# Patient Record
Sex: Female | Born: 1950 | Race: White | Hispanic: Yes | Marital: Married | Smoking: Never smoker
Health system: Southern US, Community
[De-identification: ages and names within clinical notes are randomized; demographics above are authoritative.]

## PROBLEM LIST (undated history)

## (undated) DIAGNOSIS — E119 Type 2 diabetes mellitus without complications: Secondary | ICD-10-CM

## (undated) DIAGNOSIS — I10 Essential (primary) hypertension: Secondary | ICD-10-CM

## (undated) DIAGNOSIS — N289 Disorder of kidney and ureter, unspecified: Secondary | ICD-10-CM

## (undated) DIAGNOSIS — F0390 Unspecified dementia without behavioral disturbance: Secondary | ICD-10-CM

## (undated) HISTORY — PX: ABDOMINAL HYSTERECTOMY: SHX81

## (undated) HISTORY — PX: CHOLECYSTECTOMY: SHX55

## (undated) HISTORY — PX: NEPHRECTOMY TRANSPLANTED ORGAN: SUR880

---

## 2012-03-25 ENCOUNTER — Inpatient Hospital Stay (HOSPITAL_COMMUNITY)
Admission: EM | Admit: 2012-03-25 | Discharge: 2012-03-28 | DRG: 865 | Disposition: A | Discharge status: Home / Self Care | Attending: Internal Medicine | Admitting: Internal Medicine

## 2012-03-25 ENCOUNTER — Encounter (HOSPITAL_COMMUNITY): Payer: Self-pay | Admitting: Emergency Medicine

## 2012-03-25 ENCOUNTER — Emergency Department (HOSPITAL_COMMUNITY)

## 2012-03-25 DIAGNOSIS — R197 Diarrhea, unspecified: Secondary | ICD-10-CM

## 2012-03-25 DIAGNOSIS — A419 Sepsis, unspecified organism: Secondary | ICD-10-CM

## 2012-03-25 DIAGNOSIS — E785 Hyperlipidemia, unspecified: Secondary | ICD-10-CM | POA: Diagnosis present

## 2012-03-25 DIAGNOSIS — I10 Essential (primary) hypertension: Secondary | ICD-10-CM

## 2012-03-25 DIAGNOSIS — Z79899 Other long term (current) drug therapy: Secondary | ICD-10-CM

## 2012-03-25 DIAGNOSIS — A088 Other specified intestinal infections: Secondary | ICD-10-CM | POA: Diagnosis present

## 2012-03-25 DIAGNOSIS — A0472 Enterocolitis due to Clostridium difficile, not specified as recurrent: Secondary | ICD-10-CM | POA: Diagnosis present

## 2012-03-25 DIAGNOSIS — Z90710 Acquired absence of both cervix and uterus: Secondary | ICD-10-CM

## 2012-03-25 DIAGNOSIS — B9789 Other viral agents as the cause of diseases classified elsewhere: Principal | ICD-10-CM | POA: Diagnosis present

## 2012-03-25 DIAGNOSIS — Z794 Long term (current) use of insulin: Secondary | ICD-10-CM

## 2012-03-25 DIAGNOSIS — Z940 Kidney transplant status: Secondary | ICD-10-CM

## 2012-03-25 DIAGNOSIS — R031 Nonspecific low blood-pressure reading: Secondary | ICD-10-CM | POA: Diagnosis present

## 2012-03-25 DIAGNOSIS — E119 Type 2 diabetes mellitus without complications: Secondary | ICD-10-CM

## 2012-03-25 DIAGNOSIS — F0390 Unspecified dementia without behavioral disturbance: Secondary | ICD-10-CM | POA: Diagnosis present

## 2012-03-25 DIAGNOSIS — R509 Fever, unspecified: Secondary | ICD-10-CM

## 2012-03-25 DIAGNOSIS — A084 Viral intestinal infection, unspecified: Secondary | ICD-10-CM | POA: Diagnosis present

## 2012-03-25 DIAGNOSIS — R112 Nausea with vomiting, unspecified: Secondary | ICD-10-CM

## 2012-03-25 DIAGNOSIS — Z9089 Acquired absence of other organs: Secondary | ICD-10-CM

## 2012-03-25 HISTORY — DX: Essential (primary) hypertension: I10

## 2012-03-25 HISTORY — DX: Type 2 diabetes mellitus without complications: E11.9

## 2012-03-25 HISTORY — DX: Unspecified dementia, unspecified severity, without behavioral disturbance, psychotic disturbance, mood disturbance, and anxiety: F03.90

## 2012-03-25 HISTORY — DX: Disorder of kidney and ureter, unspecified: N28.9

## 2012-03-25 LAB — COMPREHENSIVE METABOLIC PANEL
ALT: 21 U/L (ref 0–35)
Albumin: 3.7 g/dL (ref 3.5–5.2)
Alkaline Phosphatase: 98 U/L (ref 39–117)
BUN: 14 mg/dL (ref 6–23)
Calcium: 9.2 mg/dL (ref 8.4–10.5)
GFR calc Af Amer: >90 mL/min (ref >90)
Potassium: 3.8 mEq/L (ref 3.5–5.1)
Sodium: 141 mEq/L (ref 135–145)
Total Protein: 6.9 g/dL (ref 6.0–8.3)

## 2012-03-25 LAB — URINALYSIS, MICROSCOPIC ONLY
Ketones, ur: NEGATIVE mg/dL
Leukocytes, UA: NEGATIVE
Nitrite: NEGATIVE
Protein, ur: NEGATIVE mg/dL
Urobilinogen, UA: 0.2 mg/dL (ref 0.0–1.0)

## 2012-03-25 LAB — CBC WITH DIFFERENTIAL/PLATELET
Basophils Relative: 0 % (ref 0–1)
Eosinophils Absolute: 0.1 10*3/uL (ref 0.0–0.7)
Eosinophils Relative: 1 % (ref 0–5)
MCH: 26.6 pg (ref 26.0–34.0)
MCHC: 33.3 g/dL (ref 30.0–36.0)
MCV: 80 fL (ref 78.0–100.0)
Neutrophils Relative %: 93 % — ABNORMAL HIGH (ref 43–77)
Platelets: 205 10*3/uL (ref 150–400)

## 2012-03-25 LAB — LIPASE, BLOOD: Lipase: 12 U/L (ref 11–59)

## 2012-03-25 MED ORDER — ACETAMINOPHEN 325 MG PO TABS
650.0000 mg | ORAL_TABLET | Freq: Four times a day (QID) | ORAL | Status: DC | PRN
  Administered 2012-03-25: 650 mg via ORAL
  Filled 2012-03-25: qty 2

## 2012-03-25 MED ORDER — HYDROCORTISONE SOD SUCCINATE 100 MG IJ SOLR
100.0000 mg | Freq: Once | INTRAMUSCULAR | Status: AC
  Administered 2012-03-26: 100 mg via INTRAVENOUS

## 2012-03-25 MED ORDER — ONDANSETRON HCL 4 MG/2ML IJ SOLN
4.0000 mg | Freq: Once | INTRAMUSCULAR | Status: AC
  Administered 2012-03-25: 4 mg via INTRAVENOUS
  Filled 2012-03-25: qty 2

## 2012-03-25 MED ORDER — HYDROCORTISONE SOD SUCCINATE 100 MG PF FOR IT USE: 100.0000 mg | Freq: Once | INTRAMUSCULAR | Status: DC

## 2012-03-25 MED ORDER — HYDROCORTISONE SOD SUCCINATE 100 MG IJ SOLR
100.0000 mg | Freq: Once | INTRAMUSCULAR | Status: DC
  Filled 2012-03-25: qty 2

## 2012-03-25 MED ORDER — VANCOMYCIN HCL IN DEXTROSE 1-5 GM/200ML-% IV SOLN
1000.0000 mg | Freq: Once | INTRAVENOUS | Status: AC
  Administered 2012-03-25: 1000 mg via INTRAVENOUS
  Filled 2012-03-25: qty 200

## 2012-03-25 MED ORDER — SODIUM CHLORIDE 0.9 % IV SOLN
Freq: Once | INTRAVENOUS | Status: AC
  Administered 2012-03-25: 75 mL/h via INTRAVENOUS

## 2012-03-25 MED ORDER — PIPERACILLIN-TAZOBACTAM 3.375 G IVPB
3.3750 g | Freq: Once | INTRAVENOUS | Status: AC
  Administered 2012-03-25: 3.375 g via INTRAVENOUS
  Filled 2012-03-25: qty 50

## 2012-03-25 MED ORDER — METRONIDAZOLE IN NACL 5-0.79 MG/ML-% IV SOLN
500.0000 mg | Freq: Once | INTRAVENOUS | Status: AC
  Administered 2012-03-25: 500 mg via INTRAVENOUS
  Filled 2012-03-25: qty 100

## 2012-03-25 NOTE — ED Notes (Signed)
Bed:WHALA<BR> Expected date:<BR> Expected time:<BR> Means of arrival:<BR> Comments:<BR> Ems/ hold weakness

## 2012-03-25 NOTE — ED Provider Notes (Signed)
History     CSN: [id number]  Arrival date & time 03/25/12  1916   First MD Initiated Contact with Patient 03/25/12 2026      Chief Complaint  Patient presents with  . Nausea  . Emesis  . Diarrhea    (Consider location/radiation/quality/duration/timing/severity/associated sxs/prior treatment) HPI Comments: The patient has a history of dementia, level V caveat applies  According to the spouse the patient has a history of diabetes, hypertension, kidney failure status post renal transplant. The patient has been having diarrhea for approximately 3 days, is having increased weakness, nothing seems to make it better or worse, she was noted to be severely hypertensive and tachycardic with a fever of 104.5 on arrival. The patient refuses to give any other information verbally, she is compliant with the exam but not giving any verbal history. Most of her care has occurred at [facility] per her husband report.  The husband reports that she may have recently been admitted to the hospital, he does not remember the exact dates, does not remember she has been on antibiotics. There is very little description of what the diarrhea looks like, the husband states that he has a memory problem as well  Patient is a 62 y.o. female presenting with vomiting and diarrhea. The history is provided by the patient and the spouse.  Emesis Associated symptoms: diarrhea   Diarrhea Associated symptoms: vomiting     Past Medical History  Diagnosis Date  . Diabetes mellitus without complication   . Hypertension   . Dementia   . Renal disorder     Past Surgical History  Procedure Laterality Date  . Nephrectomy transplanted organ      History reviewed. No pertinent family history.  History  Substance Use Topics  . Smoking status: Never Smoker   . Smokeless tobacco: Not on file  . Alcohol Use: No    OB History   Grav Para Term Preterm Abortions TAB SAB Ect Mult Living                Review of Systems  Unable to perform ROS: Dementia  Gastrointestinal: Positive for vomiting and diarrhea.    Allergies  Review of patient's allergies indicates no known allergies.  Home Medications   Current Outpatient Rx  Name  Route  Sig  Dispense  Refill  . Cholecalciferol (VITAMIN D-3 PO)   Oral   Take 1 tablet by mouth 2 (two) times daily.         . fludrocortisone (FLORINEF) 0.1 MG tablet   Oral   Take 0.1 mg by mouth daily.         Marland Kitchen gabapentin (NEURONTIN) 100 MG capsule   Oral   Take 200 mg by mouth 2 (two) times daily.         . insulin aspart (NOVOLOG) 100 UNIT/ML injection   Subcutaneous   Inject 0-20 Units into the skin 4 (four) times daily - after meals and at bedtime. Sliding scale.         . insulin glargine (LANTUS) 100 UNIT/ML injection   Subcutaneous   Inject 30 Units into the skin at bedtime. At 1700.         . magnesium oxide (MAG-OX) 400 MG tablet   Oral   Take 800 mg by mouth 2 (two) times daily.         . predniSONE (DELTASONE) 5 MG tablet   Oral   Take 5 mg by mouth every morning.         Marland Kitchen  simvastatin (ZOCOR) 10 MG tablet   Oral   Take 10 mg by mouth at bedtime.         . tacrolimus (PROGRAF) 0.5 MG capsule   Oral   Take 0.5 mg by mouth 2 (two) times daily.           BP 94/40  Pulse 91  Temp(Src) 103.5 F (39.7 C) (Rectal)  Resp 18  SpO2 99%  Physical Exam  Nursing note and vitals reviewed. Constitutional: She appears well-developed and well-nourished. No distress.  HENT:  Head: Normocephalic and atraumatic.  Mouth/Throat: Oropharynx is clear and moist. No oropharyngeal exudate.  Eyes: Conjunctivae and EOM are normal. Pupils are equal, round, and reactive to light. Right eye exhibits no discharge. Left eye exhibits no discharge. No scleral icterus.  Neck: Normal range of motion. Neck supple. No JVD present. No thyromegaly present.  Cardiovascular: Regular rhythm, normal heart sounds and intact  distal pulses.  Exam reveals no gallop and no friction rub.   No murmur heard. Tachycardia  Pulmonary/Chest: Effort normal and breath sounds normal. No respiratory distress. She has no wheezes. She has no rales.  Abdominal: Soft. She exhibits no distension and no mass. There is tenderness ( b diffuse mild tenderness, no guarding, no peritoneal signs, increased bowel sounds).  Musculoskeletal: Normal range of motion. She exhibits no edema and no tenderness.  Lymphadenopathy:    She has no cervical adenopathy.  Neurological: She is alert. Coordination normal.  Skin: Skin is warm and dry. No rash noted. No erythema.  Psychiatric: She has a normal mood and affect. Her behavior is normal.    ED Course  Procedures (including critical care time)  Labs Reviewed  CBC WITH DIFFERENTIAL - Abnormal; Notable for the following:    RBC 5.34 (*)    Neutrophils Relative 93 (*)    Neutro Abs 9.4 (*)    Lymphocytes Relative 3 (*)    Lymphs Abs 0.3 (*)    All other components within normal limits  COMPREHENSIVE METABOLIC PANEL - Abnormal; Notable for the following:    Glucose, Bld 198 (*)    All other components within normal limits  CULTURE, BLOOD (ROUTINE X 2)  CULTURE, BLOOD (ROUTINE X 2)  CLOSTRIDIUM DIFFICILE BY PCR  URINALYSIS, MICROSCOPIC ONLY  LIPASE, BLOOD  LACTIC ACID, PLASMA   Dg Chest Port 1 View  03/25/2012  *RADIOLOGY REPORT*  Clinical Data: Fever, weakness, chest pain.  PORTABLE CHEST - 1 VIEW  Comparison: None.  Findings: Heart size is accentuated by portable technique but is probably mildly enlarged.  There are perihilar bronchitic changes. No edema.  No focal consolidations or pleural effusions identified. There are degenerative changes in the spine.  IMPRESSION:  1.  Cardiomegaly. 2. No evidence for acute pulmonary abnormality.   Original Report Authenticated By: Norva Pavlov, M.D.      1. Sepsis   2. Fever   3. Diarrhea       MDM  The patient interacts very little  with the examiner, she does follow commands, she is able to sit up, she is not give any history regarding her illness but not when asked if she has diarrhea. She had been on peritoneal dialysis for a short period but then had a renal transplant which the husband states has been functional for greater than 10 years without any difficulties. She does have a very high fever by rectal check of over 104. Her pulse has reduced to 105, she is hypertensive and has oxygen saturations  between 93 and 96%. She has clear lung sounds, x-ray, labs, fluids, fever control, anticipate admission, no obvious source other than diarrhea, C. difficile testing pending   The patient is persistently tachycardic, febrile despite antipyretics and fluid resuscitation. She has a normal lactic acid, a normal white blood cell count of 10,000 and normal renal function. Urinalysis collected without any signs of infection, chest x-ray without signs of infection. The patient is likely septic from Clostridium difficile, antibiotic started, broad-spectrum metabolic ordered as well secondary to the patient's immune compromised status.  Critical care delivered for the septic patient, there is no signs of hypotensive shock at this time  CRITICAL CARE Performed by: Vida Roller   Total critical care time: 35  Critical care time was exclusive of separately billable procedures and treating other patients.  Critical care was necessary to treat or prevent imminent or life-threatening deterioration.  Critical care was time spent personally by me on the following activities: development of treatment plan with patient and/or surrogate as well as nursing, discussions with consultants, evaluation of patient's response to treatment, examination of patient, obtaining history from patient or surrogate, ordering and performing treatments and interventions, ordering and review of laboratory studies, ordering and review of radiographic studies, pulse  oximetry and re-evaluation of patient's condition.      Vida Roller, MD 03/25/12 [phone]

## 2012-03-25 NOTE — ED Notes (Signed)
bp decreased upon return to room.  Fluids opened.  Pt positioned head down.

## 2012-03-25 NOTE — ED Notes (Signed)
Pt assisted to bathroom.  Unable to obtain urine without stool.  Taken back to room and In/out cath completed.

## 2012-03-25 NOTE — ED Notes (Signed)
Per EMS, pt. Is from home with complaint of nausea, vomiting and diarrhea x 3days. Pt. Has dementia but oriented x3 with EMS. Husband reported that pt. Has been weak and called EMS.

## 2012-03-26 ENCOUNTER — Encounter (HOSPITAL_COMMUNITY): Payer: Self-pay | Admitting: Internal Medicine

## 2012-03-26 DIAGNOSIS — E785 Hyperlipidemia, unspecified: Secondary | ICD-10-CM | POA: Diagnosis present

## 2012-03-26 DIAGNOSIS — E119 Type 2 diabetes mellitus without complications: Secondary | ICD-10-CM

## 2012-03-26 DIAGNOSIS — I10 Essential (primary) hypertension: Secondary | ICD-10-CM

## 2012-03-26 LAB — GLUCOSE, CAPILLARY
Glucose-Capillary: 209 mg/dL — ABNORMAL HIGH (ref 70–99)
Glucose-Capillary: 234 mg/dL — ABNORMAL HIGH (ref 70–99)
Glucose-Capillary: 250 mg/dL — ABNORMAL HIGH (ref 70–99)

## 2012-03-26 LAB — CBC WITH DIFFERENTIAL/PLATELET
Basophils Absolute: 0 10*3/uL (ref 0.0–0.1)
HCT: 39.5 % (ref 36.0–46.0)
Hemoglobin: 13 g/dL (ref 12.0–15.0)
Lymphocytes Relative: 3 % — ABNORMAL LOW (ref 12–46)
Lymphs Abs: 0.2 10*3/uL — ABNORMAL LOW (ref 0.7–4.0)
Monocytes Absolute: 0.2 10*3/uL (ref 0.1–1.0)
Monocytes Relative: 2 % — ABNORMAL LOW (ref 3–12)
Neutro Abs: 8.3 10*3/uL — ABNORMAL HIGH (ref 1.7–7.7)
RBC: 4.92 MIL/uL (ref 3.87–5.11)
WBC: 8.8 10*3/uL (ref 4.0–10.5)

## 2012-03-26 LAB — COMPREHENSIVE METABOLIC PANEL
ALT: 15 U/L (ref 0–35)
AST: 16 U/L (ref 0–37)
Albumin: 3 g/dL — ABNORMAL LOW (ref 3.5–5.2)
Calcium: 8.2 mg/dL — ABNORMAL LOW (ref 8.4–10.5)
Creatinine, Ser: 0.76 mg/dL (ref 0.50–1.10)
Sodium: 136 mEq/L (ref 135–145)

## 2012-03-26 LAB — INFLUENZA PANEL BY PCR (TYPE A & B): H1N1 flu by pcr: NOT DETECTED

## 2012-03-26 LAB — LACTIC ACID, PLASMA: Lactic Acid, Venous: 1.1 mmol/L (ref 0.5–2.2)

## 2012-03-26 MED ORDER — INSULIN GLARGINE 100 UNIT/ML ~~LOC~~ SOLN
30.0000 [IU] | Freq: Every day | SUBCUTANEOUS | Status: DC
  Administered 2012-03-26 – 2012-03-27 (×2): 30 [IU] via SUBCUTANEOUS

## 2012-03-26 MED ORDER — SODIUM CHLORIDE 0.9 % IV SOLN: INTRAVENOUS | Status: AC

## 2012-03-26 MED ORDER — ONDANSETRON HCL 4 MG/2ML IJ SOLN
4.0000 mg | Freq: Four times a day (QID) | INTRAMUSCULAR | Status: DC | PRN
  Filled 2012-03-26: qty 2

## 2012-03-26 MED ORDER — FLUDROCORTISONE ACETATE 0.1 MG PO TABS
0.1000 mg | ORAL_TABLET | Freq: Every day | ORAL | Status: DC
  Administered 2012-03-26 – 2012-03-27 (×2): 0.1 mg via ORAL
  Filled 2012-03-26 (×3): qty 1

## 2012-03-26 MED ORDER — INSULIN GLARGINE 100 UNIT/ML ~~LOC~~ SOLN
20.0000 [IU] | Freq: Every day | SUBCUTANEOUS | Status: DC
  Administered 2012-03-26: 20 [IU] via SUBCUTANEOUS

## 2012-03-26 MED ORDER — PIPERACILLIN-TAZOBACTAM 3.375 G IVPB
3.3750 g | Freq: Three times a day (TID) | INTRAVENOUS | Status: DC
  Administered 2012-03-26 – 2012-03-28 (×7): 3.375 g via INTRAVENOUS
  Filled 2012-03-26 (×9): qty 50

## 2012-03-26 MED ORDER — ONDANSETRON HCL 4 MG/2ML IJ SOLN: 4.0000 mg | Freq: Three times a day (TID) | INTRAMUSCULAR | Status: DC | PRN

## 2012-03-26 MED ORDER — MAGNESIUM OXIDE 400 (241.3 MG) MG PO TABS
800.0000 mg | ORAL_TABLET | Freq: Two times a day (BID) | ORAL | Status: DC
  Administered 2012-03-26 – 2012-03-28 (×5): 800 mg via ORAL
  Filled 2012-03-26 (×8): qty 2

## 2012-03-26 MED ORDER — ACETAMINOPHEN 650 MG RE SUPP: 650.0000 mg | Freq: Four times a day (QID) | RECTAL | Status: DC | PRN

## 2012-03-26 MED ORDER — TACROLIMUS 0.5 MG PO CAPS
0.5000 mg | ORAL_CAPSULE | Freq: Two times a day (BID) | ORAL | Status: DC
  Administered 2012-03-26 – 2012-03-28 (×4): 0.5 mg via ORAL
  Filled 2012-03-26 (×7): qty 1

## 2012-03-26 MED ORDER — GABAPENTIN 100 MG PO CAPS
200.0000 mg | ORAL_CAPSULE | Freq: Two times a day (BID) | ORAL | Status: DC
  Administered 2012-03-26 – 2012-03-28 (×5): 200 mg via ORAL
  Filled 2012-03-26 (×6): qty 2

## 2012-03-26 MED ORDER — SODIUM CHLORIDE 0.9 % IJ SOLN
3.0000 mL | Freq: Two times a day (BID) | INTRAMUSCULAR | Status: DC
  Administered 2012-03-26 – 2012-03-28 (×3): 3 mL via INTRAVENOUS

## 2012-03-26 MED ORDER — ENOXAPARIN SODIUM 40 MG/0.4ML ~~LOC~~ SOLN
40.0000 mg | SUBCUTANEOUS | Status: DC
  Administered 2012-03-26 – 2012-03-27 (×2): 40 mg via SUBCUTANEOUS
  Filled 2012-03-26 (×3): qty 0.4

## 2012-03-26 MED ORDER — ONDANSETRON HCL 4 MG PO TABS: 4.0000 mg | ORAL_TABLET | Freq: Four times a day (QID) | ORAL | Status: DC | PRN

## 2012-03-26 MED ORDER — INSULIN ASPART 100 UNIT/ML ~~LOC~~ SOLN
0.0000 [IU] | Freq: Three times a day (TID) | SUBCUTANEOUS | Status: DC
  Administered 2012-03-26: 2 [IU] via SUBCUTANEOUS
  Administered 2012-03-26 (×2): 3 [IU] via SUBCUTANEOUS
  Administered 2012-03-27 (×2): 2 [IU] via SUBCUTANEOUS

## 2012-03-26 MED ORDER — ACETAMINOPHEN 325 MG PO TABS
650.0000 mg | ORAL_TABLET | Freq: Four times a day (QID) | ORAL | Status: DC | PRN
  Administered 2012-03-26 – 2012-03-28 (×3): 650 mg via ORAL
  Filled 2012-03-26 (×3): qty 2

## 2012-03-26 MED ORDER — VANCOMYCIN HCL 500 MG IV SOLR
500.0000 mg | Freq: Two times a day (BID) | INTRAVENOUS | Status: DC
  Administered 2012-03-26 – 2012-03-28 (×4): 500 mg via INTRAVENOUS
  Filled 2012-03-26 (×7): qty 500

## 2012-03-26 MED ORDER — SIMVASTATIN 10 MG PO TABS
10.0000 mg | ORAL_TABLET | Freq: Every day | ORAL | Status: DC
  Administered 2012-03-26 – 2012-03-27 (×3): 10 mg via ORAL
  Filled 2012-03-26 (×4): qty 1

## 2012-03-26 MED ORDER — SODIUM CHLORIDE 0.9 % IV SOLN
INTRAVENOUS | Status: DC
  Administered 2012-03-26: 07:00:00 via INTRAVENOUS
  Administered 2012-03-26: 150 mL/h via INTRAVENOUS

## 2012-03-26 MED ORDER — MAGNESIUM SULFATE 40 MG/ML IJ SOLN
2.0000 g | Freq: Once | INTRAMUSCULAR | Status: AC
  Administered 2012-03-26: 2 g via INTRAVENOUS
  Filled 2012-03-26: qty 50

## 2012-03-26 MED ORDER — HYDROCORTISONE SOD SUCCINATE 100 MG IJ SOLR
50.0000 mg | Freq: Four times a day (QID) | INTRAMUSCULAR | Status: DC
  Administered 2012-03-26 – 2012-03-27 (×5): 50 mg via INTRAVENOUS
  Filled 2012-03-26 (×9): qty 1

## 2012-03-26 NOTE — ED Notes (Signed)
MD at bedside. 

## 2012-03-26 NOTE — Progress Notes (Addendum)
[facility] PROGRESS NOTE  Emma Maldonado OZD:[id number] DOB: May 16, 1950 DOA: 03/25/2012 PCP: Elizebeth Koller  Brief narrative  62 year old Hispanic female with history all within a transplant in 97, on immunosuppressants (follows at [facility]), type 2 diabetes mellitus and hyperlipidemia admitted for 2-3 days off nausea and vomiting with diarrhea and generalized abdominal pain and fever of 104.46F in the ED. Patient also had low systolic blood pressure of 90. Patient admitted to step down for close monitoring for sepsis.   Assessment/Plan: Sepsis Most likely secondary to gastroenteritis possibly viral versus C. difficile. Cannot rule out flu. Influenza panel pending. C. difficile ordered however has not had a bowel movement since yesterday. Continue with empiric antibiotics including IV vancomycin, Zosyn and Flagyl. Continue fluid and contact precautions. Blood pressure stable with aggressive IV hydration and stress dose steroid be continued. -Abdominal exam is benign at this time. I will hold off on CT of the abdomen for now. However given history of renal transplant her keep a low threshold on imaging her abdomen. -Her labs including CBC, BMPET., LFTs and lactate are normal. Follow blood culture, UA unremarkable. -Low magnesium replenished  History of renal transplant Follows at [facility] Continue Prograf. Patient is also on prednisone at home and currently getting stress dose hydrocortisone. Will transition to her oral prednisone once stable. -Renal function currently stable  Type 2 diabetes mellitus Continue home dose Lantus Continue sliding scale insulin  Hyperlipidemia Continue statin  DVT prophylaxis  Diet: Diabetic      Code Status: Full code Family Communication: No one at bedside Disposition Plan: Home once stable   Consultants:  None  Procedures:  None  Antibiotics:  IV vancomycin and Zosyn  HPI/Subjective: Admission H&P reviewed. Patient  denies any headache, blurry vision, shortness of breath, abdominal pain, diarrhea, nausea or vomiting at this time. Denies any dysuria. Last bowel movement was yesterday afternoon.  Objective: Filed Vitals:   03/26/12 0300 03/26/12 0400 03/26/12 0500 03/26/12 0600  BP:  154/79 124/61 120/55  Pulse: 73 71 68 68  Temp:  98.1 F (36.7 C)    TempSrc:  Oral    Resp: 20 19 19 17   Height:      Weight:   59.5 kg (131 lb 2.8 oz)   SpO2: 92% 93% 94% 95%    Intake/Output Summary (Last 24 hours) at 03/26/12 0807 Last data filed at 03/26/12 0631  Gross per 24 hour  Intake    925 ml  Output      0 ml  Net    925 ml   Filed Weights   03/26/12 0110 03/26/12 0500  Weight: 59.5 kg (131 lb 2.8 oz) 59.5 kg (131 lb 2.8 oz)    Exam:   General:  Elderly female lying in bed in no acute distress  HEENT: No pallor, no icterus, moist oral mucosa  Cardiovascular: Normal S1 and S2, no murmurs rub or gallop  Respiratory: Right basilar crackles, no rhonchi or wheeze  Abdomen: Soft, nondistended, bowel sounds present, nontender, no organomegaly, no CVA tenderness  Musculoskeletal: Warm, no edema  CNS: AAO x3, no neck rigidity   Data Reviewed: Basic Metabolic Panel:  Recent Labs Lab 03/25/12 1943 03/26/12 0357  NA 141 136  K 3.8 3.7  CL 106 105  CO2 25 22  GLUCOSE 198* 256*  BUN 14 15  CREATININE 0.68 0.76  CALCIUM 9.2 8.2*  MG  --  1.4*   Liver Function Tests:  Recent Labs Lab 03/25/12 1943 03/26/12 0357  AST 26 16  ALT 21 15  ALKPHOS 98 76  BILITOT 1.1 1.4*  PROT 6.9 5.7*  ALBUMIN 3.7 3.0*    Recent Labs Lab 03/25/12 1943  LIPASE 12   No results found for this basename: AMMONIA,  in the last 168 hours CBC:  Recent Labs Lab 03/25/12 1943 03/26/12 0357  WBC 10.0 8.8  NEUTROABS 9.4* 8.3*  HGB 14.2 13.0  HCT 42.7 39.5  MCV 80.0 80.3  PLT 205 202   Cardiac Enzymes:  Recent Labs Lab 03/26/12 0357  TROPONINI <0.30   BNP (last 3 results) No results  found for this basename: PROBNP,  in the last 8760 hours CBG:  Recent Labs Lab 03/26/12 0220  GLUCAP 209*    Recent Results (from the past 240 hour(s))  MRSA PCR SCREENING     Status: None   Collection Time    03/26/12  1:10 AM      Result Value Range Status   MRSA by PCR NEGATIVE  NEGATIVE Final   Comment:            The GeneXpert MRSA Assay (FDA     approved for NASAL specimens     only), is one component of a     comprehensive MRSA colonization     surveillance program. It is not     intended to diagnose MRSA     infection nor to guide or     monitor treatment for     MRSA infections.     Studies: Dg Chest Port 1 View  03/25/2012  *RADIOLOGY REPORT*  Clinical Data: Fever, weakness, chest pain.  PORTABLE CHEST - 1 VIEW  Comparison: None.  Findings: Heart size is accentuated by portable technique but is probably mildly enlarged.  There are perihilar bronchitic changes. No edema.  No focal consolidations or pleural effusions identified. There are degenerative changes in the spine.  IMPRESSION:  1.  Cardiomegaly. 2. No evidence for acute pulmonary abnormality.   Original Report Authenticated By: Norva Pavlov, M.D.     Scheduled Meds: . sodium chloride   Intravenous STAT  . sodium chloride   Intravenous STAT  . enoxaparin (LOVENOX) injection  40 mg Subcutaneous Q24H  . fludrocortisone  0.1 mg Oral Daily  . gabapentin  200 mg Oral BID  . hydrocortisone sod succinate (SOLU-CORTEF) injection  50 mg Intravenous Q6H  . insulin aspart  0-9 Units Subcutaneous TID WC  . insulin glargine  20 Units Subcutaneous QHS  . magnesium oxide  800 mg Oral BID  . piperacillin-tazobactam (ZOSYN)  IV  3.375 g Intravenous Q8H  . simvastatin  10 mg Oral QHS  . sodium chloride  3 mL Intravenous Q12H  . tacrolimus  0.5 mg Oral BID  . vancomycin  500 mg Intravenous Q12H   Continuous Infusions: . sodium chloride 150 mL/hr at 03/26/12 0631      Time spent: 25 minutes    Leeum Sankey,  Shavonta Gossen  [facility] Pager [phone] If 7PM-7AM, please contact night-coverage at [email or web address], password [facility] 03/26/2012, 8:07 AM  LOS: 1 day

## 2012-03-26 NOTE — H&P (Signed)
[facility] History and Physical  Sha Burling RUE:[id number] DOB: 1951/01/03 DOA: 03/25/2012  Referring physician: Dr. Hyacinth Meeker. PCP: Elizebeth Koller  Specialists: None.  Chief Complaint: Weakness.  HPI: Emma Maldonado is a 62 y.o. female with history of renal transplant on immunosuppressant, diabetes mellitus2, hyperlipidemia was brought to the ER because of patient being found to be very weak. Patient states that over the last 2-3 days patient has been having nausea vomiting and diarrhea with generalized abdominal discomfort. Denies having used any recent antibiotics or has not been hospitalized. Denies any blood in the vomitus or diarrhea. Has not had any recent travel and no one else in the house is sick. Patient also has been having subjective feeling of fever and chills. Denies any chest pain productive cough. Has chronic headache which patient states is not new. In the ER patient was found to be febrile with temperatures around 104F. Patient's lab work were pretty unremarkable the chest x-ray and UA within normal limits. Patient has had diarrhea in the ER which was nonbloody. Patient will pressure was fluctuating and has dropped below 90. Patient has been given normal saline bolus, hydrocortisone stress dose, started on empiric antibiotics vancomycin and Zosyn for possible sepsis after blood cultures and urine cultures were obtained. Patient will be admitted for further management.  Review of Systems: As presented in the history of presenting illness nothing else significant.   Past Medical History  Diagnosis Date  . Diabetes mellitus without complication   . Hypertension   . Dementia   . Renal disorder    Past Surgical History  Procedure Laterality Date  . Nephrectomy transplanted organ    . Abdominal hysterectomy    . Cholecystectomy     Social History:  reports that she has never smoked. She does not have any smokeless tobacco history on file. She reports that she does not drink  alcohol or use illicit drugs.  lives at home with husband.  where does patient live--  can do ADLs.  Can patient participate in ADLs?  No Known Allergies  Family History  Problem Relation Age of Onset  . Diabetes Mellitus II Father       Prior to Admission medications   Medication Sig Start Date End Date Taking? Authorizing Provider  Cholecalciferol (VITAMIN D-3 PO) Take 1 tablet by mouth 2 (two) times daily.   Yes Historical Provider, MD  fludrocortisone (FLORINEF) 0.1 MG tablet Take 0.1 mg by mouth daily.   Yes Historical Provider, MD  gabapentin (NEURONTIN) 100 MG capsule Take 200 mg by mouth 2 (two) times daily.   Yes Historical Provider, MD  insulin aspart (NOVOLOG) 100 UNIT/ML injection Inject 0-20 Units into the skin 4 (four) times daily - after meals and at bedtime. Sliding scale.   Yes Historical Provider, MD  insulin glargine (LANTUS) 100 UNIT/ML injection Inject 30 Units into the skin at bedtime. At 1700.   Yes Historical Provider, MD  magnesium oxide (MAG-OX) 400 MG tablet Take 800 mg by mouth 2 (two) times daily.   Yes Historical Provider, MD  predniSONE (DELTASONE) 5 MG tablet Take 5 mg by mouth every morning.   Yes Historical Provider, MD  simvastatin (ZOCOR) 10 MG tablet Take 10 mg by mouth at bedtime.   Yes Historical Provider, MD  tacrolimus (PROGRAF) 0.5 MG capsule Take 0.5 mg by mouth 2 (two) times daily.   Yes Historical Provider, MD   Physical Exam: Filed Vitals:   03/25/12 2330 03/25/12 2345 03/26/12 0000 03/26/12 0015  BP: 96/39 83/54 97/45  95/44  Pulse: 89 87 79 84  Temp:      TempSrc:      Resp: 18 25 22 20   SpO2: 100% 100% 97% 99%     General:   well-developed and nourished.  Eyes:  anicteric no pallor.  ENT:  no discharge from the ears eyes nose or mouth.   Neck:  no JVD or mass.  Cardiovascular: S1-S2 heard regular.  Respiratory: No rhonchi no crepitations.   Abdomen:  soft nontender bowel sounds present. Skin discoloration from insulin  injections.   Skin:  skin discoloration from insulin injections.  Musculoskeletal: No edema no effusions.  Psychiatric: Appears normal.  Neurologic:  alert awake oriented to time place and person moves all extremities.   Labs on Admission:  Basic Metabolic Panel:  Recent Labs Lab 03/25/12 1943  NA 141  K 3.8  CL 106  CO2 25  GLUCOSE 198*  BUN 14  CREATININE 0.68  CALCIUM 9.2   Liver Function Tests:  Recent Labs Lab 03/25/12 1943  AST 26  ALT 21  ALKPHOS 98  BILITOT 1.1  PROT 6.9  ALBUMIN 3.7    Recent Labs Lab 03/25/12 1943  LIPASE 12   No results found for this basename: AMMONIA,  in the last 168 hours CBC:  Recent Labs Lab 03/25/12 1943  WBC 10.0  NEUTROABS 9.4*  HGB 14.2  HCT 42.7  MCV 80.0  PLT 205   Cardiac Enzymes: No results found for this basename: CKTOTAL, CKMB, CKMBINDEX, TROPONINI,  in the last 168 hours  BNP (last 3 results) No results found for this basename: PROBNP,  in the last 8760 hours CBG: No results found for this basename: GLUCAP,  in the last 168 hours  Radiological Exams on Admission: Dg Chest Port 1 View  03/25/2012  *RADIOLOGY REPORT*  Clinical Data: Fever, weakness, chest pain.  PORTABLE CHEST - 1 VIEW  Comparison: None.  Findings: Heart size is accentuated by portable technique but is probably mildly enlarged.  There are perihilar bronchitic changes. No edema.  No focal consolidations or pleural effusions identified. There are degenerative changes in the spine.  IMPRESSION:  1.  Cardiomegaly. 2. No evidence for acute pulmonary abnormality.   Original Report Authenticated By: Norva Pavlov, M.D.      Assessment/Plan Principal Problem:   Sepsis Active Problems:   History of renal transplant   Diabetes mellitus   Hyperlipidemia   Nausea vomiting and diarrhea   1. Sepsis - most likely source could be gastrointestinal. C. difficile and stool cultures are been ordered along with blood cultures and urine cultures.  Check influenza panel. Continue aggressive hydration along with stress dose steroids. Continue vancomycin and Zosyn. Closely monitor in step down unit. Since blood pressure was low initially check troponin. May consider CT abdomen and pelvis if source not clear. Presently abdomen on exam appears benign and LFTs are normal. 2. History of renal transplant - continue immunosuppressants. Closely follow metabolic panel intake output. Since patient acutely sick patient has been placed on stress dose steroids which has to be changed to by mouth prednisone home dose once patient is stable. 3. Diabetes mellitus type 2 on insulin - continue Lantus. Since patient's oral intake is poor I have decreased patient's home dose of Lantus from 30 units to 20. Closely follow CBG and titrate insulin requirements. 4. Hyperlipidemia - continue statins.    Code Status:  full code.   Family Communication: patient's husband.   Disposition Plan:  Admit to inpatient.   KAKRAKANDY,ARSHAD N. [facility] Pager [phone].   If 7PM-7AM, please contact night-coverage [email or web address] Password [email or web address] 03/26/2012, 12:35 AM

## 2012-03-26 NOTE — Progress Notes (Signed)
Hypomagnesemia   Mg replaced  

## 2012-03-26 NOTE — Progress Notes (Signed)
ANTIBIOTIC CONSULT NOTE - INITIAL  Pharmacy Consult for Vancomycin + Zosyn Indication: Sepsis  No Known Allergies  Patient Measurements: Height: 5\' 2"  (157.5 cm) Weight: 131 lb 2.8 oz (59.5 kg) IBW/kg (Calculated) : 50.1    Vital Signs: Temp: 100.3 F (37.9 C) (03/15 0110) Temp src: Rectal (03/15 0110) BP: 115/42 mmHg (03/15 0110) Pulse Rate: 81 (03/15 0110) Intake/Output from previous day:   Intake/Output from this shift:    Labs:  Recent Labs  03/25/12 1943  WBC 10.0  HGB 14.2  PLT 205  CREATININE 0.68   Estimated Creatinine Clearance: 57.7 ml/min (by C-G formula based on Cr of 0.68). No results found for this basename: VANCOTROUGH, VANCOPEAK, VANCORANDOM, GENTTROUGH, GENTPEAK, GENTRANDOM, TOBRATROUGH, TOBRAPEAK, TOBRARND, AMIKACINPEAK, AMIKACINTROU, AMIKACIN,  in the last 72 hours   Microbiology: No results found for this or any previous visit (from the past 720 hour(s)).  Medical History: Past Medical History  Diagnosis Date  . Diabetes mellitus without complication   . Hypertension   . Dementia   . Renal disorder     Medications:  Scheduled:  . [COMPLETED] sodium chloride   Intravenous Once  . sodium chloride   Intravenous STAT  . sodium chloride   Intravenous STAT  . enoxaparin (LOVENOX) injection  40 mg Subcutaneous Q24H  . fludrocortisone  0.1 mg Oral Daily  . gabapentin  200 mg Oral BID  . [COMPLETED] hydrocortisone sodium succinate  100 mg Intravenous Once  . hydrocortisone sod succinate (SOLU-CORTEF) injection  50 mg Intravenous Q6H  . insulin aspart  0-9 Units Subcutaneous TID WC  . insulin glargine  20 Units Subcutaneous QHS  . magnesium oxide  800 mg Oral BID  . [COMPLETED] metronidazole  500 mg Intravenous Once  . [COMPLETED] ondansetron (ZOFRAN) IV  4 mg Intravenous Once  . piperacillin-tazobactam (ZOSYN)  IV  3.375 g Intravenous Once  . simvastatin  10 mg Oral QHS  . sodium chloride  3 mL Intravenous Q12H  . tacrolimus  0.5 mg Oral  BID  . [COMPLETED] vancomycin  1,000 mg Intravenous Once  . [DISCONTINUED] hydrocortisone sod succinate (SOLU-CORTEF) injection  100 mg Intravenous Once  . [DISCONTINUED] hydrocortisone sodium succinate  100 mg Intrathecal Once   Infusions:  . sodium chloride     Assessment:  62 yr old female presents with nausea/vomiting/diarrhea.  H/O renal transplant.  Upon arrival, pt found to be febrile with hypertension and tachycardia.    In ED patient received Vancomycin 1gm (@ 23:27 3/14), Zosyn 3.375gm (@ 23:27 3/14) and Flagyl 500mg  (@23 :02 3/14)  Upon admission IV Vancomycin and Zosyn per pharmacy ordered to continue for sepsis  CrCl ~ 57 ml/min  Blood cultures x 2 ordered on 03/25/12  Goal of Therapy:  Vancomycin trough level 15-20 mcg/ml  Plan:  Measure antibiotic drug levels at steady state Follow up culture results Zosyn 3.375gm IV q8hr (each dose infused over 4 hr) Vancomycin 500mg  IV q12hr  Jenasis Straley, Joselyn Glassman, PharmD 03/26/2012,1:28 AM

## 2012-03-27 DIAGNOSIS — I10 Essential (primary) hypertension: Secondary | ICD-10-CM | POA: Diagnosis present

## 2012-03-27 LAB — GLUCOSE, CAPILLARY
Glucose-Capillary: 66 mg/dL — ABNORMAL LOW (ref 70–99)
Glucose-Capillary: 88 mg/dL (ref 70–99)

## 2012-03-27 LAB — HEMOGLOBIN A1C: Hgb A1c MFr Bld: 9 % — ABNORMAL HIGH (ref <5.7)

## 2012-03-27 LAB — CLOSTRIDIUM DIFFICILE BY PCR: Toxigenic C. Difficile by PCR: NEGATIVE

## 2012-03-27 MED ORDER — HYDRALAZINE HCL 25 MG PO TABS
25.0000 mg | ORAL_TABLET | Freq: Four times a day (QID) | ORAL | Status: DC | PRN
  Administered 2012-03-27: 25 mg via ORAL
  Filled 2012-03-27: qty 1

## 2012-03-27 MED ORDER — HYDROCORTISONE SOD SUCCINATE 100 MG IJ SOLR
50.0000 mg | Freq: Two times a day (BID) | INTRAMUSCULAR | Status: DC
  Filled 2012-03-27 (×2): qty 1

## 2012-03-27 MED ORDER — INSULIN GLARGINE 100 UNIT/ML ~~LOC~~ SOLN: 30.0000 [IU] | Freq: Every day | SUBCUTANEOUS | Status: DC

## 2012-03-27 MED ORDER — DIPHENOXYLATE-ATROPINE 2.5-0.025 MG PO TABS
1.0000 | ORAL_TABLET | Freq: Four times a day (QID) | ORAL | Status: DC | PRN
  Administered 2012-03-27 (×2): 1 via ORAL
  Filled 2012-03-27 (×2): qty 1

## 2012-03-27 MED ORDER — AMLODIPINE BESYLATE 5 MG PO TABS
5.0000 mg | ORAL_TABLET | Freq: Every day | ORAL | Status: DC
  Administered 2012-03-27: 5 mg via ORAL
  Filled 2012-03-27 (×3): qty 1

## 2012-03-27 NOTE — Progress Notes (Signed)
C-diff negative, removed contact precautions

## 2012-03-27 NOTE — Progress Notes (Signed)
Utilization Review Completed.Emma Maldonado T3/16/2014  

## 2012-03-27 NOTE — Progress Notes (Signed)
Hypoglycemic Event  CBG:  66  Treatment:  4 oz apple juice and 2 graham crackers  Symptoms:none  Follow-up CBG: Time:  2141 CBG Result:  88  Possible Reasons for Event:  Pt has only had one clear liquid meal today.  Comments/MD notified:  Educated pt on ordering meals.  Will provide snacks as needed.    Berton Bon D  Remember to initiate Hypoglycemia Order Set & complete

## 2012-03-27 NOTE — Progress Notes (Signed)
[facility] PROGRESS NOTE  Emma Maldonado ZOX:[id number] DOB: 1950-11-25 DOA: 03/25/2012 PCP: Elizebeth Koller  Brief narrative  62 year old Hispanic female with history all within a transplant in 97, on immunosuppressants (follows at [facility]), type 2 diabetes mellitus and hyperlipidemia admitted for 2-3 days off nausea and vomiting with diarrhea and generalized abdominal pain and fever of 104.60F in the ED. Patient also had low systolic blood pressure of 90. Patient admitted to step down for close monitoring for sepsis.   Assessment/Plan:   Sepsis  Most likely secondary to viral gastroenteritis . Culture so far negative. C. difficile negative. Flu PCR negative. Remains afebrile. Diarrhea improving. BP stable. D/c  IV hydrocortisone. stable for transfer to tele  Uncontrolled HTN None any blood pressure medication at home. Systolic Blood pressure markedly elevated this a.m. Started on low-dose amlodipine and when necessary hydralazine with good effect.  History of renal transplant  Follows at [facility]  Continue Prograf. getting stress dose steroid. Will transition to her home dose oral prednisone  -Renal function currently stable   Type 2 diabetes mellitus  Continue home dose Lantus  Continue sliding scale insulin  Check A1C  Hyperlipidemia  Continue statin   DVT prophylaxis   Diet: Diabetic  Code Status: Full code  Family Communication: husband at bedside  Disposition Plan: Home once stable   Consultants:  None Procedures:  None Antibiotics:  IV vancomycin and Zosyn   HPI/Subjective:  Patient clinically improved. Afebrile. No abdominal pain , N/V. Has some loose stools still. c diff negative.   Objective: Filed Vitals:   03/27/12 1000 03/27/12 1147 03/27/12 1255 03/27/12 1352  BP: 224/89 196/79  112/84  Pulse: 70   66  Temp:    98.1 F (36.7 C)  TempSrc:    Oral  Resp: 23   22  Height:      Weight:   63.8 kg (140 lb 10.5 oz)   SpO2: 95%   96%     Intake/Output Summary (Last 24 hours) at 03/27/12 1355 Last data filed at 03/27/12 1308  Gross per 24 hour  Intake   2633 ml  Output   1156 ml  Net   1477 ml   Filed Weights   03/27/12 0500 03/27/12 0700 03/27/12 1255  Weight: 65.3 kg (143 lb 15.4 oz) 64.8 kg (142 lb 13.7 oz) 63.8 kg (140 lb 10.5 oz)    Exam:  General: Elderly female lying in bed in no acute distress  HEENT: No pallor, no icterus, moist oral mucosa  Cardiovascular: Normal S1 and S2, no murmurs rub or gallop  Respiratory: Right basilar crackles, no rhonchi or wheeze  Abdomen: Soft, nondistended, bowel sounds present, nontender, no organomegaly, no CVA tenderness  Musculoskeletal: Warm, no edema  CNS: AAO x3    Data Reviewed: Basic Metabolic Panel:  Recent Labs Lab 03/25/12 1943 03/26/12 0357  NA 141 136  K 3.8 3.7  CL 106 105  CO2 25 22  GLUCOSE 198* 256*  BUN 14 15  CREATININE 0.68 0.76  CALCIUM 9.2 8.2*  MG  --  1.4*   Liver Function Tests:  Recent Labs Lab 03/25/12 1943 03/26/12 0357  AST 26 16  ALT 21 15  ALKPHOS 98 76  BILITOT 1.1 1.4*  PROT 6.9 5.7*  ALBUMIN 3.7 3.0*    Recent Labs Lab 03/25/12 1943  LIPASE 12   No results found for this basename: AMMONIA,  in the last 168 hours CBC:  Recent Labs Lab 03/25/12 1943 03/26/12  0357  WBC 10.0 8.8  NEUTROABS 9.4* 8.3*  HGB 14.2 13.0  HCT 42.7 39.5  MCV 80.0 80.3  PLT 205 202   Cardiac Enzymes:  Recent Labs Lab 03/26/12 0357  TROPONINI <0.30   BNP (last 3 results) No results found for this basename: PROBNP,  in the last 8760 hours CBG:  Recent Labs Lab 03/26/12 1148 03/26/12 1605 03/26/12 2147 03/27/12 0803 03/27/12 1134  GLUCAP 234* 186* 250* 187* 179*    Recent Results (from the past 240 hour(s))  CULTURE, BLOOD (ROUTINE X 2)     Status: None   Collection Time    03/25/12  9:10 PM      Result Value Range Status   Specimen Description BLOOD RIGHT ANTECUBITAL   Final   Special Requests NONE  BOTTLES DRAWN AEROBIC AND ANAEROBIC 9CC   Final   Culture  Setup Time 03/26/2012 04:41   Final   Culture     Final   Value:        BLOOD CULTURE RECEIVED NO GROWTH TO DATE CULTURE WILL BE HELD FOR 5 DAYS BEFORE ISSUING A FINAL NEGATIVE REPORT   Report Status PENDING   Incomplete  CULTURE, BLOOD (ROUTINE X 2)     Status: None   Collection Time    03/25/12  9:17 PM      Result Value Range Status   Specimen Description BLOOD RIGHT HAND   Final   Special Requests NONE BOTTLES DRAWN AEROBIC AND ANAEROBIC 3.5CC   Final   Culture  Setup Time 03/26/2012 04:42   Final   Culture     Final   Value:        BLOOD CULTURE RECEIVED NO GROWTH TO DATE CULTURE WILL BE HELD FOR 5 DAYS BEFORE ISSUING A FINAL NEGATIVE REPORT   Report Status PENDING   Incomplete  MRSA PCR SCREENING     Status: None   Collection Time    03/26/12  1:10 AM      Result Value Range Status   MRSA by PCR NEGATIVE  NEGATIVE Final   Comment:            The GeneXpert MRSA Assay (FDA     approved for NASAL specimens     only), is one component of a     comprehensive MRSA colonization     surveillance program. It is not     intended to diagnose MRSA     infection nor to guide or     monitor treatment for     MRSA infections.  CLOSTRIDIUM DIFFICILE BY PCR     Status: None   Collection Time    03/26/12  7:01 PM      Result Value Range Status   C difficile by pcr NEGATIVE  NEGATIVE Final     Studies: Dg Chest Port 1 View  03/25/2012  *RADIOLOGY REPORT*  Clinical Data: Fever, weakness, chest pain.  PORTABLE CHEST - 1 VIEW  Comparison: None.  Findings: Heart size is accentuated by portable technique but is probably mildly enlarged.  There are perihilar bronchitic changes. No edema.  No focal consolidations or pleural effusions identified. There are degenerative changes in the spine.  IMPRESSION:  1.  Cardiomegaly. 2. No evidence for acute pulmonary abnormality.   Original Report Authenticated By: Norva Pavlov, M.D.      Scheduled Meds: . amLODipine  5 mg Oral Daily  . enoxaparin (LOVENOX) injection  40 mg Subcutaneous Q24H  . fludrocortisone  0.1 mg Oral  Daily  . gabapentin  200 mg Oral BID  . hydrocortisone sod succinate (SOLU-CORTEF) injection  50 mg Intravenous Q12H  . insulin aspart  0-9 Units Subcutaneous TID WC  . insulin glargine  30 Units Subcutaneous QHS  . magnesium oxide  800 mg Oral BID  . piperacillin-tazobactam (ZOSYN)  IV  3.375 g Intravenous Q8H  . simvastatin  10 mg Oral QHS  . sodium chloride  3 mL Intravenous Q12H  . tacrolimus  0.5 mg Oral BID  . vancomycin  500 mg Intravenous Q12H   Continuous Infusions:     Time spent: 25 minutes    Emma Maldonado  [facility] Pager [phone] If 7PM-7AM, please contact night-coverage at [email or web address], password [facility] 03/27/2012, 1:55 PM  LOS: 2 days

## 2012-03-28 DIAGNOSIS — A084 Viral intestinal infection, unspecified: Secondary | ICD-10-CM | POA: Diagnosis present

## 2012-03-28 MED ORDER — TACROLIMUS 0.5 MG PO CAPS: 0.5000 mg | ORAL_CAPSULE | Freq: Every day | ORAL | Status: AC

## 2012-03-28 NOTE — Progress Notes (Signed)
Pt and husband state that pt only takes Prograf once daily, per the transplant MD at [facility].  It is ordered bid here.  They refused the pm dose 3/16.  Berton Bon RN

## 2012-03-28 NOTE — Discharge Summary (Signed)
Physician Discharge Summary  Emma Maldonado ZOX:[id number] DOB: 05-11-1950 DOA: 03/25/2012  PCP: Elizebeth Koller  Admit date: 03/25/2012 Discharge date: 03/28/2012  Time spent: 40 minutes  Recommendations for Outpatient Follow-up:  1. Home with PCP follow up  Discharge Diagnoses:  Principal Problem:   Sepsis  Active Problems:   Viral gastroenteritis   History of renal transplant   Diabetes mellitus   Hyperlipidemia   Accelerated hypertension   Discharge Condition:fair  Diet recommendation: diabetic  Filed Weights   03/27/12 0500 03/27/12 0700 03/27/12 1255  Weight: 65.3 kg (143 lb 15.4 oz) 64.8 kg (142 lb 13.7 oz) 63.8 kg (140 lb 10.5 oz)    History of present illness:  Please refer to admission H&P for details but in brief, 62 year old Hispanic female with history all within a transplant in 39, on immunosuppressants (follows at [facility]), type 2 diabetes mellitus and hyperlipidemia admitted for 2-3 days off nausea and vomiting with diarrhea and generalized abdominal pain and fever of 104.75F in the ED. Patient also had low systolic blood pressure of 90. Patient admitted to step down for close monitoring for sepsis.   Hospital Course:   Sepsis  Most likely secondary to gastroenteritis possibly viral versus C. difficile. Admitted to stepdown given high fever and hypotension. Given aggressive IV fluids and stress dose steroids.  Influenza panel negative. . C. difficile negative. Placed on  empiric antibiotics including IV vancomycin, Zosyn and Flagyl.  -Her labs including CBC, BMPET., LFTs and lactate are normal.  blood culture negative to date.  UA unremarkable.  -Low magnesium replenished . She remains afebrile and diarrhea has markedly improved. Has chronic diarrhea for which she takes lomotil.   History of renal transplant  Follows at [facility]  Continue Prograf. Patient is also on prednisone at home  -Renal function stable   Accelerated HTN  patient has  chronically low BP and takes florinnef. On 3/16 she had accelarated BP with SBP in 230s. Given hydralazine and amlodipine with good effect. May be due to effect of stress dose steroid. Her BP today is stable and has not needed further antihypertensives. Will not prescribe her any meds as she plans to follow up with her PCP within 2 days and have her BP checked.   Type 2 diabetes mellitus  Continue home dose Lantus  A1C of 9 and dose will be adjusted by PCP.Marland Kitchen  Hyperlipidemia  Continue statin   Patient stable for discharge home with PCP follow up.      Procedures:  none  Consultations:  none  Discharge Exam: Filed Vitals:   03/27/12 1255 03/27/12 1352 03/27/12 2140 03/28/12 0630  BP:  112/84 152/74 131/63  Pulse:  66 59 60  Temp:  98.1 F (36.7 C) 98.1 F (36.7 C) 98.3 F (36.8 C)  TempSrc:  Oral Oral Oral  Resp:  22 20 16   Height:      Weight: 63.8 kg (140 lb 10.5 oz)     SpO2:  96% 98% 93%   General: Elderly female lying in bed in no acute distress  HEENT: No pallor, no icterus, moist oral mucosa  Cardiovascular: Normal S1 and S2, no murmurs rub or gallop  Respiratory: Right basilar crackles, no rhonchi or wheeze  Abdomen: Soft, nondistended, bowel sounds present, nontender, no organomegaly, no CVA tenderness  Musculoskeletal: Warm, no edema  CNS: AAO x3,   Discharge Instructions     Medication List    TAKE these medications       fludrocortisone  0.1 MG tablet  Commonly known as:  FLORINEF  Take 0.1 mg by mouth daily.     gabapentin 100 MG capsule  Commonly known as:  NEURONTIN  Take 200 mg by mouth 2 (two) times daily.     insulin aspart 100 UNIT/ML injection  Commonly known as:  novoLOG  Inject 0-20 Units into the skin 4 (four) times daily - after meals and at bedtime. Sliding scale.     insulin glargine 100 UNIT/ML injection  Commonly known as:  LANTUS  Inject 30 Units into the skin at bedtime. At 1700.     magnesium oxide 400 MG tablet   Commonly known as:  MAG-OX  Take 800 mg by mouth 2 (two) times daily.     predniSONE 5 MG tablet  Commonly known as:  DELTASONE  Take 5 mg by mouth every morning.     simvastatin 10 MG tablet  Commonly known as:  ZOCOR  Take 10 mg by mouth at bedtime.     tacrolimus 0.5 MG capsule  Commonly known as:  PROGRAF  Take 1 capsule (0.5 mg total) by mouth daily.     VITAMIN D-3 PO  Take 1 tablet by mouth 2 (two) times daily.           Follow-up Information   Follow up with Elizebeth Koller In 1 week. (patient will schedule follow up within next 2 days)    Contact information:   [facility] [place] [address] [phone]        The results of significant diagnostics from this hospitalization (including imaging, microbiology, ancillary and laboratory) are listed below for reference.    Significant Diagnostic Studies: Dg Chest Port 1 View  03/25/2012  *RADIOLOGY REPORT*  Clinical Data: Fever, weakness, chest pain.  PORTABLE CHEST - 1 VIEW  Comparison: None.  Findings: Heart size is accentuated by portable technique but is probably mildly enlarged.  There are perihilar bronchitic changes. No edema.  No focal consolidations or pleural effusions identified. There are degenerative changes in the spine.  IMPRESSION:  1.  Cardiomegaly. 2. No evidence for acute pulmonary abnormality.   Original Report Authenticated By: Norva Pavlov, M.D.     Microbiology: Recent Results (from the past 240 hour(s))  CULTURE, BLOOD (ROUTINE X 2)     Status: None   Collection Time    03/25/12  9:10 PM      Result Value Range Status   Specimen Description BLOOD RIGHT ANTECUBITAL   Final   Special Requests NONE BOTTLES DRAWN AEROBIC AND ANAEROBIC 9CC   Final   Culture  Setup Time 03/26/2012 04:41   Final   Culture     Final   Value:        BLOOD CULTURE RECEIVED NO GROWTH TO DATE CULTURE WILL BE HELD FOR 5 DAYS BEFORE ISSUING A FINAL NEGATIVE REPORT   Report Status PENDING   Incomplete   CULTURE, BLOOD (ROUTINE X 2)     Status: None   Collection Time    03/25/12  9:17 PM      Result Value Range Status   Specimen Description BLOOD RIGHT HAND   Final   Special Requests NONE BOTTLES DRAWN AEROBIC AND ANAEROBIC 3.5CC   Final   Culture  Setup Time 03/26/2012 04:42   Final   Culture     Final   Value:        BLOOD CULTURE RECEIVED NO GROWTH TO DATE CULTURE WILL BE HELD FOR 5 DAYS  BEFORE ISSUING A FINAL NEGATIVE REPORT   Report Status PENDING   Incomplete  MRSA PCR SCREENING     Status: None   Collection Time    03/26/12  1:10 AM      Result Value Range Status   MRSA by PCR NEGATIVE  NEGATIVE Final   Comment:            The GeneXpert MRSA Assay (FDA     approved for NASAL specimens     only), is one component of a     comprehensive MRSA colonization     surveillance program. It is not     intended to diagnose MRSA     infection nor to guide or     monitor treatment for     MRSA infections.  CLOSTRIDIUM DIFFICILE BY PCR     Status: None   Collection Time    03/26/12  7:01 PM      Result Value Range Status   C difficile by pcr NEGATIVE  NEGATIVE Final     Labs: Basic Metabolic Panel:  Recent Labs Lab 03/25/12 1943 03/26/12 0357  NA 141 136  K 3.8 3.7  CL 106 105  CO2 25 22  GLUCOSE 198* 256*  BUN 14 15  CREATININE 0.68 0.76  CALCIUM 9.2 8.2*  MG  --  1.4*   Liver Function Tests:  Recent Labs Lab 03/25/12 1943 03/26/12 0357  AST 26 16  ALT 21 15  ALKPHOS 98 76  BILITOT 1.1 1.4*  PROT 6.9 5.7*  ALBUMIN 3.7 3.0*    Recent Labs Lab 03/25/12 1943  LIPASE 12   No results found for this basename: AMMONIA,  in the last 168 hours CBC:  Recent Labs Lab 03/25/12 1943 03/26/12 0357  WBC 10.0 8.8  NEUTROABS 9.4* 8.3*  HGB 14.2 13.0  HCT 42.7 39.5  MCV 80.0 80.3  PLT 205 202   Cardiac Enzymes:  Recent Labs Lab 03/26/12 0357  TROPONINI <0.30   BNP: BNP (last 3 results) No results found for this basename: PROBNP,  in the last 8760  hours CBG:  Recent Labs Lab 03/26/12 2147 03/27/12 0803 03/27/12 1134 03/27/12 2055 03/27/12 2141  GLUCAP 250* 187* 179* 66* 88       Signed:  Anna Beaird  [facility] 03/28/2012, 9:12 AM

## 2012-03-29 LAB — GLUCOSE, CAPILLARY: Glucose-Capillary: 90 mg/dL (ref 70–99)

## 2012-03-31 LAB — STOOL CULTURE

## 2012-04-01 LAB — CULTURE, BLOOD (ROUTINE X 2): Culture: NO GROWTH

## 2012-08-14 ENCOUNTER — Emergency Department (HOSPITAL_COMMUNITY)
Admission: EM | Admit: 2012-08-14 | Discharge: 2012-08-14 | Disposition: A | Discharge status: Other Acute Inpatient Hospital | Attending: Emergency Medicine | Admitting: Emergency Medicine

## 2012-08-14 ENCOUNTER — Emergency Department (HOSPITAL_COMMUNITY)

## 2012-08-14 ENCOUNTER — Encounter (HOSPITAL_COMMUNITY): Payer: Self-pay | Admitting: *Deleted

## 2012-08-14 DIAGNOSIS — IMO0002 Reserved for concepts with insufficient information to code with codable children: Secondary | ICD-10-CM | POA: Insufficient documentation

## 2012-08-14 DIAGNOSIS — R748 Abnormal levels of other serum enzymes: Secondary | ICD-10-CM | POA: Insufficient documentation

## 2012-08-14 DIAGNOSIS — I10 Essential (primary) hypertension: Secondary | ICD-10-CM | POA: Insufficient documentation

## 2012-08-14 DIAGNOSIS — E119 Type 2 diabetes mellitus without complications: Secondary | ICD-10-CM | POA: Insufficient documentation

## 2012-08-14 DIAGNOSIS — Z794 Long term (current) use of insulin: Secondary | ICD-10-CM | POA: Insufficient documentation

## 2012-08-14 DIAGNOSIS — Z79899 Other long term (current) drug therapy: Secondary | ICD-10-CM | POA: Insufficient documentation

## 2012-08-14 DIAGNOSIS — F0390 Unspecified dementia without behavioral disturbance: Secondary | ICD-10-CM | POA: Insufficient documentation

## 2012-08-14 DIAGNOSIS — F29 Unspecified psychosis not due to a substance or known physiological condition: Secondary | ICD-10-CM | POA: Insufficient documentation

## 2012-08-14 DIAGNOSIS — R4182 Altered mental status, unspecified: Secondary | ICD-10-CM | POA: Insufficient documentation

## 2012-08-14 DIAGNOSIS — R509 Fever, unspecified: Secondary | ICD-10-CM

## 2012-08-14 DIAGNOSIS — Z940 Kidney transplant status: Secondary | ICD-10-CM | POA: Insufficient documentation

## 2012-08-14 LAB — CBC WITH DIFFERENTIAL/PLATELET
Basophils Absolute: 0 10*3/uL (ref 0.0–0.1)
Basophils Relative: 0 % (ref 0–1)
Eosinophils Relative: 0 % (ref 0–5)
Lymphocytes Relative: 9 % — ABNORMAL LOW (ref 12–46)
MCHC: 31.9 g/dL (ref 30.0–36.0)
Neutro Abs: 8.4 10*3/uL — ABNORMAL HIGH (ref 1.7–7.7)
Platelets: 147 10*3/uL — ABNORMAL LOW (ref 150–400)
RDW: 15.9 % — ABNORMAL HIGH (ref 11.5–15.5)
WBC: 9.9 10*3/uL (ref 4.0–10.5)

## 2012-08-14 LAB — COMPREHENSIVE METABOLIC PANEL
ALT: 208 U/L — ABNORMAL HIGH (ref 0–35)
AST: 86 U/L — ABNORMAL HIGH (ref 0–37)
Albumin: 2.8 g/dL — ABNORMAL LOW (ref 3.5–5.2)
CO2: 26 mEq/L (ref 19–32)
Calcium: 8.6 mg/dL (ref 8.4–10.5)
Chloride: 104 mEq/L (ref 96–112)
GFR calc non Af Amer: >90 mL/min (ref >90)
Sodium: 140 mEq/L (ref 135–145)

## 2012-08-14 LAB — URINALYSIS, ROUTINE W REFLEX MICROSCOPIC
Glucose, UA: NEGATIVE mg/dL
Hgb urine dipstick: NEGATIVE
Specific Gravity, Urine: 1.031 — ABNORMAL HIGH (ref 1.005–1.030)
Urobilinogen, UA: 1 mg/dL (ref 0.0–1.0)

## 2012-08-14 LAB — SALICYLATE LEVEL: Salicylate Lvl: <2 mg/dL — ABNORMAL LOW (ref 2.8–20.0)

## 2012-08-14 LAB — GLUCOSE, CAPILLARY: Glucose-Capillary: 138 mg/dL — ABNORMAL HIGH (ref 70–99)

## 2012-08-14 LAB — RAPID URINE DRUG SCREEN, HOSP PERFORMED
Barbiturates: NOT DETECTED
Cocaine: NOT DETECTED
Tetrahydrocannabinol: NOT DETECTED

## 2012-08-14 LAB — URINE MICROSCOPIC-ADD ON

## 2012-08-14 LAB — ACETAMINOPHEN LEVEL: Acetaminophen (Tylenol), Serum: <15 ug/mL (ref 10–30)

## 2012-08-14 MED ORDER — VANCOMYCIN HCL IN DEXTROSE 1-5 GM/200ML-% IV SOLN
1000.0000 mg | Freq: Once | INTRAVENOUS | Status: AC
  Administered 2012-08-14: 1000 mg via INTRAVENOUS
  Filled 2012-08-14: qty 200

## 2012-08-14 MED ORDER — CLONIDINE HCL 0.1 MG PO TABS
0.1000 mg | ORAL_TABLET | Freq: Once | ORAL | Status: AC
  Administered 2012-08-14: 0.1 mg via ORAL
  Filled 2012-08-14: qty 1

## 2012-08-14 MED ORDER — PIPERACILLIN-TAZOBACTAM 3.375 G IVPB 30 MIN
3.3750 g | Freq: Once | INTRAVENOUS | Status: AC
  Administered 2012-08-14: 3.375 g via INTRAVENOUS
  Filled 2012-08-14: qty 50

## 2012-08-14 NOTE — ED Notes (Addendum)
Per ems: pt from home, had rod placed in left foot last week, has been taking oxycodone and oxymorphone since Wednesday. Pt's husband tells ems she has been lethargic since Friday, unknown why he did not call ems sooner. Pt had tympanic temp of 103. On ems arrival pt was sleeping in her bed with feces in her pants and a towel in between her legs. Husband is a marine, is controlling over pt, has control over her medication. States he did not give her any pain meds today, but ems found pt with white pills in mouth on arrival. Husband states he does not know how they got there. Pt speaks little english, is responsive to stimulation. o2 saturation 80% on ra. Pt became alert when ems placed nasal canula in nose. bp 128/60, cbg 158, temp 103

## 2012-08-14 NOTE — ED Provider Notes (Addendum)
CSN: [id number]     Arrival date & time 08/14/12  0921 History     First MD Initiated Contact with Patient 08/14/12 0930     No chief complaint on file.  (Consider location/radiation/quality/duration/timing/severity/associated sxs/prior Treatment) HPI Comments: Patient presents with fever and altered mental status. 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 She is on immunosuppressive medications. Most of her care is typically done at [facility]. Her husband states that she's been doing fine as far as her kidney transplant goes. She recently had a fracture repair of her lower leg at [facility]. Her lower leg is in a cast. Her husband states the cast was changed on Friday which was 2 days ago and there were no signs of infection. Patient is down this morning to be unresponsive and the husband tried to wake her up. There was some white powder in her mouth and concern that she might have taken too many medications. However her husband says that he closely car to medications in that there is no way she could of gotten into any medications. He did give her her regular medications and feels like this is likely the powder within the mouth. He noted her to have an increased temp of 99 last night and then going up to 101 this morning. When EMS arrived there was 103 tympanic. History is limited and only obtained per the husband. The patient is confused but husband says this is her normal mental status currently she has underlying dementia.   Past Medical History  Diagnosis Date  . Diabetes mellitus without complication   . Hypertension   . Dementia   . Renal disorder    Past Surgical History  Procedure Laterality Date  . Nephrectomy transplanted organ    . Abdominal hysterectomy    . Cholecystectomy     Family History  Problem Relation Age of Onset  . Diabetes Mellitus II Father    History   Substance Use Topics  . Smoking status: Never Smoker   . Smokeless tobacco: Not on file  . Alcohol Use: No   OB History   Grav Para Term Preterm Abortions TAB SAB Ect Mult Living                 Review of Systems  Unable to perform ROS: Dementia    Allergies  Review of patient's allergies indicates no known allergies.  Home Medications   Current Outpatient Rx  Name  Route  Sig  Dispense  Refill  . acetaminophen (TYLENOL) 325 MG tablet   Oral   Take 1,300 mg by mouth 2 (two) times daily.         . calcium carbonate (OS-CAL) 600 MG TABS   Oral   Take 600 mg by mouth 2 (two) times daily with a meal.         . cholecalciferol (VITAMIN D) 1000 UNITS tablet   Oral   Take 1,000 Units by mouth daily.         . diclofenac sodium (VOLTAREN) 1 % GEL   Topical   Apply 2 g topically 2 (two) times daily as needed (for shoulder pain).         Marland Kitchen diphenoxylate-atropine (LOMOTIL) 2.5-0.025 MG per tablet   Oral   Take 1 tablet by mouth 4 (four) times daily as needed for diarrhea or loose stools.         Marland Kitchen  docusate sodium (COLACE) 100 MG capsule   Oral   Take 100 mg by mouth 2 (two) times daily.         . fludrocortisone (FLORINEF) 0.1 MG tablet   Oral   Take 0.1 mg by mouth at bedtime.          . gabapentin (NEURONTIN) 100 MG capsule   Oral   Take 200 mg by mouth 2 (two) times daily.         . insulin aspart (NOVOLOG) 100 UNIT/ML injection   Subcutaneous   Inject 0-20 Units into the skin 4 (four) times daily - after meals and at bedtime. Sliding scale.         . insulin glargine (LANTUS) 100 UNIT/ML injection   Subcutaneous   Inject 32 Units into the skin every evening. At 1700.         . magnesium oxide (MAG-OX) 400 MG tablet   Oral   Take 800 mg by mouth 2 (two) times daily.         . memantine (NAMENDA) 10 MG tablet   Oral   Take 10 mg by mouth 2 (two) times daily.         . midodrine (PROAMATINE) 5 MG tablet   Oral   Take 10 mg by  mouth 3 (three) times daily as needed (for pain).         . ondansetron (ZOFRAN-ODT) 4 MG disintegrating tablet   Oral   Take 4 mg by mouth every 8 (eight) hours as needed for nausea.         Marland Kitchen oxyCODONE (OXYCONTIN) 10 MG 12 hr tablet   Oral   Take 10 mg by mouth every 12 (twelve) hours.         . predniSONE (DELTASONE) 5 MG tablet   Oral   Take 5 mg by mouth every morning.         . sertraline (ZOLOFT) 25 MG tablet   Oral   Take 25 mg by mouth every evening.         . simvastatin (ZOCOR) 10 MG tablet   Oral   Take 10 mg by mouth at bedtime.         . tacrolimus (PROGRAF) 0.5 MG capsule   Oral   Take 1 capsule (0.5 mg total) by mouth daily.   1 capsule   0    BP 177/81  Pulse 72  Temp(Src) 100.1 F (37.8 C) (Rectal)  Resp 17  SpO2 98% Physical Exam  Constitutional: She is oriented to person, place, and time. She appears well-developed and well-nourished.  HENT:  Head: Normocephalic and atraumatic.  Eyes: Pupils are equal, round, and reactive to light.  Neck: Normal range of motion. Neck supple.  Cardiovascular: Normal rate, regular rhythm and normal heart sounds.   Pulmonary/Chest: Effort normal and breath sounds normal. No respiratory distress. She has no wheezes. She has no rales. She exhibits no tenderness.  Abdominal: Soft. Bowel sounds are normal. There is no tenderness. There is no rebound and no guarding.  Musculoskeletal: Normal range of motion. She exhibits no edema.  Patient's lower leg is in a cast. There is no erythema of the toes or the visible portions of the leg.  Lymphadenopathy:    She has no cervical adenopathy.  Neurological: She is alert and oriented to person, place, and time.  Patient is alert and talking but is confused. She has her eyes closed but easily responds to verbal stimuli. She is  moving all extremities symmetrically but has a hard time following commands. There is no obvious facial droop.  Skin: Skin is warm and dry. No  rash noted.  Psychiatric: She has a normal mood and affect.    ED Course   Procedures (including critical care time)  Results for orders placed during the hospital encounter of 08/14/12  CBC WITH DIFFERENTIAL      Result Value Range   WBC 9.9  4.0 - 10.5 K/uL   RBC 3.76 (*) 3.87 - 5.11 MIL/uL   Hemoglobin 10.1 (*) 12.0 - 15.0 g/dL   HCT 16.1 (*) 09.6 - 04.5 %   MCV 84.3  78.0 - 100.0 fL   MCH 26.9  26.0 - 34.0 pg   MCHC 31.9  30.0 - 36.0 g/dL   RDW 40.9 (*) 81.1 - 91.4 %   Platelets 147 (*) 150 - 400 K/uL   Neutrophils Relative % 85 (*) 43 - 77 %   Neutro Abs 8.4 (*) 1.7 - 7.7 K/uL   Lymphocytes Relative 9 (*) 12 - 46 %   Lymphs Abs 0.9  0.7 - 4.0 K/uL   Monocytes Relative 6  3 - 12 %   Monocytes Absolute 0.6  0.1 - 1.0 K/uL   Eosinophils Relative 0  0 - 5 %   Eosinophils Absolute 0.0  0.0 - 0.7 K/uL   Basophils Relative 0  0 - 1 %   Basophils Absolute 0.0  0.0 - 0.1 K/uL  COMPREHENSIVE METABOLIC PANEL      Result Value Range   Sodium 140  135 - 145 mEq/L   Potassium 3.8  3.5 - 5.1 mEq/L   Chloride 104  96 - 112 mEq/L   CO2 26  19 - 32 mEq/L   Glucose, Bld 158 (*) 70 - 99 mg/dL   BUN 18  6 - 23 mg/dL   Creatinine, Ser 7.82  0.50 - 1.10 mg/dL   Calcium 8.6  8.4 - 95.6 mg/dL   Total Protein 5.6 (*) 6.0 - 8.3 g/dL   Albumin 2.8 (*) 3.5 - 5.2 g/dL   AST 86 (*) 0 - 37 U/L   ALT 208 (*) 0 - 35 U/L   Alkaline Phosphatase 182 (*) 39 - 117 U/L   Total Bilirubin 1.0  0.3 - 1.2 mg/dL   GFR calc non Af Amer >90  >90 mL/min   GFR calc Af Amer >90  >90 mL/min  URINALYSIS, ROUTINE W REFLEX MICROSCOPIC      Result Value Range   Color, Urine AMBER (*) YELLOW   APPearance CLEAR  CLEAR   Specific Gravity, Urine 1.031 (*) 1.005 - 1.030   pH 6.0  5.0 - 8.0   Glucose, UA NEGATIVE  NEGATIVE mg/dL   Hgb urine dipstick NEGATIVE  NEGATIVE   Bilirubin Urine SMALL (*) NEGATIVE   Ketones, ur 15 (*) NEGATIVE mg/dL   Protein, ur NEGATIVE  NEGATIVE mg/dL   Urobilinogen, UA 1.0  0.0 - 1.0  mg/dL   Nitrite NEGATIVE  NEGATIVE   Leukocytes, UA SMALL (*) NEGATIVE  ETHANOL      Result Value Range   Alcohol, Ethyl (B) <11  0 - 11 mg/dL  URINE RAPID DRUG SCREEN (HOSP PERFORMED)      Result Value Range   Opiates NONE DETECTED  NONE DETECTED   Cocaine NONE DETECTED  NONE DETECTED   Benzodiazepines NONE DETECTED  NONE DETECTED   Amphetamines NONE DETECTED  NONE DETECTED   Tetrahydrocannabinol NONE DETECTED  NONE DETECTED   Barbiturates NONE DETECTED  NONE DETECTED  ACETAMINOPHEN LEVEL      Result Value Range   Acetaminophen (Tylenol), Serum <15.0  10 - 30 ug/mL  SALICYLATE LEVEL      Result Value Range   Salicylate Lvl <2.0 (*) 2.8 - 20.0 mg/dL  URINE MICROSCOPIC-ADD ON      Result Value Range   Squamous Epithelial / LPF RARE  RARE   WBC, UA 0-2  <3 WBC/hpf   Bacteria, UA RARE  RARE   Urine-Other AMORPHOUS URATES/PHOSPHATES    CG4 I-STAT (LACTIC ACID)      Result Value Range   Lactic Acid, Venous 1.31  0.5 - 2.2 mmol/L   Dg Chest 2 View  08/14/2012   *RADIOLOGY REPORT*  Clinical Data: Fever, hypertension.  CHEST - 2 VIEW  Comparison: 03/25/2012  Findings: Heart is borderline in size.  Mild vascular congestion. Low lung volumes with bibasilar atelectasis.  No acute bony abnormality.  No visible effusions.  IMPRESSION: Low lung volumes, bibasilar atelectasis.  Borderline cardiomegaly, vascular congestion.   Original Report Authenticated By: Charlett Nose, M.D.    Date: 08/14/2012  Rate: 79  Rhythm: normal sinus rhythm  QRS Axis: normal  Intervals: normal  ST/T Wave abnormalities: nonspecific ST/T changes  Conduction Disutrbances:none  Narrative Interpretation:   Old EKG Reviewed: none available      1. Fever of unknown origin   2. Immunocompromised     MDM  Patient presents with a fever. Currently she has no evidence of sepsis. Her blood pressures been good and she has a normal lactic acid. Her liver enzymes are mildly elevated but she has no abdominal pain on  exam. Her chest x-ray did not show evidence of pneumonia. Her urinalysis did not show evidence of infection. I did bivalve her cast and look at her surgical wounds appears to be okay with some serosanguineous drainage but no signs of infection. A consultation with the family medicine physician at [facility] who has accepted patient for transfer by Dr Mack Hook.  The patient's husband is adamant that the patient be transferred to [facility] where her prior care has been done. Given the fact that she's immunocompromised with a fever of 103 I did presumptively give her antibiotics of Zosyn and vancomycin and on arrival to the ED.  Rolan Bucco, MD 08/14/12 1340  Rolan Bucco, MD 08/14/12 1515

## 2012-08-14 NOTE — ED Notes (Addendum)
pts husband at bedside, states he wanted ems to bring pt to [facility] since all her records are there. Requesting to have pt transferred to [facility] now. Dr Fredderick Phenix notified

## 2012-08-14 NOTE — ED Notes (Signed)
Attempted to draw labs 3 times, will notify phlebotomy

## 2012-08-14 NOTE — ED Notes (Signed)
RUE:[id number]<JW> Expected date:<BR> Expected time:<BR> Means of arrival:<BR> Comments:<BR> EMS recent ortho. Surgery/febrile w lethargy

## 2012-08-14 NOTE — ED Notes (Signed)
[facility] called for pt transportation to [facility]

## 2012-08-15 LAB — URINE CULTURE
Colony Count: NO GROWTH
Culture: NO GROWTH

## 2012-08-20 LAB — CULTURE, BLOOD (ROUTINE X 2): Culture: NO GROWTH

## 2019-09-27 ENCOUNTER — Ambulatory Visit: Admitting: Nurse Practitioner

## 2019-11-14 ENCOUNTER — Ambulatory Visit: Admitting: Family Medicine

## 2020-03-12 ENCOUNTER — Emergency Department (HOSPITAL_COMMUNITY)
Admission: EM | Admit: 2020-03-12 | Discharge: 2020-03-13 | Disposition: A | Discharge status: Other Acute Inpatient Hospital | Payer: Medicare Other | Attending: Emergency Medicine | Admitting: Emergency Medicine

## 2020-03-12 ENCOUNTER — Encounter (HOSPITAL_COMMUNITY): Payer: Self-pay | Admitting: Emergency Medicine

## 2020-03-12 ENCOUNTER — Other Ambulatory Visit: Payer: Self-pay

## 2020-03-12 ENCOUNTER — Emergency Department (HOSPITAL_COMMUNITY): Payer: Medicare Other

## 2020-03-12 DIAGNOSIS — N390 Urinary tract infection, site not specified: Secondary | ICD-10-CM | POA: Insufficient documentation

## 2020-03-12 DIAGNOSIS — I10 Essential (primary) hypertension: Secondary | ICD-10-CM | POA: Diagnosis not present

## 2020-03-12 DIAGNOSIS — Z79899 Other long term (current) drug therapy: Secondary | ICD-10-CM | POA: Diagnosis not present

## 2020-03-12 DIAGNOSIS — Z794 Long term (current) use of insulin: Secondary | ICD-10-CM | POA: Insufficient documentation

## 2020-03-12 DIAGNOSIS — E1165 Type 2 diabetes mellitus with hyperglycemia: Secondary | ICD-10-CM | POA: Insufficient documentation

## 2020-03-12 DIAGNOSIS — F0390 Unspecified dementia without behavioral disturbance: Secondary | ICD-10-CM | POA: Diagnosis not present

## 2020-03-12 DIAGNOSIS — R0789 Other chest pain: Secondary | ICD-10-CM | POA: Insufficient documentation

## 2020-03-12 DIAGNOSIS — R739 Hyperglycemia, unspecified: Secondary | ICD-10-CM

## 2020-03-12 LAB — URINALYSIS, ROUTINE W REFLEX MICROSCOPIC
Bilirubin Urine: NEGATIVE
Glucose, UA: >=500 mg/dL — AB
Hgb urine dipstick: NEGATIVE
Ketones, ur: NEGATIVE mg/dL
Nitrite: POSITIVE — AB
Protein, ur: NEGATIVE mg/dL
Specific Gravity, Urine: 1.023 (ref 1.005–1.030)
WBC, UA: >50 WBC/hpf — ABNORMAL HIGH (ref 0–5)
pH: 6 (ref 5.0–8.0)

## 2020-03-12 LAB — BASIC METABOLIC PANEL
Anion gap: 10 (ref 5–15)
BUN: 12 mg/dL (ref 8–23)
CO2: 27 mmol/L (ref 22–32)
Calcium: 9.2 mg/dL (ref 8.9–10.3)
Chloride: 103 mmol/L (ref 98–111)
Creatinine, Ser: 0.89 mg/dL (ref 0.44–1.00)
GFR, Estimated: >60 mL/min (ref >60)
Glucose, Bld: 455 mg/dL — ABNORMAL HIGH (ref 70–99)
Potassium: 4.6 mmol/L (ref 3.5–5.1)
Sodium: 140 mmol/L (ref 135–145)

## 2020-03-12 LAB — CBG MONITORING, ED
Glucose-Capillary: 408 mg/dL — ABNORMAL HIGH (ref 70–99)
Glucose-Capillary: 384 mg/dL — ABNORMAL HIGH (ref 70–99)

## 2020-03-12 LAB — CBC
HCT: 41.5 % (ref 36.0–46.0)
Hemoglobin: 13.4 g/dL (ref 12.0–15.0)
MCH: 26.6 pg (ref 26.0–34.0)
MCHC: 32.3 g/dL (ref 30.0–36.0)
MCV: 82.3 fL (ref 80.0–100.0)
Platelets: 178 10*3/uL (ref 150–400)
RBC: 5.04 MIL/uL (ref 3.87–5.11)
RDW: 14.8 % (ref 11.5–15.5)
WBC: 8.3 10*3/uL (ref 4.0–10.5)
nRBC: 0 % (ref 0.0–0.2)

## 2020-03-12 LAB — TROPONIN I (HIGH SENSITIVITY)
Troponin I (High Sensitivity): 7 ng/L (ref <18)
Troponin I (High Sensitivity): 9 ng/L (ref <18)

## 2020-03-12 MED ORDER — CEPHALEXIN 250 MG PO CAPS
500.0000 mg | ORAL_CAPSULE | Freq: Once | ORAL | Status: AC
  Administered 2020-03-12: 500 mg via ORAL
  Filled 2020-03-12: qty 2

## 2020-03-12 MED ORDER — CEPHALEXIN 500 MG PO CAPS: 500.0000 mg | ORAL_CAPSULE | Freq: Four times a day (QID) | ORAL | 0 refills | Status: AC

## 2020-03-12 MED ORDER — INSULIN ASPART 100 UNIT/ML ~~LOC~~ SOLN
5.0000 [IU] | Freq: Once | SUBCUTANEOUS | Status: AC
  Administered 2020-03-12: 5 [IU] via SUBCUTANEOUS

## 2020-03-12 NOTE — ED Notes (Signed)
Pt family leaving lobby at this time, aware that they are permitted to stay with pt for ALONE criteria but state that they cannot at this time.

## 2020-03-12 NOTE — Discharge Instructions (Addendum)
Your work-up today was overall reassuring.  Your EKG was normal, and your troponin was negative twice. Follow-up with your primary care doctor for further evaluation of your chest pain. Your urine showed there may be a urine infection.  Take antibiotics as prescribed.  Take entire course, even if symptoms improve. Follow-up with your primary care doctor to ensure your urine infection is improved. Your blood sugars were elevated today. Mae sure you are eating a low carb diet and taking your diabetes medicines as prescribed.   Return to the ER with any new, worsening, or concerning symptoms.

## 2020-03-12 NOTE — ED Provider Notes (Signed)
[facility] EMERGENCY DEPARTMENT Provider Note   CSN: [id number] Arrival date & time: 03/12/20  1820     History Chief Complaint  Patient presents with  . Chest Pain    Emma Maldonado is a 70 y.o. female presenting for evaluation of chest pain.  Level 5 caveat due to dementia.  History provided mostly by patient's daughter. She states 1 month ago, patient was moved to a nursing home due to dementia. Today at the nursing home, patient complained of chest pain, thus she was brought to the ER. Daughter states until recently, patient's husband was primary caregiver, and while she normally had an issue with her blood pressure, she never complained of chest pain. Daughter is concerned due to patient's history of hypertension, diabetes, and kidney transplant. Patient has never had heart problems, nor seen a cardiologist.  Patient reports only mild chest pain currently. Denies difficulty breathing, cough, nausea, vomiting, abdominal pain. Denies change in urination or bowel movements.  HPI     Past Medical History:  Diagnosis Date  . Dementia (HCC)   . Diabetes mellitus without complication (HCC)   . Hypertension   . Renal disorder     Patient Active Problem List   Diagnosis Date Noted  . Accelerated hypertension 03/28/2012  . Viral gastroenteritis 03/28/2012  . Sepsis (HCC) 03/26/2012  . History of renal transplant 03/26/2012  . Diabetes mellitus (HCC) 03/26/2012  . Hyperlipidemia 03/26/2012    Past Surgical History:  Procedure Laterality Date  . ABDOMINAL HYSTERECTOMY    . CHOLECYSTECTOMY    . NEPHRECTOMY TRANSPLANTED ORGAN       OB History   No obstetric history on file.     Family History  Problem Relation Age of Onset  . Diabetes Mellitus II Father     Social History   Tobacco Use  . Smoking status: Never Smoker  Substance Use Topics  . Alcohol use: No  . Drug use: No    Home Medications Prior to Admission medications    Medication Sig Start Date End Date Taking? Authorizing Provider  cephALEXin (KEFLEX) 500 MG capsule Take 1 capsule (500 mg total) by mouth 4 (four) times daily for 5 days. 03/12/20 03/17/20 Yes Daray Polgar, PA-C  acetaminophen (TYLENOL) 325 MG tablet Take 1,300 mg by mouth 2 (two) times daily.    [provider]  calcium carbonate (OS-CAL) 600 MG TABS Take 600 mg by mouth 2 (two) times daily with a meal.    [provider]  cholecalciferol (VITAMIN D) 1000 UNITS tablet Take 1,000 Units by mouth daily.    [provider]  diclofenac sodium (VOLTAREN) 1 % GEL Apply 2 g topically 2 (two) times daily as needed (for shoulder pain).    [provider]  diphenoxylate-atropine (LOMOTIL) 2.5-0.025 MG per tablet Take 1 tablet by mouth 4 (four) times daily as needed for diarrhea or loose stools.    [provider]  docusate sodium (COLACE) 100 MG capsule Take 100 mg by mouth 2 (two) times daily.    [provider]  fludrocortisone (FLORINEF) 0.1 MG tablet Take 0.1 mg by mouth at bedtime.     [provider]  gabapentin (NEURONTIN) 100 MG capsule Take 200 mg by mouth 2 (two) times daily.    [provider]  insulin aspart (NOVOLOG) 100 UNIT/ML injection Inject 0-20 Units into the skin 4 (four) times daily - after meals and at bedtime. Sliding scale.    [provider]  insulin  glargine (LANTUS) 100 UNIT/ML injection Inject 32 Units into the skin every evening. At 1700.    [provider]  magnesium oxide (MAG-OX) 400 MG tablet Take 800 mg by mouth 2 (two) times daily.    [provider]  memantine (NAMENDA) 10 MG tablet Take 10 mg by mouth 2 (two) times daily.    [provider]  midodrine (PROAMATINE) 5 MG tablet Take 10 mg by mouth 3 (three) times daily as needed (for pain).    [provider]  ondansetron (ZOFRAN-ODT) 4 MG disintegrating tablet Take 4 mg by mouth every 8 (eight) hours as  needed for nausea.    [provider]  oxyCODONE (OXYCONTIN) 10 MG 12 hr tablet Take 10 mg by mouth every 12 (twelve) hours.    [provider]  predniSONE (DELTASONE) 5 MG tablet Take 5 mg by mouth every morning.    [provider]  sertraline (ZOLOFT) 25 MG tablet Take 25 mg by mouth every evening.    [provider]  simvastatin (ZOCOR) 10 MG tablet Take 10 mg by mouth at bedtime.    [provider]  tacrolimus (PROGRAF) 0.5 MG capsule Take 1 capsule (0.5 mg total) by mouth daily. 03/28/12   Dhungel, Theda BelfastNishant, MD    Allergies    Patient has no known allergies.  Review of Systems   Review of Systems  Unable to perform ROS: Dementia    Physical Exam Updated Vital Signs BP (!) 193/92 (BP Location: Left Arm)   Pulse 77   Temp 98 F (36.7 C) (Oral)   Resp 17   Ht 5\' 2"  (1.575 m)   Wt 64 kg   SpO2 95%   BMI 25.81 kg/m   Physical Exam Vitals and nursing note reviewed.  Constitutional:      General: She is not in acute distress.    Appearance: She is well-developed and well-nourished.     Comments: Resting comfortably in the bed in no acute distress  HENT:     Head: Normocephalic and atraumatic.  Eyes:     Extraocular Movements: Extraocular movements intact and EOM normal.     Conjunctiva/sclera: Conjunctivae normal.     Pupils: Pupils are equal, round, and reactive to light.  Cardiovascular:     Rate and Rhythm: Normal rate and regular rhythm.     Pulses: Normal pulses and intact distal pulses.  Pulmonary:     Effort: Pulmonary effort is normal. No respiratory distress.     Breath sounds: Normal breath sounds. No wheezing.     Comments: Tenderness palpation of the right side of chest wall. Speaking full sentences. Clear lung sounds in all fields. Chest:     Chest wall: Tenderness present.  Abdominal:     General: There is no distension.     Palpations: Abdomen is soft. There is no mass.     Tenderness: There is no abdominal  tenderness. There is no guarding or rebound.     Comments: Mild TTP over the abdomen where kidney transplant is. No erythema or warmth.  Musculoskeletal:        General: Normal range of motion.     Cervical back: Normal range of motion and neck supple.     Right lower leg: No edema.     Left lower leg: No edema.  Skin:    General: Skin is warm and dry.     Capillary Refill: Capillary refill takes less than 2 seconds.  Neurological:  Mental Status: She is alert.     Comments: At baseline mental status per daughter  Psychiatric:        Mood and Affect: Mood and affect normal.     ED Results / Procedures / Treatments   Labs (all labs ordered are listed, but only abnormal results are displayed) Labs Reviewed  BASIC METABOLIC PANEL - Abnormal; Notable for the following components:      Result Value   Glucose, Bld 455 (*)    All other components within normal limits  URINALYSIS, ROUTINE W REFLEX MICROSCOPIC - Abnormal; Notable for the following components:   APPearance HAZY (*)    Glucose, UA >=500 (*)    Nitrite POSITIVE (*)    Leukocytes,Ua LARGE (*)    WBC, UA >50 (*)    Bacteria, UA RARE (*)    All other components within normal limits  CBG MONITORING, ED - Abnormal; Notable for the following components:   Glucose-Capillary 408 (*)    All other components within normal limits  CBG MONITORING, ED - Abnormal; Notable for the following components:   Glucose-Capillary 384 (*)    All other components within normal limits  URINE CULTURE  CBC  TROPONIN I (HIGH SENSITIVITY)  TROPONIN I (HIGH SENSITIVITY)    EKG EKG Interpretation  Date/Time:  Tuesday March 12 2020 18:25:28 EST Ventricular Rate:  76 PR Interval:  128 QRS Duration: 78 QT Interval:  422 QTC Calculation: 474 R Axis:   35 Text Interpretation: Normal sinus rhythm Nonspecific ST abnormality Abnormal ECG No significant change since last tracing Confirmed by Melene Plan [phone]) on 03/12/2020 9:21:59 PM   EKG  Interpretation  Date/Time:  Tuesday March 12 2020 23:20:17 EST Ventricular Rate:  78 PR Interval:  128 QRS Duration: 85 QT Interval:  396 QTC Calculation: 452 R Axis:   59 Text Interpretation: Sinus rhythm Confirmed by Kennis Carina [phone]) on 03/12/2020 11:34:00 PM         Radiology DG Chest 2 View  Result Date: 03/12/2020 CLINICAL DATA:  Chest pain EXAM: CHEST - 2 VIEW COMPARISON:  05/06/2019 FINDINGS: There are streaky airspace opacities at the lung bases favored to represent areas of atelectasis. There is no large pleural effusion or pneumothorax. The heart size is unremarkable. Aortic calcifications are noted. IMPRESSION: No active cardiopulmonary disease. Electronically Signed   By: Katherine Mantle M.D.   On: 03/12/2020 19:56    Procedures Procedures   Medications Ordered in ED Medications  cephALEXin (KEFLEX) capsule 500 mg (has no administration in time range)  insulin aspart (novoLOG) injection 5 Units (5 Units Subcutaneous Given 03/12/20 2220)    ED Course  I have reviewed the triage vital signs and the nursing notes.  Pertinent labs & imaging results that were available during my care of the patient were reviewed by me and considered in my medical decision making (see chart for details).    MDM Rules/Calculators/A&P                          Patient presenting for evaluation of chest pain. On exam, patient peers nontoxic. History and physical limited due to patient's history of dementia. Pain is reproducible with palpation of the chest wall, as such, doubt ACS. However patient does has risk factors. Additionally, in the setting of dementia and a low-grade temperature of 99.2, consider infectious cause. Labs obtained in triage interpreted by me, overall reassuring. Kidney function normal. No leukocytosis. cbg elevated at 400s, but  electrolytes otherwise reassuring. No dka. Troponin negative x2. Chest x-ray viewed interpreted by me, no pneumonia pneumothorax effusion.  Will check urine due to patient's history of a kidney transplant on immunosuppression to ensure no infection.  Urine shows some signs of infection, and that there are large leuks, nitrates, many WBCs, although not many bacteria.  Will send for culture.  However in the setting of kidney transplant with immunosuppression, will treat with antibiotics.  Patient does not appear septic, I do not like she needs admission for IV antibiotics at this time.  However she will need close follow-up with PCP.  Discussed findings with patient and daughter.  Patient had reported chest pain, repeat EKG obtained, shows normal sinus rhythm.  While blood pressures documented as being significantly elevated at 207/126, on my evaluation, blood pressure is 176/84.  Discussed with daughter importance of follow-up with PCP for evaluation of blood pressure, chest pain, and urinary symptoms.  First dose of antibiotics given in the ED.  Case discussed with attending, Dr. Adela Lank agrees to plan. At this time, patient appears safe for discharge.  Return precautions given.  Daughter states she understands and agrees to plan.  Final Clinical Impression(s) / ED Diagnoses Final diagnoses:  Atypical chest pain  Hypertension, unspecified type  Hyperglycemia  Urinary tract infection without hematuria, site unspecified    Rx / DC Orders ED Discharge Orders         Ordered    cephALEXin (KEFLEX) 500 MG capsule  4 times daily        03/12/20 2326           Kodie Pick, PA-C 03/12/20 2334    Melene Plan, DO 03/15/20 1525

## 2020-03-12 NOTE — ED Triage Notes (Signed)
Patient BIB [facility] from [facility]. Complaint of chest pain. Started under left breast and moved under the right. Patient received 324 asa, nitro x1. Chest pain resolved. VSS.

## 2020-03-12 NOTE — ED Notes (Signed)
Pt with dementia

## 2020-03-13 NOTE — ED Notes (Signed)
Discharge instructions gone over with patient and daughter at bedside. Daughter will bring pt back to [facility] health.

## 2020-03-15 LAB — URINE CULTURE

## 2020-03-24 ENCOUNTER — Emergency Department (HOSPITAL_COMMUNITY): Payer: Medicare Other

## 2020-03-24 ENCOUNTER — Inpatient Hospital Stay (HOSPITAL_COMMUNITY)
Admission: EM | Admit: 2020-03-24 | Discharge: 2020-03-29 | DRG: 689 | Disposition: A | Discharge status: Skilled Nursing Facility | Payer: Medicare Other | Source: Skilled Nursing Facility | Attending: Internal Medicine | Admitting: Internal Medicine

## 2020-03-24 DIAGNOSIS — R296 Repeated falls: Secondary | ICD-10-CM | POA: Diagnosis present

## 2020-03-24 DIAGNOSIS — N3000 Acute cystitis without hematuria: Secondary | ICD-10-CM

## 2020-03-24 DIAGNOSIS — N390 Urinary tract infection, site not specified: Secondary | ICD-10-CM | POA: Diagnosis not present

## 2020-03-24 DIAGNOSIS — E1165 Type 2 diabetes mellitus with hyperglycemia: Secondary | ICD-10-CM

## 2020-03-24 DIAGNOSIS — R410 Disorientation, unspecified: Secondary | ICD-10-CM

## 2020-03-24 DIAGNOSIS — B9620 Unspecified Escherichia coli [E. coli] as the cause of diseases classified elsewhere: Secondary | ICD-10-CM | POA: Diagnosis present

## 2020-03-24 DIAGNOSIS — E785 Hyperlipidemia, unspecified: Secondary | ICD-10-CM | POA: Diagnosis present

## 2020-03-24 DIAGNOSIS — E876 Hypokalemia: Secondary | ICD-10-CM | POA: Diagnosis present

## 2020-03-24 DIAGNOSIS — R251 Tremor, unspecified: Secondary | ICD-10-CM | POA: Diagnosis present

## 2020-03-24 DIAGNOSIS — I10 Essential (primary) hypertension: Secondary | ICD-10-CM

## 2020-03-24 DIAGNOSIS — H9190 Unspecified hearing loss, unspecified ear: Secondary | ICD-10-CM | POA: Diagnosis present

## 2020-03-24 DIAGNOSIS — G929 Unspecified toxic encephalopathy: Secondary | ICD-10-CM | POA: Diagnosis present

## 2020-03-24 DIAGNOSIS — Z833 Family history of diabetes mellitus: Secondary | ICD-10-CM

## 2020-03-24 DIAGNOSIS — R4182 Altered mental status, unspecified: Secondary | ICD-10-CM

## 2020-03-24 DIAGNOSIS — Z794 Long term (current) use of insulin: Secondary | ICD-10-CM

## 2020-03-24 DIAGNOSIS — Z90710 Acquired absence of both cervix and uterus: Secondary | ICD-10-CM

## 2020-03-24 DIAGNOSIS — Z940 Kidney transplant status: Secondary | ICD-10-CM

## 2020-03-24 DIAGNOSIS — Z79899 Other long term (current) drug therapy: Secondary | ICD-10-CM

## 2020-03-24 DIAGNOSIS — Z20822 Contact with and (suspected) exposure to covid-19: Secondary | ICD-10-CM | POA: Diagnosis present

## 2020-03-24 DIAGNOSIS — F0390 Unspecified dementia without behavioral disturbance: Secondary | ICD-10-CM | POA: Diagnosis present

## 2020-03-24 DIAGNOSIS — F03918 Unspecified dementia, unspecified severity, with other behavioral disturbance: Secondary | ICD-10-CM

## 2020-03-24 LAB — CBC WITH DIFFERENTIAL/PLATELET
Abs Immature Granulocytes: 0.06 10*3/uL (ref 0.00–0.07)
Basophils Absolute: 0 10*3/uL (ref 0.0–0.1)
Basophils Relative: 0 %
Eosinophils Absolute: 0 10*3/uL (ref 0.0–0.5)
Eosinophils Relative: 0 %
HCT: 40.4 % (ref 36.0–46.0)
Hemoglobin: 13.4 g/dL (ref 12.0–15.0)
Immature Granulocytes: 1 %
Lymphocytes Relative: 9 %
Lymphs Abs: 0.9 10*3/uL (ref 0.7–4.0)
MCH: 26.4 pg (ref 26.0–34.0)
MCHC: 33.2 g/dL (ref 30.0–36.0)
MCV: 79.7 fL — ABNORMAL LOW (ref 80.0–100.0)
Monocytes Absolute: 0.5 10*3/uL (ref 0.1–1.0)
Monocytes Relative: 5 %
Neutro Abs: 8.4 10*3/uL — ABNORMAL HIGH (ref 1.7–7.7)
Neutrophils Relative %: 85 %
Platelets: 175 10*3/uL (ref 150–400)
RBC: 5.07 MIL/uL (ref 3.87–5.11)
RDW: 14.7 % (ref 11.5–15.5)
WBC: 9.9 10*3/uL (ref 4.0–10.5)
nRBC: 0 % (ref 0.0–0.2)

## 2020-03-24 LAB — CBG MONITORING, ED: Glucose-Capillary: 317 mg/dL — ABNORMAL HIGH (ref 70–99)

## 2020-03-24 MED ORDER — SODIUM CHLORIDE 0.9 % IV BOLUS
500.0000 mL | Freq: Once | INTRAVENOUS | Status: AC
  Administered 2020-03-24: 500 mL via INTRAVENOUS

## 2020-03-24 MED ORDER — ACETAMINOPHEN 500 MG PO TABS
1000.0000 mg | ORAL_TABLET | Freq: Once | ORAL | Status: AC
  Administered 2020-03-24: 1000 mg via ORAL
  Filled 2020-03-24: qty 2

## 2020-03-24 NOTE — ED Triage Notes (Signed)
Pt BIB EMS from [facility] initially for a fall with unsteady gait. Fell onto her bed due to wobbly gait. On arrival, EMS was told by facility that pt is hyperglycemic after receiving her insulin. HTN also noted by FD, takes Losartan. Pt c/o headache.   Hx dementia  CBG 390 210/94 88% RA, 96% 2L [place] (not on oxygen at baseline)  20G IV L AC

## 2020-03-24 NOTE — ED Provider Notes (Signed)
[facility] EMERGENCY DEPARTMENT Provider Note   CSN: [id number] Arrival date & time: 03/24/20  2220     History Chief Complaint  Patient presents with   Fall   Hyperglycemia    Emma Maldonado is a 70 y.o. female.  Patient with PMH of DM, HTN, renal disorder, and dementia is BIB EMS from [facility] for a fall.  She is unable to provide any history.  Nursing home staff tells me that she has not been herself today.  She normally is able to hold conversations, but she hasn't been talking today.  She hasn't been following commands.  Nursing home staff states that she has been "wobbly on her feet" today.  When asked if she has pain, she says "no."  The history is provided by the EMS personnel and the nursing home. No language interpreter was used.       Past Medical History:  Diagnosis Date   Dementia (HCC)    Diabetes mellitus without complication (HCC)    Hypertension    Renal disorder     Patient Active Problem List   Diagnosis Date Noted   Accelerated hypertension 03/28/2012   Viral gastroenteritis 03/28/2012   Sepsis (HCC) 03/26/2012   History of renal transplant 03/26/2012   Diabetes mellitus (HCC) 03/26/2012   Hyperlipidemia 03/26/2012    Past Surgical History:  Procedure Laterality Date   ABDOMINAL HYSTERECTOMY     CHOLECYSTECTOMY     NEPHRECTOMY TRANSPLANTED ORGAN       OB History   No obstetric history on file.     Family History  Problem Relation Age of Onset   Diabetes Mellitus II Father     Social History   Tobacco Use   Smoking status: Never Smoker  Substance Use Topics   Alcohol use: No   Drug use: No    Home Medications Prior to Admission medications   Medication Sig Start Date End Date Taking? Authorizing Provider  acetaminophen (TYLENOL) 325 MG tablet Take 1,300 mg by mouth 2 (two) times daily.    [provider]  calcium carbonate (OS-CAL) 600 MG TABS Take 600 mg by mouth 2  (two) times daily with a meal.    [provider]  cholecalciferol (VITAMIN D) 1000 UNITS tablet Take 1,000 Units by mouth daily.    [provider]  diclofenac sodium (VOLTAREN) 1 % GEL Apply 2 g topically 2 (two) times daily as needed (for shoulder pain).    [provider]  diphenoxylate-atropine (LOMOTIL) 2.5-0.025 MG per tablet Take 1 tablet by mouth 4 (four) times daily as needed for diarrhea or loose stools.    [provider]  docusate sodium (COLACE) 100 MG capsule Take 100 mg by mouth 2 (two) times daily.    [provider]  fludrocortisone (FLORINEF) 0.1 MG tablet Take 0.1 mg by mouth at bedtime.     [provider]  gabapentin (NEURONTIN) 100 MG capsule Take 200 mg by mouth 2 (two) times daily.    [provider]  insulin aspart (NOVOLOG) 100 UNIT/ML injection Inject 0-20 Units into the skin 4 (four) times daily - after meals and at bedtime. Sliding scale.    [provider]  insulin glargine (LANTUS) 100 UNIT/ML injection Inject 32 Units into the skin every evening. At 1700.    [provider]  magnesium oxide (MAG-OX) 400 MG tablet Take 800 mg by mouth 2 (two) times daily.    [provider]  memantine [facility])  10 MG tablet Take 10 mg by mouth 2 (two) times daily.    [provider]  midodrine (PROAMATINE) 5 MG tablet Take 10 mg by mouth 3 (three) times daily as needed (for pain).    [provider]  ondansetron (ZOFRAN-ODT) 4 MG disintegrating tablet Take 4 mg by mouth every 8 (eight) hours as needed for nausea.    [provider]  oxyCODONE (OXYCONTIN) 10 MG 12 hr tablet Take 10 mg by mouth every 12 (twelve) hours.    [provider]  predniSONE (DELTASONE) 5 MG tablet Take 5 mg by mouth every morning.    [provider]  sertraline (ZOLOFT) 25 MG tablet Take 25 mg by mouth every evening.    [provider]  simvastatin (ZOCOR) 10 MG tablet  Take 10 mg by mouth at bedtime.    [provider]  tacrolimus (PROGRAF) 0.5 MG capsule Take 1 capsule (0.5 mg total) by mouth daily. 03/28/12   Dhungel, Theda Belfast, MD    Allergies    Patient has no known allergies.  Review of Systems   Review of Systems  Unable to perform ROS: Mental status change    Physical Exam Updated Vital Signs BP (!) 216/100 (BP Location: Right Arm)    Pulse 94    Temp (!) 101.6 F (38.7 C) (Oral)    Resp 19    SpO2 94%   Physical Exam Vitals and nursing note reviewed.  Constitutional:      General: She is not in acute distress.    Appearance: She is well-developed.     Comments: Hard of hearing  HENT:     Head: Normocephalic and atraumatic.  Eyes:     Conjunctiva/sclera: Conjunctivae normal.  Cardiovascular:     Rate and Rhythm: Normal rate and regular rhythm.     Heart sounds: No murmur heard.   Pulmonary:     Effort: Pulmonary effort is normal. No respiratory distress.     Breath sounds: Normal breath sounds.  Abdominal:     Palpations: Abdomen is soft.     Tenderness: There is no abdominal tenderness.  Musculoskeletal:     Cervical back: Neck supple.  Skin:    General: Skin is warm and dry.  Neurological:     Mental Status: She is alert.     Comments: GCS 15  Psychiatric:     Comments: Unable to assess     ED Results / Procedures / Treatments   Labs (all labs ordered are listed, but only abnormal results are displayed) Labs Reviewed  CBG MONITORING, ED - Abnormal; Notable for the following components:      Result Value   Glucose-Capillary 317 (*)    All other components within normal limits  CULTURE, BLOOD (SINGLE)  URINE CULTURE  LACTIC ACID, PLASMA  LACTIC ACID, PLASMA  COMPREHENSIVE METABOLIC PANEL  CBC WITH DIFFERENTIAL/PLATELET  PROTIME-INR  APTT  URINALYSIS, ROUTINE W REFLEX MICROSCOPIC    EKG None  Radiology No results found.  Procedures Procedures   Medications Ordered in ED Medications - No data  to display  ED Course  I have reviewed the triage vital signs and the nursing notes.  Pertinent labs & imaging results that were available during my care of the patient were reviewed by me and considered in my medical decision making (see chart for details).    MDM Rules/Calculators/A&P  This patient complains of fall and generalized weakness, this involves an extensive number of treatment options, and is a complaint that carries with it a high risk of complications and morbidity.    Differential Dx Head injury, dehydration, electrolyte derangement, infection  Pertinent Labs I ordered, reviewed, and interpreted labs, which included urinalysis consistent with infection, laboratory work-up fairly reassuring.  Imaging Interpretation I ordered imaging studies which included chest x-ray and head CT, which showed no obvious abnormality.   Medications I ordered medication Rocephin for UTI.  Sources Additional history obtained from nursing home staff, who states that the patient fell twice, but did not hit her head.  The falls were slow slumping to the floor motions.  She has also been less responsive than normal today.   Critical Interventions  None  Reassessments After the interventions stated above, I reevaluated the patient and found unchanged.  Consultants Dr. Rachael Darby, who is appreciated for bringing the patient to the hospital.  Plan Admit    Final Clinical Impression(s) / ED Diagnoses Final diagnoses:  Acute cystitis without hematuria  Confusion    Rx / DC Orders ED Discharge Orders    None       Roxy Horseman, PA-C 03/25/20 0507    Jacalyn Lefevre, MD 03/27/20 [phone]

## 2020-03-25 ENCOUNTER — Encounter (HOSPITAL_COMMUNITY): Payer: Self-pay | Admitting: Family Medicine

## 2020-03-25 DIAGNOSIS — N3000 Acute cystitis without hematuria: Secondary | ICD-10-CM

## 2020-03-25 DIAGNOSIS — N390 Urinary tract infection, site not specified: Secondary | ICD-10-CM | POA: Diagnosis present

## 2020-03-25 DIAGNOSIS — F03918 Unspecified dementia, unspecified severity, with other behavioral disturbance: Secondary | ICD-10-CM

## 2020-03-25 DIAGNOSIS — Z20822 Contact with and (suspected) exposure to covid-19: Secondary | ICD-10-CM | POA: Diagnosis present

## 2020-03-25 DIAGNOSIS — F0390 Unspecified dementia without behavioral disturbance: Secondary | ICD-10-CM

## 2020-03-25 DIAGNOSIS — F0280 Dementia in other diseases classified elsewhere without behavioral disturbance: Secondary | ICD-10-CM

## 2020-03-25 DIAGNOSIS — G929 Unspecified toxic encephalopathy: Secondary | ICD-10-CM | POA: Diagnosis present

## 2020-03-25 DIAGNOSIS — E1165 Type 2 diabetes mellitus with hyperglycemia: Secondary | ICD-10-CM

## 2020-03-25 DIAGNOSIS — R4182 Altered mental status, unspecified: Secondary | ICD-10-CM

## 2020-03-25 DIAGNOSIS — G309 Alzheimer's disease, unspecified: Secondary | ICD-10-CM | POA: Diagnosis not present

## 2020-03-25 DIAGNOSIS — Z794 Long term (current) use of insulin: Secondary | ICD-10-CM | POA: Diagnosis not present

## 2020-03-25 DIAGNOSIS — E785 Hyperlipidemia, unspecified: Secondary | ICD-10-CM | POA: Diagnosis present

## 2020-03-25 DIAGNOSIS — R296 Repeated falls: Secondary | ICD-10-CM | POA: Diagnosis present

## 2020-03-25 DIAGNOSIS — I10 Essential (primary) hypertension: Secondary | ICD-10-CM

## 2020-03-25 DIAGNOSIS — E876 Hypokalemia: Secondary | ICD-10-CM

## 2020-03-25 DIAGNOSIS — Z79899 Other long term (current) drug therapy: Secondary | ICD-10-CM | POA: Diagnosis not present

## 2020-03-25 DIAGNOSIS — E119 Type 2 diabetes mellitus without complications: Secondary | ICD-10-CM | POA: Insufficient documentation

## 2020-03-25 DIAGNOSIS — R251 Tremor, unspecified: Secondary | ICD-10-CM | POA: Diagnosis present

## 2020-03-25 DIAGNOSIS — Z833 Family history of diabetes mellitus: Secondary | ICD-10-CM | POA: Diagnosis not present

## 2020-03-25 DIAGNOSIS — R410 Disorientation, unspecified: Secondary | ICD-10-CM

## 2020-03-25 DIAGNOSIS — Z940 Kidney transplant status: Secondary | ICD-10-CM | POA: Diagnosis not present

## 2020-03-25 DIAGNOSIS — Z90710 Acquired absence of both cervix and uterus: Secondary | ICD-10-CM | POA: Diagnosis not present

## 2020-03-25 DIAGNOSIS — H9190 Unspecified hearing loss, unspecified ear: Secondary | ICD-10-CM | POA: Diagnosis present

## 2020-03-25 DIAGNOSIS — B9620 Unspecified Escherichia coli [E. coli] as the cause of diseases classified elsewhere: Secondary | ICD-10-CM | POA: Diagnosis present

## 2020-03-25 LAB — PROCALCITONIN: Procalcitonin: 0.26 ng/mL

## 2020-03-25 LAB — COMPREHENSIVE METABOLIC PANEL
ALT: 15 U/L (ref 0–44)
AST: 16 U/L (ref 15–41)
Albumin: 3.4 g/dL — ABNORMAL LOW (ref 3.5–5.0)
Alkaline Phosphatase: 76 U/L (ref 38–126)
Anion gap: 8 (ref 5–15)
BUN: 9 mg/dL (ref 8–23)
CO2: 25 mmol/L (ref 22–32)
Calcium: 8.8 mg/dL — ABNORMAL LOW (ref 8.9–10.3)
Chloride: 103 mmol/L (ref 98–111)
Creatinine, Ser: 0.78 mg/dL (ref 0.44–1.00)
GFR, Estimated: >60 mL/min (ref >60)
Glucose, Bld: 329 mg/dL — ABNORMAL HIGH (ref 70–99)
Potassium: 3.3 mmol/L — ABNORMAL LOW (ref 3.5–5.1)
Sodium: 136 mmol/L (ref 135–145)
Total Bilirubin: 1.4 mg/dL — ABNORMAL HIGH (ref 0.3–1.2)
Total Protein: 6.5 g/dL (ref 6.5–8.1)

## 2020-03-25 LAB — URINALYSIS, ROUTINE W REFLEX MICROSCOPIC
Bilirubin Urine: NEGATIVE
Glucose, UA: >=500 mg/dL — AB
Ketones, ur: NEGATIVE mg/dL
Nitrite: NEGATIVE
Protein, ur: NEGATIVE mg/dL
Specific Gravity, Urine: 1.01 (ref 1.005–1.030)
WBC, UA: >50 WBC/hpf — ABNORMAL HIGH (ref 0–5)
pH: 5 (ref 5.0–8.0)

## 2020-03-25 LAB — PROTIME-INR
INR: 1.1 (ref 0.8–1.2)
Prothrombin Time: 13.4 seconds (ref 11.4–15.2)

## 2020-03-25 LAB — GLUCOSE, CAPILLARY
Glucose-Capillary: 337 mg/dL — ABNORMAL HIGH (ref 70–99)
Glucose-Capillary: 328 mg/dL — ABNORMAL HIGH (ref 70–99)
Glucose-Capillary: 199 mg/dL — ABNORMAL HIGH (ref 70–99)
Glucose-Capillary: 248 mg/dL — ABNORMAL HIGH (ref 70–99)

## 2020-03-25 LAB — MAGNESIUM: Magnesium: 1.5 mg/dL — ABNORMAL LOW (ref 1.7–2.4)

## 2020-03-25 LAB — APTT: aPTT: 31 seconds (ref 24–36)

## 2020-03-25 LAB — HEMOGLOBIN A1C
Hgb A1c MFr Bld: 9.9 % — ABNORMAL HIGH (ref 4.8–5.6)
Mean Plasma Glucose: 237.43 mg/dL

## 2020-03-25 LAB — CBC
HCT: 42.5 % (ref 36.0–46.0)
Hemoglobin: 13.6 g/dL (ref 12.0–15.0)
MCH: 26.3 pg (ref 26.0–34.0)
MCHC: 32 g/dL (ref 30.0–36.0)
MCV: 82 fL (ref 80.0–100.0)
Platelets: 164 10*3/uL (ref 150–400)
RBC: 5.18 MIL/uL — ABNORMAL HIGH (ref 3.87–5.11)
RDW: 14.7 % (ref 11.5–15.5)
WBC: 9.2 10*3/uL (ref 4.0–10.5)
nRBC: 0 % (ref 0.0–0.2)

## 2020-03-25 LAB — CREATININE, SERUM
Creatinine, Ser: 0.89 mg/dL (ref 0.44–1.00)
GFR, Estimated: >60 mL/min (ref >60)

## 2020-03-25 LAB — SARS CORONAVIRUS 2 (TAT 6-24 HRS): SARS Coronavirus 2: NEGATIVE

## 2020-03-25 LAB — HIV ANTIBODY (ROUTINE TESTING W REFLEX): HIV Screen 4th Generation wRfx: NONREACTIVE

## 2020-03-25 LAB — LACTIC ACID, PLASMA: Lactic Acid, Venous: 1.7 mmol/L (ref 0.5–1.9)

## 2020-03-25 LAB — BRAIN NATRIURETIC PEPTIDE: B Natriuretic Peptide: 417.6 pg/mL — ABNORMAL HIGH (ref 0.0–100.0)

## 2020-03-25 MED ORDER — INSULIN GLARGINE 100 UNIT/ML ~~LOC~~ SOLN
30.0000 [IU] | Freq: Two times a day (BID) | SUBCUTANEOUS | Status: DC
  Administered 2020-03-25 – 2020-03-29 (×9): 30 [IU] via SUBCUTANEOUS
  Filled 2020-03-25 (×10): qty 0.3

## 2020-03-25 MED ORDER — ONDANSETRON HCL 4 MG PO TABS: 4.0000 mg | ORAL_TABLET | Freq: Four times a day (QID) | ORAL | Status: DC | PRN

## 2020-03-25 MED ORDER — INSULIN ASPART 100 UNIT/ML ~~LOC~~ SOLN
0.0000 [IU] | Freq: Every day | SUBCUTANEOUS | Status: DC
  Administered 2020-03-25: 2 [IU] via SUBCUTANEOUS
  Administered 2020-03-28: 3 [IU] via SUBCUTANEOUS

## 2020-03-25 MED ORDER — SERTRALINE HCL 50 MG PO TABS
125.0000 mg | ORAL_TABLET | Freq: Every evening | ORAL | Status: DC
  Administered 2020-03-25 – 2020-03-28 (×4): 125 mg via ORAL
  Filled 2020-03-25 (×4): qty 3

## 2020-03-25 MED ORDER — INSULIN ASPART 100 UNIT/ML ~~LOC~~ SOLN
0.0000 [IU] | Freq: Three times a day (TID) | SUBCUTANEOUS | Status: DC
  Administered 2020-03-25 (×2): 11 [IU] via SUBCUTANEOUS
  Administered 2020-03-25: 4 [IU] via SUBCUTANEOUS
  Administered 2020-03-26 – 2020-03-27 (×3): 5 [IU] via SUBCUTANEOUS
  Administered 2020-03-27: 2 [IU] via SUBCUTANEOUS
  Administered 2020-03-28: 5 [IU] via SUBCUTANEOUS
  Administered 2020-03-28: 8 [IU] via SUBCUTANEOUS
  Administered 2020-03-29: 5 [IU] via SUBCUTANEOUS

## 2020-03-25 MED ORDER — SENNOSIDES-DOCUSATE SODIUM 8.6-50 MG PO TABS: 1.0000 | ORAL_TABLET | Freq: Every evening | ORAL | Status: DC | PRN

## 2020-03-25 MED ORDER — ONDANSETRON HCL 4 MG/2ML IJ SOLN: 4.0000 mg | Freq: Four times a day (QID) | INTRAMUSCULAR | Status: DC | PRN

## 2020-03-25 MED ORDER — ROSUVASTATIN CALCIUM 5 MG PO TABS
5.0000 mg | ORAL_TABLET | Freq: Every day | ORAL | Status: DC
  Administered 2020-03-25 – 2020-03-29 (×5): 5 mg via ORAL
  Filled 2020-03-25 (×5): qty 1

## 2020-03-25 MED ORDER — SODIUM CHLORIDE 0.9 % IV SOLN
1.0000 g | INTRAVENOUS | Status: DC
  Administered 2020-03-25 – 2020-03-26 (×2): 1 g via INTRAVENOUS
  Filled 2020-03-25: qty 1
  Filled 2020-03-25 (×2): qty 10

## 2020-03-25 MED ORDER — SODIUM CHLORIDE 0.9 % IV SOLN: 1.0000 g | INTRAVENOUS | Status: DC

## 2020-03-25 MED ORDER — ENOXAPARIN SODIUM 40 MG/0.4ML ~~LOC~~ SOLN
40.0000 mg | SUBCUTANEOUS | Status: DC
  Administered 2020-03-25 – 2020-03-29 (×5): 40 mg via SUBCUTANEOUS
  Filled 2020-03-25 (×5): qty 0.4

## 2020-03-25 MED ORDER — SODIUM CHLORIDE 0.9 % IV SOLN: INTRAVENOUS | Status: DC

## 2020-03-25 MED ORDER — INSULIN GLARGINE 100 UNIT/ML ~~LOC~~ SOLN
30.0000 [IU] | Freq: Every evening | SUBCUTANEOUS | Status: DC
  Filled 2020-03-25: qty 0.3

## 2020-03-25 MED ORDER — AMLODIPINE BESYLATE 10 MG PO TABS
10.0000 mg | ORAL_TABLET | Freq: Every day | ORAL | Status: DC
  Administered 2020-03-25 – 2020-03-28 (×4): 10 mg via ORAL
  Filled 2020-03-25 (×3): qty 1
  Filled 2020-03-25: qty 2

## 2020-03-25 MED ORDER — POTASSIUM CHLORIDE 2 MEQ/ML IV SOLN
INTRAVENOUS | Status: AC
  Filled 2020-03-25 (×3): qty 1000

## 2020-03-25 MED ORDER — ACETAMINOPHEN 325 MG PO TABS
650.0000 mg | ORAL_TABLET | Freq: Four times a day (QID) | ORAL | Status: DC | PRN
  Administered 2020-03-25: 650 mg via ORAL
  Filled 2020-03-25: qty 2

## 2020-03-25 MED ORDER — SODIUM CHLORIDE 0.9 % IV SOLN
1.0000 g | Freq: Once | INTRAVENOUS | Status: AC
  Administered 2020-03-25: 1 g via INTRAVENOUS
  Filled 2020-03-25: qty 10

## 2020-03-25 MED ORDER — SIMVASTATIN 20 MG PO TABS: 10.0000 mg | ORAL_TABLET | Freq: Every day | ORAL | Status: DC

## 2020-03-25 MED ORDER — MEMANTINE HCL 10 MG PO TABS
10.0000 mg | ORAL_TABLET | Freq: Two times a day (BID) | ORAL | Status: DC
  Administered 2020-03-25 – 2020-03-29 (×8): 10 mg via ORAL
  Filled 2020-03-25 (×11): qty 1

## 2020-03-25 MED ORDER — MAGNESIUM SULFATE 2 GM/50ML IV SOLN
2.0000 g | Freq: Once | INTRAVENOUS | Status: AC
  Administered 2020-03-25: 2 g via INTRAVENOUS
  Filled 2020-03-25: qty 50

## 2020-03-25 MED ORDER — POTASSIUM CHLORIDE CRYS ER 20 MEQ PO TBCR
20.0000 meq | EXTENDED_RELEASE_TABLET | Freq: Once | ORAL | Status: AC
  Administered 2020-03-25: 20 meq via ORAL
  Filled 2020-03-25: qty 1

## 2020-03-25 MED ORDER — ACETAMINOPHEN 650 MG RE SUPP: 650.0000 mg | Freq: Four times a day (QID) | RECTAL | Status: DC | PRN

## 2020-03-25 MED ORDER — HYDRALAZINE HCL 20 MG/ML IJ SOLN
10.0000 mg | Freq: Four times a day (QID) | INTRAMUSCULAR | Status: DC | PRN
  Administered 2020-03-25: 10 mg via INTRAVENOUS
  Filled 2020-03-25: qty 1

## 2020-03-25 NOTE — Evaluation (Signed)
Physical Therapy Evaluation Patient Details Name: Emma Maldonado MRN: [id number] DOB: February 12, 1950 Today's Date: 03/25/2020   History of Present Illness  Pt is a 70 y/o female admitted 3/13 secondary to fall and AMS. Found to have encephalopathy secondary to UTI. PMH includes dementia, HTN, and DM.  Clinical Impression  Pt admitted secondary to problem above with deficits below. Pt reporting increased BLE pain which limited mobility. Dementia at baseline and only oriented to person. Pt with LOB X 2 when taking steps at EOB and required mod A for steadying. Per notes, pt from SNF. Recommend return to SNF at d/c. Will continue to follow acutely.     Follow Up Recommendations SNF;Supervision/Assistance - 24 hour    Equipment Recommendations  None recommended by PT    Recommendations for Other Services       Precautions / Restrictions Precautions Precautions: Fall Restrictions Weight Bearing Restrictions: No      Mobility  Bed Mobility Overal bed mobility: Needs Assistance Bed Mobility: Supine to Sit;Sit to Supine     Supine to sit: Supervision Sit to supine: Supervision   General bed mobility comments: Supervision for safety.    Transfers Overall transfer level: Needs assistance Equipment used: None Transfers: Sit to/from Stand Sit to Stand: Mod assist         General transfer comment: Mod A for lift assist and steadying to stand. PT stood in front of pt and had pt hold to PT arms.  Ambulation/Gait Ambulation/Gait assistance: Mod assist           General Gait Details: Pt taking side steps at EOB. LOB X2 requiring mod A for steadying. Pt reporting increased pain in BLE, so further mobility deferred.  Stairs            Wheelchair Mobility    Modified Rankin (Stroke Patients Only)       Balance Overall balance assessment: Needs assistance Sitting-balance support: No upper extremity supported;Feet supported Sitting balance-Leahy Scale: Fair      Standing balance support: Bilateral upper extremity supported;During functional activity Standing balance-Leahy Scale: Poor Standing balance comment: Reliant on BUE and external support                             Pertinent Vitals/Pain Pain Assessment: Faces Faces Pain Scale: Hurts little more Pain Location: BLE Pain Descriptors / Indicators: Aching;Grimacing;Guarding Pain Intervention(s): Monitored during session;Limited activity within patient's tolerance;Repositioned    Home Living Family/patient expects to be discharged to:: Skilled nursing facility                 Additional Comments: Per notes, from [facility]    Prior Function           Comments: Unsure of baseline as pt unable to report. Anticipate staff had to assist with mobility and ADL tasks.     Hand Dominance        Extremity/Trunk Assessment   Upper Extremity Assessment Upper Extremity Assessment: Defer to OT evaluation    Lower Extremity Assessment Lower Extremity Assessment: Generalized weakness    Cervical / Trunk Assessment Cervical / Trunk Assessment: Kyphotic  Communication   Communication: HOH  Cognition Arousal/Alertness: Awake/alert Behavior During Therapy: WFL for tasks assessed/performed Overall Cognitive Status: No family/caregiver present to determine baseline cognitive functioning  General Comments: Only oriented to person. Unable to report PLOF or where she lived. Slow processing. Dementia at baseline.      General Comments      Exercises     Assessment/Plan    PT Assessment Patient needs continued PT services  PT Problem List Decreased strength;Decreased balance;Decreased activity tolerance;Decreased mobility;Decreased cognition;Decreased knowledge of use of DME;Decreased safety awareness;Decreased knowledge of precautions       PT Treatment Interventions Gait training;DME instruction;Therapeutic  activities;Functional mobility training;Therapeutic exercise;Balance training;Patient/family education;Wheelchair mobility training    PT Goals (Current goals can be found in the Care Plan section)  Acute Rehab PT Goals Patient Stated Goal: for legs to stop hurting PT Goal Formulation: With patient Time For Goal Achievement: 04/08/20 Potential to Achieve Goals: Good    Frequency Min 2X/week   Barriers to discharge        Co-evaluation               AM-PAC PT "6 Clicks" Mobility  Outcome Measure Help needed turning from your back to your side while in a flat bed without using bedrails?: None Help needed moving from lying on your back to sitting on the side of a flat bed without using bedrails?: A Little Help needed moving to and from a bed to a chair (including a wheelchair)?: A Lot Help needed standing up from a chair using your arms (e.g., wheelchair or bedside chair)?: A Lot Help needed to walk in hospital room?: A Lot Help needed climbing 3-5 steps with a railing? : Total 6 Click Score: 14    End of Session Equipment Utilized During Treatment: Gait belt Activity Tolerance: Patient limited by pain Patient left: in bed;with call bell/phone within reach;with bed alarm set Nurse Communication: Mobility status PT Visit Diagnosis: Unsteadiness on feet (R26.81);Muscle weakness (generalized) (M62.81);History of falling (Z91.81)    Time: 3382-5053 PT Time Calculation (min) (ACUTE ONLY): 11 min   Charges:   PT Evaluation $PT Eval Moderate Complexity: 1 Mod          Farley Ly, PT, DPT  Acute Rehabilitation Services  Pager: [phone] Office: [phone]   Lehman Prom 03/25/2020, 1:31 PM

## 2020-03-25 NOTE — Progress Notes (Addendum)
PROGRESS NOTE                                                                                                                                                                                                             Patient Demographics:    Emma Maldonado, is a 70 y.o. female, DOB - Aug 09, 1950, IRJ:[id number]  Outpatient Primary MD for the patient is Patient, No Pcp Per    LOS - 0  Admit date - 03/24/2020    Chief Complaint  Patient presents with   Fall   Hyperglycemia       Brief Narrative (HPI from H&P)  Emma Maldonado is a 70 y.o. female with medical history significant for DM, HTN, renal and dementia presents by EMS from [facility] for a fall, apparently she has been getting gradually more confused and has had a few falls at [facility] for the last few days.  In the ER head CT was negative, her exam was nonfocal, she had evidence of UTI induced toxic encephalopathy on top of underlying dementia and admitted to the hospital.   Subjective:    Emma Maldonado today has, No headache, No chest pain, No abdominal pain - No Nausea, No new weakness tingling or numbness, no SOB.   Assessment  & Plan :     1. Toxic encephalopathy in a patient with underlying dementia due to UTI. She has a nonfocal exam, no headache continue to the line supportive care with antibiotics and hydration, PT/OT.  2. UTI - on empiric Rocephin follow C&S.  3. Dementia. She is on memantine & sertraline currently will monitor.  4. Hypertension.  Currently on Norvasc.  5.  Dyslipidemia - continue statin.  6.  Hypokalemia - replaced.  7. DM2 - on Lantus and sliding scale dose adjusted, will continue to monitor.  Lab Results  Component Value Date   HGBA1C 9.9 (H) 03/25/2020   CBG (last 3)  Recent Labs    03/24/20 2224 03/25/20 0805  GLUCAP 317* 337*         Condition - Extremely Guarded  Family Communication  :   Called the  listed phone number for daughter Gardiner Ramus [phone] on 03/25/2020 at 10:25 AM.  No response.  Husband Renae Fickle on [phone] on 03/25/20   Code Status :  Full  Consults  :  None  PUD Prophylaxis :    Procedures  :     CT head.  Nonacute.      Disposition Plan  :    Status is: Inpatient  Remains inpatient appropriate because:IV treatments appropriate due to intensity of illness or inability to take PO   Dispo: The patient is from: Home              Anticipated d/c is to: Home              Patient currently is not medically stable to d/c.   Difficult to place patient No  DVT Prophylaxis  :  Lovenox    Lab Results  Component Value Date   PLT 164 03/25/2020    Diet :  Diet Order            Diet heart healthy/carb modified Room service appropriate? Yes; Fluid consistency: Thin  Diet effective now                  Inpatient Medications  Scheduled Meds:  amLODipine  10 mg Oral Daily   enoxaparin (LOVENOX) injection  40 mg Subcutaneous Q24H   insulin aspart  0-15 Units Subcutaneous TID WC   insulin aspart  0-5 Units Subcutaneous QHS   insulin glargine  30 Units Subcutaneous QPM   memantine  10 mg Oral BID   rosuvastatin  5 mg Oral Daily   sertraline  125 mg Oral QPM   Continuous Infusions:  cefTRIAXone (ROCEPHIN)  IV     lactated ringers with kcl 100 mL/hr at 03/25/20 9702   magnesium sulfate bolus IVPB     PRN Meds:.acetaminophen **OR** acetaminophen, hydrALAZINE, [DISCONTINUED] ondansetron **OR** ondansetron (ZOFRAN) IV, senna-docusate  Antibiotics  :    Anti-infectives (From admission, onward)   Start     Dose/Rate Route Frequency Ordered Stop   03/25/20 2200  cefTRIAXone (ROCEPHIN) 1 g in sodium chloride 0.9 % 100 mL IVPB        1 g 200 mL/hr over 30 Minutes Intravenous Every 24 hours 03/25/20 0621     03/25/20 0615  cefTRIAXone (ROCEPHIN) 1 g in sodium chloride 0.9 % 100 mL IVPB  Status:  Discontinued        1 g 200 mL/hr over 30 Minutes  Intravenous Every 24 hours 03/25/20 0604 03/25/20 0620   03/25/20 0400  cefTRIAXone (ROCEPHIN) 1 g in sodium chloride 0.9 % 100 mL IVPB        1 g 200 mL/hr over 30 Minutes Intravenous  Once 03/25/20 0352 03/25/20 0603       Time Spent in minutes  30   Susa Raring M.D on 03/25/2020 at 10:26 AM  To page go to [email or web address]   [facility] -  Office  [phone]    See all Orders from today for further details    Objective:   Vitals:   03/25/20 0726 03/25/20 0750 03/25/20 0753 03/25/20 0953  BP: (!) 203/77  (!) 230/87 (!) 132/98  Pulse: 95  89 90  Resp: 20  20 18   Temp: 98.8 F (37.1 C)  98.6 F (37 C) 98.7 F (37.1 C)  TempSrc: Oral  Oral Oral  SpO2: 97%  94% 95%  Weight:  68.4 kg    Height:  5\' 2"  (1.575 m)      Wt Readings from Last 3 Encounters:  03/25/20 68.4 kg  03/12/20 64 kg  03/27/12 63.8 kg     Intake/Output Summary (Last 24 hours) at 03/25/2020 1026 Last  data filed at 03/25/2020 0846 Gross per 24 hour  Intake 600 ml  Output 1 ml  Net 599 ml     Physical Exam  Awake, mildly confused, following basic no apparent focal deficit [place].AT,PERRAL Supple Neck,No JVD, No cervical lymphadenopathy appriciated.  Symmetrical Chest wall movement, Good air movement bilaterally, CTAB RRR,No Gallops,Rubs or new Murmurs, No Parasternal Heave +ve B.Sounds, Abd Soft, No tenderness, No organomegaly appriciated, No rebound - guarding or rigidity. No Cyanosis, Clubbing or edema, No new Rash or bruise       Data Review:    CBC Recent Labs  Lab 03/24/20 2330 03/25/20 0730  WBC 9.9 9.2  HGB 13.4 13.6  HCT 40.4 42.5  PLT 175 164  MCV 79.7* 82.0  MCH 26.4 26.3  MCHC 33.2 32.0  RDW 14.7 14.7  LYMPHSABS 0.9  --   MONOABS 0.5  --   EOSABS 0.0  --   BASOSABS 0.0  --     Recent Labs  Lab 03/24/20 2330 03/25/20 0724 03/25/20 0730  NA 136  --   --   K 3.3*  --   --   CL 103  --   --   CO2 25  --   --   GLUCOSE 329*  --   --   BUN 9  --   --    CREATININE 0.78  --  0.89  CALCIUM 8.8*  --   --   AST 16  --   --   ALT 15  --   --   ALKPHOS 76  --   --   BILITOT 1.4*  --   --   ALBUMIN 3.4*  --   --   MG  --   --  1.5*  PROCALCITON  --   --  0.26  LATICACIDVEN 1.7  --   --   INR 1.1  --   --   HGBA1C  --   --  9.9*  BNP  --  417.6*  --     ------------------------------------------------------------------------------------------------------------------ No results for input(s): CHOL, HDL, LDLCALC, TRIG, CHOLHDL, LDLDIRECT in the last 72 hours.  Lab Results  Component Value Date   HGBA1C 9.9 (H) 03/25/2020   ------------------------------------------------------------------------------------------------------------------ No results for input(s): TSH, T4TOTAL, T3FREE, THYROIDAB in the last 72 hours.  Invalid input(s): FREET3  Cardiac Enzymes No results for input(s): CKMB, TROPONINI, MYOGLOBIN in the last 168 hours.  Invalid input(s): CK ------------------------------------------------------------------------------------------------------------------    Component Value Date/Time   BNP 417.6 (H) 03/25/2020 2025    Micro Results Recent Results (from the past 240 hour(s))  SARS CORONAVIRUS 2 (TAT 6-24 HRS) Nasopharyngeal Nasopharyngeal Swab     Status: None   Collection Time: 03/24/20 11:40 PM   Specimen: Nasopharyngeal Swab  Result Value Ref Range Status   SARS Coronavirus 2 NEGATIVE NEGATIVE Final    Comment: (NOTE) SARS-CoV-2 target nucleic acids are NOT DETECTED.  The SARS-CoV-2 RNA is generally detectable in upper and lower respiratory specimens during the acute phase of infection. Negative results do not preclude SARS-CoV-2 infection, do not rule out co-infections with other pathogens, and should not be used as the sole basis for treatment or other patient management decisions. Negative results must be combined with clinical observations, patient history, and epidemiological information. The  expected result is Negative.  Fact Sheet for Patients: HairSlick.no  Fact Sheet for Healthcare Providers: quierodirigir.com  This test is not yet approved or cleared by the [place] FDA and  has been authorized for  detection and/or diagnosis of SARS-CoV-2 by FDA under an Emergency Use Authorization (EUA). This EUA will remain  in effect (meaning this test can be used) for the duration of the COVID-19 declaration under Se ction 564(b)(1) of the Act, 21 U.S.C. section 360bbb-3(b)(1), unless the authorization is terminated or revoked sooner.  Performed at [facility], [address] [place], [place] [address]     Radiology Reports DG Chest 2 View  Result Date: 03/12/2020 CLINICAL DATA:  Chest pain EXAM: CHEST - 2 VIEW COMPARISON:  05/06/2019 FINDINGS: There are streaky airspace opacities at the lung bases favored to represent areas of atelectasis. There is no large pleural effusion or pneumothorax. The heart size is unremarkable. Aortic calcifications are noted. IMPRESSION: No active cardiopulmonary disease. Electronically Signed   By: Katherine Mantlehristopher  Green M.D.   On: 03/12/2020 19:56   CT Head Wo Contrast  Result Date: 03/24/2020 CLINICAL DATA:  Facial trauma, dementia EXAM: CT HEAD WITHOUT CONTRAST TECHNIQUE: Contiguous axial images were obtained from the base of the skull through the vertex without intravenous contrast. COMPARISON:  None. FINDINGS: Brain: No acute infarct or hemorrhage. Lateral ventricles and midline structures are unremarkable. No acute extra-axial fluid collections. No mass effect. Vascular: Moderate atherosclerosis of the internal carotid arteries. No hyperdense vessel. Skull: Normal. Negative for fracture or focal lesion. Sinuses/Orbits: No acute finding. Other: None. IMPRESSION: 1. No acute intracranial process. Electronically Signed   By: Sharlet SalinaMichael  Brown M.D.   On: 03/24/2020 23:08   DG Chest Port 1  View  Result Date: 03/24/2020 CLINICAL DATA:  Larey SeatFell, hyperglycemia, sepsis EXAM: PORTABLE CHEST 1 VIEW COMPARISON:  03/12/2020 FINDINGS: The heart size and mediastinal contours are within normal limits. Both lungs are clear. The visualized skeletal structures are unremarkable. IMPRESSION: No active disease. Electronically Signed   By: Sharlet SalinaMichael  Brown M.D.   On: 03/24/2020 22:52

## 2020-03-25 NOTE — ED Notes (Signed)
RN attempted to call report x1 

## 2020-03-25 NOTE — H&P (Signed)
History and Physical    Emma Maldonado ONG:[id number] DOB: 1950/08/31 DOA: 03/24/2020  PCP: Patient, No Pcp Per   Patient coming from: SNF  Chief Complaint: Fall at facility.  Patient is more confused than her normal baseline  HPI: Emma Maldonado is a 70 y.o. female with medical history significant for DM, HTN, renal and dementia presents by EMS from [facility] for a fall.  She is unable to provide any history. ER provider discussed with SNF staff who reported that pt has not been acting in her normal manner today and had two falls that were witnessed. No head injury. No LOC.  She normally is able to hold conversations, but she hasn't been talking today.  She hasn't been following commands.  Nursing home staff states that she has been "wobbly on her feet" today.  When asked if she has pain, she says "no." She does wince when her lower abdomen over her bladder is palpated on exam.  She is found to have UTI by UA. Her blood glucose is elevated on labs. No fever, hypoxia. No vomiting or diarrhea reported.    Review of Systems:  Accurate review of systems cannot be obtained secondary to dementia  Past Medical History:  Diagnosis Date  . Dementia (HCC)   . Diabetes mellitus without complication (HCC)   . Hypertension   . Renal disorder     Past Surgical History:  Procedure Laterality Date  . ABDOMINAL HYSTERECTOMY    . CHOLECYSTECTOMY    . NEPHRECTOMY TRANSPLANTED ORGAN      Social History  reports that she has never smoked. She does not have any smokeless tobacco history on file. She reports that she does not drink alcohol and does not use drugs.  No Known Allergies  Family History  Problem Relation Age of Onset  . Diabetes Mellitus II Father      Prior to Admission medications   Medication Sig Start Date End Date Taking? Authorizing Provider  acetaminophen (TYLENOL) 325 MG tablet Take 1,300 mg by mouth 2 (two) times daily.    [provider]  calcium  carbonate (OS-CAL) 600 MG TABS Take 600 mg by mouth 2 (two) times daily with a meal.    [provider]  cholecalciferol (VITAMIN D) 1000 UNITS tablet Take 1,000 Units by mouth daily.    [provider]  diclofenac sodium (VOLTAREN) 1 % GEL Apply 2 g topically 2 (two) times daily as needed (for shoulder pain).    [provider]  diphenoxylate-atropine (LOMOTIL) 2.5-0.025 MG per tablet Take 1 tablet by mouth 4 (four) times daily as needed for diarrhea or loose stools.    [provider]  docusate sodium (COLACE) 100 MG capsule Take 100 mg by mouth 2 (two) times daily.    [provider]  fludrocortisone (FLORINEF) 0.1 MG tablet Take 0.1 mg by mouth at bedtime.     [provider]  gabapentin (NEURONTIN) 100 MG capsule Take 200 mg by mouth 2 (two) times daily.    [provider]  insulin aspart (NOVOLOG) 100 UNIT/ML injection Inject 0-20 Units into the skin 4 (four) times daily - after meals and at bedtime. Sliding scale.    [provider]  insulin glargine (LANTUS) 100 UNIT/ML injection Inject 32 Units into the skin every evening. At 1700.    [provider]  magnesium oxide (MAG-OX) 400 MG tablet Take 800 mg by mouth 2 (two) times daily.    [provider]  memantine (NAMENDA) 10 MG tablet Take 10 mg by mouth 2 (two) times daily.    [provider]  midodrine (PROAMATINE) 5 MG tablet Take 10 mg by mouth 3 (three) times daily as needed (for pain).    [provider]  ondansetron (ZOFRAN-ODT) 4 MG disintegrating tablet Take 4 mg by mouth every 8 (eight) hours as needed for nausea.    [provider]  oxyCODONE (OXYCONTIN) 10 MG 12 hr tablet Take 10 mg by mouth every 12 (twelve) hours.    [provider]  predniSONE (DELTASONE) 5 MG tablet Take 5 mg by mouth every morning.    [provider]  sertraline (ZOLOFT) 25 MG tablet Take 25 mg by mouth every evening.     [provider]  simvastatin (ZOCOR) 10 MG tablet Take 10 mg by mouth at bedtime.    [provider]  tacrolimus (PROGRAF) 0.5 MG capsule Take 1 capsule (0.5 mg total) by mouth daily. 03/28/12   Eddie Northhungel, Nishant, MD    Physical Exam: Vitals:   03/25/20 0330 03/25/20 0400 03/25/20 0415 03/25/20 0523  BP: 134/62 122/64 (!) 152/68 (!) 150/70  Pulse: 72 71 72 70  Resp: 15 18 17 19   Temp:      TempSrc:      SpO2: 93% 92% 95% 93%    Constitutional: NAD, calm, comfortable Vitals:   03/25/20 0330 03/25/20 0400 03/25/20 0415 03/25/20 0523  BP: 134/62 122/64 (!) 152/68 (!) 150/70  Pulse: 72 71 72 70  Resp: 15 18 17 19   Temp:      TempSrc:      SpO2: 93% 92% 95% 93%   General: WDWN, Alert and oriented to self. .  Eyes: EOMI, PERRL,  conjunctivae normal.  Sclera nonicteric HENT:  [place]/AT, external ears normal.  Nares patent without epistasis.  Mucous membranes are moist Neck: Soft, normal range of motion, supple, no masses, Trachea midline Respiratory: clear to auscultation bilaterally, no wheezing, no crackles. Normal respiratory effort. No accessory muscle use.  Cardiovascular: Regular rate and rhythm, no murmurs / rubs / gallops. No extremity edema. 1+ pedal pulses. Abdomen: Soft, Suprapubic tenderness, nondistended, no rebound or guarding. No CVA tenderness. No masses palpated. Bowel sounds normoactive Musculoskeletal: FROM. no cyanosis. No joint deformity upper and lower extremities. Normal muscle tone.  Skin: Warm, dry, intact no rashes, lesions, ulcers. No induration Neurologic: CN 2-12 grossly intact.  Normal speech. patella DTR +1 bilaterally. Strength 5/5 in all extremities.   Psychiatric: Pleasant. Normal mood.    Labs on Admission: I have personally reviewed following labs and imaging studies  CBC: Recent Labs  Lab 03/24/20 2330  WBC 9.9  NEUTROABS 8.4*  HGB 13.4  HCT 40.4  MCV 79.7*  PLT 175    Basic Metabolic Panel: Recent Labs  Lab  03/24/20 2330  NA 136  K 3.3*  CL 103  CO2 25  GLUCOSE 329*  BUN 9  CREATININE 0.78  CALCIUM 8.8*    GFR: Estimated Creatinine Clearance: 57.5 mL/min (by C-G formula based on SCr of 0.78 mg/dL).  Liver Function Tests: Recent Labs  Lab 03/24/20 2330  AST 16  ALT 15  ALKPHOS 76  BILITOT 1.4*  PROT 6.5  ALBUMIN 3.4*    Urine analysis:    Component Value Date/Time   COLORURINE YELLOW 03/25/2020 0316   APPEARANCEUR CLOUDY (A) 03/25/2020 0316   LABSPEC 1.010 03/25/2020 0316   PHURINE 5.0 03/25/2020 0316   GLUCOSEU >=500 (A) 03/25/2020 0316   HGBUR  SMALL (A) 03/25/2020 0316   BILIRUBINUR NEGATIVE 03/25/2020 0316   KETONESUR NEGATIVE 03/25/2020 0316   PROTEINUR NEGATIVE 03/25/2020 0316   UROBILINOGEN 1.0 08/14/2012 1022   NITRITE NEGATIVE 03/25/2020 0316   LEUKOCYTESUR LARGE (A) 03/25/2020 0316    Radiological Exams on Admission: CT Head Wo Contrast  Result Date: 03/24/2020 CLINICAL DATA:  Facial trauma, dementia EXAM: CT HEAD WITHOUT CONTRAST TECHNIQUE: Contiguous axial images were obtained from the base of the skull through the vertex without intravenous contrast. COMPARISON:  None. FINDINGS: Brain: No acute infarct or hemorrhage. Lateral ventricles and midline structures are unremarkable. No acute extra-axial fluid collections. No mass effect. Vascular: Moderate atherosclerosis of the internal carotid arteries. No hyperdense vessel. Skull: Normal. Negative for fracture or focal lesion. Sinuses/Orbits: No acute finding. Other: None. IMPRESSION: 1. No acute intracranial process. Electronically Signed   By: Sharlet Salina M.D.   On: 03/24/2020 23:08   DG Chest Port 1 View  Result Date: 03/24/2020 CLINICAL DATA:  Larey Seat, hyperglycemia, sepsis EXAM: PORTABLE CHEST 1 VIEW COMPARISON:  03/12/2020 FINDINGS: The heart size and mediastinal contours are within normal limits. Both lungs are clear. The visualized skeletal structures are unremarkable. IMPRESSION: No active disease.  Electronically Signed   By: Sharlet Salina M.D.   On: 03/24/2020 22:52    EKG: Independently reviewed.  He shows normal sinus rhythm with no acute ST elevation or depression.  QTc 440  Assessment/Plan Principal Problem:   UTI (urinary tract infection) Emma Maldonado is admitted to med/surg floor. Started on Rocephin for antibiotic coverage. Cultures obtain the emergency room and will be monitored. IV fluid hydration with normal saline  Active Problems:   Hypokalemia Magnesium level.  Potassium supplementation provided. Recheck electrolytes renal function morning    Essential hypertension Per chart patient has a history of hypertension.  Is currently not on any antihypertensive medication.  Monitor blood pressure and will make medication recommendations if blood pressure remains above goal of 140/90.    Uncontrolled type 2 diabetes mellitus with hyperglycemia  Continue basal insulin with Lantus.  Blood sugars will be monitored with meals and at bedtime.  Sliding scale insulin provide as needed for glycemic control.  Hemoglobin A1c Heart healthy low-carb diet ordered    Altered mental status Patient with increased confusion not at her baseline according to report from SNF is most likely secondary to UTI    Dementia  Chronic.  Continue Namenda   DVT prophylaxis: Lovenox for DVT prophylaxis.  Code Status:   Full code  Family Communication:  No family at bedside.  No one from SNF at bedside Disposition Plan:   Patient is from:  SNF  Anticipated DC to:  SNF  Anticipated DC date:  Anticipate 2 midnight stay in the hospital to treat acute condition  Anticipated DC barriers: No barriers to discharge identified at this time  Admission status:   Inpatient    Claudean Severance Chotiner MD [facility]  How to contact the [facility] Attending or Consulting provider 7A - 7P or covering provider during after hours 7P -7A, for this patient?   1. Check the care team in [facility] and look for a)  attending/consulting TR[facility] provider listed and b) the [facility] team listed 2. Log into [email or web address] and use [place]'s universal password to access. If you do not have the password, please contact the hospital operator. 3. Locate the [facility] provider you are looking for under [facility] and page to a number that you can be directly reached. 4. If you  still have difficulty reaching the provider, please page the [facility] (Director on Call) for the Hospitalists listed on amion for assistance.  03/25/2020, 5:51 AM

## 2020-03-25 NOTE — Progress Notes (Signed)
Inpatient Diabetes Program Recommendations  AACE/ADA: New Consensus Statement on Inpatient Glycemic Control (2015)  Target Ranges:  Prepandial:   less than 140 mg/dL      Peak postprandial:   less than 180 mg/dL (1-2 hours)      Critically ill patients:  140 - 180 mg/dL   Lab Results  Component Value Date   GLUCAP 337 (H) 03/25/2020   HGBA1C 9.9 (H) 03/25/2020    Review of Glycemic Control  Results for Emma Maldonado, Emma Maldonado (MRN [id number]) as of 03/25/2020 10:25  Ref. Range 03/24/2020 22:24 03/25/2020 08:05  Glucose-Capillary Latest Ref Range: 70 - 99 mg/dL 440 (H) 102 (H)   Diabetes history: DM Outpatient Diabetes medications: Lantus 50 units daily & Novolog 22 units TID Current orders for Inpatient glycemic control: Lantus 30 units QHS & Novolog 0-15 units TID & 0-5 units QHS  Inpatient Diabetes Program Recommendations:     Please consider:  -Administer Lantus 30 units QAM (last dose was 0730 03/24/20 per med rec) -Novolog 10 units TID with meals if eats at least 50%  Will continue to follow while inpatient.  Thank you, Dulce Sellar, RN, BSN Diabetes Coordinator Inpatient Diabetes Program [phone] (team pager from 8a-5p)

## 2020-03-25 NOTE — Progress Notes (Signed)
NEW ADMISSION NOTE New Admission Note:   Arrival Method: stretcher Mental Orientation: A&O X 2 Telemetry: 5M09 Assessment: Completed Skin: intact scratch left leg IV: RAC Pain: 0/10 Tubes: N/A Safety Measures: Safety Fall Prevention Plan has been given, discussed and signed Admission: Completed [facility] [place] Orientation: Patient has been orientated to the room, unit and staff.  Family: none   Orders have been reviewed and implemented. Will continue to monitor the patient. Call light has been placed within reach and bed alarm has been activated.   Caylah Plouff S Marselino Slayton, RN

## 2020-03-26 LAB — GLUCOSE, CAPILLARY
Glucose-Capillary: 217 mg/dL — ABNORMAL HIGH (ref 70–99)
Glucose-Capillary: 205 mg/dL — ABNORMAL HIGH (ref 70–99)
Glucose-Capillary: 87 mg/dL (ref 70–99)
Glucose-Capillary: 138 mg/dL — ABNORMAL HIGH (ref 70–99)

## 2020-03-26 LAB — BASIC METABOLIC PANEL
Anion gap: 8 (ref 5–15)
BUN: 8 mg/dL (ref 8–23)
CO2: 23 mmol/L (ref 22–32)
Calcium: 8.2 mg/dL — ABNORMAL LOW (ref 8.9–10.3)
Chloride: 106 mmol/L (ref 98–111)
Creatinine, Ser: 0.64 mg/dL (ref 0.44–1.00)
GFR, Estimated: >60 mL/min (ref >60)
Glucose, Bld: 254 mg/dL — ABNORMAL HIGH (ref 70–99)
Potassium: 3.8 mmol/L (ref 3.5–5.1)
Sodium: 137 mmol/L (ref 135–145)

## 2020-03-26 LAB — CBC
HCT: 39.5 % (ref 36.0–46.0)
Hemoglobin: 12.7 g/dL (ref 12.0–15.0)
MCH: 26.3 pg (ref 26.0–34.0)
MCHC: 32.2 g/dL (ref 30.0–36.0)
MCV: 82 fL (ref 80.0–100.0)
Platelets: 132 10*3/uL — ABNORMAL LOW (ref 150–400)
RBC: 4.82 MIL/uL (ref 3.87–5.11)
RDW: 14.9 % (ref 11.5–15.5)
WBC: 9.1 10*3/uL (ref 4.0–10.5)
nRBC: 0 % (ref 0.0–0.2)

## 2020-03-26 LAB — BRAIN NATRIURETIC PEPTIDE: B Natriuretic Peptide: 590.7 pg/mL — ABNORMAL HIGH (ref 0.0–100.0)

## 2020-03-26 LAB — PROCALCITONIN: Procalcitonin: 1.46 ng/mL

## 2020-03-26 LAB — MAGNESIUM: Magnesium: 1.9 mg/dL (ref 1.7–2.4)

## 2020-03-26 MED ORDER — TAMSULOSIN HCL 0.4 MG PO CAPS
0.4000 mg | ORAL_CAPSULE | Freq: Every day | ORAL | Status: DC
  Administered 2020-03-26 – 2020-03-29 (×4): 0.4 mg via ORAL
  Filled 2020-03-26 (×4): qty 1

## 2020-03-26 MED ORDER — CHLORHEXIDINE GLUCONATE CLOTH 2 % EX PADS
6.0000 | MEDICATED_PAD | Freq: Every day | CUTANEOUS | Status: DC
  Administered 2020-03-26 – 2020-03-28 (×3): 6 via TOPICAL

## 2020-03-26 NOTE — Progress Notes (Addendum)
PROGRESS NOTE                                                                                                                                                                                                             Patient Demographics:    Emma Maldonado, is a 70 y.o. female, DOB - 06/06/1950, ZOX:[id number]  Outpatient Primary MD for the patient is Patient, No Pcp Per    LOS - 1  Admit date - 03/24/2020    Chief Complaint  Patient presents with  . Fall  . Hyperglycemia       Brief Narrative (HPI from H&P)  Kerrilyn Tillman AbideCarmen Maduro is a 70 y.o. female with medical history significant for DM, HTN, renal and dementia [facility], apparently she has been getting gradually more confused and [facility].  In the ER head CT was negative, her exam was nonfocal, she had evidence of UTI induced toxic encephalopathy on top of underlying dementia and admitted to the hospital.   Subjective:    Patient in bed, appears comfortable, denies any headache, no fever, no chest pain or pressure, no shortness of breath , no abdominal pain. No focal weakness.    Assessment  & Plan :     1. Toxic encephalopathy in a patient with underlying dementia due to UTI. She has a nonfocal exam, no headache continue to the line supportive care with antibiotics and hydration, PT/OT.  2. UTI - on empiric Rocephin follow C&S.  3. Dementia. She is on memantine & sertraline currently will monitor.  4.  Urinary retention - Foley+ Flomax.  5.  Dyslipidemia - continue statin.  6.  Hypokalemia - replaced.  7.  Hypertension.  Currently on Norvasc.  8. DM2 - on Lantus and sliding scale dose adjusted, will continue to monitor.  Lab Results  Component Value Date   HGBA1C 9.9 (H) 03/25/2020   CBG (last 3)  Recent Labs    03/25/20 1621 03/25/20 2202 03/26/20 0626  GLUCAP 199* 248*  217*         Condition - Extremely Guarded  Family Communication  :   Called the listed phone number for daughter Gardiner RamusLillian [phone] on 03/25/2020 at 10:25 AM.  No response.  Updated her on 03/26/2020  over the phone.  Husband Renae Fickle on [phone] on 03/25/20   Code Status :  Full  Consults  :  None  PUD Prophylaxis :    Procedures  :     CT head.  Nonacute.      Disposition Plan  :    Status is: Inpatient  Remains inpatient appropriate because:IV treatments appropriate due to intensity of illness or inability to take PO   Dispo: The patient is from: Home              Anticipated d/c is to: Home              Patient currently is not medically stable to d/c.   Difficult to place patient No  DVT Prophylaxis  :  Lovenox    Lab Results  Component Value Date   PLT 132 (L) 03/26/2020    Diet :  Diet Order            Diet heart healthy/carb modified Room service appropriate? Yes; Fluid consistency: Thin  Diet effective now                  Inpatient Medications  Scheduled Meds: . amLODipine  10 mg Oral Daily  . enoxaparin (LOVENOX) injection  40 mg Subcutaneous Q24H  . insulin aspart  0-15 Units Subcutaneous TID WC  . insulin aspart  0-5 Units Subcutaneous QHS  . insulin glargine  30 Units Subcutaneous BID  . memantine  10 mg Oral BID  . rosuvastatin  5 mg Oral Daily  . sertraline  125 mg Oral QPM  . tamsulosin  0.4 mg Oral Daily   Continuous Infusions: . cefTRIAXone (ROCEPHIN)  IV 1 g (03/25/20 2205)  . lactated ringers with kcl 100 mL/hr at 03/25/20 2238   PRN Meds:.acetaminophen **OR** acetaminophen, hydrALAZINE, [DISCONTINUED] ondansetron **OR** ondansetron (ZOFRAN) IV, senna-docusate  Antibiotics  :    Anti-infectives (From admission, onward)   Start     Dose/Rate Route Frequency Ordered Stop   03/25/20 2200  cefTRIAXone (ROCEPHIN) 1 g in sodium chloride 0.9 % 100 mL IVPB        1 g 200 mL/hr over 30 Minutes Intravenous Every 24 hours  03/25/20 0621     03/25/20 0615  cefTRIAXone (ROCEPHIN) 1 g in sodium chloride 0.9 % 100 mL IVPB  Status:  Discontinued        1 g 200 mL/hr over 30 Minutes Intravenous Every 24 hours 03/25/20 0604 03/25/20 0620   03/25/20 0400  cefTRIAXone (ROCEPHIN) 1 g in sodium chloride 0.9 % 100 mL IVPB        1 g 200 mL/hr over 30 Minutes Intravenous  Once 03/25/20 0352 03/25/20 0603       Time Spent in minutes  30   Susa Raring M.D on 03/26/2020 at 8:40 AM  To page go to [email or web address]   [facility] -  Office  [phone]    See all Orders from today for further details    Objective:   Vitals:   03/25/20 1602 03/25/20 2159 03/26/20 0527 03/26/20 0823  BP: 98/66 (!) 136/57 (!) 150/66 (!) 148/58  Pulse: 85 73 77 78  Resp: 17 16 16 18   Temp: 98.5 F (36.9 C) 98.3 F (36.8 C) 98.2 F (36.8 C) 98.4 F (36.9 C)  TempSrc:  Oral Oral Oral  SpO2: 93% 97% 97% 98%  Weight:   69.1 kg   Height:        Wt Readings from Last  3 Encounters:  03/26/20 69.1 kg  03/12/20 64 kg  03/27/12 63.8 kg     Intake/Output Summary (Last 24 hours) at 03/26/2020 0840 Last data filed at 03/26/2020 0824 Gross per 24 hour  Intake 1774.04 ml  Output 702 ml  Net 1072.04 ml     Physical Exam  Awake but pleasantly confused, No new F.N deficits, Normal affect Ada.AT,PERRAL Supple Neck,No JVD, No cervical lymphadenopathy appriciated.  Symmetrical Chest wall movement, Good air movement bilaterally, CTAB RRR,No Gallops, Rubs or new Murmurs, No Parasternal Heave +ve B.Sounds, Abd Soft, mild suprapubic fullness, No organomegaly appriciated, No rebound - guarding or rigidity. No Cyanosis, Clubbing or edema, No new Rash or bruise      Data Review:    CBC Recent Labs  Lab 03/24/20 2330 03/25/20 0730 03/26/20 0113  WBC 9.9 9.2 9.1  HGB 13.4 13.6 12.7  HCT 40.4 42.5 39.5  PLT 175 164 132*  MCV 79.7* 82.0 82.0  MCH 26.4 26.3 26.3  MCHC 33.2 32.0 32.2  RDW 14.7 14.7 14.9  LYMPHSABS 0.9   --   --   MONOABS 0.5  --   --   EOSABS 0.0  --   --   BASOSABS 0.0  --   --     Recent Labs  Lab 03/24/20 2330 03/25/20 0724 03/25/20 0730 03/26/20 0113  NA 136  --   --  137  K 3.3*  --   --  3.8  CL 103  --   --  106  CO2 25  --   --  23  GLUCOSE 329*  --   --  254*  BUN 9  --   --  8  CREATININE 0.78  --  0.89 0.64  CALCIUM 8.8*  --   --  8.2*  AST 16  --   --   --   ALT 15  --   --   --   ALKPHOS 76  --   --   --   BILITOT 1.4*  --   --   --   ALBUMIN 3.4*  --   --   --   MG  --   --  1.5* 1.9  PROCALCITON  --   --  0.26 1.46  LATICACIDVEN 1.7  --   --   --   INR 1.1  --   --   --   HGBA1C  --   --  9.9*  --   BNP  --  417.6*  --  590.7*    ------------------------------------------------------------------------------------------------------------------ No results for input(s): CHOL, HDL, LDLCALC, TRIG, CHOLHDL, LDLDIRECT in the last 72 hours.  Lab Results  Component Value Date   HGBA1C 9.9 (H) 03/25/2020   ------------------------------------------------------------------------------------------------------------------ No results for input(s): TSH, T4TOTAL, T3FREE, THYROIDAB in the last 72 hours.  Invalid input(s): FREET3  Cardiac Enzymes No results for input(s): CKMB, TROPONINI, MYOGLOBIN in the last 168 hours.  Invalid input(s): CK ------------------------------------------------------------------------------------------------------------------    Component Value Date/Time   BNP 590.7 (H) 03/26/2020 0113    Micro Results Recent Results (from the past 240 hour(s))  SARS CORONAVIRUS 2 (TAT 6-24 HRS) Nasopharyngeal Nasopharyngeal Swab     Status: None   Collection Time: 03/24/20 11:40 PM   Specimen: Nasopharyngeal Swab  Result Value Ref Range Status   SARS Coronavirus 2 NEGATIVE NEGATIVE Final    Comment: (NOTE) SARS-CoV-2 target nucleic acids are NOT DETECTED.  The SARS-CoV-2 RNA is generally detectable in upper and lower respiratory specimens  during the acute phase of infection. Negative results do not preclude SARS-CoV-2 infection, do not rule out co-infections with other pathogens, and should not be used as the sole basis for treatment or other patient management decisions. Negative results must be combined with clinical observations, patient history, and epidemiological information. The expected result is Negative.  Fact Sheet for Patients: HairSlick.no  Fact Sheet for Healthcare Providers: quierodirigir.com  This test is not yet approved or cleared by the [place] FDA and  has been authorized for detection and/or diagnosis of SARS-CoV-2 by FDA under an Emergency Use Authorization (EUA). This EUA will remain  in effect (meaning this test can be used) for the duration of the COVID-19 declaration under Se ction 564(b)(1) of the Act, 21 U.S.C. section 360bbb-3(b)(1), unless the authorization is terminated or revoked sooner.  Performed at [facility], [address], [place], [place] [address]   Urine culture     Status: Abnormal (Preliminary result)   Collection Time: 03/25/20  3:16 AM   Specimen: Urine, Clean Catch  Result Value Ref Range Status   Specimen Description URINE, CLEAN CATCH  Final   Special Requests NONE  Final   Culture (A)  Final    >=100,000 COLONIES/mL GRAM NEGATIVE RODS SUSCEPTIBILITIES TO FOLLOW Performed at [facility], [address] [place], [place] [address]    Report Status PENDING  Incomplete    Radiology Reports DG Chest 2 View  Result Date: 03/12/2020 CLINICAL DATA:  Chest pain EXAM: CHEST - 2 VIEW COMPARISON:  05/06/2019 FINDINGS: There are streaky airspace opacities at the lung bases favored to represent areas of atelectasis. There is no large pleural effusion or pneumothorax. The heart size is unremarkable. Aortic calcifications are noted. IMPRESSION: No active cardiopulmonary disease. Electronically Signed    By: Katherine Mantle M.D.   On: 03/12/2020 19:56   CT Head Wo Contrast  Result Date: 03/24/2020 CLINICAL DATA:  Facial trauma, dementia EXAM: CT HEAD WITHOUT CONTRAST TECHNIQUE: Contiguous axial images were obtained from the base of the skull through the vertex without intravenous contrast. COMPARISON:  None. FINDINGS: Brain: No acute infarct or hemorrhage. Lateral ventricles and midline structures are unremarkable. No acute extra-axial fluid collections. No mass effect. Vascular: Moderate atherosclerosis of the internal carotid arteries. No hyperdense vessel. Skull: Normal. Negative for fracture or focal lesion. Sinuses/Orbits: No acute finding. Other: None. IMPRESSION: 1. No acute intracranial process. Electronically Signed   By: Sharlet Salina M.D.   On: 03/24/2020 23:08   DG Chest Port 1 View  Result Date: 03/24/2020 CLINICAL DATA:  Larey Seat, hyperglycemia, sepsis EXAM: PORTABLE CHEST 1 VIEW COMPARISON:  03/12/2020 FINDINGS: The heart size and mediastinal contours are within normal limits. Both lungs are clear. The visualized skeletal structures are unremarkable. IMPRESSION: No active disease. Electronically Signed   By: Sharlet Salina M.D.   On: 03/24/2020 22:52

## 2020-03-26 NOTE — Progress Notes (Signed)
Occupational Therapy Evaluation Patient Details Name: Emma Maldonado MRN: [id number] DOB: 12/18/1950 Today's Date: 03/26/2020    History of Present Illness Pt is a 70 y/o female admitted 3/13 secondary to fall and AMS. Found to have encephalopathy secondary to UTI. PMH includes dementia, HTN, and DM.   Clinical Impression   Pt from [facility] care unit. Spoke with daughter over the phone who states her Mom was ambulating without a RW and completing her bathing/dressing with S. States since her Mom has been at [facility] for the last 1 1/2 months, she has seen a significant decline. Pt currently requires Min A for mobility and ADL (total A for pericare due to being incontinent of BM) @ RW level and is a significant fall risk. Recommend rehab at SNF given recurrent falls and decline in PLOF. Wll follow acutely.     Follow Up Recommendations  SNF;Supervision/Assistance - 24 hour    Equipment Recommendations  None recommended by OT    Recommendations for Other Services       Precautions / Restrictions Precautions Precautions: Fall Restrictions Weight Bearing Restrictions: No      Mobility Bed Mobility Overal bed mobility: Needs Assistance Bed Mobility: Supine to Sit;Sit to Supine     Supine to sit: Supervision Sit to supine: Supervision   General bed mobility comments: Supervision for safety.    Transfers Overall transfer level: Needs assistance Equipment used: None Transfers: Sit to/from Stand Sit to Stand: Min assist              Balance Overall balance assessment: Needs assistance Sitting-balance support: No upper extremity supported;Feet supported Sitting balance-Leahy Scale: Fair     Standing balance support: Bilateral upper extremity supported;During functional activity Standing balance-Leahy Scale: Poor Standing balance comment: Reliant on BUE and external support                           ADL either performed or assessed  with clinical judgement   ADL Overall ADL's : Needs assistance/impaired Eating/Feeding: Set up   Grooming: Set up;Sitting   Upper Body Bathing: Set up;Supervision/ safety;Sitting   Lower Body Bathing: Minimal assistance;Sit to/from stand   Upper Body Dressing : Minimal assistance;Sitting   Lower Body Dressing: Minimal assistance;Sit to/from stand   Toilet Transfer: Minimal assistance;RW;BSC   Toileting- Architect and Hygiene: Total assistance Toileting - Clothing Manipulation Details (indicate cue type and reason): incontinenet of BM     Functional mobility during ADLs: Minimal assistance;Rolling walker;Cueing for safety;Cueing for sequencing       Vision         Perception     Praxis      Pertinent Vitals/Pain Pain Assessment: Faces Faces Pain Scale: Hurts a little bit Pain Location: BLE Pain Descriptors / Indicators: Grimacing Pain Intervention(s): Limited activity within patient's tolerance     Hand Dominance Right   Extremity/Trunk Assessment Upper Extremity Assessment Upper Extremity Assessment: Generalized weakness   Lower Extremity Assessment Lower Extremity Assessment: Defer to PT evaluation   Cervical / Trunk Assessment Cervical / Trunk Assessment: Kyphotic (posterior bias)   Communication Communication Communication: HOH   Cognition Arousal/Alertness: Awake/alert Behavior During Therapy: WFL for tasks assessed/performed Overall Cognitive Status: Impaired/Different from baseline Area of Impairment: Orientation;Attention;Memory;Following commands;Safety/judgement;Awareness;Problem solving                 Orientation Level: Disoriented to;Place;Time;Situation Current Attention Level: Sustained Memory: Decreased recall of precautions;Decreased short-term memory Following Commands: Follows one  step commands with increased time Safety/Judgement: Decreased awareness of safety;Decreased awareness of deficits Awareness:  Intellectual Problem Solving: Slow processing;Decreased initiation;Difficulty sequencing;Requires verbal cues;Requires tactile cues General Comments: Daughter states her mother is not at her baseline as of 3/14. Explained she may be more confused because of her medical condition/UTI in addition to being in an unfamiliar environment. Having family around consistently will help decrease confusion.    General Comments       Exercises     Shoulder Instructions      Home Living Family/patient expects to be discharged to:: Skilled nursing facility                                 Additional Comments: Per notes, from [facility] (not SNF)      Prior Functioning/Environment Level of Independence: Needs assistance  Gait / Transfers Assistance Needed: walked without AD ADL's / Homemaking Assistance Needed: was doing her own bathing/dressing prior to going to [facility] Communication / Swallowing Assistance Needed: speaks spanish/engish          OT Problem List: Decreased strength;Decreased activity tolerance;Impaired balance (sitting and/or standing);Decreased cognition;Decreased safety awareness;Decreased knowledge of use of DME or AE;Obesity;Pain      OT Treatment/Interventions: Self-care/ADL training;Therapeutic exercise;Neuromuscular education;DME and/or AE instruction;Therapeutic activities;Cognitive remediation/compensation;Patient/family education;Balance training    OT Goals(Current goals can be found in the care plan section) Acute Rehab OT Goals Patient Stated Goal: for legs to stop hurting OT Goal Formulation: With family Time For Goal Achievement: 04/09/20 Potential to Achieve Goals: Good  OT Frequency: Min 2X/week   Barriers to D/C:            Co-evaluation              AM-PAC OT "6 Clicks" Daily Activity     Outcome Measure Help from another person eating meals?: None Help from another person taking care of personal grooming?: A  Little Help from another person toileting, which includes using toliet, bedpan, or urinal?: A Little Help from another person bathing (including washing, rinsing, drying)?: A Little Help from another person to put on and taking off regular upper body clothing?: A Little Help from another person to put on and taking off regular lower body clothing?: A Little 6 Click Score: 19   End of Session Equipment Utilized During Treatment: Rolling walker Nurse Communication: Mobility status  Activity Tolerance: Patient tolerated treatment well Patient left: in bed;with call bell/phone within reach;with bed alarm set  OT Visit Diagnosis: Unsteadiness on feet (R26.81);Other abnormalities of gait and mobility (R26.89);Repeated falls (R29.6);Muscle weakness (generalized) (M62.81);Other symptoms and signs involving cognitive function;Pain Pain - part of body:  (generalized)                Time: 9379-0240 OT Time Calculation (min): 14 min Charges:  OT General Charges $OT Visit: 1 Visit OT Evaluation $OT Eval Low Complexity: 1 Low  Versia Mignogna, OT/L   Acute OT Clinical Specialist Acute Rehabilitation Services Pager [phone] Office [phone]   [facility] 03/26/2020, 11:55 AM

## 2020-03-27 LAB — MAGNESIUM: Magnesium: 1.7 mg/dL (ref 1.7–2.4)

## 2020-03-27 LAB — URINE CULTURE: Culture: >=100000 — AB

## 2020-03-27 LAB — GLUCOSE, CAPILLARY
Glucose-Capillary: 90 mg/dL (ref 70–99)
Glucose-Capillary: 220 mg/dL — ABNORMAL HIGH (ref 70–99)
Glucose-Capillary: 129 mg/dL — ABNORMAL HIGH (ref 70–99)
Glucose-Capillary: 111 mg/dL — ABNORMAL HIGH (ref 70–99)

## 2020-03-27 LAB — BRAIN NATRIURETIC PEPTIDE: B Natriuretic Peptide: 174.8 pg/mL — ABNORMAL HIGH (ref 0.0–100.0)

## 2020-03-27 LAB — PROCALCITONIN: Procalcitonin: 4.58 ng/mL

## 2020-03-27 MED ORDER — SODIUM CHLORIDE 0.9 % IV SOLN
2.0000 g | INTRAVENOUS | Status: DC
  Administered 2020-03-27 – 2020-03-28 (×2): 2 g via INTRAVENOUS
  Filled 2020-03-27 (×2): qty 20

## 2020-03-27 NOTE — Progress Notes (Signed)
PROGRESS NOTE                                                                                                                                                                                                             Patient Demographics:    Emma Maldonado, is a 70 y.o. female, DOB - 1950/07/06, AOZ:[id number]  Outpatient Primary MD for the patient is Patient, No Pcp Per    LOS - 2  Admit date - 03/24/2020    Chief Complaint  Patient presents with  . Fall  . Hyperglycemia       Brief Narrative (HPI from H&P)  Emma Maldonado is a 70 y.o. female with medical history significant for DM, HTN, renal and dementia presents by EMS from [facility] for a fall, apparently she has been getting gradually more confused and has had a few falls at [facility] for the last few days.  In the ER head CT was negative, her exam was nonfocal, she had evidence of UTI induced toxic encephalopathy on top of underlying dementia and admitted to the hospital.   Subjective:   Patient in bed, appears comfortable, denies any headache, no fever, no chest pain or pressure, no shortness of breath , no abdominal pain. No focal weakness.   Assessment  & Plan :     1. Toxic encephalopathy in a patient with underlying dementia due to UTI. She has a nonfocal exam, no headache continue Rx with antibiotics and hydration, PT/OT. Some baseline chronic tremors - outpt Neuro follow up post DC. Likely back to SNF 03/28/20.  2. UTI - urine culture growing E. coli, procalcitonin is quite elevated and there is a chance of bacteremia, increase Rocephin to 2 gms for now.  3. Dementia. She is on memantine & sertraline currently will monitor.  4.  Urinary retention - Foley+ Flomax.  5.  Dyslipidemia - continue statin.  6.  Hypokalemia - replaced.  7.  Hypertension.  Currently on Norvasc.  8. DM2 - on Lantus and sliding scale dose adjusted, will continue to  monitor.  Lab Results  Component Value Date   HGBA1C 9.9 (H) 03/25/2020   CBG (last 3)  Recent Labs    03/26/20 1620 03/26/20 2057 03/27/20 0641  GLUCAP 87 138* 90         Condition - Extremely Guarded  Family Communication  :   Called the listed phone number for daughter Gardiner Ramus [phone] on 03/25/2020 at 10:25 AM.  No response.  Updated her on 03/26/2020 over the phone, 03/27/20  Joella Prince on [phone] on 03/25/20   Code Status :  Full  Consults  :  None  PUD Prophylaxis :    Procedures  :     CT head.  Nonacute.      Disposition Plan  :    Status is: Inpatient  Remains inpatient appropriate because:IV treatments appropriate due to intensity of illness or inability to take PO   Dispo: The patient is from: Home              Anticipated d/c is to: Home              Patient currently is not medically stable to d/c.   Difficult to place patient No  DVT Prophylaxis  :  Lovenox    Lab Results  Component Value Date   PLT 132 (L) 03/26/2020    Diet :  Diet Order            Diet heart healthy/carb modified Room service appropriate? Yes; Fluid consistency: Thin  Diet effective now                  Inpatient Medications  Scheduled Meds: . amLODipine  10 mg Oral Daily  . Chlorhexidine Gluconate Cloth  6 each Topical Daily  . enoxaparin (LOVENOX) injection  40 mg Subcutaneous Q24H  . insulin aspart  0-15 Units Subcutaneous TID WC  . insulin aspart  0-5 Units Subcutaneous QHS  . insulin glargine  30 Units Subcutaneous BID  . memantine  10 mg Oral BID  . rosuvastatin  5 mg Oral Daily  . sertraline  125 mg Oral QPM  . tamsulosin  0.4 mg Oral Daily   Continuous Infusions: . cefTRIAXone (ROCEPHIN)  IV     PRN Meds:.acetaminophen **OR** acetaminophen, hydrALAZINE, [DISCONTINUED] ondansetron **OR** ondansetron (ZOFRAN) IV, senna-docusate  Antibiotics  :    Anti-infectives (From admission, onward)   Start     Dose/Rate Route Frequency  Ordered Stop   03/27/20 2200  cefTRIAXone (ROCEPHIN) 2 g in sodium chloride 0.9 % 100 mL IVPB        2 g 200 mL/hr over 30 Minutes Intravenous Every 24 hours 03/27/20 1055     03/25/20 2200  cefTRIAXone (ROCEPHIN) 1 g in sodium chloride 0.9 % 100 mL IVPB  Status:  Discontinued        1 g 200 mL/hr over 30 Minutes Intravenous Every 24 hours 03/25/20 0621 03/27/20 1055   03/25/20 0615  cefTRIAXone (ROCEPHIN) 1 g in sodium chloride 0.9 % 100 mL IVPB  Status:  Discontinued        1 g 200 mL/hr over 30 Minutes Intravenous Every 24 hours 03/25/20 0604 03/25/20 0620   03/25/20 0400  cefTRIAXone (ROCEPHIN) 1 g in sodium chloride 0.9 % 100 mL IVPB        1 g 200 mL/hr over 30 Minutes Intravenous  Once 03/25/20 0352 03/25/20 0603       Time Spent in minutes  30   Susa Raring M.D on 03/27/2020 at 10:59 AM  To page go to [email or web address]   [facility] -  Office  [phone]    See all Orders from today for further details    Objective:   Vitals:   03/26/20 1646 03/26/20 2056 03/27/20 0401 03/27/20 0930  BP: Marland Kitchen)  140/51 (!) 169/78 (!) 142/64 (!) 156/70  Pulse: 75 81 79 81  Resp: 18 18 16 18   Temp: 98.7 F (37.1 C) 99.5 F (37.5 C) 99.2 F (37.3 C) 98.1 F (36.7 C)  TempSrc:   Oral Oral  SpO2: 96% 96% 98% 96%  Weight:  69.1 kg    Height:        Wt Readings from Last 3 Encounters:  03/26/20 69.1 kg  03/12/20 64 kg  03/27/12 63.8 kg     Intake/Output Summary (Last 24 hours) at 03/27/2020 1059 Last data filed at 03/27/2020 0934 Gross per 24 hour  Intake 680 ml  Output 2425 ml  Net -1745 ml     Physical Exam  Awake, mildly confused, No new F.N deficits,   [place].AT,PERRAL Supple Neck,No JVD, No cervical lymphadenopathy appriciated.  Symmetrical Chest wall movement, Good air movement bilaterally, CTAB RRR,No Gallops, Rubs or new Murmurs, No Parasternal Heave +ve B.Sounds, Abd Soft, No tenderness, No organomegaly appriciated, No rebound - guarding or rigidity. No  Cyanosis, Clubbing or edema, No new Rash or bruise    Data Review:    CBC Recent Labs  Lab 03/24/20 2330 03/25/20 0730 03/26/20 0113  WBC 9.9 9.2 9.1  HGB 13.4 13.6 12.7  HCT 40.4 42.5 39.5  PLT 175 164 132*  MCV 79.7* 82.0 82.0  MCH 26.4 26.3 26.3  MCHC 33.2 32.0 32.2  RDW 14.7 14.7 14.9  LYMPHSABS 0.9  --   --   MONOABS 0.5  --   --   EOSABS 0.0  --   --   BASOSABS 0.0  --   --     Recent Labs  Lab 03/24/20 2330 03/25/20 0724 03/25/20 0730 03/26/20 0113 03/27/20 0149  NA 136  --   --  137  --   K 3.3*  --   --  3.8  --   CL 103  --   --  106  --   CO2 25  --   --  23  --   GLUCOSE 329*  --   --  254*  --   BUN 9  --   --  8  --   CREATININE 0.78  --  0.89 0.64  --   CALCIUM 8.8*  --   --  8.2*  --   AST 16  --   --   --   --   ALT 15  --   --   --   --   ALKPHOS 76  --   --   --   --   BILITOT 1.4*  --   --   --   --   ALBUMIN 3.4*  --   --   --   --   MG  --   --  1.5* 1.9 1.7  PROCALCITON  --   --  0.26 1.46 4.58  LATICACIDVEN 1.7  --   --   --   --   INR 1.1  --   --   --   --   HGBA1C  --   --  9.9*  --   --   BNP  --  417.6*  --  590.7* 174.8*    ------------------------------------------------------------------------------------------------------------------ No results for input(s): CHOL, HDL, LDLCALC, TRIG, CHOLHDL, LDLDIRECT in the last 72 hours.  Lab Results  Component Value Date   HGBA1C 9.9 (H) 03/25/2020   ------------------------------------------------------------------------------------------------------------------ No results for input(s): TSH, T4TOTAL, T3FREE, THYROIDAB in the last 72  hours.  Invalid input(s): FREET3  Cardiac Enzymes No results for input(s): CKMB, TROPONINI, MYOGLOBIN in the last 168 hours.  Invalid input(s): CK ------------------------------------------------------------------------------------------------------------------    Component Value Date/Time   BNP 174.8 (H) 03/27/2020 0149    Micro  Results Recent Results (from the past 240 hour(s))  Blood culture (routine single)     Status: None (Preliminary result)   Collection Time: 03/24/20 11:34 PM   Specimen: BLOOD  Result Value Ref Range Status   Specimen Description BLOOD RIGHT ARM  Final   Special Requests   Final    BOTTLES DRAWN AEROBIC AND ANAEROBIC Blood Culture adequate volume   Culture   Final    NO GROWTH 2 DAYS Performed at [facility], [address] [place], [place] [address]    Report Status PENDING  Incomplete  SARS CORONAVIRUS 2 (TAT 6-24 HRS) Nasopharyngeal Nasopharyngeal Swab     Status: None   Collection Time: 03/24/20 11:40 PM   Specimen: Nasopharyngeal Swab  Result Value Ref Range Status   SARS Coronavirus 2 NEGATIVE NEGATIVE Final    Comment: (NOTE) SARS-CoV-2 target nucleic acids are NOT DETECTED.  The SARS-CoV-2 RNA is generally detectable in upper and lower respiratory specimens during the acute phase of infection. Negative results do not preclude SARS-CoV-2 infection, do not rule out co-infections with other pathogens, and should not be used as the sole basis for treatment or other patient management decisions. Negative results must be combined with clinical observations, patient history, and epidemiological information. The expected result is Negative.  Fact Sheet for Patients: HairSlick.nohttps://www.fda.gov/media/138098/download  Fact Sheet for Healthcare Providers: quierodirigir.comhttps://www.fda.gov/media/138095/download  This test is not yet approved or cleared by the [place] FDA and  has been authorized for detection and/or diagnosis of SARS-CoV-2 by FDA under an Emergency Use Authorization (EUA). This EUA will remain  in effect (meaning this test can be used) for the duration of the COVID-19 declaration under Se ction 564(b)(1) of the Act, 21 U.S.C. section 360bbb-3(b)(1), unless the authorization is terminated or revoked sooner.  Performed at [facility], [address]  [place], [place] [address]   Urine culture     Status: Abnormal   Collection Time: 03/25/20  3:16 AM   Specimen: Urine, Clean Catch  Result Value Ref Range Status   Specimen Description URINE, CLEAN CATCH  Final   Special Requests   Final    NONE Performed at [facility], [address] [place], [place] [address]    Culture >=100,000 COLONIES/mL ESCHERICHIA COLI (A)  Final   Report Status 03/27/2020 FINAL  Final   Organism ID, Bacteria ESCHERICHIA COLI (A)  Final      Susceptibility   Escherichia coli - MIC*    AMPICILLIN 4 SENSITIVE Sensitive     CEFAZOLIN <=4 SENSITIVE Sensitive     CEFEPIME <=0.12 SENSITIVE Sensitive     CEFTRIAXONE <=0.25 SENSITIVE Sensitive     CIPROFLOXACIN <=0.25 SENSITIVE Sensitive     GENTAMICIN <=1 SENSITIVE Sensitive     IMIPENEM <=0.25 SENSITIVE Sensitive     NITROFURANTOIN <=16 SENSITIVE Sensitive     TRIMETH/SULFA <=20 SENSITIVE Sensitive     AMPICILLIN/SULBACTAM <=2 SENSITIVE Sensitive     PIP/TAZO <=4 SENSITIVE Sensitive     * >=100,000 COLONIES/mL ESCHERICHIA COLI    Radiology Reports DG Chest 2 View  Result Date: 03/12/2020 CLINICAL DATA:  Chest pain EXAM: CHEST - 2 VIEW COMPARISON:  05/06/2019 FINDINGS: There are streaky airspace opacities at the lung  bases favored to represent areas of atelectasis. There is no large pleural effusion or pneumothorax. The heart size is unremarkable. Aortic calcifications are noted. IMPRESSION: No active cardiopulmonary disease. Electronically Signed   By: Katherine Mantle M.D.   On: 03/12/2020 19:56   CT Head Wo Contrast  Result Date: 03/24/2020 CLINICAL DATA:  Facial trauma, dementia EXAM: CT HEAD WITHOUT CONTRAST TECHNIQUE: Contiguous axial images were obtained from the base of the skull through the vertex without intravenous contrast. COMPARISON:  None. FINDINGS: Brain: No acute infarct or hemorrhage. Lateral ventricles and midline structures are unremarkable. No acute extra-axial fluid collections. No  mass effect. Vascular: Moderate atherosclerosis of the internal carotid arteries. No hyperdense vessel. Skull: Normal. Negative for fracture or focal lesion. Sinuses/Orbits: No acute finding. Other: None. IMPRESSION: 1. No acute intracranial process. Electronically Signed   By: Sharlet Salina M.D.   On: 03/24/2020 23:08   DG Chest Port 1 View  Result Date: 03/24/2020 CLINICAL DATA:  Larey Seat, hyperglycemia, sepsis EXAM: PORTABLE CHEST 1 VIEW COMPARISON:  03/12/2020 FINDINGS: The heart size and mediastinal contours are within normal limits. Both lungs are clear. The visualized skeletal structures are unremarkable. IMPRESSION: No active disease. Electronically Signed   By: Sharlet Salina M.D.   On: 03/24/2020 22:52

## 2020-03-27 NOTE — [place] FL2 (Signed)
[place] MEDICAID FL2 LEVEL OF CARE SCREENING TOOL     IDENTIFICATION  Patient Name: Emma Maldonado Birthdate: 02/11/1950 Sex: female Admission Date (Current Location): 03/24/2020  [facility] and [place] Number:  Producer, television/film/video and Address:  [facility] [place], [address] [place], [place] [address]      Provider Number: [id number]  Attending Physician Name and Address:  Leroy Sea, MD  Relative Name and Phone Number:       Current Level of Care: Hospital Recommended Level of Care: Skilled Nursing Facility (For Short-Term rehab) Prior Approval Number:    Date Approved/Denied:   PASRR Number: [id number] (Eff. 03/27/20)  Discharge Plan: SNF    Current Diagnoses: Patient Active Problem List   Diagnosis Date Noted  . UTI (urinary tract infection) 03/25/2020  . Diabetes mellitus type 2, uncomplicated (HCC) 03/25/2020  . Hypokalemia 03/25/2020  . Essential hypertension 03/25/2020  . Dementia (HCC) 03/25/2020  . Altered mental status 03/25/2020  . Uncontrolled type 2 diabetes mellitus with hyperglycemia (HCC) 03/25/2020  . Accelerated hypertension 03/28/2012  . Viral gastroenteritis 03/28/2012  . Sepsis (HCC) 03/26/2012  . History of renal transplant 03/26/2012  . Diabetes mellitus (HCC) 03/26/2012  . Hyperlipidemia 03/26/2012    Orientation RESPIRATION BLADDER Height & Weight     Self,Place  Normal Continent,Indwelling catheter (Urethral catheter placed 3/15) Weight: 152 lb 5.6 oz (69.1 kg) Height:  5\' 2"  (157.5 cm)  BEHAVIORAL SYMPTOMS/MOOD NEUROLOGICAL BOWEL NUTRITION STATUS      Incontinent Diet (Heart-healthy carb modified)  AMBULATORY STATUS COMMUNICATION OF NEEDS Skin   Extensive Assist (Mod assist per PT) Verbally (Speaks Spanish) Other (Comment) (Excoriated-scratch marks left leg)                       Personal Care Assistance Level of Assistance  Bathing,Feeding,Dressing Bathing Assistance: Limited assistance  (Upper body assistancd with set-up; Lower body Min assist) Feeding assistance: Limited assistance (Assistance with set-up) Dressing Assistance: Limited assistance (Min assist per OT)     Functional Limitations Info  Sight,Hearing,Speech Sight Info: Impaired (Wears glasses) Hearing Info: Impaired Speech Info: Adequate    SPECIAL CARE FACTORS FREQUENCY  PT (By licensed PT),OT (By licensed OT)     PT Frequency: Evaluated 3/14. PT at [facility] Eval and Treat, a minimum of 5 days per week OT Frequency: Evaluated 3/15. OT at SNF Eval and Treat, a minimum of 5 days per week            Contractures Contractures Info: Not present    Additional Factors Info  Code Status,Allergies,Insulin Sliding Scale Code Status Info: Full Allergies Info: No known allergies   Insulin Sliding Scale Info: 0-5 Units daily at bedtime; 0-15 Units 3 times per day with meals       Current Medications (03/27/2020):  This is the current hospital active medication list Current Facility-Administered Medications  Medication Dose Route Frequency Provider Last Rate Last Admin  . acetaminophen (TYLENOL) tablet 650 mg  650 mg Oral Q6H PRN Chotiner, 03/29/2020, MD   650 mg at 03/25/20 1914   Or  . acetaminophen (TYLENOL) suppository 650 mg  650 mg Rectal Q6H PRN Chotiner, 03/27/20, MD      . amLODipine (NORVASC) tablet 10 mg  10 mg Oral Daily Claudean Severance, MD   10 mg at 03/27/20 0940  . cefTRIAXone (ROCEPHIN) 2 g in sodium chloride 0.9 % 100 mL IVPB  2 g Intravenous Q24H Singh, Prashant K,  MD      . Chlorhexidine Gluconate Cloth 2 % PADS 6 each  6 each Topical Daily Leroy Sea, MD   6 each at 03/27/20 [phone]  . enoxaparin (LOVENOX) injection 40 mg  40 mg Subcutaneous Q24H Chotiner, Claudean Severance, MD   40 mg at 03/27/20 0943  . hydrALAZINE (APRESOLINE) injection 10 mg  10 mg Intravenous Q6H PRN Leroy Sea, MD   10 mg at 03/25/20 0824  . insulin aspart (novoLOG) injection 0-15 Units  0-15 Units Subcutaneous TID  WC Chotiner, Claudean Severance, MD   5 Units at 03/27/20 1220  . insulin aspart (novoLOG) injection 0-5 Units  0-5 Units Subcutaneous QHS Chotiner, Claudean Severance, MD   2 Units at 03/25/20 2211  . insulin glargine (LANTUS) injection 30 Units  30 Units Subcutaneous BID Leroy Sea, MD   30 Units at 03/27/20 0940  . memantine (NAMENDA) tablet 10 mg  10 mg Oral BID Chotiner, Claudean Severance, MD   10 mg at 03/27/20 0940  . ondansetron (ZOFRAN) injection 4 mg  4 mg Intravenous Q6H PRN Chotiner, Claudean Severance, MD      . rosuvastatin (CRESTOR) tablet 5 mg  5 mg Oral Daily Chotiner, Claudean Severance, MD   5 mg at 03/27/20 0940  . senna-docusate (Senokot-S) tablet 1 tablet  1 tablet Oral QHS PRN Chotiner, Claudean Severance, MD      . sertraline (ZOLOFT) tablet 125 mg  125 mg Oral QPM Chotiner, Claudean Severance, MD   125 mg at 03/26/20 1814  . tamsulosin (FLOMAX) capsule 0.4 mg  0.4 mg Oral Daily Leroy Sea, MD   0.4 mg at 03/27/20 0940     Discharge Medications: Please see discharge summary for a list of discharge medications.  Relevant Imaging Results:  Relevant Lab Results:   Additional Information ss#[id number]. Moderna COVID vaccinations: 02/20/19, 03/19/19, 09/20/19  Cristobal Goldmann, LCSW

## 2020-03-27 NOTE — Plan of Care (Signed)

## 2020-03-27 NOTE — TOC Initial Note (Addendum)
Transition of Care [facility]) - Initial/Assessment Note    Patient Details  Name: Emma Maldonado MRN: [id number] Date of Birth: 10-May-1950  [facility] of [facility]) CM/SW Contact:    Emma Goldmann, LCSW Phone Number: 03/27/2020, 3:20 PM  Clinical Narrative:  CSW talked with daughter Emma Maldonado [phone]) by phone regarding patient's discharge disposition. Emma Maldonado is from [facility] and ST rehab is being recommended. Daughter and husband haven't been pleased with patient's care at the ALF and want other placement and this was discussed. When asked how was her mom's ALF bill paid, Emma Maldonado was not sure and talked with her dad and also contacted [facility] and left a message to be contacted. Per daughter, her dad receives her mom's social security check and this is all he has to give to the facility.  CSW explained to daughter that it is important that a plan be in place for   where her mom is going after ST rehab, especially since they are not happy with the care at [facility], and daughter expressed understanding. When asked, she does not know if the VA assists in paying her bill - patient has [employer] for [employer] as secondary.  SNF list placed in patient's room and daughter informed.                  Expected Discharge Plan: Skilled Nursing Facility Barriers to Discharge: Continued Medical Work up   Patient Goals and CMS Choice Patient states their goals for this hospitalization and ongoing recovery are:: Husband and daughter agreeable to ST rehab as recommended CMS Medicare.gov Compare Post Acute Care list provided to:: Patient Represenative (must comment) (Daughter informed regarding Medicare.gov for information on SNF's and ALF's) Choice offered to / list presented to : Adult Children  Expected Discharge Plan and Services Expected Discharge Plan: Skilled Nursing Facility In-house Referral: Clinical Social Work   Post  Acute Care Choice: Skilled Nursing Facility Living arrangements for the past 2 months: Assisted Living Facility ([facility])                                      Prior Living Arrangements/Services Living arrangements for the past 2 months: Assisted Living Facility ([facility]) Lives with:: Facility Resident ([facility]) Patient language and need for interpreter reviewed:: Yes (Patient speaks Spanish) Do you feel safe going back to the place where you live?: No   Daughter and spouse not happy with care at ALF and want another faclity for patient after ST rehab  Need for Family Participation in Patient Care: Yes (Comment) Care giver support system in place?: Yes (comment)   Criminal Activity/Legal Involvement Pertinent to Current Situation/Hospitalization: No - Comment as needed  Activities of Daily Living      Permission Sought/Granted Permission sought to share information with : Other (comment) (Patient not fullty oriented. Talked with daughter Emma Maldonado by phone) Permission granted to share information with : No              Emotional Assessment Appearance:: Appears stated age Micah Flesher to room to leave SNF information) Attitude/Demeanor/Rapport: Unable to Assess (Did not engage with patient) Affect (typically observed): Unable to Assess (Did not engage with patient) Orientation: : Oriented to Self,Oriented to Place Alcohol / Substance Use: Alcohol Use,Illicit Drugs,Not Applicable (Per H&P patient has never smoked and does not drink or use  illicit drugs) Psych Involvement: No (comment)  Admission diagnosis:  Confusion [R41.0] UTI (urinary tract infection) [N39.0] Acute cystitis without hematuria [N30.00] Patient Active Problem List   Diagnosis Date Noted  . UTI (urinary tract infection) 03/25/2020  . Diabetes mellitus type 2, uncomplicated (HCC) 03/25/2020  . Hypokalemia 03/25/2020  . Essential hypertension 03/25/2020  . Dementia (HCC)  03/25/2020  . Altered mental status 03/25/2020  . Uncontrolled type 2 diabetes mellitus with hyperglycemia (HCC) 03/25/2020  . Accelerated hypertension 03/28/2012  . Viral gastroenteritis 03/28/2012  . Sepsis (HCC) 03/26/2012  . History of renal transplant 03/26/2012  . Diabetes mellitus (HCC) 03/26/2012  . Hyperlipidemia 03/26/2012   PCP:  Patient, No Pcp Per Pharmacy:  No Pharmacies Listed    Social Determinants of Health (SDOH) Interventions  Family not pleased with current ALF placement and this was discussed   Readmission Risk Interventions No flowsheet data found.

## 2020-03-28 LAB — GLUCOSE, CAPILLARY
Glucose-Capillary: 71 mg/dL (ref 70–99)
Glucose-Capillary: 274 mg/dL — ABNORMAL HIGH (ref 70–99)
Glucose-Capillary: 221 mg/dL — ABNORMAL HIGH (ref 70–99)
Glucose-Capillary: 278 mg/dL — ABNORMAL HIGH (ref 70–99)

## 2020-03-28 LAB — PROCALCITONIN: Procalcitonin: 0.44 ng/mL

## 2020-03-28 LAB — MAGNESIUM: Magnesium: 1.7 mg/dL (ref 1.7–2.4)

## 2020-03-28 LAB — BRAIN NATRIURETIC PEPTIDE: B Natriuretic Peptide: 124.5 pg/mL — ABNORMAL HIGH (ref 0.0–100.0)

## 2020-03-28 LAB — SARS CORONAVIRUS 2 (TAT 6-24 HRS): SARS Coronavirus 2: NEGATIVE

## 2020-03-28 MED ORDER — CEPHALEXIN 500 MG PO CAPS: 500.0000 mg | ORAL_CAPSULE | Freq: Three times a day (TID) | ORAL | 0 refills | Status: AC

## 2020-03-28 MED ORDER — LEVOTHYROXINE SODIUM 100 MCG PO TABS
100.0000 ug | ORAL_TABLET | Freq: Every day | ORAL | Status: DC
  Administered 2020-03-28 – 2020-03-29 (×2): 100 ug via ORAL
  Filled 2020-03-28 (×2): qty 1

## 2020-03-28 MED ORDER — PREDNISONE 5 MG PO TABS
5.0000 mg | ORAL_TABLET | Freq: Every morning | ORAL | Status: DC
  Administered 2020-03-28 – 2020-03-29 (×2): 5 mg via ORAL
  Filled 2020-03-28 (×2): qty 1

## 2020-03-28 MED ORDER — TACROLIMUS 0.5 MG PO CAPS
0.5000 mg | ORAL_CAPSULE | Freq: Every day | ORAL | Status: DC
  Administered 2020-03-28 – 2020-03-29 (×2): 0.5 mg via ORAL
  Filled 2020-03-28 (×2): qty 1

## 2020-03-28 MED ORDER — FLUDROCORTISONE ACETATE 0.1 MG PO TABS
0.1000 mg | ORAL_TABLET | Freq: Every day | ORAL | Status: DC
  Administered 2020-03-28: 0.1 mg via ORAL
  Filled 2020-03-28 (×2): qty 1

## 2020-03-28 MED ORDER — HYDROCORTISONE NA SUCCINATE PF 100 MG IJ SOLR
50.0000 mg | Freq: Once | INTRAMUSCULAR | Status: AC
  Administered 2020-03-28: 50 mg via INTRAVENOUS
  Filled 2020-03-28: qty 2

## 2020-03-28 NOTE — Discharge Summary (Addendum)
Emma Maldonado MWU:[id number] DOB: [birth year] DOA: 03/24/2020  PCP: Patient, No Pcp Per  Admit date: 03/24/2020  Discharge date: 03/29/2020  Admitted From: SNF   Disposition:  SNF   Recommendations for Outpatient Follow-up:   Follow up with PCP in 1-2 weeks  PCP Please obtain BMP/CBC, 2 view CXR in 1week,  (see Discharge instructions)   PCP Please follow up on the following pending results:    Home Health: None   Equipment/Devices: None  Consultations: None  Discharge Condition: Stable    CODE STATUS: Full    Diet Recommendation: Heart Healthy Low Carb  Diet Order            Diet - low sodium heart healthy           Diet heart healthy/carb modified Room service appropriate? Yes; Fluid consistency: Thin  Diet effective now                  Chief Complaint  Patient presents with  . Fall  . Hyperglycemia     Brief history of present illness from the day of admission and additional interim summary    Emma Kievit Riceis a 70 y.o.femalewith medical history significant forDM, HTN, renal transplant and dementiapresents byEMS from [facility], apparently she has been getting gradually more confused and has had a few falls at [facility] for the last few days.  In the ER head CT was negative, her exam was nonfocal, she had evidence of UTI induced toxic encephalopathy on top of underlying dementia and admitted to the hospital.                                                                 Hospital Course   1. Toxic encephalopathy in a patient with underlying dementia due to UTI.  She was treated with IV Rocephin, overall at baseline, DC to SNF on 6 more days of PO Keflex.  2. UTI - urine culture growing E. Coli pan sensitive, DC on Keflex x 7 days.  3. Dementia. She is on memantine &  sertraline currently will monitor.  4.  Urinary retention - Foley + Flomax, remove foley 03/31/20 at SNF.  5.  Dyslipidemia - continue statin.  6.  Hypokalemia - replaced.  7.  Hypertension.  Currently on Norvasc.  8.  Renal transplant.  Continue home medications.    9. Chronic Tremors - follow with Neuro.  10. DM2 - on home regimen, check CBGs QAC-HS, poor outpt glycemic control due to hyperglycemia.   Lab Results  Component Value Date   HGBA1C 9.9 (H) 03/25/2020     Discharge diagnosis     Principal Problem:   UTI (urinary tract infection) Active Problems:   Hypokalemia   Essential hypertension   Dementia (HCC)  Altered mental status   Uncontrolled type 2 diabetes mellitus with hyperglycemia (HCC)    Discharge instructions    Discharge Instructions    Diet - low sodium heart healthy   Complete by: As directed    Discharge instructions   Complete by: As directed    Follow with Primary MD  in 7 days   Get CBC, CMP, 2 view Chest X ray -  checked next visit within 1 week by Primary MD or SNF MD   Activity: As tolerated with Full fall precautions use walker/cane & assistance as needed  Disposition SNF  Diet: Heart Healthy Low Carb, check CBGs QAC-HS  Special Instructions: If you have smoked or chewed Tobacco  in the last 2 yrs please stop smoking, stop any regular Alcohol  and or any Recreational drug use.  On your next visit with your primary care physician please Get Medicines reviewed and adjusted.  Please request your Prim.MD to go over all Hospital Tests and Procedure/Radiological results at the follow up, please get all Hospital records sent to your Prim MD by signing hospital release before you go home.  If you experience worsening of your admission symptoms, develop shortness of breath, life threatening emergency, suicidal or homicidal thoughts you must seek medical attention immediately by calling 911 or calling your MD immediately  if symptoms  less severe.  You Must read complete instructions/literature along with all the possible adverse reactions/side effects for all the Medicines you take and that have been prescribed to you. Take any new Medicines after you have completely understood and accpet all the possible adverse reactions/side effects.   Increase activity slowly   Complete by: As directed       Discharge Medications   Allergies as of 03/29/2020   No Known Allergies     Medication List    TAKE these medications   acetaminophen 500 MG tablet Commonly known as: TYLENOL Take 500 mg by mouth every 6 (six) hours as needed for fever or headache (pain, discomfort).   alum & mag hydroxide-simeth 200-200-20 MG/5ML suspension Commonly known as: MAALOX/MYLANTA Take 30 mLs by mouth every 6 (six) hours as needed for indigestion or heartburn.   cephALEXin 500 MG capsule Commonly known as: KEFLEX Take 1 capsule (500 mg total) by mouth 3 (three) times daily for 7 days.   cholecalciferol 1000 units tablet Commonly known as: VITAMIN D Take 2,000 Units by mouth daily.   diphenoxylate-atropine 2.5-0.025 MG tablet Commonly known as: LOMOTIL Take 1 tablet by mouth daily.   fludrocortisone 0.1 MG tablet Commonly known as: FLORINEF Take 0.1 mg by mouth at bedtime.   guaifenesin 100 MG/5ML syrup Commonly known as: ROBITUSSIN Take 200 mg by mouth every 6 (six) hours as needed for cough.   insulin aspart 100 UNIT/ML injection Commonly known as: novoLOG Inject 22 Units into the skin 3 (three) times daily with meals.   insulin glargine 100 UNIT/ML injection Commonly known as: LANTUS Inject 10-50 Units into the skin every evening. Inject 10 units in the morning and 50 units at night.   levothyroxine 100 MCG tablet Commonly known as: SYNTHROID Take 100 mcg by mouth daily.   loperamide 2 MG capsule Commonly known as: IMODIUM Take 2 mg by mouth as needed for diarrhea or loose stools.   losartan 25 MG tablet Commonly  known as: COZAAR Take 25 mg by mouth daily.   magnesium hydroxide 400 MG/5ML suspension Commonly known as: MILK OF MAGNESIA Take 30 mLs by mouth at bedtime  as needed for mild constipation.   magnesium oxide 400 MG tablet Commonly known as: MAG-OX Take 400 mg by mouth daily.   memantine 10 MG tablet Commonly known as: NAMENDA Take 10 mg by mouth every 12 (twelve) hours.   predniSONE 5 MG tablet Commonly known as: DELTASONE Take 5 mg by mouth every morning.   QUEtiapine 25 MG tablet Commonly known as: SEROQUEL Take 25 mg by mouth at bedtime.   rosuvastatin 5 MG tablet Commonly known as: CRESTOR Take 5 mg by mouth daily.   sertraline 50 MG tablet Commonly known as: ZOLOFT Take 125 mg by mouth daily.   tacrolimus 0.5 MG capsule Commonly known as: PROGRAF Take 1 capsule (0.5 mg total) by mouth daily.        Follow-up Information    Tat, Octaviano Batty, DO. Schedule an appointment as soon as possible for a visit in 1 week(s).   Specialty: Neurology Why: tremors Contact information: [address] [place]  [address] [place] [address] [phone]               Major procedures and Radiology Reports - PLEASE review detailed and final reports thoroughly  -       DG Chest 2 View  Result Date: 03/12/2020 CLINICAL DATA:  Chest pain EXAM: CHEST - 2 VIEW COMPARISON:  05/06/2019 FINDINGS: There are streaky airspace opacities at the lung bases favored to represent areas of atelectasis. There is no large pleural effusion or pneumothorax. The heart size is unremarkable. Aortic calcifications are noted. IMPRESSION: No active cardiopulmonary disease. Electronically Signed   By: Katherine Mantle M.D.   On: 03/12/2020 19:56   CT Head Wo Contrast  Result Date: 03/24/2020 CLINICAL DATA:  Facial trauma, dementia EXAM: CT HEAD WITHOUT CONTRAST TECHNIQUE: Contiguous axial images were obtained from the base of the skull through the vertex without intravenous contrast. COMPARISON:   None. FINDINGS: Brain: No acute infarct or hemorrhage. Lateral ventricles and midline structures are unremarkable. No acute extra-axial fluid collections. No mass effect. Vascular: Moderate atherosclerosis of the internal carotid arteries. No hyperdense vessel. Skull: Normal. Negative for fracture or focal lesion. Sinuses/Orbits: No acute finding. Other: None. IMPRESSION: 1. No acute intracranial process. Electronically Signed   By: Sharlet Salina M.D.   On: 03/24/2020 23:08   DG Chest Port 1 View  Result Date: 03/24/2020 CLINICAL DATA:  Larey Seat, hyperglycemia, sepsis EXAM: PORTABLE CHEST 1 VIEW COMPARISON:  03/12/2020 FINDINGS: The heart size and mediastinal contours are within normal limits. Both lungs are clear. The visualized skeletal structures are unremarkable. IMPRESSION: No active disease. Electronically Signed   By: Sharlet Salina M.D.   On: 03/24/2020 22:52    Micro Results     Recent Results (from the past 240 hour(s))  Blood culture (routine single)     Status: None (Preliminary result)   Collection Time: 03/24/20 11:34 PM   Specimen: BLOOD  Result Value Ref Range Status   Specimen Description BLOOD RIGHT ARM  Final   Special Requests   Final    BOTTLES DRAWN AEROBIC AND ANAEROBIC Blood Culture adequate volume   Culture   Final    NO GROWTH 4 DAYS Performed at [facility], [address], [place], [place] [address]    Report Status PENDING  Incomplete  SARS CORONAVIRUS 2 (TAT 6-24 HRS) Nasopharyngeal Nasopharyngeal Swab     Status: None   Collection Time: 03/24/20 11:40 PM   Specimen: Nasopharyngeal Swab  Result Value Ref Range Status  SARS Coronavirus 2 NEGATIVE NEGATIVE Final    Comment: (NOTE) SARS-CoV-2 target nucleic acids are NOT DETECTED.  The SARS-CoV-2 RNA is generally detectable in upper and lower respiratory specimens during the acute phase of infection. Negative results do not preclude SARS-CoV-2 infection, do not rule out co-infections with other  pathogens, and should not be used as the sole basis for treatment or other patient management decisions. Negative results must be combined with clinical observations, patient history, and epidemiological information. The expected result is Negative.  Fact Sheet for Patients: HairSlick.no  Fact Sheet for Healthcare Providers: quierodirigir.com  This test is not yet approved or cleared by the [place] FDA and  has been authorized for detection and/or diagnosis of SARS-CoV-2 by FDA under an Emergency Use Authorization (EUA). This EUA will remain  in effect (meaning this test can be used) for the duration of the COVID-19 declaration under Se ction 564(b)(1) of the Act, 21 U.S.C. section 360bbb-3(b)(1), unless the authorization is terminated or revoked sooner.  Performed at [facility], [address], [place], [place] [address]   Urine culture     Status: Abnormal   Collection Time: 03/25/20  3:16 AM   Specimen: Urine, Clean Catch  Result Value Ref Range Status   Specimen Description URINE, CLEAN CATCH  Final   Special Requests   Final    NONE Performed at [facility], [address], [place], [place] [address]    Culture >=100,000 COLONIES/mL ESCHERICHIA COLI (A)  Final   Report Status 03/27/2020 FINAL  Final   Organism ID, Bacteria ESCHERICHIA COLI (A)  Final      Susceptibility   Escherichia coli - MIC*    AMPICILLIN 4 SENSITIVE Sensitive     CEFAZOLIN <=4 SENSITIVE Sensitive     CEFEPIME <=0.12 SENSITIVE Sensitive     CEFTRIAXONE <=0.25 SENSITIVE Sensitive     CIPROFLOXACIN <=0.25 SENSITIVE Sensitive     GENTAMICIN <=1 SENSITIVE Sensitive     IMIPENEM <=0.25 SENSITIVE Sensitive     NITROFURANTOIN <=16 SENSITIVE Sensitive     TRIMETH/SULFA <=20 SENSITIVE Sensitive     AMPICILLIN/SULBACTAM <=2 SENSITIVE Sensitive     PIP/TAZO <=4 SENSITIVE Sensitive     * >=100,000 COLONIES/mL ESCHERICHIA COLI  SARS  CORONAVIRUS 2 (TAT 6-24 HRS) Nasopharyngeal Nasopharyngeal Swab     Status: None   Collection Time: 03/28/20  1:57 PM   Specimen: Nasopharyngeal Swab  Result Value Ref Range Status   SARS Coronavirus 2 NEGATIVE NEGATIVE Final    Comment: (NOTE) SARS-CoV-2 target nucleic acids are NOT DETECTED.  The SARS-CoV-2 RNA is generally detectable in upper and lower respiratory specimens during the acute phase of infection. Negative results do not preclude SARS-CoV-2 infection, do not rule out co-infections with other pathogens, and should not be used as the sole basis for treatment or other patient management decisions. Negative results must be combined with clinical observations, patient history, and epidemiological information. The expected result is Negative.  Fact Sheet for Patients: HairSlick.no  Fact Sheet for Healthcare Providers: quierodirigir.com  This test is not yet approved or cleared by the [place] FDA and  has been authorized for detection and/or diagnosis of SARS-CoV-2 by FDA under an Emergency Use Authorization (EUA). This EUA will remain  in effect (meaning this test can be used) for the duration of the COVID-19 declaration under Se ction 564(b)(1) of the Act, 21 U.S.C. section 360bbb-3(b)(1), unless the authorization is terminated or revoked sooner.  Performed at [facility], [address] [place], [place] [address]     Today   Subjective    Dian Muramoto today has no headache,no chest abdominal pain,no new weakness tingling or numbness, feels much better     Objective   Blood pressure (!) 148/76, pulse 70, temperature 98 F (36.7 C), temperature source Oral, resp. rate 16, height 5\' 2"  (1.575 m), weight 69.1 kg, SpO2 93 %.   Intake/Output Summary (Last 24 hours) at 03/29/2020 0910 Last data filed at 03/29/2020 0845 Gross per 24 hour  Intake 1000 ml  Output 1900 ml  Net -900 ml     Exam  Awake, pleasantly confused, No new F.N deficits,   [place].AT,PERRAL Supple Neck,No JVD, No cervical lymphadenopathy appriciated.  Symmetrical Chest wall movement, Good air movement bilaterally, CTAB RRR,No Gallops,Rubs or new Murmurs, No Parasternal Heave +ve B.Sounds, Abd Soft, Non tender, No organomegaly appriciated, No rebound -guarding or rigidity. No Cyanosis, Clubbing or edema, No new Rash or bruise   Data Review   CBC w Diff:  Lab Results  Component Value Date   WBC 9.1 03/26/2020   HGB 12.7 03/26/2020   HCT 39.5 03/26/2020   PLT 132 (L) 03/26/2020   LYMPHOPCT 9 03/24/2020   MONOPCT 5 03/24/2020   EOSPCT 0 03/24/2020   BASOPCT 0 03/24/2020    CMP:  Lab Results  Component Value Date   NA 137 03/26/2020   K 3.8 03/26/2020   CL 106 03/26/2020   CO2 23 03/26/2020   BUN 8 03/26/2020   CREATININE 0.64 03/26/2020   PROT 6.5 03/24/2020   ALBUMIN 3.4 (L) 03/24/2020   BILITOT 1.4 (H) 03/24/2020   ALKPHOS 76 03/24/2020   AST 16 03/24/2020   ALT 15 03/24/2020  .   Total Time in preparing paper work, data evaluation and todays exam - 35 minutes  Susa RaringPrashant Anali Cabanilla M.D on 03/29/2020 at 9:10 AM  [facility]

## 2020-03-28 NOTE — TOC Progression Note (Addendum)
Transition of Care [facility]) - Progression Note    Patient Details  Name: Emma Maldonado MRN: [id number] Date of Birth: 1950-11-27  Transition of Care [facility]) CM/SW Contact  Okey Dupre Lazaro Arms, LCSW Phone Number: 03/28/2020, 3:07 PM  Clinical Narrative: Daughter, [name] [phone]) provided with facility responses and chose [facility]. Call made to [facility] admissions director Victorino December [phone]) and message left. Talked later with Elwanda Brooklyn, with [facility] [phone]), and she will come to visit with patient. They want to ensure that patient can speak and understand English to ensure they can appropriately care for her.  4:05 pm - Ms. Holsey came to hospital and visited with patient (CSW was present also). Ms. Rommel was able to communicate with Ms. Holsey and this SW. CSW advised that patient can d/c tomorrow and Ms. Holsey will be in touch with CSW tomorrow.   CSW checked labs and COVID test is pending.  4:45 pm - Talked with daughter [name] [phone]) and informed her that [facility] can take her mom tomorrow.    Expected Discharge Plan: Skilled Nursing Facility Barriers to Discharge: Continued Medical Work up  Expected Discharge Plan and Services Expected Discharge Plan: Skilled Nursing Facility In-house Referral: Clinical Social Work   Post Acute Care Choice: Skilled Nursing Facility Living arrangements for the past 2 months: Assisted Living Facility ([facility]) Expected Discharge Date: 03/28/20                                   Social Determinants of Health (SDOH) Interventions  None needed or requested at this time   Readmission Risk Interventions No flowsheet data found.

## 2020-03-28 NOTE — Plan of Care (Signed)
  Problem: Acute Rehab PT Goals(only PT should resolve) Goal: Patient Will Transfer Sit To/From Stand Outcome: Adequate for Discharge Goal: Pt Will Transfer Bed To Chair/Chair To Bed Outcome: Adequate for Discharge Goal: Pt Will Ambulate Outcome: Adequate for Discharge   Problem: Urinary Elimination: Goal: Signs and symptoms of infection will decrease Outcome: Adequate for Discharge   Problem: Education: Goal: Knowledge of General Education information will improve Description: Including pain rating scale, medication(s)/side effects and non-pharmacologic comfort measures Outcome: Adequate for Discharge   Problem: Health Behavior/Discharge Planning: Goal: Ability to manage health-related needs will improve Outcome: Adequate for Discharge   Problem: Clinical Measurements: Goal: Ability to maintain clinical measurements within normal limits will improve Outcome: Adequate for Discharge Goal: Will remain free from infection Outcome: Adequate for Discharge   Problem: Elimination: Goal: Will not experience complications related to bowel motility Outcome: Adequate for Discharge Goal: Will not experience complications related to urinary retention Outcome: Adequate for Discharge   Problem: Safety: Goal: Ability to remain free from injury will improve Outcome: Adequate for Discharge   Problem: Acute Rehab OT Goals (only OT should resolve) Goal: Pt. Will Perform Grooming Outcome: Adequate for Discharge Goal: Pt. Will Perform Lower Body Bathing Outcome: Adequate for Discharge Goal: Pt. Will Perform Lower Body Dressing Outcome: Adequate for Discharge Goal: Pt. Will Transfer To Toilet Outcome: Adequate for Discharge

## 2020-03-28 NOTE — Progress Notes (Signed)
Physical Therapy Treatment Patient Details Name: Emma Maldonado MRN: [id number] DOB: 08-15-1950 Today's Date: 03/28/2020    History of Present Illness Pt is a 70 y/o female admitted 3/13 secondary to fall and AMS. Found to have encephalopathy secondary to UTI. PMH includes dementia, HTN, and DM.    PT Comments    Pt received in bed, cooperative and pleasant. Making good progress towards PT goals. Able to progress gait distance today. Generally min guard for mobility, needing cueing for hand placement and navigation of RW. Able to follow commands consistent and fair job sequencing motions together. At times, some slow processing but unclear if due to language barrier or cognition issues. Pt left in chair will all needs, call bell within reach, and chair alarm active. Will continue to follow acutely.   Follow Up Recommendations  SNF;Supervision/Assistance - 24 hour     Equipment Recommendations  None recommended by PT    Recommendations for Other Services       Precautions / Restrictions Precautions Precautions: Fall Precaution Comments: use brief for ambulation Restrictions Weight Bearing Restrictions: No    Mobility  Bed Mobility Overal bed mobility: Needs Assistance Bed Mobility: Supine to Sit     Supine to sit: Supervision     General bed mobility comments: Supervision for safety.    Transfers   Equipment used: Rolling walker (2 wheeled) Transfers: Sit to/from Stand Sit to Stand: [facility] transfer comment: Min guard for safety, no physical assist given, cueing for hand placement  Ambulation/Gait Ambulation/Gait assistance: Min guard Gait Distance (Feet): 55 Feet Assistive device: Rolling walker (2 wheeled)   Gait velocity: decreased   General Gait Details: Pt steady with RW, min guard for safety, cueing for navigation. Used brief during ambulation due to history of incontinence   Animal nutritionist Rankin (Stroke Patients Only)       Balance Overall balance assessment: Needs assistance Sitting-balance support: No upper extremity supported;Feet supported Sitting balance-Leahy Scale: Good Sitting balance - Comments: Able to shift weight to pull brief up her legs   Standing balance support: Bilateral upper extremity supported;During functional activity Standing balance-Leahy Scale: Poor Standing balance comment: Benefits from UE support especially for longer ambulatory distances, was fairly steady standing without AD to pull brief on                            Cognition Arousal/Alertness: Awake/alert Behavior During Therapy: WFL for tasks assessed/performed Overall Cognitive Status: Within Functional Limits for tasks assessed Area of Impairment: Memory;Safety/judgement;Problem solving                   Current Attention Level: Sustained Memory: Decreased recall of precautions Following Commands: Follows one step commands with increased time   Awareness: Intellectual Problem Solving: Slow processing;Requires verbal cues;Requires tactile cues General Comments: Somewhat difficult to assess due to language barrier. Unclear if slow processing is due to language barrier or cognition      Exercises      General Comments        Pertinent Vitals/Pain Faces Pain Scale: Hurts a little bit Pain Location: legs Pain Descriptors / Indicators: Grimacing Pain Intervention(s): Monitored during session;Repositioned    Home Living  Prior Function            PT Goals (current goals can now be found in the care plan section) Acute Rehab PT Goals Patient Stated Goal: for legs to stop hurting    Frequency    Min 2X/week      PT Plan      Co-evaluation              AM-PAC PT "6 Clicks" Mobility   Outcome Measure  Help needed turning from your back to your side while in a flat bed without using bedrails?:  None Help needed moving from lying on your back to sitting on the side of a flat bed without using bedrails?: A Little Help needed moving to and from a bed to a chair (including a wheelchair)?: A Little Help needed standing up from a chair using your arms (e.g., wheelchair or bedside chair)?: A Little Help needed to walk in hospital room?: A Little Help needed climbing 3-5 steps with a railing? : A Lot 6 Click Score: 18    End of Session Equipment Utilized During Treatment: Gait belt Activity Tolerance: Patient tolerated treatment well Patient left: with call bell/phone within reach;in chair;with chair alarm set Nurse Communication: Mobility status PT Visit Diagnosis: Unsteadiness on feet (R26.81);Muscle weakness (generalized) (M62.81);History of falling (Z91.81)     Time:  -     Charges:             Conley Rolls, SPT

## 2020-03-28 NOTE — Discharge Instructions (Signed)
Follow with Primary MD  in 7 days   Get CBC, CMP, 2 view Chest X ray -  checked next visit within 1 week by Primary MD or SNF MD   Activity: As tolerated with Full fall precautions use walker/cane & assistance as needed  Disposition SNF  Diet: Heart Healthy Low Carb, check CBGs QAC-HS  Special Instructions: If you have smoked or chewed Tobacco  in the last 2 yrs please stop smoking, stop any regular Alcohol  and or any Recreational drug use.  On your next visit with your primary care physician please Get Medicines reviewed and adjusted.  Please request your Prim.MD to go over all Hospital Tests and Procedure/Radiological results at the follow up, please get all Hospital records sent to your Prim MD by signing hospital release before you go home.  If you experience worsening of your admission symptoms, develop shortness of breath, life threatening emergency, suicidal or homicidal thoughts you must seek medical attention immediately by calling 911 or calling your MD immediately  if symptoms less severe.  You Must read complete instructions/literature along with all the possible adverse reactions/side effects for all the Medicines you take and that have been prescribed to you. Take any new Medicines after you have completely understood and accpet all the possible adverse reactions/side effects.

## 2020-03-28 NOTE — Care Management Important Message (Signed)
Important Message  Patient Details  Name: Emma Maldonado MRN: [id number] Date of Birth: 1950-02-21   Medicare Important Message Given:  Yes     Dalyn Kjos P Nerine Pulse 03/28/2020, 11:13 AM

## 2020-03-29 LAB — GLUCOSE, CAPILLARY
Glucose-Capillary: 232 mg/dL — ABNORMAL HIGH (ref 70–99)
Glucose-Capillary: 137 mg/dL — ABNORMAL HIGH (ref 70–99)

## 2020-03-29 LAB — MAGNESIUM: Magnesium: 1.9 mg/dL (ref 1.7–2.4)

## 2020-03-29 LAB — BRAIN NATRIURETIC PEPTIDE: B Natriuretic Peptide: 110.3 pg/mL — ABNORMAL HIGH (ref 0.0–100.0)

## 2020-03-29 LAB — PROCALCITONIN: Procalcitonin: 0.16 ng/mL

## 2020-03-29 MED ORDER — CEPHALEXIN 500 MG PO CAPS
500.0000 mg | ORAL_CAPSULE | Freq: Three times a day (TID) | ORAL | Status: DC
  Administered 2020-03-29: 500 mg via ORAL
  Filled 2020-03-29: qty 1

## 2020-03-29 NOTE — Progress Notes (Signed)
DISCHARGE NOTE HOME Nafeesah Da Michelle to be discharged Skilled nursing facility per MD order. Discussed prescriptions and follow up appointments with the patient. Prescriptions given to patient; medication list explained in detail. Patient verbalized understanding.  Skin clean, dry and intact without evidence of skin break down, no evidence of skin tears noted. IV catheter discontinued intact. Site without signs and symptoms of complications. Dressing and pressure applied. Pt denies pain at the site currently. No complaints noted.  Patient free of lines and wounds.   An After Visit Summary (AVS) was printed and given to the patient and placed in packet for receiving facility. Patient escorted via stretcher, and discharged to [facility] via [facility].  Myrtis Hopping, RN

## 2020-03-29 NOTE — Progress Notes (Signed)
[facility]                                                                                                                                                                         Patient Demographics  Emma Maldonado, is a 70 y.o. female  [id number]:[id number]  [id number]:[id number]  DOB - December 29, 1950  Admit date - 03/24/2020  Admitting Physician Carlton Adam, MD  Outpatient Primary MD for the patient is Patient, No Pcp Per  LOS - 4   Chief Complaint  Patient presents with  . Fall  . Hyperglycemia        Assessment & Plan    Patient seen briefly today due for discharge soon per Discharge done yesterday by me on 03/28/20, no further issues, Vital signs stable, patient feels fine.      Medications  Scheduled Meds: . cephALEXin  500 mg Oral Q8H  . Chlorhexidine Gluconate Cloth  6 each Topical Daily  . enoxaparin (LOVENOX) injection  40 mg Subcutaneous Q24H  . fludrocortisone  0.1 mg Oral QHS  . insulin aspart  0-15 Units Subcutaneous TID WC  . insulin aspart  0-5 Units Subcutaneous QHS  . insulin glargine  30 Units Subcutaneous BID  . levothyroxine  100 mcg Oral Q0600  . memantine  10 mg Oral BID  . predniSONE  5 mg Oral q morning  . rosuvastatin  5 mg Oral Daily  . sertraline  125 mg Oral QPM  . tacrolimus  0.5 mg Oral Daily  . tamsulosin  0.4 mg Oral Daily   Continuous Infusions: PRN Meds:.acetaminophen **OR** acetaminophen, hydrALAZINE, [DISCONTINUED] ondansetron **OR** ondansetron (ZOFRAN) IV, senna-docusate    Time Spent in minutes   10 minutes   Susa Raring M.D on 03/29/2020 at 9:09 AM  Between 7am to 7pm - Pager - [phone]  After 7pm go to [email or web address] - password [email or web address]  And look for the night coverage person covering for me after hours  [facility] Office  [phone]    Subjective:   Emma Maldonado today has, No headache, No chest pain, No abdominal pain - No  Nausea, No new weakness tingling or numbness, No Cough - SOB.   Objective:   Vitals:   03/28/20 0934 03/28/20 1658 03/28/20 2000 03/29/20 0513  BP: (!) 111/48 (!) 141/66 126/65 (!) 148/76  Pulse: 78 78 72 70  Resp: 19 17 15 16   Temp: 98.3 F (36.8 C) 98.2 F (36.8 C) 98 F (36.7 C) 98 F (36.7 C)  TempSrc:   Oral Oral  SpO2: 97% 95% 95% 93%  Weight:      Height:        Wt Readings from Last 3 Encounters:  03/26/20 69.1 kg  03/12/20 64  kg  03/27/12 63.8 kg     Intake/Output Summary (Last 24 hours) at 03/29/2020 0909 Last data filed at 03/29/2020 0845 Gross per 24 hour  Intake 1000 ml  Output 1900 ml  Net -900 ml    Exam Awake , pleasantly confused, No new F.N deficits,   Real.AT,PERRAL Supple Neck,No JVD, No cervical lymphadenopathy appriciated.  Symmetrical Chest wall movement, Good air movement bilaterally, CTAB RRR,No Gallops,Rubs or new Murmurs, No Parasternal Heave +ve B.Sounds, Abd Soft, Non tender, No organomegaly appriciated, No rebound - guarding or rigidity. No Cyanosis, Clubbing or edema, No new Rash or bruise      Data Review

## 2020-03-29 NOTE — Progress Notes (Signed)
Called report to [facility] at [phone], spoke with Morrie Sheldon.  Patient will be going to room 144.  Jean Rosenthal, RN

## 2020-03-29 NOTE — TOC Transition Note (Signed)
Transition of Care [facility]) - CM/SW Discharge Note *Discharged to [facility] *Room 144   Patient Details  Name: Emma Maldonado MRN: [id number] Date of Birth: 1950/07/10  [facility]) CM/SW Contact:  Emma Goldmann, LCSW Phone Number: 03/29/2020, 11:00 AM   Clinical Narrative:  Patient medically stable for discharge and going to [facility] for ST rehab. Discharge clinicals transmitted to facility and daughter Emma Maldonado [phone]) contacted and advised that transport would be called. Nurse provided with information to call report and transport arranged.    Final next level of care: Skilled Nursing Facility ([facility] [place]) Barriers to Discharge: Barriers Resolved   Patient Goals and CMS Choice Patient states their goals for this hospitalization and ongoing recovery are:: Family agreeable to ST rehab CMS Medicare.gov Compare Post Acute Care list provided to:: Patient Represenative (must comment) Choice offered to / list presented to : Adult Children  Discharge Placement PASRR number recieved: 03/27/20            Patient chooses bed at: Other - please specify in the comment section below: ([facility]) Patient to be transferred to facility by: Non-emergency ambulance transport Name of family member notified: Daughter Emma Maldonado - [phone] Patient and family notified of of transfer: 03/29/20  Discharge Plan and Services In-house Referral: Clinical Social Work   Post Acute Care Choice: Skilled Nursing Facility  - [facility]                             Social Determinants of Health (SDOH) Interventions  No SDOH interventions requested or needed at discharge   Readmission Risk Interventions No flowsheet data found.

## 2020-03-30 LAB — CULTURE, BLOOD (SINGLE)
Culture: NO GROWTH
Special Requests: ADEQUATE

## 2020-05-01 ENCOUNTER — Emergency Department (HOSPITAL_COMMUNITY): Payer: Medicare Other

## 2020-05-01 ENCOUNTER — Inpatient Hospital Stay (HOSPITAL_COMMUNITY)
Admission: EM | Admit: 2020-05-01 | Discharge: 2020-05-06 | DRG: 699 | Disposition: A | Discharge status: Home / Self Care | Payer: Medicare Other | Source: Skilled Nursing Facility | Attending: Internal Medicine | Admitting: Internal Medicine

## 2020-05-01 ENCOUNTER — Encounter (HOSPITAL_COMMUNITY): Payer: Self-pay

## 2020-05-01 ENCOUNTER — Other Ambulatory Visit: Payer: Self-pay

## 2020-05-01 DIAGNOSIS — E785 Hyperlipidemia, unspecified: Secondary | ICD-10-CM | POA: Diagnosis present

## 2020-05-01 DIAGNOSIS — G309 Alzheimer's disease, unspecified: Secondary | ICD-10-CM | POA: Diagnosis not present

## 2020-05-01 DIAGNOSIS — S2243XA Multiple fractures of ribs, bilateral, initial encounter for closed fracture: Secondary | ICD-10-CM | POA: Diagnosis not present

## 2020-05-01 DIAGNOSIS — L89526 Pressure-induced deep tissue damage of left ankle: Secondary | ICD-10-CM | POA: Diagnosis present

## 2020-05-01 DIAGNOSIS — Z20822 Contact with and (suspected) exposure to covid-19: Secondary | ICD-10-CM | POA: Diagnosis present

## 2020-05-01 DIAGNOSIS — F0390 Unspecified dementia without behavioral disturbance: Secondary | ICD-10-CM | POA: Diagnosis present

## 2020-05-01 DIAGNOSIS — E1165 Type 2 diabetes mellitus with hyperglycemia: Secondary | ICD-10-CM | POA: Diagnosis present

## 2020-05-01 DIAGNOSIS — N390 Urinary tract infection, site not specified: Secondary | ICD-10-CM | POA: Diagnosis present

## 2020-05-01 DIAGNOSIS — F0280 Dementia in other diseases classified elsewhere without behavioral disturbance: Secondary | ICD-10-CM | POA: Diagnosis not present

## 2020-05-01 DIAGNOSIS — T8613 Kidney transplant infection: Principal | ICD-10-CM | POA: Diagnosis present

## 2020-05-01 DIAGNOSIS — I951 Orthostatic hypotension: Secondary | ICD-10-CM | POA: Diagnosis present

## 2020-05-01 DIAGNOSIS — E119 Type 2 diabetes mellitus without complications: Secondary | ICD-10-CM

## 2020-05-01 DIAGNOSIS — E782 Mixed hyperlipidemia: Secondary | ICD-10-CM | POA: Diagnosis not present

## 2020-05-01 DIAGNOSIS — Z905 Acquired absence of kidney: Secondary | ICD-10-CM

## 2020-05-01 DIAGNOSIS — Z79890 Hormone replacement therapy (postmenopausal): Secondary | ICD-10-CM | POA: Diagnosis not present

## 2020-05-01 DIAGNOSIS — E876 Hypokalemia: Secondary | ICD-10-CM | POA: Diagnosis present

## 2020-05-01 DIAGNOSIS — N1330 Unspecified hydronephrosis: Secondary | ICD-10-CM | POA: Diagnosis not present

## 2020-05-01 DIAGNOSIS — Z90710 Acquired absence of both cervix and uterus: Secondary | ICD-10-CM | POA: Diagnosis not present

## 2020-05-01 DIAGNOSIS — Z79899 Other long term (current) drug therapy: Secondary | ICD-10-CM

## 2020-05-01 DIAGNOSIS — B9620 Unspecified Escherichia coli [E. coli] as the cause of diseases classified elsewhere: Secondary | ICD-10-CM | POA: Diagnosis present

## 2020-05-01 DIAGNOSIS — F03918 Unspecified dementia, unspecified severity, with other behavioral disturbance: Secondary | ICD-10-CM | POA: Diagnosis present

## 2020-05-01 DIAGNOSIS — I10 Essential (primary) hypertension: Secondary | ICD-10-CM | POA: Diagnosis present

## 2020-05-01 DIAGNOSIS — S2249XA Multiple fractures of ribs, unspecified side, initial encounter for closed fracture: Secondary | ICD-10-CM

## 2020-05-01 DIAGNOSIS — Z833 Family history of diabetes mellitus: Secondary | ICD-10-CM

## 2020-05-01 DIAGNOSIS — N136 Pyonephrosis: Secondary | ICD-10-CM | POA: Diagnosis present

## 2020-05-01 DIAGNOSIS — S2242XA Multiple fractures of ribs, left side, initial encounter for closed fracture: Secondary | ICD-10-CM | POA: Diagnosis present

## 2020-05-01 DIAGNOSIS — Z794 Long term (current) use of insulin: Secondary | ICD-10-CM

## 2020-05-01 DIAGNOSIS — N3001 Acute cystitis with hematuria: Secondary | ICD-10-CM

## 2020-05-01 DIAGNOSIS — N3000 Acute cystitis without hematuria: Secondary | ICD-10-CM | POA: Diagnosis not present

## 2020-05-01 DIAGNOSIS — Z940 Kidney transplant status: Secondary | ICD-10-CM

## 2020-05-01 LAB — CBC WITH DIFFERENTIAL/PLATELET
Abs Immature Granulocytes: 0.02 10*3/uL (ref 0.00–0.07)
Basophils Absolute: 0 10*3/uL (ref 0.0–0.1)
Basophils Relative: 0 %
Eosinophils Absolute: 0 10*3/uL (ref 0.0–0.5)
Eosinophils Relative: 0 %
HCT: 41.6 % (ref 36.0–46.0)
Hemoglobin: 13 g/dL (ref 12.0–15.0)
Immature Granulocytes: 0 %
Lymphocytes Relative: 17 %
Lymphs Abs: 1.4 10*3/uL (ref 0.7–4.0)
MCH: 25.5 pg — ABNORMAL LOW (ref 26.0–34.0)
MCHC: 31.3 g/dL (ref 30.0–36.0)
MCV: 81.6 fL (ref 80.0–100.0)
Monocytes Absolute: 0.6 10*3/uL (ref 0.1–1.0)
Monocytes Relative: 7 %
Neutro Abs: 6.1 10*3/uL (ref 1.7–7.7)
Neutrophils Relative %: 76 %
Platelets: 188 10*3/uL (ref 150–400)
RBC: 5.1 MIL/uL (ref 3.87–5.11)
RDW: 14.6 % (ref 11.5–15.5)
WBC: 8 10*3/uL (ref 4.0–10.5)
nRBC: 0 % (ref 0.0–0.2)

## 2020-05-01 LAB — COMPREHENSIVE METABOLIC PANEL
ALT: 16 U/L (ref 0–44)
AST: 17 U/L (ref 15–41)
Albumin: 3.2 g/dL — ABNORMAL LOW (ref 3.5–5.0)
Alkaline Phosphatase: 65 U/L (ref 38–126)
Anion gap: 7 (ref 5–15)
BUN: 13 mg/dL (ref 8–23)
CO2: 26 mmol/L (ref 22–32)
Calcium: 8.7 mg/dL — ABNORMAL LOW (ref 8.9–10.3)
Chloride: 104 mmol/L (ref 98–111)
Creatinine, Ser: 0.85 mg/dL (ref 0.44–1.00)
GFR, Estimated: >60 mL/min (ref >60)
Glucose, Bld: 358 mg/dL — ABNORMAL HIGH (ref 70–99)
Potassium: 3.1 mmol/L — ABNORMAL LOW (ref 3.5–5.1)
Sodium: 137 mmol/L (ref 135–145)
Total Bilirubin: 0.9 mg/dL (ref 0.3–1.2)
Total Protein: 6.4 g/dL — ABNORMAL LOW (ref 6.5–8.1)

## 2020-05-01 LAB — CBC
HCT: 46.3 % — ABNORMAL HIGH (ref 36.0–46.0)
Hemoglobin: 14.6 g/dL (ref 12.0–15.0)
MCH: 25.7 pg — ABNORMAL LOW (ref 26.0–34.0)
MCHC: 31.5 g/dL (ref 30.0–36.0)
MCV: 81.5 fL (ref 80.0–100.0)
Platelets: 210 10*3/uL (ref 150–400)
RBC: 5.68 MIL/uL — ABNORMAL HIGH (ref 3.87–5.11)
RDW: 14.8 % (ref 11.5–15.5)
WBC: 9.5 10*3/uL (ref 4.0–10.5)
nRBC: 0 % (ref 0.0–0.2)

## 2020-05-01 LAB — TSH: TSH: 2.396 u[IU]/mL (ref 0.350–4.500)

## 2020-05-01 LAB — MAGNESIUM: Magnesium: 1.8 mg/dL (ref 1.7–2.4)

## 2020-05-01 LAB — CREATININE, SERUM
Creatinine, Ser: 0.79 mg/dL (ref 0.44–1.00)
GFR, Estimated: >60 mL/min (ref >60)

## 2020-05-01 LAB — URINALYSIS, ROUTINE W REFLEX MICROSCOPIC
Bilirubin Urine: NEGATIVE
Glucose, UA: >=500 mg/dL — AB
Ketones, ur: NEGATIVE mg/dL
Nitrite: POSITIVE — AB
Protein, ur: NEGATIVE mg/dL
Specific Gravity, Urine: 1.013 (ref 1.005–1.030)
WBC, UA: >50 WBC/hpf — ABNORMAL HIGH (ref 0–5)
pH: 6 (ref 5.0–8.0)

## 2020-05-01 LAB — MRSA PCR SCREENING: MRSA by PCR: NEGATIVE

## 2020-05-01 LAB — GLUCOSE, CAPILLARY
Glucose-Capillary: 121 mg/dL — ABNORMAL HIGH (ref 70–99)
Glucose-Capillary: 110 mg/dL — ABNORMAL HIGH (ref 70–99)

## 2020-05-01 LAB — LIPASE, BLOOD: Lipase: 20 U/L (ref 11–51)

## 2020-05-01 MED ORDER — ROSUVASTATIN CALCIUM 5 MG PO TABS
5.0000 mg | ORAL_TABLET | Freq: Every day | ORAL | Status: DC
  Administered 2020-05-01 – 2020-05-06 (×6): 5 mg via ORAL
  Filled 2020-05-01 (×7): qty 1

## 2020-05-01 MED ORDER — INSULIN ASPART 100 UNIT/ML ~~LOC~~ SOLN
0.0000 [IU] | Freq: Every day | SUBCUTANEOUS | Status: DC
  Administered 2020-05-02 – 2020-05-05 (×3): 2 [IU] via SUBCUTANEOUS
  Filled 2020-05-01: qty 0.05

## 2020-05-01 MED ORDER — ENSURE ENLIVE PO LIQD
237.0000 mL | Freq: Two times a day (BID) | ORAL | Status: DC
  Administered 2020-05-02 – 2020-05-05 (×7): 237 mL via ORAL

## 2020-05-01 MED ORDER — HYDROMORPHONE HCL 1 MG/ML IJ SOLN
0.5000 mg | INTRAMUSCULAR | Status: DC | PRN
Start: 2020-05-01 — End: 2020-05-06

## 2020-05-01 MED ORDER — TACROLIMUS 0.5 MG PO CAPS
0.5000 mg | ORAL_CAPSULE | Freq: Every day | ORAL | Status: DC
  Administered 2020-05-02 – 2020-05-06 (×5): 0.5 mg via ORAL
  Filled 2020-05-01 (×5): qty 1

## 2020-05-01 MED ORDER — INSULIN ASPART 100 UNIT/ML ~~LOC~~ SOLN
0.0000 [IU] | Freq: Three times a day (TID) | SUBCUTANEOUS | Status: DC
  Administered 2020-05-01: 3 [IU] via SUBCUTANEOUS
  Administered 2020-05-02: 5 [IU] via SUBCUTANEOUS
  Administered 2020-05-02 – 2020-05-03 (×2): 8 [IU] via SUBCUTANEOUS
  Administered 2020-05-03: 2 [IU] via SUBCUTANEOUS
  Administered 2020-05-03: 15 [IU] via SUBCUTANEOUS
  Administered 2020-05-04: 8 [IU] via SUBCUTANEOUS
  Administered 2020-05-04: 5 [IU] via SUBCUTANEOUS
  Administered 2020-05-04: 2 [IU] via SUBCUTANEOUS
  Administered 2020-05-05: 8 [IU] via SUBCUTANEOUS
  Administered 2020-05-05: 3 [IU] via SUBCUTANEOUS
  Administered 2020-05-05: 2 [IU] via SUBCUTANEOUS
  Administered 2020-05-06 (×2): 3 [IU] via SUBCUTANEOUS
  Administered 2020-05-06: 5 [IU] via SUBCUTANEOUS
  Filled 2020-05-01: qty 0.15

## 2020-05-01 MED ORDER — ENOXAPARIN SODIUM 40 MG/0.4ML ~~LOC~~ SOLN
40.0000 mg | SUBCUTANEOUS | Status: DC
  Administered 2020-05-01 – 2020-05-05 (×5): 40 mg via SUBCUTANEOUS
  Filled 2020-05-01 (×5): qty 0.4

## 2020-05-01 MED ORDER — MEMANTINE HCL 10 MG PO TABS
10.0000 mg | ORAL_TABLET | Freq: Two times a day (BID) | ORAL | Status: DC
  Administered 2020-05-01 – 2020-05-06 (×10): 10 mg via ORAL
  Filled 2020-05-01 (×10): qty 1

## 2020-05-01 MED ORDER — HYDRALAZINE HCL 20 MG/ML IJ SOLN: 10.0000 mg | Freq: Four times a day (QID) | INTRAMUSCULAR | Status: DC | PRN

## 2020-05-01 MED ORDER — ONDANSETRON HCL 4 MG/2ML IJ SOLN
4.0000 mg | Freq: Once | INTRAMUSCULAR | Status: AC
  Administered 2020-05-01: 4 mg via INTRAVENOUS
  Filled 2020-05-01: qty 2

## 2020-05-01 MED ORDER — INSULIN GLARGINE 100 UNIT/ML ~~LOC~~ SOLN
50.0000 [IU] | Freq: Every day | SUBCUTANEOUS | Status: DC
  Administered 2020-05-01 – 2020-05-02 (×2): 50 [IU] via SUBCUTANEOUS
  Filled 2020-05-01 (×2): qty 0.5

## 2020-05-01 MED ORDER — INSULIN GLARGINE 100 UNIT/ML ~~LOC~~ SOLN: 10.0000 [IU] | Freq: Every evening | SUBCUTANEOUS | Status: DC

## 2020-05-01 MED ORDER — PREDNISONE 5 MG PO TABS
5.0000 mg | ORAL_TABLET | Freq: Every day | ORAL | Status: DC
  Administered 2020-05-02 – 2020-05-06 (×5): 5 mg via ORAL
  Filled 2020-05-01 (×5): qty 1

## 2020-05-01 MED ORDER — ACETAMINOPHEN 325 MG PO TABS
650.0000 mg | ORAL_TABLET | Freq: Four times a day (QID) | ORAL | Status: DC | PRN
  Administered 2020-05-01 – 2020-05-05 (×2): 650 mg via ORAL
  Filled 2020-05-01 (×2): qty 2

## 2020-05-01 MED ORDER — SERTRALINE HCL 25 MG PO TABS
125.0000 mg | ORAL_TABLET | Freq: Every day | ORAL | Status: DC
  Administered 2020-05-02 – 2020-05-06 (×5): 125 mg via ORAL
  Filled 2020-05-01 (×5): qty 1

## 2020-05-01 MED ORDER — SODIUM CHLORIDE 0.9 % IV SOLN
1.0000 g | Freq: Once | INTRAVENOUS | Status: AC
  Administered 2020-05-02: 1 g via INTRAVENOUS
  Filled 2020-05-01: qty 1

## 2020-05-01 MED ORDER — QUETIAPINE FUMARATE 25 MG PO TABS
25.0000 mg | ORAL_TABLET | Freq: Every day | ORAL | Status: DC
  Administered 2020-05-01 – 2020-05-05 (×5): 25 mg via ORAL
  Filled 2020-05-01 (×5): qty 1

## 2020-05-01 MED ORDER — IOHEXOL 300 MG/ML  SOLN
100.0000 mL | Freq: Once | INTRAMUSCULAR | Status: AC | PRN
  Administered 2020-05-01: 100 mL via INTRAVENOUS

## 2020-05-01 MED ORDER — SODIUM CHLORIDE 0.9 % IV SOLN
1.0000 g | Freq: Once | INTRAVENOUS | Status: AC
  Administered 2020-05-01: 1 g via INTRAVENOUS
  Filled 2020-05-01: qty 10

## 2020-05-01 MED ORDER — LEVOTHYROXINE SODIUM 100 MCG PO TABS
100.0000 ug | ORAL_TABLET | Freq: Every day | ORAL | Status: DC
  Administered 2020-05-02 – 2020-05-06 (×5): 100 ug via ORAL
  Filled 2020-05-01 (×5): qty 1

## 2020-05-01 MED ORDER — INSULIN GLARGINE 100 UNIT/ML ~~LOC~~ SOLN
10.0000 [IU] | Freq: Every day | SUBCUTANEOUS | Status: DC
  Administered 2020-05-02 – 2020-05-03 (×2): 10 [IU] via SUBCUTANEOUS
  Filled 2020-05-01 (×2): qty 0.1

## 2020-05-01 MED ORDER — ONDANSETRON HCL 4 MG/2ML IJ SOLN: 4.0000 mg | Freq: Four times a day (QID) | INTRAMUSCULAR | Status: DC | PRN

## 2020-05-01 MED ORDER — MORPHINE SULFATE (PF) 2 MG/ML IV SOLN
2.0000 mg | Freq: Once | INTRAVENOUS | Status: AC
  Administered 2020-05-01: 2 mg via INTRAVENOUS
  Filled 2020-05-01: qty 1

## 2020-05-01 MED ORDER — ACETAMINOPHEN 650 MG RE SUPP: 650.0000 mg | Freq: Four times a day (QID) | RECTAL | Status: DC | PRN

## 2020-05-01 MED ORDER — OXYCODONE HCL 5 MG PO TABS
5.0000 mg | ORAL_TABLET | ORAL | Status: DC | PRN
  Administered 2020-05-01 – 2020-05-04 (×9): 5 mg via ORAL
  Filled 2020-05-01 (×9): qty 1

## 2020-05-01 MED ORDER — POTASSIUM CHLORIDE 20 MEQ PO PACK
40.0000 meq | PACK | Freq: Two times a day (BID) | ORAL | Status: AC
  Administered 2020-05-01 – 2020-05-02 (×2): 40 meq via ORAL
  Filled 2020-05-01 (×2): qty 2

## 2020-05-01 MED ORDER — ONDANSETRON HCL 4 MG PO TABS: 4.0000 mg | ORAL_TABLET | Freq: Four times a day (QID) | ORAL | Status: DC | PRN

## 2020-05-01 MED ORDER — FLUDROCORTISONE ACETATE 0.1 MG PO TABS
0.1000 mg | ORAL_TABLET | Freq: Every day | ORAL | Status: DC
  Administered 2020-05-01 – 2020-05-05 (×5): 0.1 mg via ORAL
  Filled 2020-05-01 (×6): qty 1

## 2020-05-01 NOTE — H&P (Signed)
History and Physical    [name] YTK:[id number] DOB: [birth year] DOA: 05/01/2020  PCP: Patient, No Pcp Per (Inactive)  Patient coming from: [facility]  I have personally briefly reviewed patient's old medical records in [facility]  Chief Complaint: "They think Iam sick"  HPI: Emma Maldonado is a 70 y.o. female with medical history significant of hypertension, diabetes, renal transplant-on immunosuppressant follows at [facility], dementia brought by EMS to the ED for the evaluation of lower abdominal pain. Patient is poor historian.  History gathered from ED notes.  Apparently patient was given MiraLAX by staff at nursing home yesterday and she had bowel movement however she continued to have lower abdominal pain therefore she brought to ED for further evaluation and management.  Patient denies any symptoms including dysuria, hematuria, change in urinary frequency, foul-smelling urine, back pain, lower abdominal pain, nausea, vomiting, fever or chills.  She is not sure that why she is in the ED.  She also denies shortness of breath, chest pain, recent trauma, wheezing, cough.  She denies smoking, alcohol, illicit drug use.  Of note: Patient admitted last month with toxic encephalopathy in the setting of E. coli UTI with underlying dementia and discharged on 3/17 to SNF on [facility] for 6 more days.  ED Course: Upon arrival to ED: Patient's blood pressure elevated at 185/88, other vital signs stable, maintaining oxygen saturation on room air, afebrile with no leukocytosis, BMP shows potassium of 3.1, lipase: WNL, UA positive for nitrates, leukocytes.  CT abdomen shows right lower quadrant renal transplant with new mild hydronephrosis and focal narrowing of the ureter near the bladder anastomosis possibly representing stricture.  Acute 10th and 11th rib fractures.  ED PA talked to nephrology Dr. Everardo All at [facility] who [facility] and [facility] however they do not have any bed availability at this time.  [name] also discussed with urology on-call Dr. Retta Diones who recommended no urological intervention needed at this time as patient's renal function is stable.  He agreed with continuation of antibiotics.  Prior hospitalist consulted for admission due to UTI and right renal hydronephrosis.  Review of Systems: As per HPI otherwise negative.    Past Medical History:  Diagnosis Date  . Dementia (HCC)   . Diabetes mellitus without complication (HCC)   . Hypertension   . Renal disorder     Past Surgical History:  Procedure Laterality Date  . ABDOMINAL HYSTERECTOMY    . CHOLECYSTECTOMY    . NEPHRECTOMY TRANSPLANTED ORGAN       reports that she has never smoked. She does not have any smokeless tobacco history on file. She reports that she does not drink alcohol and does not use drugs.  No Known Allergies  Family History  Problem Relation Age of Onset  . Diabetes Mellitus II Father     Prior to Admission medications   Medication Sig Start Date End Date Taking? Authorizing Provider  acetaminophen (TYLENOL) 500 MG tablet Take 500 mg by mouth every 6 (six) hours as needed for fever or headache (pain, discomfort).   Yes [provider]  alum & mag hydroxide-simeth (MAALOX/MYLANTA) 200-200-20 MG/5ML suspension Take 30 mLs by mouth every 6 (six) hours as needed for indigestion or heartburn.   Yes [provider]  cholecalciferol (VITAMIN D) 1000 UNITS tablet Take 2,000 Units by mouth daily.   Yes [provider]  diphenoxylate-atropine (LOMOTIL) 2.5-0.025 MG per tablet Take 1 tablet by mouth daily.  Yes [provider]  fludrocortisone (FLORINEF) 0.1 MG tablet Take 0.1 mg by mouth at bedtime.    Yes [provider]  guaifenesin (ROBITUSSIN) 100 MG/5ML syrup Take 200 mg by mouth every 6 (six) hours as needed for cough.   Yes [provider]  insulin aspart (NOVOLOG) 100  UNIT/ML injection Inject 22 Units into the skin 3 (three) times daily with meals.   Yes [provider]  insulin glargine (LANTUS) 100 UNIT/ML injection Inject 10-50 Units into the skin every evening. Inject 10 units in the morning and 50 units at night.   Yes [provider]  levothyroxine (SYNTHROID) 100 MCG tablet Take 100 mcg by mouth daily. 03/04/20  Yes [provider]  loperamide (IMODIUM) 2 MG capsule Take 2 mg by mouth as needed for diarrhea or loose stools. 02/20/20  Yes [provider]  losartan (COZAAR) 25 MG tablet Take 25 mg by mouth daily. 02/12/20  Yes [provider]  magnesium hydroxide (MILK OF MAGNESIA) 400 MG/5ML suspension Take 30 mLs by mouth at bedtime as needed for mild constipation.   Yes [provider]  magnesium oxide (MAG-OX) 400 MG tablet Take 400 mg by mouth daily.   Yes [provider]  memantine (NAMENDA) 10 MG tablet Take 10 mg by mouth every 12 (twelve) hours.   Yes [provider]  neomycin-bacitracin-polymyxin (NEOSPORIN) ointment Apply 1 application topically daily as needed for wound care.   Yes [provider]  predniSONE (DELTASONE) 5 MG tablet Take 5 mg by mouth every morning.   Yes [provider]  QUEtiapine (SEROQUEL) 25 MG tablet Take 25 mg by mouth at bedtime.   Yes [provider]  rosuvastatin (CRESTOR) 5 MG tablet Take 5 mg by mouth daily. 12/01/19  Yes [provider]  sertraline (ZOLOFT) 50 MG tablet Take 125 mg by mouth daily. Taking 2.5 tablets = 125 mg daily 02/12/20  Yes [provider]  tacrolimus (PROGRAF) 0.5 MG capsule Take 1 capsule (0.5 mg total) by mouth daily. 03/28/12  Yes Dhungel, Theda BelfastNishant, MD    Physical Exam: Vitals:   05/01/20 1300 05/01/20 1302 05/01/20 1407 05/01/20 1510  BP: (!) 193/82 (!) 190/80 (!) 164/60 (!) 192/80  Pulse: 75 75 73 84  Resp:  19 18 14   Temp:      TempSrc:  Oral    SpO2: 94% 95% 97% 97%   Weight:        Constitutional: NAD, calm, comfortable, on room air, pleasant Eyes: PERRL, lids and conjunctivae normal ENMT: Mucous membranes are moist. Posterior pharynx clear of any exudate or lesions.Normal dentition.  Neck: normal, supple, no masses, no thyromegaly Respiratory: clear to auscultation bilaterally, no wheezing, no crackles. Normal respiratory effort. No accessory muscle use.  Cardiovascular: Regular rate and rhythm, no murmurs / rubs / gallops. No extremity edema. 2+ pedal pulses. No carotid bruits.  Abdomen: Lower abdominal tenderness positive, no guarding, no rigidity, no masses palpated. No hepatosplenomegaly. Bowel sounds positive.  Musculoskeletal: no clubbing / cyanosis. No joint deformity upper and lower extremities. Good ROM, no contractures. Normal muscle tone.  Skin: no rashes, lesions, ulcers. No induration Neurologic: Alert, oriented to place only, following commands.   Psychiatric: Normal judgment and insight. Alert and oriented x 3. Normal mood.    Labs on Admission: I have personally reviewed following labs and imaging studies  CBC: Recent Labs  Lab 05/01/20 0931  WBC 8.0  NEUTROABS 6.1  HGB 13.0  HCT 41.6  MCV  81.6  PLT 188   Basic Metabolic Panel: Recent Labs  Lab 05/01/20 0931  NA 137  K 3.1*  CL 104  CO2 26  GLUCOSE 358*  BUN 13  CREATININE 0.85  CALCIUM 8.7*   GFR: Estimated Creatinine Clearance: 56.1 mL/min (by C-G formula based on SCr of 0.85 mg/dL). Liver Function Tests: Recent Labs  Lab 05/01/20 0931  AST 17  ALT 16  ALKPHOS 65  BILITOT 0.9  PROT 6.4*  ALBUMIN 3.2*   Recent Labs  Lab 05/01/20 0931  LIPASE 20   No results for input(s): AMMONIA in the last 168 hours. Coagulation Profile: No results for input(s): INR, PROTIME in the last 168 hours. Cardiac Enzymes: No results for input(s): CKTOTAL, CKMB, CKMBINDEX, TROPONINI in the last 168 hours. BNP (last 3 results) No results for input(s): PROBNP in the last  8760 hours. HbA1C: No results for input(s): HGBA1C in the last 72 hours. CBG: No results for input(s): GLUCAP in the last 168 hours. Lipid Profile: No results for input(s): CHOL, HDL, LDLCALC, TRIG, CHOLHDL, LDLDIRECT in the last 72 hours. Thyroid Function Tests: No results for input(s): TSH, T4TOTAL, FREET4, T3FREE, THYROIDAB in the last 72 hours. Anemia Panel: No results for input(s): VITAMINB12, FOLATE, FERRITIN, TIBC, IRON, RETICCTPCT in the last 72 hours. Urine analysis:    Component Value Date/Time   COLORURINE YELLOW 05/01/2020 1144   APPEARANCEUR CLOUDY (A) 05/01/2020 1144   LABSPEC 1.013 05/01/2020 1144   PHURINE 6.0 05/01/2020 1144   GLUCOSEU >=500 (A) 05/01/2020 1144   HGBUR SMALL (A) 05/01/2020 1144   BILIRUBINUR NEGATIVE 05/01/2020 1144   KETONESUR NEGATIVE 05/01/2020 1144   PROTEINUR NEGATIVE 05/01/2020 1144   UROBILINOGEN 1.0 08/14/2012 1022   NITRITE POSITIVE (A) 05/01/2020 1144   LEUKOCYTESUR LARGE (A) 05/01/2020 1144    Radiological Exams on Admission: CT Abdomen Pelvis W Contrast  Result Date: 05/01/2020 CLINICAL DATA:  Left-sided abdominal pain. EXAM: CT ABDOMEN AND PELVIS WITH CONTRAST TECHNIQUE: Multidetector CT imaging of the abdomen and pelvis was performed using the standard protocol following bolus administration of intravenous contrast. CONTRAST:  OMNIPAQUE IOHEXOL 300 MG/ML  SOLN COMPARISON:  CT abdomen pelvis dated May 07, 2019. FINDINGS: Lower chest: No acute abnormality. Unchanged bibasilar atelectasis/scarring. Hepatobiliary: No focal liver abnormality. Status post cholecystectomy. Unchanged moderate intrahepatic biliary and common bile duct dilatation, likely due to reservoir effect. Pancreas: Unchanged atrophy. No ductal dilatation or surrounding inflammatory changes. Spleen: Normal in size without focal abnormality. Adrenals/Urinary Tract: The adrenal glands are unremarkable. Bilateral native kidneys remain atrophic. Right lower quadrant renal  transplant again noted with a few small cysts. New mild hydronephrosis with focal narrowing of the ureter near the bladder anastomosis (series 2, image 78). No calculi. The bladder is unremarkable. Stomach/Bowel: Stomach is within normal limits. Appendix appears normal. No evidence of bowel wall thickening, distention, or inflammatory changes. Left-sided colonic diverticulosis. Vascular/Lymphatic: Aortic atherosclerosis. No enlarged abdominal or pelvic lymph nodes. Reproductive: Status post hysterectomy. No adnexal masses. Other: No free fluid or pneumoperitoneum. Similar chronic scarring in the anterior abdominal wall. Musculoskeletal: Acute left posterior tenth and eleventh rib fractures. Subacute to chronic nondisplaced right posterior eleventh rib fracture, new since April 2021. IMPRESSION: 1. Right lower quadrant renal transplant with new mild hydronephrosis and focal narrowing of the ureter near the bladder anastomosis, possibly representing a stricture. 2. Acute left posterior tenth and eleventh rib fractures. Subacute to chronic nondisplaced right posterior eleventh rib fracture, new since April 2021. 3. Aortic Atherosclerosis (ICD10-I70.0). Electronically Signed  By: Obie Dredge M.D.   On: 05/01/2020 11:22    Assessment/Plan Principal Problem:   UTI (urinary tract infection) Active Problems:   History of renal transplant   Diabetes mellitus (HCC)   Hyperlipidemia   Accelerated hypertension   Hypokalemia   Dementia (HCC)   Hydronephrosis of right kidney   Rib fractures    Right kidney hydronephrosis with UTI and right ureteral stricture: -In the setting of right renal transplant -UA is positive for infection.  Urine culture is pending.  Reviewed CT abdomen/pelvis. -Patient's kidney function is stable, creatinine: 0.85, GFR: More than 60. -Continue Rocephin.  Avoid nephrotoxic medication. -[name] consulted nephrology at [facility] who recommended transfer however no beds are available  at this time. -Urology Dr. Retta Diones- recommended no intervention needed at this time as patient's renal function is stable.  Hypokalemia: -Replenished.  Check magnesium level.  Repeat BMP tomorrow a.m.  Accelerated hypertension: Patient's blood pressure elevated upon arrival -Added hydralazine as needed for blood pressure above 160/100 -Hold losartan at this time.  Type 2 diabetes mellitus: -Check A1c.  Continue same dose of Lantus.  start patient on sliding scale insulin -Monitor blood sugar closely  Rib fractures: -Noted on CT abdomen/pelvis.  Patient is asymptomatic.  She is maintaining oxygen saturation on room air.  No trauma reported -Continue as needed medication.  Continue pulse ox  Dementia: -Continue Namenda, Seroquel and Zoloft  Hyperlipidemia: Continue statin  History of renal transplant: Continue tacrolimus and prednisone  Autonomic orthostatic hypotension Autonomic instability -Reviewed the diagnosis from care everywhere -She is on levothyroxine, fludrocortisone-we will continue same medications  DVT prophylaxis: Lovenox/SCD Code Status: Full code Family Communication: None present at bedside.  Plan of care discussed with patient in length and she verbalized understanding and agreed with it.  I tried to call patient's daughter Gardiner Ramus on [phone] with no response.  Disposition Plan: SNF in 2 to 3 days  consults called: Nephrology at [facility] Urology Admission status: Inpatient   Ollen Bowl MD [facility]  If 7PM-7AM, please contact night-coverage [email or web address]  05/01/2020, 4:01 PM

## 2020-05-01 NOTE — Plan of Care (Signed)
?  Problem: Education: ?Goal: Knowledge of General Education information will improve ?Description: Including pain rating scale, medication(s)/side effects and non-pharmacologic comfort measures ?Outcome: Progressing ?  ?Problem: Clinical Measurements: ?Goal: Will remain free from infection ?Outcome: Progressing ?Goal: Diagnostic test results will improve ?Outcome: Progressing ?  ?Problem: Elimination: ?Goal: Will not experience complications related to bowel motility ?Outcome: Progressing ?Goal: Will not experience complications related to urinary retention ?Outcome: Progressing ?  ?Problem: Pain Managment: ?Goal: General experience of comfort will improve ?Outcome: Progressing ?  ?

## 2020-05-01 NOTE — ED Notes (Signed)
Pt in CT.

## 2020-05-01 NOTE — ED Triage Notes (Signed)
Sent from [facility] unit for 2 days of LLQ pain. Given miralax yesterday by staff with + BM. No n/v.

## 2020-05-01 NOTE — ED Provider Notes (Signed)
[facility]-EMERGENCY DEPT Provider Note   CSN: [id number] Arrival date & time: 05/01/20  5809     History Chief Complaint  Patient presents with  . Abdominal Pain    Emma Maldonado is a 70 y.o. female with PMH/o dementia, DM, HTN BIB EMS from [facility] who presents for evaluation of LLQ abd pain. Patient was given Miralax by staff yesterday and had a bowel movement but was still having pain, prompting EMS call. Patient states no nausea/vomiting.   EM LEVEL 5 CAVEAT DUE TO DEMENTIA.   The history is provided by the patient.       Past Medical History:  Diagnosis Date  . Dementia (HCC)   . Diabetes mellitus without complication (HCC)   . Hypertension   . Renal disorder     Patient Active Problem List   Diagnosis Date Noted  . Hydronephrosis of right kidney 05/01/2020  . Rib fractures 05/01/2020  . UTI (urinary tract infection) 03/25/2020  . Diabetes mellitus type 2, uncomplicated (HCC) 03/25/2020  . Hypokalemia 03/25/2020  . Essential hypertension 03/25/2020  . Dementia (HCC) 03/25/2020  . Altered mental status 03/25/2020  . Uncontrolled type 2 diabetes mellitus with hyperglycemia (HCC) 03/25/2020  . Accelerated hypertension 03/28/2012  . Viral gastroenteritis 03/28/2012  . Sepsis (HCC) 03/26/2012  . History of renal transplant 03/26/2012  . Diabetes mellitus (HCC) 03/26/2012  . Hyperlipidemia 03/26/2012    Past Surgical History:  Procedure Laterality Date  . ABDOMINAL HYSTERECTOMY    . CHOLECYSTECTOMY    . NEPHRECTOMY TRANSPLANTED ORGAN       OB History   No obstetric history on file.     Family History  Problem Relation Age of Onset  . Diabetes Mellitus II Father     Social History   Tobacco Use  . Smoking status: Never Smoker  Substance Use Topics  . Alcohol use: No  . Drug use: No    Home Medications Prior to Admission medications   Medication Sig Start Date End Date Taking? Authorizing  Provider  acetaminophen (TYLENOL) 500 MG tablet Take 500 mg by mouth every 6 (six) hours as needed for fever or headache (pain, discomfort).   Yes [provider]  alum & mag hydroxide-simeth (MAALOX/MYLANTA) 200-200-20 MG/5ML suspension Take 30 mLs by mouth every 6 (six) hours as needed for indigestion or heartburn.   Yes [provider]  cholecalciferol (VITAMIN D) 1000 UNITS tablet Take 2,000 Units by mouth daily.   Yes [provider]  diphenoxylate-atropine (LOMOTIL) 2.5-0.025 MG per tablet Take 1 tablet by mouth daily.   Yes [provider]  fludrocortisone (FLORINEF) 0.1 MG tablet Take 0.1 mg by mouth at bedtime.    Yes [provider]  guaifenesin (ROBITUSSIN) 100 MG/5ML syrup Take 200 mg by mouth every 6 (six) hours as needed for cough.   Yes [provider]  insulin aspart (NOVOLOG) 100 UNIT/ML injection Inject 22 Units into the skin 3 (three) times daily with meals.   Yes [provider]  insulin glargine (LANTUS) 100 UNIT/ML injection Inject 10-50 Units into the skin every evening. Inject 10 units in the morning and 50 units at night.   Yes [provider]  levothyroxine (SYNTHROID) 100 MCG tablet Take 100 mcg by mouth daily. 03/04/20  Yes [provider]  loperamide (IMODIUM) 2 MG capsule Take 2 mg by mouth as needed for diarrhea or loose stools. 02/20/20  Yes [provider]  losartan (COZAAR) 25 MG tablet  Take 25 mg by mouth daily. 02/12/20  Yes [provider]  magnesium hydroxide (MILK OF MAGNESIA) 400 MG/5ML suspension Take 30 mLs by mouth at bedtime as needed for mild constipation.   Yes [provider]  magnesium oxide (MAG-OX) 400 MG tablet Take 400 mg by mouth daily.   Yes [provider]  memantine (NAMENDA) 10 MG tablet Take 10 mg by mouth every 12 (twelve) hours.   Yes [provider]  neomycin-bacitracin-polymyxin (NEOSPORIN) ointment Apply 1 application  topically daily as needed for wound care.   Yes [provider]  predniSONE (DELTASONE) 5 MG tablet Take 5 mg by mouth every morning.   Yes [provider]  QUEtiapine (SEROQUEL) 25 MG tablet Take 25 mg by mouth at bedtime.   Yes [provider]  rosuvastatin (CRESTOR) 5 MG tablet Take 5 mg by mouth daily. 12/01/19  Yes [provider]  sertraline (ZOLOFT) 50 MG tablet Take 125 mg by mouth daily. Taking 2.5 tablets = 125 mg daily 02/12/20  Yes [provider]  tacrolimus (PROGRAF) 0.5 MG capsule Take 1 capsule (0.5 mg total) by mouth daily. 03/28/12  Yes Dhungel, Theda Belfast, MD    Allergies    Patient has no known allergies.  Review of Systems   Review of Systems  Unable to perform ROS: Dementia    Physical Exam Updated Vital Signs BP (!) 171/67   Pulse 72   Temp 98.1 F (36.7 C) (Oral)   Resp 14   Wt 69 kg   SpO2 95%   BMI 27.82 kg/m   Physical Exam Vitals and nursing note reviewed.  Constitutional:      Appearance: Normal appearance. She is well-developed.  HENT:     Head: Normocephalic and atraumatic.  Eyes:     General: Lids are normal.     Conjunctiva/sclera: Conjunctivae normal.     Pupils: Pupils are equal, round, and reactive to light.  Cardiovascular:     Rate and Rhythm: Normal rate and regular rhythm.     Pulses: Normal pulses.     Heart sounds: Normal heart sounds. No murmur heard. No friction rub. No gallop.   Pulmonary:     Effort: Pulmonary effort is normal.     Breath sounds: Normal breath sounds.     Comments: Lungs clear to auscultation bilaterally.  Symmetric chest rise.  No wheezing, rales, rhonchi. Abdominal:     Palpations: Abdomen is soft. Abdomen is not rigid.     Tenderness: There is abdominal tenderness in the right lower quadrant, suprapubic area, left upper quadrant and left lower quadrant. There is no guarding.     Comments: Abdomen soft, nondistended.  Tenderness palpation noted to the left side of  the abdomen, both diffusely to the upper and lower quadrant.  Tenderness is well noted to suprapubic and right lower quadrant region.  No rigidity, guarding.  No CVA tenderness noted bilaterally.  She has a small area of erythema noted to the umbilicus.  No surrounding warmth, drainage.  Musculoskeletal:        General: Normal range of motion.     Cervical back: Full passive range of motion without pain.  Skin:    General: Skin is warm and dry.     Capillary Refill: Capillary refill takes less than 2 seconds.  Neurological:     Mental Status: She is alert and oriented to person, place, and time.  Psychiatric:        Speech: Speech normal.  ED Results / Procedures / Treatments   Labs (all labs ordered are listed, but only abnormal results are displayed) Labs Reviewed  COMPREHENSIVE METABOLIC PANEL - Abnormal; Notable for the following components:      Result Value   Potassium 3.1 (*)    Glucose, Bld 358 (*)    Calcium 8.7 (*)    Total Protein 6.4 (*)    Albumin 3.2 (*)    All other components within normal limits  CBC WITH DIFFERENTIAL/PLATELET - Abnormal; Notable for the following components:   MCH 25.5 (*)    All other components within normal limits  URINALYSIS, ROUTINE W REFLEX MICROSCOPIC - Abnormal; Notable for the following components:   APPearance CLOUDY (*)    Glucose, UA >=500 (*)    Hgb urine dipstick SMALL (*)    Nitrite POSITIVE (*)    Leukocytes,Ua LARGE (*)    WBC, UA >50 (*)    Bacteria, UA MANY (*)    All other components within normal limits  URINE CULTURE  LIPASE, BLOOD  CBC  CREATININE, SERUM  MAGNESIUM    EKG None  Radiology CT Abdomen Pelvis W Contrast  Result Date: 05/01/2020 CLINICAL DATA:  Left-sided abdominal pain. EXAM: CT ABDOMEN AND PELVIS WITH CONTRAST TECHNIQUE: Multidetector CT imaging of the abdomen and pelvis was performed using the standard protocol following bolus administration of intravenous contrast. CONTRAST:   OMNIPAQUE IOHEXOL 300 MG/ML  SOLN COMPARISON:  CT abdomen pelvis dated May 07, 2019. FINDINGS: Lower chest: No acute abnormality. Unchanged bibasilar atelectasis/scarring. Hepatobiliary: No focal liver abnormality. Status post cholecystectomy. Unchanged moderate intrahepatic biliary and common bile duct dilatation, likely due to reservoir effect. Pancreas: Unchanged atrophy. No ductal dilatation or surrounding inflammatory changes. Spleen: Normal in size without focal abnormality. Adrenals/Urinary Tract: The adrenal glands are unremarkable. Bilateral native kidneys remain atrophic. Right lower quadrant renal transplant again noted with a few small cysts. New mild hydronephrosis with focal narrowing of the ureter near the bladder anastomosis (series 2, image 78). No calculi. The bladder is unremarkable. Stomach/Bowel: Stomach is within normal limits. Appendix appears normal. No evidence of bowel wall thickening, distention, or inflammatory changes. Left-sided colonic diverticulosis. Vascular/Lymphatic: Aortic atherosclerosis. No enlarged abdominal or pelvic lymph nodes. Reproductive: Status post hysterectomy. No adnexal masses. Other: No free fluid or pneumoperitoneum. Similar chronic scarring in the anterior abdominal wall. Musculoskeletal: Acute left posterior tenth and eleventh rib fractures. Subacute to chronic nondisplaced right posterior eleventh rib fracture, new since April 2021. IMPRESSION: 1. Right lower quadrant renal transplant with new mild hydronephrosis and focal narrowing of the ureter near the bladder anastomosis, possibly representing a stricture. 2. Acute left posterior tenth and eleventh rib fractures. Subacute to chronic nondisplaced right posterior eleventh rib fracture, new since April 2021. 3. Aortic Atherosclerosis (ICD10-I70.0). Electronically Signed   By: Obie Dredge M.D.   On: 05/01/2020 11:22    Procedures Procedures   Medications Ordered in ED Medications  enoxaparin  (LOVENOX) injection 40 mg (has no administration in time range)  acetaminophen (TYLENOL) tablet 650 mg (has no administration in time range)    Or  acetaminophen (TYLENOL) suppository 650 mg (has no administration in time range)  oxyCODONE (Oxy IR/ROXICODONE) immediate release tablet 5 mg (has no administration in time range)  HYDROmorphone (DILAUDID) injection 0.5-1 mg (has no administration in time range)  ondansetron (ZOFRAN) tablet 4 mg (has no administration in time range)    Or  ondansetron (ZOFRAN) injection 4 mg (has no administration in time range)  cefTRIAXone (  ROCEPHIN) 1 g in sodium chloride 0.9 % 100 mL IVPB (has no administration in time range)  potassium chloride (KLOR-CON) packet 40 mEq (has no administration in time range)  ondansetron (ZOFRAN) injection 4 mg (4 mg Intravenous Given 05/01/20 0936)  morphine 2 MG/ML injection 2 mg (2 mg Intravenous Given 05/01/20 0936)  iohexol (OMNIPAQUE) 300 MG/ML solution 100 mL (100 mLs Intravenous Contrast Given 05/01/20 1032)  cefTRIAXone (ROCEPHIN) 1 g in sodium chloride 0.9 % 100 mL IVPB (0 g Intravenous Stopped 05/01/20 1343)    ED Course  I have reviewed the triage vital signs and the nursing notes.  Pertinent labs & imaging results that were available during my care of the patient were reviewed by me and considered in my medical decision making (see chart for details).    MDM Rules/Calculators/A&P                          70 y.o. F with PMH/o Dementia, DM who presents for evaluation of abdominal pain. Patient given Miralax and had a BM but still having pain. No nausea/vomiting. Patient is afebrile, non-toxic appearing, sitting comfortably on examination table. Vital signs reviewed and stable.  On exam, she has tenderness noted to the LLQ, LUQ and RLQ  10:28 AM: Discussed with [facility] RN.   Pain yesterday worse this AM  No blood in the stools no n/aseua/vomign no fevers  No troble eating and drinking    Lipase is  normal. CBC shows no leukocytosis. CMP shows potassium of 3.1, Glucose of 358. BUN and Cr within normal limits.  UA shows small hgb, positive nitrites, large leukocytes, pyuria. Rocephin given. Urine culture sent.   CT scan shows right lower quadrant renal transplant with new mild hydronephrosis and focal narrowing of the ureter near the bladder anastomosis, possibly representing a stricture.  There is also mentions of acute left posterior 10th and 11th rib fractures.  Discussed patient with Dr. Everardo [facility]ison [facility]) regarding patient's findings on CT scan today as well as UTI. He [facility] Requests hospitalist admission. No further intervention needed.   2:06 PM: Discussed with the transfer RN.  They do not have any availability at this time.  She will discuss with weight management and coming back.  Discussed with [facility] nurse.  They at this time cannot accept patient for admission given that they do not but capacity.  They discussed with nephrology who recommended that patient be admitted to our facility.   Discussed patient with Dr. Alma Friendlyalhstedt (Urology) who reviewed patient's work-up here in the ED.  No emergent urological intervention needed.  Discussed patient with Dr. Jacqulyn BathPahwani (hospitalist) who accepts patient for admission.   Portions of this note were generated with Scientist, clinical (histocompatibility and immunogenetics)Dragon dictation software. Dictation errors may occur despite best attempts at proofreading.  Final Clinical Impression(s) / ED Diagnoses Final diagnoses:  Acute cystitis with hematuria  Hydronephrosis, unspecified hydronephrosis type    Rx / DC Orders ED Discharge Orders    None       Rosana HoesLayden, Tarun Patchell A, PA-C 05/01/20 1627    Cheryll CockayneHong, Joshua S, MD 05/03/20 [phone]

## 2020-05-01 NOTE — ED Notes (Signed)
Notified lab of the urine culture add on.

## 2020-05-02 ENCOUNTER — Encounter (HOSPITAL_COMMUNITY): Payer: Self-pay | Admitting: Internal Medicine

## 2020-05-02 LAB — CBC
HCT: 39.1 % (ref 36.0–46.0)
Hemoglobin: 12.1 g/dL (ref 12.0–15.0)
MCH: 25.6 pg — ABNORMAL LOW (ref 26.0–34.0)
MCHC: 30.9 g/dL (ref 30.0–36.0)
MCV: 82.7 fL (ref 80.0–100.0)
Platelets: 177 10*3/uL (ref 150–400)
RBC: 4.73 MIL/uL (ref 3.87–5.11)
RDW: 14.8 % (ref 11.5–15.5)
WBC: 8.2 10*3/uL (ref 4.0–10.5)
nRBC: 0 % (ref 0.0–0.2)

## 2020-05-02 LAB — GLUCOSE, CAPILLARY
Glucose-Capillary: 175 mg/dL — ABNORMAL HIGH (ref 70–99)
Glucose-Capillary: 63 mg/dL — ABNORMAL LOW (ref 70–99)
Glucose-Capillary: 226 mg/dL — ABNORMAL HIGH (ref 70–99)
Glucose-Capillary: 260 mg/dL — ABNORMAL HIGH (ref 70–99)
Glucose-Capillary: 225 mg/dL — ABNORMAL HIGH (ref 70–99)

## 2020-05-02 LAB — COMPREHENSIVE METABOLIC PANEL
ALT: 39 U/L (ref 0–44)
AST: 97 U/L — ABNORMAL HIGH (ref 15–41)
Albumin: 3.1 g/dL — ABNORMAL LOW (ref 3.5–5.0)
Alkaline Phosphatase: 114 U/L (ref 38–126)
Anion gap: 7 (ref 5–15)
BUN: 13 mg/dL (ref 8–23)
CO2: 29 mmol/L (ref 22–32)
Calcium: 8.8 mg/dL — ABNORMAL LOW (ref 8.9–10.3)
Chloride: 109 mmol/L (ref 98–111)
Creatinine, Ser: 0.88 mg/dL (ref 0.44–1.00)
GFR, Estimated: >60 mL/min (ref >60)
Glucose, Bld: 79 mg/dL (ref 70–99)
Potassium: 3.8 mmol/L (ref 3.5–5.1)
Sodium: 145 mmol/L (ref 135–145)
Total Bilirubin: 1.1 mg/dL (ref 0.3–1.2)
Total Protein: 5.9 g/dL — ABNORMAL LOW (ref 6.5–8.1)

## 2020-05-02 LAB — SARS CORONAVIRUS 2 (TAT 6-24 HRS): SARS Coronavirus 2: NEGATIVE

## 2020-05-02 MED ORDER — ADULT MULTIVITAMIN W/MINERALS CH
1.0000 | ORAL_TABLET | Freq: Every day | ORAL | Status: DC
  Administered 2020-05-02 – 2020-05-06 (×5): 1 via ORAL
  Filled 2020-05-02 (×5): qty 1

## 2020-05-02 MED ORDER — SODIUM CHLORIDE 0.9 % IV SOLN
1.0000 g | INTRAVENOUS | Status: DC
  Administered 2020-05-03 – 2020-05-06 (×4): 1 g via INTRAVENOUS
  Filled 2020-05-02 (×2): qty 1
  Filled 2020-05-02: qty 10
  Filled 2020-05-02: qty 1

## 2020-05-02 NOTE — Plan of Care (Signed)
  Problem: Activity: Goal: Risk for activity intolerance will decrease Outcome: Progressing   Problem: Nutrition: Goal: Adequate nutrition will be maintained Outcome: Progressing   

## 2020-05-02 NOTE — Progress Notes (Signed)
Inpatient Diabetes Program Recommendations  AACE/ADA: New Consensus Statement on Inpatient Glycemic Control (2015)  Target Ranges:  Prepandial:   less than 140 mg/dL      Peak postprandial:   less than 180 mg/dL (1-2 hours)      Critically ill patients:  140 - 180 mg/dL   Lab Results  Component Value Date   GLUCAP 226 (H) 05/02/2020   HGBA1C 9.9 (H) 03/25/2020    Review of Glycemic Control Results for LORENZO, PEREYRA (MRN [id number]) as of 05/02/2020 14:06  Ref. Range 05/01/2020 20:24 05/01/2020 21:56 05/02/2020 10:09 05/02/2020 11:58  Glucose-Capillary Latest Ref Range: 70 - 99 mg/dL 321 (H) 224 (H) 63 (L) 226 (H)   Diabetes history: DM 2 Outpatient Diabetes medications:  Lantus 10 units q AM and Lantus 50 units q HS Novolog 22 units tid with meals  Prednisone 5 mg with breakfast Current orders for Inpatient glycemic control:  Lantus 10 units q AM and Lantus 50 units q HS Novolog moderate tid with meals and HS Inpatient Diabetes Program Recommendations:   Please consider reducing Lantus to 30 units q HS and stop morning Lantus.    Thanks, Beryl Meager, RN, BC-ADM Inpatient Diabetes Coordinator Pager [phone] (8a-5p)

## 2020-05-02 NOTE — Progress Notes (Signed)
Initial Nutrition Assessment  DOCUMENTATION CODES:   Not applicable  INTERVENTION:  - continue Ensure Enlive BID, each supplement provides 350 kcal and 20 grams of protein. - will order 1 tablet multivitamin with minerals/day.  NUTRITION DIAGNOSIS:   Increased nutrient needs related to acute illness as evidenced by estimated needs.  GOAL:   Patient will meet greater than or equal to 90% of their needs  MONITOR:   PO intake,Supplement acceptance,Labs,Weight trends  REASON FOR ASSESSMENT:   Malnutrition Screening Tool  ASSESSMENT:   70 y.o. female with medical history of HTN, DM, renal transplant-on immunosuppressant follows at [facility], and dementia. She presented to the ED via EMS for evaluation of lower abdominal pain. She is a poor historian. Patient was admitted a month ago with toxic encephalopathy in the setting of UTI with underlying dementia. She was discharged to SNF on 03/28/20.  Patient laying in bed with no family or visitors present. Patient was unable to provide any pertinent information. She is unsure if she is R-handed or L-handed and unsure if she has been out of the bed today.   Able to talk with tech who reports patient ate 100% of a late breakfast, refused lunch d/t late breakfast. Patient was able to feed herself.   Weight today is documented as 108 lb; question accuracy of this based on visualization of patient and NFPE findings.   Weight on 3/15 was 152 lb and weight on 09/13/19 was 153 lb.   Per notes: - R kidney hydronephrosis with UTI and R ureteral stricture - type 2 DM pending HgbA1c - rib fractures - hx of dementia   Labs reviewed; CBGs: 63 and 226 mg/dl, AST elevated.  Medications reviewed; sliding scale novolog, 10 units lantus/day, 50 units lantus/night, 50 mcg oral synthroid/day, 40 mEq Klor-Con x2 doses 4/20, 5 mg deltasone/day.     NUTRITION - FOCUSED PHYSICAL EXAM:  completed; no muscle depletions or fat depletions.   Diet Order:    Diet Order            Diet regular Room service appropriate? Yes; Fluid consistency: Thin  Diet effective now                 EDUCATION NEEDS:   Not appropriate for education at this time  Skin:  Skin Assessment: Skin Integrity Issues: Skin Integrity Issues:: DTI DTI: L ankle  Last BM:  4/19  Height:   Ht Readings from Last 1 Encounters:  05/01/20 5\' 2"  (1.575 m)    Weight:   Wt Readings from Last 1 Encounters:  05/02/20 49.1 kg     Estimated Nutritional Needs:  Kcal:  1475-1650 kcal Protein:  70-80 grams Fluid:  >/= 1.8 L/day      05/04/20, MS, RD, LDN, CNSC Inpatient Clinical Dietitian RD pager # available in [facility]  After hours/weekend pager # available in [facility]

## 2020-05-02 NOTE — Progress Notes (Signed)
PROGRESS NOTE    Breya Cass  SKA:[id number] DOB: 01/02/1951 DOA: 05/01/2020 PCP: Patient, No Pcp Per (Inactive)   Brief Narrative:  This 70 years old female with PMH significant for hypertension, diabetes, renal transplant on immunosuppressants follows up with nephrologist at [facility], dementia presents in the ED for the evaluation of lower abdominal pain. Patient is poor historian , history is obtained from ED notes.  Patient was given MiraLAX by nursing home staff despite having bowel movement,  Patient continued to complain of lower abdominal pain.  Patient denies any urinary symptoms, nausea, vomiting, fever.  In the ED her blood pressure was 185/ 88 other vitals were stable.  CT abdomen shows right lower quadrant renal transplant with new mild hydronephrosis and focal narrowing of the ureter near the bladder anastomosis possibly representing a stricture.  There is acute 10th and 11th ribs fractures. ED PA has spoken with nephrologist Dr. Revonda Standard at [facility] who recommended transfer to [facility] however they do not have any bed availability at this time.  Urologist was consulted recommended no urological intervention needed at this point as patient's renal functions are stable.  Patient is admitted for UTI and right renal hydronephrosis.  Assessment & Plan:   Principal Problem:   UTI (urinary tract infection) Active Problems:   History of renal transplant   Diabetes mellitus (HCC)   Hyperlipidemia   Accelerated hypertension   Hypokalemia   Dementia (HCC)   Hydronephrosis of right kidney   Rib fractures  Right kidney hydronephrosis with UTI and right ureteral stricture: -Patient presented with lower abdominal pain in the setting of right renal transplant -UA is positive for infection.  Urine culture is pending.  Reviewed CT abdomen/pelvis. -Patient's kidney functions are stable, creatinine: 0.85, GFR: More than 60. -Continue Rocephin.  Avoid nephrotoxic  medication. -[name] consulted nephrology at [facility] who recommended transfer however no beds are available at this time. -Urology Dr. Retta Diones- recommended no intervention needed at this time as patient's renal function is stable.  UTI: Continue Rocephin, follow-up urine cultures.  Hypokalemia:> Resolved. -Replenished.    Accelerated hypertension: Patient's blood pressure elevated upon arrival Continue hydralazine as needed for blood pressure above 160/100 -Hold losartan at this time.  Type 2 diabetes mellitus: -Check A1c.  Continue same dose of Lantus.  Continue sliding scale. -Monitor blood sugar closely  Rib fractures: Noted on CT abdomen/pelvis.  Patient is asymptomatic.   She is maintaining oxygen saturation on room air.  No trauma reported Continue as needed Pain medication.  Continue pulse ox  Dementia: Continue Namenda, Seroquel and Zoloft  Hyperlipidemia: Continue statin  History of renal transplant: Continue tacrolimus and prednisone.  Autonomic orthostatic hypotension Autonomic instability She is on levothyroxine, fludrocortisone- we will continue same medications    DVT prophylaxis:  Lovenox. Code Status: Full code. Family Communication: No Family at bedside.   Disposition Plan:   Status is: Inpatient  Remains inpatient appropriate because:Inpatient level of care appropriate due to severity of illness   Dispo: The patient is from: Home              Anticipated d/c is to: SNF              Patient currently is not medically stable to d/c.   Difficult to place patient No    Consultants:   Non formal urology consult  Procedures:  Antimicrobials:   Anti-infectives (From admission, onward)   Start     Dose/Rate Route Frequency Ordered Stop  05/02/20 1300  cefTRIAXone (ROCEPHIN) 1 g in sodium chloride 0.9 % 100 mL IVPB        1 g 200 mL/hr over 30 Minutes Intravenous  Once 05/01/20 1559 05/02/20 1314   05/01/20 1315  cefTRIAXone  (ROCEPHIN) 1 g in sodium chloride 0.9 % 100 mL IVPB        1 g 200 mL/hr over 30 Minutes Intravenous  Once 05/01/20 1300 05/01/20 1343     Subjective: Patient was seen and examined at bedside.  Overnight events noted.  Patient appears comfortable,  denies any urinary symptoms.  Denies any abdominal pain.  Objective: Vitals:   05/01/20 2020 05/01/20 2200 05/02/20 0500 05/02/20 0656  BP: (!) 157/70 138/75  (!) 152/75  Pulse: 79 72  69  Resp: 19 18  20   Temp: 98.8 F (37.1 C) 98.8 F (37.1 C)  97.9 F (36.6 C)  TempSrc: Oral Oral  Oral  SpO2: 95% 94%  90%  Weight:   49.1 kg   Height:       No intake or output data in the 24 hours ending 05/02/20 1445 Filed Weights   05/01/20 0922 05/01/20 1715 05/02/20 0500  Weight: 69 kg 69 kg 49.1 kg    Examination:  General exam: Appears calm and comfortable, not in any acute distress Respiratory system: Clear to auscultation. Respiratory effort normal. Cardiovascular system: S1 & S2 heard, RRR. No JVD, murmurs, rubs, gallops or clicks. No pedal edema. Gastrointestinal system: Abdomen is nondistended, soft and nontender. No organomegaly or masses felt. Normal bowel sounds heard. Central nervous system: Alert and oriented. No focal neurological deficits. Extremities: Symmetric 5 x 5 power.  No edema, no cyanosis, no clubbing. Skin: No rashes, lesions or ulcers Psychiatry: Judgement and insight appear normal. Mood & affect appropriate.     Data Reviewed: I have personally reviewed following labs and imaging studies  CBC: Recent Labs  Lab 05/01/20 0931 05/01/20 1732 05/02/20 0408  WBC 8.0 9.5 8.2  NEUTROABS 6.1  --   --   HGB 13.0 14.6 12.1  HCT 41.6 46.3* 39.1  MCV 81.6 81.5 82.7  PLT 188 210 177   Basic Metabolic Panel: Recent Labs  Lab 05/01/20 0931 05/01/20 1732 05/02/20 0408  NA 137  --  145  K 3.1*  --  3.8  CL 104  --  109  CO2 26  --  29  GLUCOSE 358*  --  79  BUN 13  --  13  CREATININE 0.85 0.79 0.88   CALCIUM 8.7*  --  8.8*  MG  --  1.8  --    GFR: Estimated Creatinine Clearance: 46.1 mL/min (by C-G formula based on SCr of 0.88 mg/dL). Liver Function Tests: Recent Labs  Lab 05/01/20 0931 05/02/20 0408  AST 17 97*  ALT 16 39  ALKPHOS 65 114  BILITOT 0.9 1.1  PROT 6.4* 5.9*  ALBUMIN 3.2* 3.1*   Recent Labs  Lab 05/01/20 0931  LIPASE 20   No results for input(s): AMMONIA in the last 168 hours. Coagulation Profile: No results for input(s): INR, PROTIME in the last 168 hours. Cardiac Enzymes: No results for input(s): CKTOTAL, CKMB, CKMBINDEX, TROPONINI in the last 168 hours. BNP (last 3 results) No results for input(s): PROBNP in the last 8760 hours. HbA1C: No results for input(s): HGBA1C in the last 72 hours. CBG: Recent Labs  Lab 05/01/20 2024 05/01/20 2156 05/02/20 1009 05/02/20 1158  GLUCAP 121* 110* 63* 226*   Lipid Profile: No results  for input(s): CHOL, HDL, LDLCALC, TRIG, CHOLHDL, LDLDIRECT in the last 72 hours. Thyroid Function Tests: Recent Labs    05/01/20 1732  TSH 2.396   Anemia Panel: No results for input(s): VITAMINB12, FOLATE, FERRITIN, TIBC, IRON, RETICCTPCT in the last 72 hours. Sepsis Labs: No results for input(s): PROCALCITON, LATICACIDVEN in the last 168 hours.  Recent Results (from the past 240 hour(s))  Urine culture     Status: None (Preliminary result)   Collection Time: 05/01/20  1:03 PM   Specimen: Urine, Random  Result Value Ref Range Status   Specimen Description   Final    URINE, RANDOM Performed at [facility], [address] [place], [place] [address]    Special Requests   Final    NONE Performed at [facility], [address], [place], [place] [address]    Culture PENDING  Incomplete   Report Status PENDING  Incomplete  SARS CORONAVIRUS 2 (TAT 6-24 HRS) Nasopharyngeal Nasopharyngeal Swab     Status: None   Collection Time: 05/01/20  6:40 PM   Specimen: Nasopharyngeal Swab  Result Value  Ref Range Status   SARS Coronavirus 2 NEGATIVE NEGATIVE Final    Comment: (NOTE) SARS-CoV-2 target nucleic acids are NOT DETECTED.  The SARS-CoV-2 RNA is generally detectable in upper and lower respiratory specimens during the acute phase of infection. Negative results do not preclude SARS-CoV-2 infection, do not rule out co-infections with other pathogens, and should not be used as the sole basis for treatment or other patient management decisions. Negative results must be combined with clinical observations, patient history, and epidemiological information. The expected result is Negative.  Fact Sheet for Patients: HairSlick.no  Fact Sheet for Healthcare Providers: quierodirigir.com  This test is not yet approved or cleared by the [place] FDA and  has been authorized for detection and/or diagnosis of SARS-CoV-2 by FDA under an Emergency Use Authorization (EUA). This EUA will remain  in effect (meaning this test can be used) for the duration of the COVID-19 declaration under Se ction 564(b)(1) of the Act, 21 U.S.C. section 360bbb-3(b)(1), unless the authorization is terminated or revoked sooner.  Performed at [facility], [address] [place], [place] [address]   MRSA PCR Screening     Status: None   Collection Time: 05/01/20  6:40 PM   Specimen: Nasopharyngeal  Result Value Ref Range Status   MRSA by PCR NEGATIVE NEGATIVE Final    Comment:        The GeneXpert MRSA Assay (FDA approved for NASAL specimens only), is one component of a comprehensive MRSA colonization surveillance program. It is not intended to diagnose MRSA infection nor to guide or monitor treatment for MRSA infections. Performed at [facility], [address], [place], [place] [address]      Radiology Studies: CT Abdomen Pelvis W Contrast  Result Date: 05/01/2020 CLINICAL DATA:  Left-sided abdominal pain.  EXAM: CT ABDOMEN AND PELVIS WITH CONTRAST TECHNIQUE: Multidetector CT imaging of the abdomen and pelvis was performed using the standard protocol following bolus administration of intravenous contrast. CONTRAST:  OMNIPAQUE IOHEXOL 300 MG/ML  SOLN COMPARISON:  CT abdomen pelvis dated May 07, 2019. FINDINGS: Lower chest: No acute abnormality. Unchanged bibasilar atelectasis/scarring. Hepatobiliary: No focal liver abnormality. Status post cholecystectomy. Unchanged moderate intrahepatic biliary and common bile duct dilatation, likely due to reservoir effect. Pancreas: Unchanged atrophy. No ductal dilatation or surrounding inflammatory changes. Spleen: Normal in size without focal abnormality. Adrenals/Urinary Tract:  The adrenal glands are unremarkable. Bilateral native kidneys remain atrophic. Right lower quadrant renal transplant again noted with a few small cysts. New mild hydronephrosis with focal narrowing of the ureter near the bladder anastomosis (series 2, image 78). No calculi. The bladder is unremarkable. Stomach/Bowel: Stomach is within normal limits. Appendix appears normal. No evidence of bowel wall thickening, distention, or inflammatory changes. Left-sided colonic diverticulosis. Vascular/Lymphatic: Aortic atherosclerosis. No enlarged abdominal or pelvic lymph nodes. Reproductive: Status post hysterectomy. No adnexal masses. Other: No free fluid or pneumoperitoneum. Similar chronic scarring in the anterior abdominal wall. Musculoskeletal: Acute left posterior tenth and eleventh rib fractures. Subacute to chronic nondisplaced right posterior eleventh rib fracture, new since April 2021. IMPRESSION: 1. Right lower quadrant renal transplant with new mild hydronephrosis and focal narrowing of the ureter near the bladder anastomosis, possibly representing a stricture. 2. Acute left posterior tenth and eleventh rib fractures. Subacute to chronic nondisplaced right posterior eleventh rib fracture, new  since April 2021. 3. Aortic Atherosclerosis (ICD10-I70.0). Electronically Signed   By: Obie Dredge M.D.   On: 05/01/2020 11:22   Scheduled Meds: . enoxaparin (LOVENOX) injection  40 mg Subcutaneous Q24H  . feeding supplement  237 mL Oral BID BM  . fludrocortisone  0.1 mg Oral QHS  . insulin aspart  0-15 Units Subcutaneous TID WC  . insulin aspart  0-5 Units Subcutaneous QHS  . insulin glargine  10 Units Subcutaneous Daily  . insulin glargine  50 Units Subcutaneous QHS  . levothyroxine  100 mcg Oral Daily  . memantine  10 mg Oral Q12H  . multivitamin with minerals  1 tablet Oral Daily  . predniSONE  5 mg Oral Q breakfast  . QUEtiapine  25 mg Oral QHS  . rosuvastatin  5 mg Oral Daily  . sertraline  125 mg Oral Daily  . tacrolimus  0.5 mg Oral Daily   Continuous Infusions:   LOS: 1 day    Time spent: 35 mins.    Cipriano Bunker, MD [facility]   If 7PM-7AM, please contact night-coverage

## 2020-05-03 LAB — CBC
HCT: 40.6 % (ref 36.0–46.0)
Hemoglobin: 12.8 g/dL (ref 12.0–15.0)
MCH: 25.8 pg — ABNORMAL LOW (ref 26.0–34.0)
MCHC: 31.5 g/dL (ref 30.0–36.0)
MCV: 81.7 fL (ref 80.0–100.0)
Platelets: 206 10*3/uL (ref 150–400)
RBC: 4.97 MIL/uL (ref 3.87–5.11)
RDW: 14.4 % (ref 11.5–15.5)
WBC: 7.8 10*3/uL (ref 4.0–10.5)
nRBC: 0 % (ref 0.0–0.2)

## 2020-05-03 LAB — BASIC METABOLIC PANEL
Anion gap: 9 (ref 5–15)
BUN: 12 mg/dL (ref 8–23)
CO2: 26 mmol/L (ref 22–32)
Calcium: 9.4 mg/dL (ref 8.9–10.3)
Chloride: 107 mmol/L (ref 98–111)
Creatinine, Ser: 0.65 mg/dL (ref 0.44–1.00)
GFR, Estimated: >60 mL/min (ref >60)
Glucose, Bld: 63 mg/dL — ABNORMAL LOW (ref 70–99)
Potassium: 3.9 mmol/L (ref 3.5–5.1)
Sodium: 142 mmol/L (ref 135–145)

## 2020-05-03 LAB — HEMOGLOBIN A1C
Hgb A1c MFr Bld: 9.7 % — ABNORMAL HIGH (ref 4.8–5.6)
Mean Plasma Glucose: 231.69 mg/dL

## 2020-05-03 LAB — GLUCOSE, CAPILLARY
Glucose-Capillary: 130 mg/dL — ABNORMAL HIGH (ref 70–99)
Glucose-Capillary: 356 mg/dL — ABNORMAL HIGH (ref 70–99)
Glucose-Capillary: 284 mg/dL — ABNORMAL HIGH (ref 70–99)
Glucose-Capillary: 195 mg/dL — ABNORMAL HIGH (ref 70–99)

## 2020-05-03 LAB — MAGNESIUM: Magnesium: 1.6 mg/dL — ABNORMAL LOW (ref 1.7–2.4)

## 2020-05-03 LAB — PHOSPHORUS: Phosphorus: 4.4 mg/dL (ref 2.5–4.6)

## 2020-05-03 MED ORDER — INSULIN GLARGINE 100 UNIT/ML ~~LOC~~ SOLN
30.0000 [IU] | Freq: Every day | SUBCUTANEOUS | Status: DC
  Administered 2020-05-03 – 2020-05-05 (×3): 30 [IU] via SUBCUTANEOUS
  Filled 2020-05-03 (×4): qty 0.3

## 2020-05-03 MED ORDER — INSULIN ASPART 100 UNIT/ML ~~LOC~~ SOLN
3.0000 [IU] | Freq: Three times a day (TID) | SUBCUTANEOUS | Status: DC
  Administered 2020-05-03 – 2020-05-06 (×10): 3 [IU] via SUBCUTANEOUS

## 2020-05-03 MED ORDER — MAGNESIUM SULFATE 2 GM/50ML IV SOLN
2.0000 g | Freq: Once | INTRAVENOUS | Status: AC
  Administered 2020-05-03: 2 g via INTRAVENOUS
  Filled 2020-05-03: qty 50

## 2020-05-03 NOTE — Progress Notes (Signed)
Inpatient Diabetes Program Recommendations  AACE/ADA: New Consensus Statement on Inpatient Glycemic Control (2015)  Target Ranges:  Prepandial:   less than 140 mg/dL      Peak postprandial:   less than 180 mg/dL (1-2 hours)      Critically ill patients:  140 - 180 mg/dL   Lab Results  Component Value Date   GLUCAP 130 (H) 05/03/2020   HGBA1C 9.7 (H) 05/03/2020    Review of Glycemic Control Results for KADIJAH, SHAMOON (MRN [id number]) as of 05/03/2020 10:52  Ref. Range 05/03/2020 03:48  Glucose Latest Ref Range: 70 - 99 mg/dL 63 (L)   Diabetes history: DM 2 Outpatient Diabetes medications:  Novolog 22 units tid with meals, Lantus 10 units in the AM and 50 units in the PM Current orders for Inpatient glycemic control:  Novolog moderate tid with meals and HS Lantus 10 units q AM and Lantus 50 units q PM  Inpatient Diabetes Program Recommendations:   Please reduce PM dose of Lantus to 30 units q PM and add Novolog 3 units tid with meals (hold if patient NPO or eats less than 50%).   Thanks, Beryl Meager, RN, BC-ADM Inpatient Diabetes Coordinator Pager [phone] (8a-5p)

## 2020-05-03 NOTE — TOC Progression Note (Signed)
Transition of Care [facility]) - Progression Note    Patient Details  Name: Emma Maldonado MRN: [id number] Date of Birth: 05/13/1950  Transition of Care [facility]) CM/SW Contact  Geni Bers, RN Phone Number: 05/03/2020, 4:16 PM  Clinical Narrative:     Pt is from [facility].   Expected Discharge Plan: Assisted Living [facility]) Barriers to Discharge: No Barriers Identified  Expected Discharge Plan and Services Expected Discharge Plan: Assisted Living [facility])       Living arrangements for the past 2 months: Assisted Living Facility [facility])                                       Social Determinants of Health (SDOH) Interventions    Readmission Risk Interventions No flowsheet data found.

## 2020-05-03 NOTE — Progress Notes (Signed)
PROGRESS NOTE    Talbot Grumblingverarda Carmen Strauser  ZOX:[id number] DOB: 11/14/1950 DOA: 05/01/2020   PCP: Patient, No Pcp Per (Inactive)   Brief Narrative:  This 70 years old female with PMH significant for hypertension, diabetes, renal transplant on immunosuppressants follows up with nephrologist [facility], dementia presents in the ED for the evaluation of lower abdominal pain. Patient is poor historian , history is obtained from ED notes.  Patient was given MiraLAX by nursing home staff despite having bowel movement,  Patient continued to complain of lower abdominal pain.  Patient denies any urinary symptoms, nausea, vomiting, fever.  In the ED her blood pressure was 185/ 88 other vitals were stable.  CT abdomen shows right lower quadrant renal transplant with new mild hydronephrosis and focal narrowing of the ureter near the bladder anastomosis possibly representing a stricture. There is acute left 10th and 11th ribs fractures. ED PA has spoken with nephrologist Dr. Revonda StandardAllison at [facility] who [facility] however they do not have any bed availability at this time.  Urologist was consulted recommended no urological intervention needed at this point as patient's renal functions are stable.  Patient is admitted for UTI and right renal hydronephrosis.  Assessment & Plan:   Principal Problem:   UTI (urinary tract infection) Active Problems:   History of renal transplant   Diabetes mellitus (HCC)   Hyperlipidemia   Accelerated hypertension   Hypokalemia   Dementia (HCC)   Hydronephrosis of right kidney   Rib fractures  Right kidney hydronephrosis with UTI and right ureteral stricture: -Patient presented with lower abdominal pain in the setting of right renal transplant. -UA is positive for infection.  Urine culture is pending.  Reviewed CT abdomen/pelvis. -Patient's kidney functions are stable, creatinine: 0.85, GFR: More than 60. -Continue Rocephin.  Avoid nephrotoxic  medication. -[name] consulted [facility]rology at [facility] who recommended transfer however no beds are available at this time. -Urology Dr. Retta Dionesahlstedt- recommended no intervention needed at this time as patient's renal function is stable.  UTI: Continue Rocephin, follow-up urine cultures. Urine culture grew 100,000 colonies of E. coli.  Sensitivity pending.  Hypokalemia:> Resolved. -Replenished.    Accelerated hypertension: Patient's blood pressure elevated upon arrival Continue hydralazine as needed for blood pressure above 160/100 -Hold losartan at this time.  Type 2 diabetes mellitus:  HbA1c  9.7. Fasting glucose 63,   Reduce Lantus dose to 30 unit qhs.  Continue sliding scale. -Monitor blood sugar closely  Rib fractures: Noted on CT abdomen/pelvis.  Patient is asymptomatic.   She is maintaining oxygen saturation on room air.  No trauma reported Continue as needed Pain medication.  Continue pulse ox  Dementia: Continue Namenda, Seroquel and Zoloft  Hyperlipidemia: Continue statin  History of renal transplant: Continue tacrolimus and prednisone.  Autonomic orthostatic hypotension Autonomic instability She is on levothyroxine, fludrocortisone- we will continue same medications    DVT prophylaxis:  Lovenox. Code Status: Full code. Family Communication: No Family at bedside.   Disposition Plan:   Status is: Inpatient  Remains inpatient appropriate because:Inpatient level of care appropriate due to severity of illness   Dispo: The patient is from: Home              Anticipated d/c is to: SNF              Patient currently is not medically stable to d/c.   Difficult to place patient No    Consultants:   Non formal urology consult  Procedures:  Antimicrobials:   Anti-infectives (From admission, onward)   Start     Dose/Rate Route Frequency Ordered Stop   05/03/20 1200  cefTRIAXone (ROCEPHIN) 1 g in sodium chloride 0.9 % 100 mL IVPB        1 g 200 mL/hr  over 30 Minutes Intravenous Every 24 hours 05/02/20 1706     05/02/20 1300  cefTRIAXone (ROCEPHIN) 1 g in sodium chloride 0.9 % 100 mL IVPB        1 g 200 mL/hr over 30 Minutes Intravenous  Once 05/01/20 1559 05/02/20 1314   05/01/20 1315  cefTRIAXone (ROCEPHIN) 1 g in sodium chloride 0.9 % 100 mL IVPB        1 g 200 mL/hr over 30 Minutes Intravenous  Once 05/01/20 1300 05/01/20 1343     Subjective: Patient was seen and examined at bedside.  Overnight events noted.   Patient appears comfortable,  denies any urinary symptoms.  Denies any abdominal pain.  Objective: Vitals:   05/02/20 1505 05/03/20 0028 05/03/20 0510 05/03/20 1321  BP: (!) 190/81 (!) 138/59 (!) 158/70 (!) 161/71  Pulse: 81 80 78 73  Resp: 20 18 18  (!) 9  Temp: 98.6 F (37 C) 98.9 F (37.2 C) 98.9 F (37.2 C) 98.4 F (36.9 C)  TempSrc: Oral Oral Oral Oral  SpO2: 94% 98% 94% 95%  Weight:   68.3 kg   Height:        Intake/Output Summary (Last 24 hours) at 05/03/2020 1632 Last data filed at 05/03/2020 1614 Gross per 24 hour  Intake 1307 ml  Output 1700 ml  Net -393 ml   Filed Weights   05/01/20 1715 05/02/20 0500 05/03/20 0510  Weight: 69 kg 49.1 kg 68.3 kg    Examination:  General exam: Appears calm and comfortable, not in any acute distress Respiratory system: Clear to auscultation. Respiratory effort normal. Cardiovascular system: S1 & S2 heard, RRR. No JVD, murmurs, rubs, gallops or clicks. No pedal edema. Gastrointestinal system: Abdomen is nondistended, soft and nontender. No organomegaly or masses felt. Normal bowel sounds heard. Central nervous system: Alert and oriented. No focal neurological deficits. Extremities: Symmetric 5 x 5 power.  No edema, no cyanosis, no clubbing. Skin: No rashes, lesions or ulcers Psychiatry: Judgement and insight appear normal. Mood & affect appropriate.     Data Reviewed: I have personally reviewed following labs and imaging studies  CBC: Recent Labs  Lab  05/01/20 0931 05/01/20 1732 05/02/20 0408 05/03/20 0348  WBC 8.0 9.5 8.2 7.8  NEUTROABS 6.1  --   --   --   HGB 13.0 14.6 12.1 12.8  HCT 41.6 46.3* 39.1 40.6  MCV 81.6 81.5 82.7 81.7  PLT 188 210 177 206   Basic Metabolic Panel: Recent Labs  Lab 05/01/20 0931 05/01/20 1732 05/02/20 0408 05/03/20 0348  NA 137  --  145 142  K 3.1*  --  3.8 3.9  CL 104  --  109 107  CO2 26  --  29 26  GLUCOSE 358*  --  79 63*  BUN 13  --  13 12  CREATININE 0.85 0.79 0.88 0.65  CALCIUM 8.7*  --  8.8* 9.4  MG  --  1.8  --  1.6*  PHOS  --   --   --  4.4   GFR: Estimated Creatinine Clearance: 59.3 mL/min (by C-G formula based on SCr of 0.65 mg/dL). Liver Function Tests: Recent Labs  Lab 05/01/20 0931 05/02/20 0408  AST 17 97*  ALT 16 39  ALKPHOS 65 114  BILITOT 0.9 1.1  PROT 6.4* 5.9*  ALBUMIN 3.2* 3.1*   Recent Labs  Lab 05/01/20 0931  LIPASE 20   No results for input(s): AMMONIA in the last 168 hours. Coagulation Profile: No results for input(s): INR, PROTIME in the last 168 hours. Cardiac Enzymes: No results for input(s): CKTOTAL, CKMB, CKMBINDEX, TROPONINI in the last 168 hours. BNP (last 3 results) No results for input(s): PROBNP in the last 8760 hours. HbA1C: Recent Labs    05/03/20 0348  HGBA1C 9.7*   CBG: Recent Labs  Lab 05/02/20 1643 05/02/20 2051 05/03/20 0724 05/03/20 1152 05/03/20 1628  GLUCAP 260* 225* 130* 356* 284*   Lipid Profile: No results for input(s): CHOL, HDL, LDLCALC, TRIG, CHOLHDL, LDLDIRECT in the last 72 hours. Thyroid Function Tests: Recent Labs    05/01/20 1732  TSH 2.396   Anemia Panel: No results for input(s): VITAMINB12, FOLATE, FERRITIN, TIBC, IRON, RETICCTPCT in the last 72 hours. Sepsis Labs: No results for input(s): PROCALCITON, LATICACIDVEN in the last 168 hours.  Recent Results (from the past 240 hour(s))  Urine culture     Status: Abnormal (Preliminary result)   Collection Time: 05/01/20  1:03 PM   Specimen: Urine,  Random  Result Value Ref Range Status   Specimen Description   Final    URINE, RANDOM Performed at [facility], [address] [place], [place] [address]    Special Requests   Final    NONE Performed at [facility], [address] [place], [place] [address]    Culture (A)  Final    >=100,000 COLONIES/mL ESCHERICHIA COLI SUSCEPTIBILITIES TO FOLLOW Performed at [facility], [address], [place], [place] [address]    Report Status PENDING  Incomplete  SARS CORONAVIRUS 2 (TAT 6-24 HRS) Nasopharyngeal Nasopharyngeal Swab     Status: None   Collection Time: 05/01/20  6:40 PM   Specimen: Nasopharyngeal Swab  Result Value Ref Range Status   SARS Coronavirus 2 NEGATIVE NEGATIVE Final    Comment: (NOTE) SARS-CoV-2 target nucleic acids are NOT DETECTED.  The SARS-CoV-2 RNA is generally detectable in upper and lower respiratory specimens during the acute phase of infection. Negative results do not preclude SARS-CoV-2 infection, do not rule out co-infections with other pathogens, and should not be used as the sole basis for treatment or other patient management decisions. Negative results must be combined with clinical observations, patient history, and epidemiological information. The expected result is Negative.  Fact Sheet for Patients: HairSlick.no  Fact Sheet for Healthcare Providers: quierodirigir.com  This test is not yet approved or cleared by the [place] FDA and  has been authorized for detection and/or diagnosis of SARS-CoV-2 by FDA under an Emergency Use Authorization (EUA). This EUA will remain  in effect (meaning this test can be used) for the duration of the COVID-19 declaration under Se ction 564(b)(1) of the Act, 21 U.S.C. section 360bbb-3(b)(1), unless the authorization is terminated or revoked sooner.  Performed at [facility], [address] [place],  [place] [address]   MRSA PCR Screening     Status: None   Collection Time: 05/01/20  6:40 PM   Specimen: Nasopharyngeal  Result Value Ref Range Status   MRSA by PCR NEGATIVE NEGATIVE Final    Comment:        The GeneXpert MRSA Assay (FDA approved for NASAL specimens only), is one component of a comprehensive MRSA colonization surveillance  program. It is not intended to diagnose MRSA infection nor to guide or monitor treatment for MRSA infections. Performed at [facility], [address] [place], [place] [address]      Radiology Studies: No results found. Scheduled Meds: . enoxaparin (LOVENOX) injection  40 mg Subcutaneous Q24H  . feeding supplement  237 mL Oral BID BM  . fludrocortisone  0.1 mg Oral QHS  . insulin aspart  0-15 Units Subcutaneous TID WC  . insulin aspart  0-5 Units Subcutaneous QHS  . insulin aspart  3 Units Subcutaneous TID WC  . insulin glargine  30 Units Subcutaneous QHS  . levothyroxine  100 mcg Oral Daily  . memantine  10 mg Oral Q12H  . multivitamin with minerals  1 tablet Oral Daily  . predniSONE  5 mg Oral Q breakfast  . QUEtiapine  25 mg Oral QHS  . rosuvastatin  5 mg Oral Daily  . sertraline  125 mg Oral Daily  . tacrolimus  0.5 mg Oral Daily   Continuous Infusions: . cefTRIAXone (ROCEPHIN)  IV 1 g (05/03/20 1109)     LOS: 2 days    Time spent: 25 mins.    Cipriano Bunker, MD [facility]   If 7PM-7AM, please contact night-coverage

## 2020-05-04 LAB — PHOSPHORUS: Phosphorus: 3.7 mg/dL (ref 2.5–4.6)

## 2020-05-04 LAB — GLUCOSE, CAPILLARY
Glucose-Capillary: 135 mg/dL — ABNORMAL HIGH (ref 70–99)
Glucose-Capillary: 269 mg/dL — ABNORMAL HIGH (ref 70–99)
Glucose-Capillary: 240 mg/dL — ABNORMAL HIGH (ref 70–99)
Glucose-Capillary: 245 mg/dL — ABNORMAL HIGH (ref 70–99)

## 2020-05-04 LAB — BASIC METABOLIC PANEL
Anion gap: 11 (ref 5–15)
BUN: 16 mg/dL (ref 8–23)
CO2: 29 mmol/L (ref 22–32)
Calcium: 9.6 mg/dL (ref 8.9–10.3)
Chloride: 101 mmol/L (ref 98–111)
Creatinine, Ser: 0.81 mg/dL (ref 0.44–1.00)
GFR, Estimated: >60 mL/min (ref >60)
Glucose, Bld: 179 mg/dL — ABNORMAL HIGH (ref 70–99)
Potassium: 4 mmol/L (ref 3.5–5.1)
Sodium: 141 mmol/L (ref 135–145)

## 2020-05-04 LAB — URINE CULTURE: Culture: >=100000 — AB

## 2020-05-04 LAB — MAGNESIUM: Magnesium: 1.6 mg/dL — ABNORMAL LOW (ref 1.7–2.4)

## 2020-05-04 MED ORDER — MAGNESIUM SULFATE 2 GM/50ML IV SOLN
2.0000 g | Freq: Once | INTRAVENOUS | Status: AC
  Administered 2020-05-04: 2 g via INTRAVENOUS
  Filled 2020-05-04: qty 50

## 2020-05-04 NOTE — Evaluation (Signed)
Occupational Therapy Evaluation Patient Details Name: Emma Maldonado MRN: [id number] DOB: 1950/03/19 Today's Date: 05/04/2020    History of Present Illness 70 yo female presents to ED on 4/20 from [facility] with LLQ pain x2 days. CT scan shows R lower quadrant renal transplant with new mild hydronephrosis and R ureteral stricture; posterior L 10th and 11th rib fractures. No intervention currently warranted per Urology. PMH significant for hypertension, diabetes, renal transplant on immunosuppressants, dementia, falls.   Clinical Impression   [name] is a 70 year old woman who overall needed min guard and supervision for all tasks and mobility. Patient found incontinent of BM and slid down in recliner with legs off to the side. Patient able to perform toileting, grooming and feeding tasks with verbal cues. She is able to ambulate to the bathroom with RW and needs verbal cues to not leave walker. She is unsteady and needs min guard for safety. Overall patient appears to be at her baseline. Recommend return to memory care. No OT needs at this time.    Follow Up Recommendations  No OT follow up;Other (comment) (Return to Memory Care)    Equipment Recommendations  None recommended by OT    Recommendations for Other Services       Precautions / Restrictions Precautions Precautions: Fall Precaution Comments: incontinence Restrictions Weight Bearing Restrictions: No      Mobility Bed Mobility Overal bed mobility: Needs Assistance Bed Mobility: Sit to Supine       Sit to supine: Supervision   General bed mobility comments: up in chair upon PT arrival to room    Transfers Overall transfer level: Needs assistance Equipment used: Rolling walker (2 wheeled) Transfers: Sit to/from Stand Sit to Stand: Min guard         General transfer comment: Min guard to ambulate to bathroom with RW, stand at sink, and return to bed. Needs verbal cues for safety  and walker management.    Balance Overall balance assessment: Mild deficits observed, not formally tested Sitting-balance support: No upper extremity supported;Feet supported Sitting balance-Leahy Scale: Good     Standing balance support: No upper extremity supported;During functional activity Standing balance-Leahy Scale: Fair Standing balance comment: able to ambulate without AD, close guard for safety with occasional steadying assist                           ADL either performed or assessed with clinical judgement   ADL Overall ADL's : Needs assistance/impaired Eating/Feeding: Independent   Grooming: Supervision/safety;Standing Grooming Details (indicate cue type and reason): stood at sink to wash hands Upper Body Bathing: Supervision/ safety;Set up   Lower Body Bathing: Supervison/ safety;Set up   Upper Body Dressing : Supervision/safety;Set up   Lower Body Dressing: Sit to/from stand;Set up;Minimal assistance   Toilet Transfer: Min guard;RW;Regular Toilet;Grab bars   Toileting- Architect and Hygiene: Min guard;Sit to/from stand;Supervision/safety Toileting - Clothing Manipulation Details (indicate cue type and reason): patient demonstrates ability to manage clothing and hygiene - though quality of task limited.             Vision   Vision Assessment?: No apparent visual deficits     Perception     Praxis      Pertinent Vitals/Pain Pain Assessment: Faces Faces Pain Scale: No hurt Pain Location: L flank Pain Descriptors / Indicators: Discomfort;Grimacing Pain Intervention(s): Limited activity within patient's tolerance;Monitored during session;Repositioned     Hand Dominance Right  Extremity/Trunk Assessment Upper Extremity Assessment Upper Extremity Assessment: Overall WFL for tasks assessed   Lower Extremity Assessment Lower Extremity Assessment: Defer to PT evaluation   Cervical / Trunk Assessment Cervical / Trunk  Assessment: Normal   Communication Communication Communication: No difficulties   Cognition Arousal/Alertness: Awake/alert Behavior During Therapy: WFL for tasks assessed/performed Overall Cognitive Status: History of cognitive impairments - at baseline                                 General Comments: history of dementia, inaccurate history giving this day but A&Ox3, follows mobility commands well.   General Comments  vss    Exercises General Exercises - Lower Extremity Long Arc Quad: AROM;Both;10 reps;Seated   Shoulder Instructions      Home Living Family/patient expects to be discharged to:: Other (Comment)                                 Additional Comments: Patient unreliable historian. Suspect needs assistance for mobility and ADLs - for safety.      Prior Functioning/Environment                   OT Problem List: Impaired balance (sitting and/or standing);Decreased cognition;Decreased knowledge of use of DME or AE;Decreased safety awareness      OT Treatment/Interventions:      OT Goals(Current goals can be found in the care plan section) Acute Rehab OT Goals OT Goal Formulation: All assessment and education complete, DC therapy  OT Frequency:     Barriers to D/C:            Co-evaluation              AM-PAC OT "6 Clicks" Daily Activity     Outcome Measure Help from another person eating meals?: None Help from another person taking care of personal grooming?: A Little Help from another person toileting, which includes using toliet, bedpan, or urinal?: A Little Help from another person bathing (including washing, rinsing, drying)?: A Little Help from another person to put on and taking off regular upper body clothing?: A Little Help from another person to put on and taking off regular lower body clothing?: A Little 6 Click Score: 19   End of Session Equipment Utilized During Treatment: Rolling walker Nurse  Communication: Mobility status  Activity Tolerance: Patient tolerated treatment well Patient left: in bed;with call bell/phone within reach;with bed alarm set  OT Visit Diagnosis: Unsteadiness on feet (R26.81)                Time: 1040-1055 OT Time Calculation (min): 15 min Charges:  OT General Charges $OT Visit: 1 Visit OT Evaluation $OT Eval Low Complexity: 1 Low  Malaisha Silliman, OTR/L Acute Care Rehab Services  Office [phone] Pager: [phone]   Kelli Churn 05/04/2020, 12:50 PM

## 2020-05-04 NOTE — Plan of Care (Signed)
?  Problem: Education: ?Goal: Knowledge of General Education information will improve ?Description: Including pain rating scale, medication(s)/side effects and non-pharmacologic comfort measures ?Outcome: Progressing ?  ?Problem: Clinical Measurements: ?Goal: Will remain free from infection ?Outcome: Progressing ?Goal: Diagnostic test results will improve ?Outcome: Progressing ?  ?Problem: Activity: ?Goal: Risk for activity intolerance will decrease ?Outcome: Progressing ?  ?Problem: Nutrition: ?Goal: Adequate nutrition will be maintained ?Outcome: Progressing ?  ?Problem: Elimination: ?Goal: Will not experience complications related to bowel motility ?Outcome: Progressing ?Goal: Will not experience complications related to urinary retention ?Outcome: Progressing ?  ?Problem: Pain Managment: ?Goal: General experience of comfort will improve ?Outcome: Progressing ?  ?Problem: Safety: ?Goal: Ability to remain free from injury will improve ?Outcome: Progressing ?  ?Problem: Skin Integrity: ?Goal: Risk for impaired skin integrity will decrease ?Outcome: Progressing ?  ?

## 2020-05-04 NOTE — Progress Notes (Signed)
PROGRESS NOTE    Emma Maldonado  OEU:[id number] DOB: August 30, 1950 DOA: 05/01/2020   PCP: Patient, No Pcp Per (Inactive)   Brief Narrative:  This 70 years old female with PMH significant for hypertension, diabetes, renal transplant on immunosuppressants follows up with nephrologist at [facility], dementia presents in the ED for the evaluation of lower abdominal pain. Patient is poor historian , history is obtained from ED notes.  Patient was given MiraLAX by nursing home staff despite having bowel movement,  Patient continued to complain of lower abdominal pain.  Patient denies any urinary symptoms, nausea, vomiting, fever.  In the ED her blood pressure was 185/ 88 other vitals were stable.  CT abdomen shows right lower quadrant renal transplant with new mild hydronephrosis and focal narrowing of the ureter near the bladder anastomosis possibly representing a stricture. There is acute left 10th and 11th ribs fractures. ED PA has spoken with nephrologist Dr. Revonda Standard at [facility] who recommended transfer to [facility] however they do not have any bed availability at this time.  Urologist was consulted recommended no urological intervention needed at this point as patient's renal functions are stable.  Patient is admitted for UTI and right renal hydronephrosis.  Assessment & Plan:   Principal Problem:   UTI (urinary tract infection) Active Problems:   History of renal transplant   Diabetes mellitus (HCC)   Hyperlipidemia   Accelerated hypertension   Hypokalemia   Dementia (HCC)   Hydronephrosis of right kidney   Rib fractures  Right kidney hydronephrosis with UTI and right ureteral stricture: -Patient presented with lower abdominal pain in the setting of right renal transplant. -UA is positive for infection.    Reviewed CT abdomen/pelvis. -Patient's kidney functions are stable, creatinine: 0.85, GFR: More than 60. -Continue Rocephin.  Avoid nephrotoxic medication. -[name] consulted  nephrology at [facility] who recommended transfer however no beds are available at this time. -Urology Dr. Retta Diones- recommended no intervention needed at this time as patient's renal function is stable.  UTI: Continue Rocephin, follow-up urine cultures. Urine culture grew 100,000 colonies of E. coli.  Sensitive to ceftriaxone. We will transition to p.o. antibiotics tomorrow at discharge  Hypokalemia:> Resolved. -Replenished.    Accelerated hypertension: Patient's blood pressure elevated upon arrival Continue hydralazine as needed for blood pressure above 160/100 Resume losartan as renal functions are stable.  Type 2 diabetes mellitus:  HbA1c  9.7. Fasting glucose 63,     Reduce Lantus dose to 30 unit qhs.  Continue sliding scale. -Monitor blood sugar closely  Rib fractures: Noted on CT abdomen/pelvis.  Patient is asymptomatic.   She is maintaining oxygen saturation on room air.  No trauma reported Continue as needed Pain medication.  Continue pulse ox  Dementia: Continue Namenda, Seroquel and Zoloft  Hyperlipidemia: Continue statin  History of renal transplant: Continue tacrolimus and prednisone.  Autonomic orthostatic hypotension Autonomic instability She is on levothyroxine, fludrocortisone- we will continue same medications    DVT prophylaxis:  Lovenox. Code Status: Full code. Family Communication: No Family at bedside.   Disposition Plan:   Status is: Inpatient  Remains inpatient appropriate because:Inpatient level of care appropriate due to severity of illness   Dispo: The patient is from: Home              Anticipated d/c is to:  Memory care unit tomorrow.              Patient currently is not medically stable to d/c.   Difficult to  place patient No    Consultants:   Non formal urology consult  Procedures:  Antimicrobials:   Anti-infectives (From admission, onward)   Start     Dose/Rate Route Frequency Ordered Stop   05/03/20 1200   cefTRIAXone (ROCEPHIN) 1 g in sodium chloride 0.9 % 100 mL IVPB        1 g 200 mL/hr over 30 Minutes Intravenous Every 24 hours 05/02/20 1706     05/02/20 1300  cefTRIAXone (ROCEPHIN) 1 g in sodium chloride 0.9 % 100 mL IVPB        1 g 200 mL/hr over 30 Minutes Intravenous  Once 05/01/20 1559 05/02/20 1314   05/01/20 1315  cefTRIAXone (ROCEPHIN) 1 g in sodium chloride 0.9 % 100 mL IVPB        1 g 200 mL/hr over 30 Minutes Intravenous  Once 05/01/20 1300 05/01/20 1343     Subjective: Patient was seen and examined at bedside.  Overnight events noted.  She denies any concerns. Patient appears comfortable,  denies any urinary symptoms.  Denies any abdominal pain.  Objective: Vitals:   05/03/20 2135 05/04/20 0428 05/04/20 0458 05/04/20 1255  BP: (!) 175/90 (!) 160/79  (!) 158/71  Pulse: 74 68  77  Resp: 18 18  13   Temp: 98.9 F (37.2 C) 98.7 F (37.1 C)  98.2 F (36.8 C)  TempSrc: Oral Oral  Oral  SpO2: 98% 95%  94%  Weight:   68.1 kg   Height:        Intake/Output Summary (Last 24 hours) at 05/04/2020 1515 Last data filed at 05/04/2020 1223 Gross per 24 hour  Intake 280 ml  Output 1100 ml  Net -820 ml   Filed Weights   05/02/20 0500 05/03/20 0510 05/04/20 0458  Weight: 49.1 kg 68.3 kg 68.1 kg    Examination:  General exam: Appears calm and comfortable, not in any acute distress Respiratory system: Clear to auscultation. Respiratory effort normal. Cardiovascular system: S1 & S2 heard, RRR. No JVD, murmurs, rubs, gallops or clicks. No pedal edema. Gastrointestinal system: Abdomen is nondistended, soft and nontender. No organomegaly or masses felt. Normal bowel sounds heard. Central nervous system: Alert and oriented. No focal neurological deficits. Extremities: Symmetric 5 x 5 power.  No edema, no cyanosis, no clubbing. Skin: No rashes, lesions or ulcers Psychiatry: Judgement and insight appear normal. Mood & affect appropriate.     Data Reviewed: I have personally  reviewed following labs and imaging studies  CBC: Recent Labs  Lab 05/01/20 0931 05/01/20 1732 05/02/20 0408 05/03/20 0348  WBC 8.0 9.5 8.2 7.8  NEUTROABS 6.1  --   --   --   HGB 13.0 14.6 12.1 12.8  HCT 41.6 46.3* 39.1 40.6  MCV 81.6 81.5 82.7 81.7  PLT 188 210 177 206   Basic Metabolic Panel: Recent Labs  Lab 05/01/20 0931 05/01/20 1732 05/02/20 0408 05/03/20 0348 05/04/20 0317  NA 137  --  145 142 141  K 3.1*  --  3.8 3.9 4.0  CL 104  --  109 107 101  CO2 26  --  29 26 29   GLUCOSE 358*  --  79 63* 179*  BUN 13  --  13 12 16   CREATININE 0.85 0.79 0.88 0.65 0.81  CALCIUM 8.7*  --  8.8* 9.4 9.6  MG  --  1.8  --  1.6* 1.6*  PHOS  --   --   --  4.4 3.7   GFR: Estimated Creatinine Clearance:  58.5 mL/min (by C-G formula based on SCr of 0.81 mg/dL). Liver Function Tests: Recent Labs  Lab 05/01/20 0931 05/02/20 0408  AST 17 97*  ALT 16 39  ALKPHOS 65 114  BILITOT 0.9 1.1  PROT 6.4* 5.9*  ALBUMIN 3.2* 3.1*   Recent Labs  Lab 05/01/20 0931  LIPASE 20   No results for input(s): AMMONIA in the last 168 hours. Coagulation Profile: No results for input(s): INR, PROTIME in the last 168 hours. Cardiac Enzymes: No results for input(s): CKTOTAL, CKMB, CKMBINDEX, TROPONINI in the last 168 hours. BNP (last 3 results) No results for input(s): PROBNP in the last 8760 hours. HbA1C: Recent Labs    05/03/20 0348  HGBA1C 9.7*   CBG: Recent Labs  Lab 05/03/20 1152 05/03/20 1628 05/03/20 2124 05/04/20 0726 05/04/20 1139  GLUCAP 356* 284* 195* 135* 269*   Lipid Profile: No results for input(s): CHOL, HDL, LDLCALC, TRIG, CHOLHDL, LDLDIRECT in the last 72 hours. Thyroid Function Tests: Recent Labs    05/01/20 1732  TSH 2.396   Anemia Panel: No results for input(s): VITAMINB12, FOLATE, FERRITIN, TIBC, IRON, RETICCTPCT in the last 72 hours. Sepsis Labs: No results for input(s): PROCALCITON, LATICACIDVEN in the last 168 hours.  Recent Results (from the past  240 hour(s))  Urine culture     Status: Abnormal   Collection Time: 05/01/20  1:03 PM   Specimen: Urine, Random  Result Value Ref Range Status   Specimen Description   Final    URINE, RANDOM Performed at [facility], [address] [place], [place] [address]    Special Requests   Final    NONE Performed at [facility], [address], [place], [place] [address]    Culture >=100,000 COLONIES/mL ESCHERICHIA COLI (A)  Final   Report Status 05/04/2020 FINAL  Final   Organism ID, Bacteria ESCHERICHIA COLI (A)  Final      Susceptibility   Escherichia coli - MIC*    AMPICILLIN 8 SENSITIVE Sensitive     CEFAZOLIN <=4 SENSITIVE Sensitive     CEFEPIME <=0.12 SENSITIVE Sensitive     CEFTRIAXONE <=0.25 SENSITIVE Sensitive     CIPROFLOXACIN <=0.25 SENSITIVE Sensitive     GENTAMICIN <=1 SENSITIVE Sensitive     IMIPENEM <=0.25 SENSITIVE Sensitive     NITROFURANTOIN <=16 SENSITIVE Sensitive     TRIMETH/SULFA <=20 SENSITIVE Sensitive     AMPICILLIN/SULBACTAM 4 SENSITIVE Sensitive     PIP/TAZO <=4 SENSITIVE Sensitive     * >=100,000 COLONIES/mL ESCHERICHIA COLI  SARS CORONAVIRUS 2 (TAT 6-24 HRS) Nasopharyngeal Nasopharyngeal Swab     Status: None   Collection Time: 05/01/20  6:40 PM   Specimen: Nasopharyngeal Swab  Result Value Ref Range Status   SARS Coronavirus 2 NEGATIVE NEGATIVE Final    Comment: (NOTE) SARS-CoV-2 target nucleic acids are NOT DETECTED.  The SARS-CoV-2 RNA is generally detectable in upper and lower respiratory specimens during the acute phase of infection. Negative results do not preclude SARS-CoV-2 infection, do not rule out co-infections with other pathogens, and should not be used as the sole basis for treatment or other patient management decisions. Negative results must be combined with clinical observations, patient history, and epidemiological information. The expected result is Negative.  Fact Sheet for  Patients: HairSlick.no  Fact Sheet for Healthcare Providers: quierodirigir.com  This test is not yet approved or cleared by the [place] FDA and  has been authorized for detection and/or diagnosis of SARS-CoV-2 by FDA under an  Emergency Use Authorization (EUA). This EUA will remain  in effect (meaning this test can be used) for the duration of the COVID-19 declaration under Se ction 564(b)(1) of the Act, 21 U.S.C. section 360bbb-3(b)(1), unless the authorization is terminated or revoked sooner.  Performed at [facility], [address] [place], [place] [address]   MRSA PCR Screening     Status: None   Collection Time: 05/01/20  6:40 PM   Specimen: Nasopharyngeal  Result Value Ref Range Status   MRSA by PCR NEGATIVE NEGATIVE Final    Comment:        The GeneXpert MRSA Assay (FDA approved for NASAL specimens only), is one component of a comprehensive MRSA colonization surveillance program. It is not intended to diagnose MRSA infection nor to guide or monitor treatment for MRSA infections. Performed at [facility], [address] [place], [place] [address]      Radiology Studies: No results found. Scheduled Meds: . enoxaparin (LOVENOX) injection  40 mg Subcutaneous Q24H  . feeding supplement  237 mL Oral BID BM  . fludrocortisone  0.1 mg Oral QHS  . insulin aspart  0-15 Units Subcutaneous TID WC  . insulin aspart  0-5 Units Subcutaneous QHS  . insulin aspart  3 Units Subcutaneous TID WC  . insulin glargine  30 Units Subcutaneous QHS  . levothyroxine  100 mcg Oral Daily  . memantine  10 mg Oral Q12H  . multivitamin with minerals  1 tablet Oral Daily  . predniSONE  5 mg Oral Q breakfast  . QUEtiapine  25 mg Oral QHS  . rosuvastatin  5 mg Oral Daily  . sertraline  125 mg Oral Daily  . tacrolimus  0.5 mg Oral Daily   Continuous Infusions: . cefTRIAXone (ROCEPHIN)  IV 1 g (05/04/20  1250)  . magnesium sulfate bolus IVPB 2 g (05/04/20 1424)     LOS: 3 days    Time spent: 25 mins.    Cipriano Bunker, MD [facility]   If 7PM-7AM, please contact night-coverage

## 2020-05-04 NOTE — Evaluation (Signed)
Physical Therapy Evaluation Patient Details Name: Emma Maldonado MRN: [id number] DOB: Jun 21, 1950 Today's Date: 05/04/2020   History of Present Illness  70 yo female presents to ED on 4/20 from [facility] with LLQ pain x2 days. CT scan shows R lower quadrant renal transplant with new mild hydronephrosis and R ureteral stricture; posterior L 10th and 11th rib fractures. No intervention currently warranted per Urology. PMH significant for hypertension, diabetes, renal transplant on immunosuppressants, dementia, falls.  Clinical Impression   Pt presents with generalized weakness, impaired standing balance with history of falls, impaired gait, and decreased activity tolerance. Pt to benefit from acute PT to address deficits. Pt ambulated hallway distance with occasional min assist to steady, pt reaching for environment to self-steady throughout. PT feels pt is likely close to baseline, will follow with PT acutely but do not anticipate any follow up PT needs. PT to progress mobility as tolerated, and will continue to follow acutely.      Follow Up Recommendations Other (comment) (return to memory care unit)    Equipment Recommendations  None recommended by PT    Recommendations for Other Services       Precautions / Restrictions Precautions Precautions: Fall Precaution Comments: urinary incontinence Restrictions Weight Bearing Restrictions: No      Mobility  Bed Mobility Overal bed mobility: Needs Assistance             General bed mobility comments: up in chair upon PT arrival to room    Transfers Overall transfer level: Needs assistance Equipment used: None Transfers: Sit to/from Stand Sit to Stand: Min assist         General transfer comment: min assist for rise and steady, sts x2 from recliner and toilet.  Ambulation/Gait Ambulation/Gait assistance: Min assist;Min guard Gait Distance (Feet): 125 Feet Assistive device: None Gait  Pattern/deviations: Step-through pattern;Decreased stride length;Trunk flexed;Shuffle Gait velocity: decr   General Gait Details: min guard for safety, occasional min assist to steady. Pt reaching for environment to self-steady throughout. Pt taking very short, shuffling steps and balance worsens when attempting to walk after, PT cuing pt to slow down to improve gait parameters  Stairs            Wheelchair Mobility    Modified Rankin (Stroke Patients Only)       Balance Overall balance assessment: Needs assistance;History of Falls Sitting-balance support: No upper extremity supported;Feet supported Sitting balance-Leahy Scale: Good     Standing balance support: No upper extremity supported;During functional activity Standing balance-Leahy Scale: Fair Standing balance comment: able to ambulate without AD, close guard for safety with occasional steadying assist                             Pertinent Vitals/Pain Pain Assessment: Faces Faces Pain Scale: Hurts a little bit Pain Location: L flank Pain Descriptors / Indicators: Discomfort;Grimacing Pain Intervention(s): Limited activity within patient's tolerance;Monitored during session;Repositioned    Home Living Family/patient expects to be discharged to:: Private residence Living Arrangements: Spouse/significant other Available Help at Discharge: Family Type of Home: House Home Access: Stairs to enter Entrance Stairs-Rails: Right Entrance Stairs-Number of Steps: 1 Home Layout: One level Home Equipment: Environmental consultant - 2 wheels;Cane - single point Additional Comments: Per notes, from [facility] (not SNF)    Prior Function Level of Independence: Needs assistance   Gait / Transfers Assistance Needed: walked without AD - per chart review, pt states she walks with a  RW and is independent  ADL's / Homemaking Assistance Needed: was doing her own bathing/dressing prior to going to [facility] - per chart  review  Comments: Pt from memory care, was living at home prior to this. Pt gave above history to PT, pt is a poor historian with history of dementia     Hand Dominance   Dominant Hand: Right    Extremity/Trunk Assessment   Upper Extremity Assessment Upper Extremity Assessment: Defer to OT evaluation    Lower Extremity Assessment Lower Extremity Assessment: Generalized weakness    Cervical / Trunk Assessment Cervical / Trunk Assessment: Normal  Communication   Communication: No difficulties  Cognition Arousal/Alertness: Awake/alert Behavior During Therapy: WFL for tasks assessed/performed Overall Cognitive Status: History of cognitive impairments - at baseline                                 General Comments: history of dementia, inaccurate history giving this day but A&Ox3, follows mobility commands well.      General Comments General comments (skin integrity, edema, etc.): vss    Exercises General Exercises - Lower Extremity Long Arc Quad: AROM;Both;10 reps;Seated   Assessment/Plan    PT Assessment Patient needs continued PT services  PT Problem List Decreased strength;Decreased activity tolerance;Decreased balance;Decreased knowledge of use of DME;Decreased mobility;Decreased cognition;Decreased safety awareness;Pain       PT Treatment Interventions Therapeutic activities;Gait training;Therapeutic exercise;Balance training;Functional mobility training;Neuromuscular re-education    PT Goals (Current goals can be found in the Care Plan section)  Acute Rehab PT Goals PT Goal Formulation: With patient Time For Goal Achievement: 05/18/20 Potential to Achieve Goals: Good    Frequency Min 2X/week   Barriers to discharge        Co-evaluation               AM-PAC PT "6 Clicks" Mobility  Outcome Measure Help needed turning from your back to your side while in a flat bed without using bedrails?: A Little Help needed moving from lying on your  back to sitting on the side of a flat bed without using bedrails?: A Little Help needed moving to and from a bed to a chair (including a wheelchair)?: A Little Help needed standing up from a chair using your arms (e.g., wheelchair or bedside chair)?: A Little Help needed to walk in hospital room?: A Little Help needed climbing 3-5 steps with a railing? : A Little 6 Click Score: 18    End of Session Equipment Utilized During Treatment: Gait belt Activity Tolerance: Patient tolerated treatment well;Patient limited by fatigue Patient left: with chair alarm set;in chair;with call bell/phone within reach Nurse Communication: Mobility status PT Visit Diagnosis: Difficulty in walking, not elsewhere classified (R26.2);Muscle weakness (generalized) (M62.81)    Time: 9211-9417 PT Time Calculation (min) (ACUTE ONLY): 20 min   Charges:   PT Evaluation $PT Eval Low Complexity: 1 Low        Marye Round, PT DPT Acute Rehabilitation Services Pager [phone]  Office [phone]   Truddie Coco 05/04/2020, 9:56 AM

## 2020-05-05 ENCOUNTER — Inpatient Hospital Stay (HOSPITAL_COMMUNITY): Payer: Medicare Other

## 2020-05-05 LAB — BASIC METABOLIC PANEL
Anion gap: 9 (ref 5–15)
BUN: 16 mg/dL (ref 8–23)
CO2: 30 mmol/L (ref 22–32)
Calcium: 9.4 mg/dL (ref 8.9–10.3)
Chloride: 106 mmol/L (ref 98–111)
Creatinine, Ser: 0.66 mg/dL (ref 0.44–1.00)
GFR, Estimated: >60 mL/min (ref >60)
Glucose, Bld: 161 mg/dL — ABNORMAL HIGH (ref 70–99)
Potassium: 4.3 mmol/L (ref 3.5–5.1)
Sodium: 145 mmol/L (ref 135–145)

## 2020-05-05 LAB — RESP PANEL BY RT-PCR (FLU A&B, COVID) ARPGX2
Influenza A by PCR: NEGATIVE
Influenza B by PCR: NEGATIVE
SARS Coronavirus 2 by RT PCR: NEGATIVE

## 2020-05-05 LAB — PHOSPHORUS: Phosphorus: 2.8 mg/dL (ref 2.5–4.6)

## 2020-05-05 LAB — HEMOGLOBIN AND HEMATOCRIT, BLOOD
HCT: 42.1 % (ref 36.0–46.0)
Hemoglobin: 13.4 g/dL (ref 12.0–15.0)

## 2020-05-05 LAB — GLUCOSE, CAPILLARY
Glucose-Capillary: 122 mg/dL — ABNORMAL HIGH (ref 70–99)
Glucose-Capillary: 296 mg/dL — ABNORMAL HIGH (ref 70–99)
Glucose-Capillary: 200 mg/dL — ABNORMAL HIGH (ref 70–99)
Glucose-Capillary: 219 mg/dL — ABNORMAL HIGH (ref 70–99)

## 2020-05-05 LAB — MAGNESIUM: Magnesium: 1.7 mg/dL (ref 1.7–2.4)

## 2020-05-05 MED ORDER — LOSARTAN POTASSIUM 25 MG PO TABS
25.0000 mg | ORAL_TABLET | Freq: Every day | ORAL | Status: DC
  Administered 2020-05-05 – 2020-05-06 (×2): 25 mg via ORAL
  Filled 2020-05-05 (×2): qty 1

## 2020-05-05 NOTE — Progress Notes (Signed)
PROGRESS NOTE    Emma Maldonado  ZOX:[id number] DOB: 12/11/1950 DOA: 05/01/2020   PCP: Patient, No Pcp Per (Inactive)   Brief Narrative:  This 70 years old feTalbot Grumblingmale with PMH significant for hypertension, diabetes, renal transplant on immunosuppressants [facility], dementia presents in the ED for the evaluation of lower abdominal pain. Patient is poor historian , history is obtained from ED notes.  Patient was given MiraLAX by nursing home staff despite having bowel movement,  Patient continued to complain of lower abdominal pain.  Patient denies any urinary symptoms, nausea, vomiting, fever.  In the ED her blood pressure was 185/ 88 other vitals were stable.  CT abdomen shows right lower quadrant renal transplant with new mild hydronephrosis and focal narrowing of the ureter near the bladder anastomosis possibly representing a stricture. There is acute left 10th and 11th ribs fractures. ED PA has spoken with nephrologist [facility] Rev[facility] Standard[facility]ison [facility] who [facility] however they do not have any bed availability at this time.  [facility] recommended no urological intervention needed at this point as patient's renal functions are stable.  Patient is admitted for UTI and right renal hydronephrosis.  Assessment & Plan:   Principal Problem:   UTI (urinary tract infection) Active Problems:   History of renal transplant   Diabetes mellitus (HCC)   Hyperlipidemia   Accelerated hypertension   Hypokalemia   Dementia (HCC)   Hydronephrosis of right kidney   Rib fractures  Right kidney hydronephrosis with UTI and right ureteral stricture: -Patient presented with lower abdominal pain in the setting of right renal transplant. -UA is positive for infection.    Reviewed CT abdomen/pelvis. -Patient's kidney functions are stable, creatinine: 0.85, GFR: More than 60. -Continue Rocephin.  Avoid nephrotoxic medication. -[name]  [facility] who [facility] transfer however no beds are available at this time. -Urology Dr. Retta Dionesahlstedt- recommended no intervention needed at this time as patient's renal function is stable. -Patient seems much improved. Obtain renal ultrasound.  UTI: Continue Rocephin, follow-up urine cultures. Urine culture grew 100,000 colonies of E. coli.  Sensitive to ceftriaxone. We will transition to p.o. antibiotics tomorrow at discharge.  Hypokalemia:> Resolved. -Replenished.    Accelerated hypertension:  Patient's blood pressure elevated upon arrival Continue hydralazine as needed for blood pressure above 160/100 Resume losartan as renal functions are stable.  Type 2 diabetes mellitus:  HbA1c  9.7. Fasting glucose 63,     Reduce Lantus dose to 30 unit qhs.  Continue sliding scale. -Monitor blood sugar closely  Rib fractures: Noted on CT abdomen/pelvis.  Patient is asymptomatic.   She is maintaining oxygen saturation on room air.  No trauma reported Continue as needed Pain medication.  Continue pulse ox  Dementia: Continue Namenda, Seroquel and Zoloft  Hyperlipidemia: Continue statin  History of renal transplant:  Continue tacrolimus and prednisone.  Autonomic orthostatic hypotension Autonomic instability She is on levothyroxine, fludrocortisone- we will continue same medications    DVT prophylaxis:  Lovenox. Code Status: Full code. Family Communication: No Family at bedside.   Disposition Plan:   Status is: Inpatient  Remains inpatient appropriate because:Inpatient level of care appropriate due to severity of illness   Dispo: The patient is from: Home              Anticipated d/c is to:  Memory care unit 4/25              Patient  currently is not medically stable to d/c.   Difficult to place patient No    Consultants:   Non formal urology consult  Procedures:  Antimicrobials:   Anti-infectives (From admission, onward)   Start      Dose/Rate Route Frequency Ordered Stop   05/03/20 1200  cefTRIAXone (ROCEPHIN) 1 g in sodium chloride 0.9 % 100 mL IVPB        1 g 200 mL/hr over 30 Minutes Intravenous Every 24 hours 05/02/20 1706     05/02/20 1300  cefTRIAXone (ROCEPHIN) 1 g in sodium chloride 0.9 % 100 mL IVPB        1 g 200 mL/hr over 30 Minutes Intravenous  Once 05/01/20 1559 05/02/20 1314   05/01/20 1315  cefTRIAXone (ROCEPHIN) 1 g in sodium chloride 0.9 % 100 mL IVPB        1 g 200 mL/hr over 30 Minutes Intravenous  Once 05/01/20 1300 05/01/20 1343     Subjective: Patient was seen and examined at bedside.  Overnight events noted.  She denies any concerns. Patient appears comfortable,  denies any urinary symptoms.  Denies any abdominal pain. Patient wants to be discharged tomorrow, states she feels weak.  Objective: Vitals:   05/04/20 2150 05/05/20 0500 05/05/20 0644 05/05/20 1340  BP: (!) 172/75  (!) 123/55 (!) 168/78  Pulse: 70  67 77  Resp: 20  16 14   Temp: 98.5 F (36.9 C)  98.8 F (37.1 C) 99 F (37.2 C)  TempSrc:    Oral  SpO2: 92%  92% 97%  Weight:  57.9 kg    Height:        Intake/Output Summary (Last 24 hours) at 05/05/2020 1536 Last data filed at 05/05/2020 0900 Gross per 24 hour  Intake 240 ml  Output --  Net 240 ml   Filed Weights   05/03/20 0510 05/04/20 0458 05/05/20 0500  Weight: 68.3 kg 68.1 kg 57.9 kg    Examination:  General exam: Appears calm and comfortable, not in any acute distress Respiratory system: Clear to auscultation. Respiratory effort normal. Cardiovascular system: S1 & S2 heard, RRR. No JVD, murmurs, rubs, gallops or clicks. No pedal edema. Gastrointestinal system: Abdomen is nondistended, soft and nontender. No organomegaly or masses felt.  Normal bowel sounds heard. Central nervous system: Alert and oriented. No focal neurological deficits. Extremities: Symmetric 5 x 5 power.  No edema, no cyanosis, no clubbing. Skin: No rashes, lesions or  ulcers Psychiatry: Judgement and insight appear normal. Mood & affect appropriate.     Data Reviewed: I have personally reviewed following labs and imaging studies  CBC: Recent Labs  Lab 05/01/20 0931 05/01/20 1732 05/02/20 0408 05/03/20 0348 05/05/20 0402  WBC 8.0 9.5 8.2 7.8  --   NEUTROABS 6.1  --   --   --   --   HGB 13.0 14.6 12.1 12.8 13.4  HCT 41.6 46.3* 39.1 40.6 42.1  MCV 81.6 81.5 82.7 81.7  --   PLT 188 210 177 206  --    Basic Metabolic Panel: Recent Labs  Lab 05/01/20 0931 05/01/20 1732 05/02/20 0408 05/03/20 0348 05/04/20 0317 05/05/20 0402  NA 137  --  145 142 141 145  K 3.1*  --  3.8 3.9 4.0 4.3  CL 104  --  109 107 101 106  CO2 26  --  29 26 29 30   GLUCOSE 358*  --  79 63* 179* 161*  BUN 13  --  13 12 16  16  CREATININE 0.85 0.79 0.88 0.65 0.81 0.66  CALCIUM 8.7*  --  8.8* 9.4 9.6 9.4  MG  --  1.8  --  1.6* 1.6* 1.7  PHOS  --   --   --  4.4 3.7 2.8   GFR: Estimated Creatinine Clearance: 51.8 mL/min (by C-G formula based on SCr of 0.66 mg/dL). Liver Function Tests: Recent Labs  Lab 05/01/20 0931 05/02/20 0408  AST 17 97*  ALT 16 39  ALKPHOS 65 114  BILITOT 0.9 1.1  PROT 6.4* 5.9*  ALBUMIN 3.2* 3.1*   Recent Labs  Lab 05/01/20 0931  LIPASE 20   No results for input(s): AMMONIA in the last 168 hours. Coagulation Profile: No results for input(s): INR, PROTIME in the last 168 hours. Cardiac Enzymes: No results for input(s): CKTOTAL, CKMB, CKMBINDEX, TROPONINI in the last 168 hours. BNP (last 3 results) No results for input(s): PROBNP in the last 8760 hours. HbA1C: Recent Labs    05/03/20 0348  HGBA1C 9.7*   CBG: Recent Labs  Lab 05/04/20 1139 05/04/20 1708 05/04/20 2151 05/05/20 0733 05/05/20 1128  GLUCAP 269* 240* 245* 122* 296*   Lipid Profile: No results for input(s): CHOL, HDL, LDLCALC, TRIG, CHOLHDL, LDLDIRECT in the last 72 hours. Thyroid Function Tests: No results for input(s): TSH, T4TOTAL, FREET4, T3FREE,  THYROIDAB in the last 72 hours. Anemia Panel: No results for input(s): VITAMINB12, FOLATE, FERRITIN, TIBC, IRON, RETICCTPCT in the last 72 hours. Sepsis Labs: No results for input(s): PROCALCITON, LATICACIDVEN in the last 168 hours.  Recent Results (from the past 240 hour(s))  Urine culture     Status: Abnormal   Collection Time: 05/01/20  1:03 PM   Specimen: Urine, Random  Result Value Ref Range Status   Specimen Description   Final    URINE, RANDOM Performed at [facility], [address] [place], [place] [address]    Special Requests   Final    NONE Performed at [facility], [address], [place], [place] [address]    Culture >=100,000 COLONIES/mL ESCHERICHIA COLI (A)  Final   Report Status 05/04/2020 FINAL  Final   Organism ID, Bacteria ESCHERICHIA COLI (A)  Final      Susceptibility   Escherichia coli - MIC*    AMPICILLIN 8 SENSITIVE Sensitive     CEFAZOLIN <=4 SENSITIVE Sensitive     CEFEPIME <=0.12 SENSITIVE Sensitive     CEFTRIAXONE <=0.25 SENSITIVE Sensitive     CIPROFLOXACIN <=0.25 SENSITIVE Sensitive     GENTAMICIN <=1 SENSITIVE Sensitive     IMIPENEM <=0.25 SENSITIVE Sensitive     NITROFURANTOIN <=16 SENSITIVE Sensitive     TRIMETH/SULFA <=20 SENSITIVE Sensitive     AMPICILLIN/SULBACTAM 4 SENSITIVE Sensitive     PIP/TAZO <=4 SENSITIVE Sensitive     * >=100,000 COLONIES/mL ESCHERICHIA COLI  SARS CORONAVIRUS 2 (TAT 6-24 HRS) Nasopharyngeal Nasopharyngeal Swab     Status: None   Collection Time: 05/01/20  6:40 PM   Specimen: Nasopharyngeal Swab  Result Value Ref Range Status   SARS Coronavirus 2 NEGATIVE NEGATIVE Final    Comment: (NOTE) SARS-CoV-2 target nucleic acids are NOT DETECTED.  The SARS-CoV-2 RNA is generally detectable in upper and lower respiratory specimens during the acute phase of infection. Negative results do not preclude SARS-CoV-2 infection, do not rule out co-infections with other pathogens, and should not be  used as the sole basis for treatment or other patient management decisions. Negative results must be combined with clinical observations, patient  history, and epidemiological information. The expected result is Negative.  Fact Sheet for Patients: HairSlick.no  Fact Sheet for Healthcare Providers: quierodirigir.com  This test is not yet approved or cleared by the [place] FDA and  has been authorized for detection and/or diagnosis of SARS-CoV-2 by FDA under an Emergency Use Authorization (EUA). This EUA will remain  in effect (meaning this test can be used) for the duration of the COVID-19 declaration under Se ction 564(b)(1) of the Act, 21 U.S.C. section 360bbb-3(b)(1), unless the authorization is terminated or revoked sooner.  Performed at [facility], [address] [place], [place] [address]   MRSA PCR Screening     Status: None   Collection Time: 05/01/20  6:40 PM   Specimen: Nasopharyngeal  Result Value Ref Range Status   MRSA by PCR NEGATIVE NEGATIVE Final    Comment:        The GeneXpert MRSA Assay (FDA approved for NASAL specimens only), is one component of a comprehensive MRSA colonization surveillance program. It is not intended to diagnose MRSA infection nor to guide or monitor treatment for MRSA infections. Performed at [facility], [address], [place], [place] [address]   Resp Panel by RT-PCR (Flu A&B, Covid) Nasopharyngeal Swab     Status: None   Collection Time: 05/05/20  9:08 AM   Specimen: Nasopharyngeal Swab; Nasopharyngeal(NP) swabs in vial transport medium  Result Value Ref Range Status   SARS Coronavirus 2 by RT PCR NEGATIVE NEGATIVE Final    Comment: (NOTE) SARS-CoV-2 target nucleic acids are NOT DETECTED.  The SARS-CoV-2 RNA is generally detectable in upper respiratory specimens during the acute phase of infection. The lowest concentration of SARS-CoV-2  viral copies this assay can detect is 138 copies/mL. A negative result does not preclude SARS-Cov-2 infection and should not be used as the sole basis for treatment or other patient management decisions. A negative result may occur with  improper specimen collection/handling, submission of specimen other than nasopharyngeal swab, presence of viral mutation(s) within the areas targeted by this assay, and inadequate number of viral copies(<138 copies/mL). A negative result must be combined with clinical observations, patient history, and epidemiological information. The expected result is Negative.  Fact Sheet for Patients:  BloggerCourse.com  Fact Sheet for Healthcare Providers:  SeriousBroker.it  This test is no t yet approved or cleared by the [place] FDA and  has been authorized for detection and/or diagnosis of SARS-CoV-2 by FDA under an Emergency Use Authorization (EUA). This EUA will remain  in effect (meaning this test can be used) for the duration of the COVID-19 declaration under Section 564(b)(1) of the Act, 21 U.S.C.section 360bbb-3(b)(1), unless the authorization is terminated  or revoked sooner.       Influenza A by PCR NEGATIVE NEGATIVE Final   Influenza B by PCR NEGATIVE NEGATIVE Final    Comment: (NOTE) The Xpert Xpress SARS-CoV-2/FLU/RSV plus assay is intended as an aid in the diagnosis of influenza from Nasopharyngeal swab specimens and should not be used as a sole basis for treatment. Nasal washings and aspirates are unacceptable for Xpert Xpress SARS-CoV-2/FLU/RSV testing.  Fact Sheet for Patients: BloggerCourse.com  Fact Sheet for Healthcare Providers: SeriousBroker.it  This test is not yet approved or cleared by the [place] FDA and has been authorized for detection and/or diagnosis of SARS-CoV-2 by FDA under an Emergency Use Authorization  (EUA). This EUA will remain in effect (meaning this test can be used) for the duration of the  COVID-19 declaration under Section 564(b)(1) of the Act, 21 U.S.C. section 360bbb-3(b)(1), unless the authorization is terminated or revoked.  Performed at [facility], [address] [place], [place] [address]      Radiology Studies: No results found. Scheduled Meds: . enoxaparin (LOVENOX) injection  40 mg Subcutaneous Q24H  . feeding supplement  237 mL Oral BID BM  . fludrocortisone  0.1 mg Oral QHS  . insulin aspart  0-15 Units Subcutaneous TID WC  . insulin aspart  0-5 Units Subcutaneous QHS  . insulin aspart  3 Units Subcutaneous TID WC  . insulin glargine  30 Units Subcutaneous QHS  . levothyroxine  100 mcg Oral Daily  . memantine  10 mg Oral Q12H  . multivitamin with minerals  1 tablet Oral Daily  . predniSONE  5 mg Oral Q breakfast  . QUEtiapine  25 mg Oral QHS  . rosuvastatin  5 mg Oral Daily  . sertraline  125 mg Oral Daily  . tacrolimus  0.5 mg Oral Daily   Continuous Infusions: . cefTRIAXone (ROCEPHIN)  IV 1 g (05/05/20 1122)     LOS: 4 days    Time spent: 25 mins.    Cipriano Bunker, MD [facility]   If 7PM-7AM, please contact night-coverage

## 2020-05-05 NOTE — Plan of Care (Signed)
  Problem: Education: Goal: Knowledge of General Education information will improve Description: Including pain rating scale, medication(s)/side effects and non-pharmacologic comfort measures Outcome: Progressing   Problem: Clinical Measurements: Goal: Will remain free from infection Outcome: Progressing Goal: Diagnostic test results will improve Outcome: Progressing   Problem: Activity: Goal: Risk for activity intolerance will decrease Outcome: Progressing   Problem: Elimination: Goal: Will not experience complications related to bowel motility Outcome: Progressing Goal: Will not experience complications related to urinary retention Outcome: Progressing   Problem: Pain Managment: Goal: General experience of comfort will improve Outcome: Progressing   Problem: Safety: Goal: Ability to remain free from injury will improve Outcome: Progressing   Problem: Skin Integrity: Goal: Risk for impaired skin integrity will decrease Outcome: Progressing

## 2020-05-06 ENCOUNTER — Encounter (HOSPITAL_COMMUNITY): Payer: Self-pay | Admitting: Internal Medicine

## 2020-05-06 DIAGNOSIS — N3000 Acute cystitis without hematuria: Secondary | ICD-10-CM

## 2020-05-06 LAB — BASIC METABOLIC PANEL
Anion gap: 9 (ref 5–15)
BUN: 17 mg/dL (ref 8–23)
CO2: 28 mmol/L (ref 22–32)
Calcium: 9.7 mg/dL (ref 8.9–10.3)
Chloride: 107 mmol/L (ref 98–111)
Creatinine, Ser: 0.8 mg/dL (ref 0.44–1.00)
GFR, Estimated: >60 mL/min (ref >60)
Glucose, Bld: 208 mg/dL — ABNORMAL HIGH (ref 70–99)
Potassium: 4.3 mmol/L (ref 3.5–5.1)
Sodium: 144 mmol/L (ref 135–145)

## 2020-05-06 LAB — GLUCOSE, CAPILLARY
Glucose-Capillary: 198 mg/dL — ABNORMAL HIGH (ref 70–99)
Glucose-Capillary: 176 mg/dL — ABNORMAL HIGH (ref 70–99)
Glucose-Capillary: 220 mg/dL — ABNORMAL HIGH (ref 70–99)

## 2020-05-06 MED ORDER — CEPHALEXIN 500 MG PO CAPS: 500.0000 mg | ORAL_CAPSULE | Freq: Four times a day (QID) | ORAL | 0 refills | Status: AC

## 2020-05-06 NOTE — TOC Progression Note (Signed)
Transition of Care [facility]) - Progression Note    Patient Details  Name: Emma Maldonado MRN: [id number] Date of Birth: January 21, 1950  [facility]) CM/SW Contact  Geni Bers, RN Phone Number: 05/06/2020, 1:55 PM  Clinical Narrative:     A call was made to [facility], however facility do not want pt coming back late. A call to pt's husband was made who asked that his daughter be called at [phone]. Daughter and spouse agreed with pt returning to [facility].   Expected Discharge Plan: Assisted Living [facility]) Barriers to Discharge: No Barriers Identified  Expected Discharge Plan and Services Expected Discharge Plan: Assisted Living [facility])       Living arrangements for the past 2 months: Assisted Living Facility [facility]) Expected Discharge Date: 05/06/20                                     Social Determinants of Health (SDOH) Interventions    Readmission Risk Interventions No flowsheet data found.

## 2020-05-06 NOTE — Discharge Summary (Signed)
Physician Discharge Summary  Makella Buckingham [facility] YKD:[id number] DOB: 1950/07/17 DOA: 05/01/2020  PCP: Patient, No Pcp Per (Inactive)  Admit date: 05/01/2020 Discharge date: 05/06/2020  Admitted From: Memory care Disposition:  Memory care   Recommendations for Outpatient Follow-up:  Follow up with PCP Follow up with Nephrology   Discharge Condition: Stable CODE STATUS: Full  Diet recommendation:  Diet Orders (From admission, onward)    Start     Ordered   05/03/20 1141  Diet Carb Modified Fluid consistency: Thin; Room service appropriate? Yes  Diet effective now       Question Answer Comment  Diet-HS Snack? Nothing   Calorie Level Medium 1600-2000   Fluid consistency: Thin   Room service appropriate? Yes      05/03/20 1140         Brief/Interim Summary: Emma Maldonado is a 70 years old female with PMH significant for hypertension, diabetes, renal transplant on immunosuppressants follows up with nephrologist at [facility], dementia presents in the ED for the evaluation of lower abdominal pain. Patient is poor historian, history is obtained from ED notes.  Patient was given MiraLAX by nursing home staff despite having bowel movement. Patient continued to complain of lower abdominal pain.  Patient denies any urinary symptoms, nausea, vomiting, fever.  In the ED her blood pressure was 185/ 88 other vitals were stable.  CT abdomen shows right lower quadrant renal transplant with new mild hydronephrosis and focal narrowing of the ureter near the bladder anastomosis possibly representing a stricture. There is acute left 10th and 11th ribs fractures. ED PA has spoken with nephrologist Dr. Revonda Standard at [facility] who recommended transfer to [facility] however they do not have any bed availability at this time.  Urologist was consulted at [facility], recommended no urological intervention needed at this point as patient's renal functions are stable.  Patient is admitted for UTI and right renal  hydronephrosis.  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 -UA is positive for infection.  Reviewed CT abdomen/pelvis. -Patient's kidney functions are stable -[name] consulted nephrology at [facility] who recommended transfer however no bedsare available at this time. -Urology Dr. Dahlstedt-recommended no intervention needed at this time as patient's renal function is stable. -Renal ultrasound showed transplanted kidney in the right lower quadrant is normal in appearance and contains a 1.1 cm cyst. Bladder unremarkable   E Coli UTI: De-escalate antibiotics to Keflex  Accelerated hypertension: Continue hydralazine as needed for blood pressure above 160/100 Continue losartan  Type 2 diabetes mellitus: HbA1c  9.7. Fasting glucose 63,  Continue insulin   Rib fractures: Noted on CT abdomen/pelvis. Patient is asymptomatic.  She is maintaining oxygen saturation on room air. No trauma reported  Dementia: Continue Namenda, Seroquel and Zoloft  Hyperlipidemia: Continue statin  History of renal transplant:  Continue tacrolimus and prednisone.  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 DTI left ankle. (Active)  05/01/20 1500   Location: Ankle  Location Orientation: Anterior;Left;Lateral  Staging: Deep Tissue Pressure Injury - Purple or maroon localized area of discolored intact skin or blood-filled blister due to damage of underlying soft tissue from pressure and/or shear.  Wound Description (Comments): DTI left ankle.  Present on Admission: Yes    Nutrition Problem: Increased nutrient needs Etiology: acute illness  Discharge Instructions  Discharge Instructions    Increase activity slowly   Complete by: As directed    No wound care   Complete by: As directed      Allergies as of 05/06/2020   No Known Allergies     Medication List    TAKE these medications   acetaminophen 500 MG tablet Commonly known as: TYLENOL Take 500 mg by mouth every 6 (six) hours as needed for fever or headache (pain, discomfort).   alum & mag hydroxide-simeth 200-200-20 MG/5ML suspension Commonly known as: MAALOX/MYLANTA Take 30 mLs by mouth every 6 (six) hours as needed for indigestion or heartburn.   cephALEXin 500 MG capsule Commonly known as: KEFLEX Take 1 capsule (500 mg total) by mouth 4 (four) times daily for 3 days.   cholecalciferol 1000 units tablet Commonly known as: VITAMIN D Take 2,000 Units by mouth daily.   diphenoxylate-atropine 2.5-0.025 MG tablet Commonly known as: LOMOTIL Take 1 tablet by mouth daily.   fludrocortisone 0.1 MG tablet Commonly known as: FLORINEF Take 0.1 mg by mouth at bedtime.   guaifenesin 100 MG/5ML syrup Commonly known as: ROBITUSSIN Take 200 mg by mouth every 6 (six) hours as needed for cough.   insulin aspart 100 UNIT/ML injection Commonly known as: novoLOG Inject 22 Units into the skin 3 (three) times daily with meals.   insulin glargine 100 UNIT/ML injection Commonly known as: LANTUS Inject 10-50 Units into the skin every evening. Inject 10 units in the morning and 50 units at night.   levothyroxine 100 MCG tablet Commonly known as: SYNTHROID Take 100 mcg by  mouth daily.   loperamide 2 MG capsule Commonly known as: IMODIUM Take 2 mg by mouth as needed for diarrhea or loose stools.   losartan 25 MG tablet Commonly known as: COZAAR Take 25 mg by mouth daily.   magnesium hydroxide 400 MG/5ML suspension Commonly known as: MILK OF MAGNESIA Take 30 mLs by mouth at bedtime as needed for mild constipation.   magnesium oxide 400 MG tablet Commonly known as: MAG-OX Take 400 mg by mouth daily.   memantine 10 MG tablet Commonly known as: NAMENDA Take 10 mg by mouth every 12 (twelve) hours.   neomycin-bacitracin-polymyxin ointment Commonly known as: NEOSPORIN Apply 1 application topically daily as needed for wound care.   predniSONE 5 MG tablet Commonly known as: DELTASONE Take 5 mg by mouth every morning.   QUEtiapine 25 MG tablet Commonly known as: SEROQUEL Take 25 mg by mouth at bedtime.   rosuvastatin 5 MG tablet Commonly known as: CRESTOR Take 5 mg by mouth daily.   sertraline 50 MG tablet Commonly known as: ZOLOFT Take 125 mg by mouth daily. Taking 2.5 tablets = 125 mg daily   tacrolimus 0.5 MG capsule Commonly known as: PROGRAF Take 1 capsule (0.5 mg total) by mouth daily.       Follow-up Information    Ovidio Kin, MD. Schedule an appointment as soon as possible for a visit in 1 week(s).   Specialty: Internal Medicine Contact information: [facility]  [place] [address] [phone]              No Known Allergies  Procedures/Studies: CT Abdomen Pelvis W Contrast  Result Date: 05/01/2020 CLINICAL DATA:  Left-sided abdominal pain. EXAM: CT ABDOMEN AND PELVIS WITH CONTRAST TECHNIQUE: Multidetector CT imaging of the abdomen and pelvis was performed using the standard protocol following bolus administration of intravenous contrast. CONTRAST:  OMNIPAQUE IOHEXOL 300 MG/ML  SOLN COMPARISON:  CT abdomen pelvis dated May 07, 2019. FINDINGS: Lower chest: No acute abnormality. Unchanged bibasilar  atelectasis/scarring. Hepatobiliary: No focal liver abnormality. Status post cholecystectomy. Unchanged moderate intrahepatic biliary and common bile duct dilatation, likely due to reservoir effect. Pancreas: Unchanged atrophy. No ductal dilatation or surrounding inflammatory changes. Spleen: Normal in size without focal abnormality. Adrenals/Urinary Tract: The adrenal glands are unremarkable. Bilateral native kidneys remain atrophic. Right lower quadrant renal transplant again noted with a few small cysts. New mild hydronephrosis with focal narrowing of the ureter near the bladder anastomosis (series 2, image 78). No calculi. The bladder is unremarkable. Stomach/Bowel: Stomach is within normal limits. Appendix appears normal. No evidence of bowel wall thickening, distention, or inflammatory changes. Left-sided colonic diverticulosis. Vascular/Lymphatic: Aortic atherosclerosis. No enlarged abdominal or pelvic lymph nodes. Reproductive: Status post hysterectomy. No adnexal masses. Other: No free fluid or pneumoperitoneum. Similar chronic scarring in the anterior abdominal wall. Musculoskeletal: Acute left posterior tenth and eleventh rib fractures. Subacute to chronic nondisplaced right posterior eleventh rib fracture, new since April 2021. IMPRESSION: 1. Right lower quadrant renal transplant with new mild hydronephrosis and focal narrowing of the ureter near the bladder anastomosis, possibly representing a stricture. 2. Acute left posterior tenth and eleventh rib fractures. Subacute to chronic nondisplaced right posterior eleventh rib fracture, new since April 2021. 3. Aortic Atherosclerosis (ICD10-I70.0). Electronically Signed   By: Obie Dredge M.D.   On: 05/01/2020 11:22   US RENAL  Result Date: 05/05/2020 CLINICAL DATA:  Evaluate for hydronephrosis. EXAM: RENAL / URINARY TRACT ULTRASOUND COMPLETE COMPARISON:  None. FINDINGS: Only limited images were obtained of the patient's native right and left kidneys  with no gross abnormalities. The transplanted kidney in the right lower quadrant measures 12.4 x 7.0 x 6.3 cm with a volume of 287 cc. A 1.1 cm cyst is identified in the lateral midpole. No hydronephrosis. The bladder is unremarkable. IMPRESSION: 1. Only limited imaging was obtained of the native kidneys without gross abnormalities. 2. The transplanted kidney in the right lower quadrant is normal in appearance and contains a 1.1 cm cyst. Electronically Signed   By: Gerome Sam III M.D   On: 05/05/2020 17:59       Discharge Exam: Vitals:   05/05/20 2208 05/06/20 0522  BP: (!) 178/68 (!) 172/77  Pulse: 71 73  Resp:  (!) 21  Temp:  97.9 F (36.6 C)  SpO2: 93% 95%    General: Pt is alert, awake, not in acute distress Cardiovascular: RRR, S1/S2 +, no edema Respiratory: CTA bilaterally, no wheezing, no rhonchi, no respiratory distress, no conversational dyspnea  Abdominal: Soft, NT, ND, bowel sounds + Extremities: no edema, no cyanosis Psych: Normal mood and affect, oriented to self only   The results of significant diagnostics from this hospitalization (including imaging, microbiology, ancillary and laboratory) are listed below for reference.     Microbiology: Recent Results (from the past 240 hour(s))  Urine culture     Status: Abnormal   Collection Time: 05/01/20  1:03 PM   Specimen: Urine, Random  Result Value  Ref Range Status   Specimen Description   Final    URINE, RANDOM Performed at [facility], [address], [place], [place] [address]    Special Requests   Final    NONE Performed at [facility], [address] [place], [place] [address]    Culture >=100,000 COLONIES/mL ESCHERICHIA COLI (A)  Final   Report Status 05/04/2020 FINAL  Final   Organism ID, Bacteria ESCHERICHIA COLI (A)  Final      Susceptibility   Escherichia coli - MIC*    AMPICILLIN 8 SENSITIVE Sensitive     CEFAZOLIN <=4 SENSITIVE Sensitive     CEFEPIME <=0.12  SENSITIVE Sensitive     CEFTRIAXONE <=0.25 SENSITIVE Sensitive     CIPROFLOXACIN <=0.25 SENSITIVE Sensitive     GENTAMICIN <=1 SENSITIVE Sensitive     IMIPENEM <=0.25 SENSITIVE Sensitive     NITROFURANTOIN <=16 SENSITIVE Sensitive     TRIMETH/SULFA <=20 SENSITIVE Sensitive     AMPICILLIN/SULBACTAM 4 SENSITIVE Sensitive     PIP/TAZO <=4 SENSITIVE Sensitive     * >=100,000 COLONIES/mL ESCHERICHIA COLI  SARS CORONAVIRUS 2 (TAT 6-24 HRS) Nasopharyngeal Nasopharyngeal Swab     Status: None   Collection Time: 05/01/20  6:40 PM   Specimen: Nasopharyngeal Swab  Result Value Ref Range Status   SARS Coronavirus 2 NEGATIVE NEGATIVE Final    Comment: (NOTE) SARS-CoV-2 target nucleic acids are NOT DETECTED.  The SARS-CoV-2 RNA is generally detectable in upper and lower respiratory specimens during the acute phase of infection. Negative results do not preclude SARS-CoV-2 infection, do not rule out co-infections with other pathogens, and should not be used as the sole basis for treatment or other patient management decisions. Negative results must be combined with clinical observations, patient history, and epidemiological information. The expected result is Negative.  Fact Sheet for Patients: HairSlick.no  Fact Sheet for Healthcare Providers: quierodirigir.com  This test is not yet approved or cleared by the [place] FDA and  has been authorized for detection and/or diagnosis of SARS-CoV-2 by FDA under an Emergency Use Authorization (EUA). This EUA will remain  in effect (meaning this test can be used) for the duration of the COVID-19 declaration under Se ction 564(b)(1) of the Act, 21 U.S.C. section 360bbb-3(b)(1), unless the authorization is terminated or revoked sooner.  Performed at [facility], [address] [place], [place] [address]   MRSA PCR Screening     Status: None   Collection Time: 05/01/20  6:40 PM    Specimen: Nasopharyngeal  Result Value Ref Range Status   MRSA by PCR NEGATIVE NEGATIVE Final    Comment:        The GeneXpert MRSA Assay (FDA approved for NASAL specimens only), is one component of a comprehensive MRSA colonization surveillance program. It is not intended to diagnose MRSA infection nor to guide or monitor treatment for MRSA infections. Performed at [facility], [address] [place], [place] [address]   Resp Panel by RT-PCR (Flu A&B, Covid) Nasopharyngeal Swab     Status: None   Collection Time: 05/05/20  9:08 AM   Specimen: Nasopharyngeal Swab; Nasopharyngeal(NP) swabs in vial transport medium  Result Value Ref Range Status   SARS Coronavirus 2 by RT PCR NEGATIVE NEGATIVE Final    Comment: (NOTE) SARS-CoV-2 target nucleic acids are NOT DETECTED.  The SARS-CoV-2 RNA is generally detectable in upper respiratory specimens during the acute phase of infection. The lowest concentration of SARS-CoV-2 viral  copies this assay can detect is 138 copies/mL. A negative result does not preclude SARS-Cov-2 infection and should not be used as the sole basis for treatment or other patient management decisions. A negative result may occur with  improper specimen collection/handling, submission of specimen other than nasopharyngeal swab, presence of viral mutation(s) within the areas targeted by this assay, and inadequate number of viral copies(<138 copies/mL). A negative result must be combined with clinical observations, patient history, and epidemiological information. The expected result is Negative.  Fact Sheet for Patients:  BloggerCourse.comhttps://www.fda.gov/media/152166/download  Fact Sheet for Healthcare Providers:  SeriousBroker.ithttps://www.fda.gov/media/152162/download  This test is no t yet approved or cleared by the [place] FDA and  has been authorized for detection and/or diagnosis of SARS-CoV-2 by FDA under an Emergency Use Authorization (EUA). This EUA will  remain  in effect (meaning this test can be used) for the duration of the COVID-19 declaration under Section 564(b)(1) of the Act, 21 U.S.C.section 360bbb-3(b)(1), unless the authorization is terminated  or revoked sooner.       Influenza A by PCR NEGATIVE NEGATIVE Final   Influenza B by PCR NEGATIVE NEGATIVE Final    Comment: (NOTE) The Xpert Xpress SARS-CoV-2/FLU/RSV plus assay is intended as an aid in the diagnosis of influenza from Nasopharyngeal swab specimens and should not be used as a sole basis for treatment. Nasal washings and aspirates are unacceptable for Xpert Xpress SARS-CoV-2/FLU/RSV testing.  Fact Sheet for Patients: BloggerCourse.comhttps://www.fda.gov/media/152166/download  Fact Sheet for Healthcare Providers: SeriousBroker.ithttps://www.fda.gov/media/152162/download  This test is not yet approved or cleared by the [place] FDA and has been authorized for detection and/or diagnosis of SARS-CoV-2 by FDA under an Emergency Use Authorization (EUA). This EUA will remain in effect (meaning this test can be used) for the duration of the COVID-19 declaration under Section 564(b)(1) of the Act, 21 U.S.C. section 360bbb-3(b)(1), unless the authorization is terminated or revoked.  Performed at [facility], [address] [place], [place] [id number]      Labs: BNP (last 3 results) Recent Labs    03/27/20 0149 03/28/20 0241 03/29/20 0210  BNP 174.8* 124.5* 110.3*   Basic Metabolic Panel: Recent Labs  Lab 05/01/20 1732 05/02/20 0408 05/03/20 0348 05/04/20 0317 05/05/20 0402 05/06/20 1129  NA  --  145 142 141 145 144  K  --  3.8 3.9 4.0 4.3 4.3  CL  --  109 107 101 106 107  CO2  --  29 26 29 30 28   GLUCOSE  --  79 63* 179* 161* 208*  BUN  --  13 12 16 16 17   CREATININE 0.79 0.88 0.65 0.81 0.66 0.80  CALCIUM  --  8.8* 9.4 9.6 9.4 9.7  MG 1.8  --  1.6* 1.6* 1.7  --   PHOS  --   --  4.4 3.7 2.8  --    Liver Function Tests: Recent Labs  Lab 05/01/20 0931  05/02/20 0408  AST 17 97*  ALT 16 39  ALKPHOS 65 114  BILITOT 0.9 1.1  PROT 6.4* 5.9*  ALBUMIN 3.2* 3.1*   Recent Labs  Lab 05/01/20 0931  LIPASE 20   No results for input(s): AMMONIA in the last 168 hours. CBC: Recent Labs  Lab 05/01/20 0931 05/01/20 1732 05/02/20 0408 05/03/20 0348 05/05/20 0402  WBC 8.0 9.5 8.2 7.8  --   NEUTROABS 6.1  --   --   --   --   HGB 13.0 14.6 12.1 12.8 13.4  HCT 41.6 46.3*  39.1 40.6 42.1  MCV 81.6 81.5 82.7 81.7  --   PLT 188 210 177 206  --    Cardiac Enzymes: No results for input(s): CKTOTAL, CKMB, CKMBINDEX, TROPONINI in the last 168 hours. BNP: Invalid input(s): POCBNP CBG: Recent Labs  Lab 05/05/20 1128 05/05/20 1621 05/05/20 2105 05/06/20 0735 05/06/20 1143  GLUCAP 296* 200* 219* 198* 176*   D-Dimer No results for input(s): DDIMER in the last 72 hours. Hgb A1c No results for input(s): HGBA1C in the last 72 hours. Lipid Profile No results for input(s): CHOL, HDL, LDLCALC, TRIG, CHOLHDL, LDLDIRECT in the last 72 hours. Thyroid function studies No results for input(s): TSH, T4TOTAL, T3FREE, THYROIDAB in the last 72 hours.  Invalid input(s): FREET3 Anemia work up No results for input(s): VITAMINB12, FOLATE, FERRITIN, TIBC, IRON, RETICCTPCT in the last 72 hours. Urinalysis    Component Value Date/Time   COLORURINE YELLOW 05/01/2020 1144   APPEARANCEUR CLOUDY (A) 05/01/2020 1144   LABSPEC 1.013 05/01/2020 1144   PHURINE 6.0 05/01/2020 1144   GLUCOSEU >=500 (A) 05/01/2020 1144   HGBUR SMALL (A) 05/01/2020 1144   BILIRUBINUR NEGATIVE 05/01/2020 1144   KETONESUR NEGATIVE 05/01/2020 1144   PROTEINUR NEGATIVE 05/01/2020 1144   UROBILINOGEN 1.0 08/14/2012 1022   NITRITE POSITIVE (A) 05/01/2020 1144   LEUKOCYTESUR LARGE (A) 05/01/2020 1144   Sepsis Labs Invalid input(s): PROCALCITONIN,  WBC,  LACTICIDVEN Microbiology Recent Results (from the past 240 hour(s))  Urine culture     Status: Abnormal   Collection Time:  05/01/20  1:03 PM   Specimen: Urine, Random  Result Value Ref Range Status   Specimen Description   Final    URINE, RANDOM Performed at [facility], [address] [place], [place] [address]    Special Requests   Final    NONE Performed at [facility], [address] [place], [place] [address]    Culture >=100,000 COLONIES/mL ESCHERICHIA COLI (A)  Final   Report Status 05/04/2020 FINAL  Final   Organism ID, Bacteria ESCHERICHIA COLI (A)  Final      Susceptibility   Escherichia coli - MIC*    AMPICILLIN 8 SENSITIVE Sensitive     CEFAZOLIN <=4 SENSITIVE Sensitive     CEFEPIME <=0.12 SENSITIVE Sensitive     CEFTRIAXONE <=0.25 SENSITIVE Sensitive     CIPROFLOXACIN <=0.25 SENSITIVE Sensitive     GENTAMICIN <=1 SENSITIVE Sensitive     IMIPENEM <=0.25 SENSITIVE Sensitive     NITROFURANTOIN <=16 SENSITIVE Sensitive     TRIMETH/SULFA <=20 SENSITIVE Sensitive     AMPICILLIN/SULBACTAM 4 SENSITIVE Sensitive     PIP/TAZO <=4 SENSITIVE Sensitive     * >=100,000 COLONIES/mL ESCHERICHIA COLI  SARS CORONAVIRUS 2 (TAT 6-24 HRS) Nasopharyngeal Nasopharyngeal Swab     Status: None   Collection Time: 05/01/20  6:40 PM   Specimen: Nasopharyngeal Swab  Result Value Ref Range Status   SARS Coronavirus 2 NEGATIVE NEGATIVE Final    Comment: (NOTE) SARS-CoV-2 target nucleic acids are NOT DETECTED.  The SARS-CoV-2 RNA is generally detectable in upper and lower respiratory specimens during the acute phase of infection. Negative results do not preclude SARS-CoV-2 infection, do not rule out co-infections with other pathogens, and should not be used as the sole basis for treatment or other patient management decisions. Negative results must be combined with clinical observations, patient history, and epidemiological information. The expected result is Negative.  Fact Sheet for Patients: HairSlick.no  Fact Sheet for Healthcare  Providers: quierodirigir.com  This test is not yet approved or cleared by the [place] and  has been authorized for detection and/or diagnosis of SARS-CoV-2 by FDA under an Emergency Use Authorization (EUA). This EUA will remain  in effect (meaning this test can be used) for the duration of the COVID-19 declaration under Se ction 564(b)(1) of the Act, 21 U.S.C. section 360bbb-3(b)(1), unless the authorization is terminated or revoked sooner.  Performed at [facility], [address], [place], [place] [address]   MRSA PCR Screening     Status: None   Collection Time: 05/01/20  6:40 PM   Specimen: Nasopharyngeal  Result Value Ref Range Status   MRSA by PCR NEGATIVE NEGATIVE Final    Comment:        The GeneXpert MRSA Assay (FDA approved for NASAL specimens only), is one component of a comprehensive MRSA colonization surveillance program. It is not intended to diagnose MRSA infection nor to guide or monitor treatment for MRSA infections. Performed at [facility], [address] [place], [place] [address]   Resp Panel by RT-PCR (Flu A&B, Covid) Nasopharyngeal Swab     Status: None   Collection Time: 05/05/20  9:08 AM   Specimen: Nasopharyngeal Swab; Nasopharyngeal(NP) swabs in vial transport medium  Result Value Ref Range Status   SARS Coronavirus 2 by RT PCR NEGATIVE NEGATIVE Final    Comment: (NOTE) SARS-CoV-2 target nucleic acids are NOT DETECTED.  The SARS-CoV-2 RNA is generally detectable in upper respiratory specimens during the acute phase of infection. The lowest concentration of SARS-CoV-2 viral copies this assay can detect is 138 copies/mL. A negative result does not preclude SARS-Cov-2 infection and should not be used as the sole basis for treatment or other patient management decisions. A negative result may occur with  improper specimen collection/handling, submission of specimen other than  nasopharyngeal swab, presence of viral mutation(s) within the areas targeted by this assay, and inadequate number of viral copies(<138 copies/mL). A negative result must be combined with clinical observations, patient history, and epidemiological information. The expected result is Negative.  Fact Sheet for Patients:  BloggerCourse.com  Fact Sheet for Healthcare Providers:  SeriousBroker.it  This test is no t yet approved or cleared by the [place] FDA and  has been authorized for detection and/or diagnosis of SARS-CoV-2 by FDA under an Emergency Use Authorization (EUA). This EUA will remain  in effect (meaning this test can be used) for the duration of the COVID-19 declaration under Section 564(b)(1) of the Act, 21 U.S.C.section 360bbb-3(b)(1), unless the authorization is terminated  or revoked sooner.       Influenza A by PCR NEGATIVE NEGATIVE Final   Influenza B by PCR NEGATIVE NEGATIVE Final    Comment: (NOTE) The Xpert Xpress SARS-CoV-2/FLU/RSV plus assay is intended as an aid in the diagnosis of influenza from Nasopharyngeal swab specimens and should not be used as a sole basis for treatment. Nasal washings and aspirates are unacceptable for Xpert Xpress SARS-CoV-2/FLU/RSV testing.  Fact Sheet for Patients: BloggerCourse.com  Fact Sheet for Healthcare Providers: SeriousBroker.it  This test is not yet approved or cleared by the [place] FDA and has been authorized for detection and/or diagnosis of SARS-CoV-2 by FDA under an Emergency Use Authorization (EUA). This EUA will remain in effect (meaning this test can be used) for the duration of the COVID-19 declaration under Section 564(b)(1) of the Act, 21 U.S.C. section 360bbb-3(b)(1), unless the authorization is terminated or revoked.  Performed at [employer]  [facility], [address], [place], [place] [address]      Patient was seen and examined on the day of discharge and was found to be in stable condition. Time coordinating discharge: 35 minutes including assessment and coordination of care, as well as examination of the patient.   SIGNED:  Noralee Stain, DO [facility] 05/06/2020, 12:32 PM

## 2020-05-06 NOTE — Progress Notes (Signed)
Patient discharging back to [facility].  Report called and given to Arline Asp, RN at facility.  AVS placed in packet.  IV removed-WNL.  Patient in NAD waiting for PTAR

## 2020-05-06 NOTE — Care Management Important Message (Signed)
Important Message  Patient Details IM Letter given to the Patient. Name: Emma Maldonado MRN: [id number] Date of Birth: 1950-08-26   Medicare Important Message Given:  Yes     [name] 05/06/2020, 11:37 AM

## 2020-05-06 NOTE — NC FL2 (Addendum)
[place] MEDICAID FL2 LEVEL OF CARE SCREENING TOOL     IDENTIFICATION  Patient Name: Emma Maldonado Birthdate: 30-Jun-1950 Sex: female Admission Date (Current Location): 05/01/2020  [facility] and [place] Number:  Producer, television/film/video and Address:  [facility],  [address], [place] [address]      Provider Number: [id number]  Attending Physician Name and Address:  Noralee Stain, DO  Relative Name and Phone Number:  [name] Daughter  [phone],  Uriostegui,Paul Spouse   [phone]    Current Level of Care: Hospital Recommended Level of Care: Assisted Living [facility] Care Prior Approval Number:    Date Approved/Denied:   PASRR Number:    Discharge Plan: Other (Comment) (ALF/Memory Care)    Current Diagnoses: Patient Active Problem List   Diagnosis Date Noted  . Hydronephrosis of right kidney 05/01/2020  . Rib fractures 05/01/2020  . UTI (urinary tract infection) 03/25/2020  . Diabetes mellitus type 2, uncomplicated (HCC) 03/25/2020  . Hypokalemia 03/25/2020  . Essential hypertension 03/25/2020  . Dementia (HCC) 03/25/2020  . Altered mental status 03/25/2020  . Uncontrolled type 2 diabetes mellitus with hyperglycemia (HCC) 03/25/2020  . Accelerated hypertension 03/28/2012  . Viral gastroenteritis 03/28/2012  . Sepsis (HCC) 03/26/2012  . History of renal transplant 03/26/2012  . Diabetes mellitus (HCC) 03/26/2012  . Hyperlipidemia 03/26/2012    Orientation RESPIRATION BLADDER Height & Weight     Self,Place  Normal Continent Weight: 57.9 kg Height:  5\' 2"  (157.5 cm)  BEHAVIORAL SYMPTOMS/MOOD NEUROLOGICAL BOWEL NUTRITION STATUS      Incontinent Diet  AMBULATORY STATUS COMMUNICATION OF NEEDS Skin   Limited Assist Verbally Normal                       Personal Care Assistance Level of Assistance  Bathing,Feeding,Dressing Bathing Assistance: Limited assistance Feeding assistance: Independent Dressing Assistance: Limited  assistance     Functional Limitations Info  Sight,Hearing,Speech Sight Info: Impaired (Wear glasses) Hearing Info: Impaired (HOH) Speech Info: Adequate    SPECIAL CARE FACTORS FREQUENCY  PT (By licensed PT),OT (By licensed OT)     PT Frequency: Eval and Treat OT Frequency: Eval and Treat            Contractures Contractures Info: Not present    Additional Factors Info  Code Status,Allergies Code Status Info: FULL Allergies Info: No Known Allergies           Current Medications (05/06/2020):  This is the current hospital active medication list Current Facility-Administered Medications  Medication Dose Route Frequency Provider Last Rate Last Admin  . acetaminophen (TYLENOL) tablet 650 mg  650 mg Oral Q6H PRN Pahwani, Rinka R, MD   650 mg at 05/05/20 1324   Or  . acetaminophen (TYLENOL) suppository 650 mg  650 mg Rectal Q6H PRN Pahwani, Rinka R, MD      . cefTRIAXone (ROCEPHIN) 1 g in sodium chloride 0.9 % 100 mL IVPB  1 g Intravenous Q24H 05/07/20, MD 200 mL/hr at 05/06/20 1257 1 g at 05/06/20 1257  . enoxaparin (LOVENOX) injection 40 mg  40 mg Subcutaneous Q24H Pahwani, Rinka R, MD   40 mg at 05/05/20 2210  . feeding supplement (ENSURE ENLIVE / ENSURE PLUS) liquid 237 mL  237 mL Oral BID BM Pahwani, Rinka R, MD   237 mL at 05/05/20 1023  . fludrocortisone (FLORINEF) tablet 0.1 mg  0.1 mg Oral QHS Pahwani, Rinka R, MD   0.1 mg at 05/05/20 2210  .  hydrALAZINE (APRESOLINE) injection 10 mg  10 mg Intravenous Q6H PRN Pahwani, Rinka R, MD      . HYDROmorphone (DILAUDID) injection 0.5-1 mg  0.5-1 mg Intravenous Q2H PRN Pahwani, Rinka R, MD      . insulin aspart (novoLOG) injection 0-15 Units  0-15 Units Subcutaneous TID WC Pahwani, Rinka R, MD   3 Units at 05/06/20 1255  . insulin aspart (novoLOG) injection 0-5 Units  0-5 Units Subcutaneous QHS Pahwani, Rinka R, MD   2 Units at 05/05/20 2211  . insulin aspart (novoLOG) injection 3 Units  3 Units Subcutaneous TID WC Cipriano Bunker, MD   3 Units at 05/06/20 1255  . insulin glargine (LANTUS) injection 30 Units  30 Units Subcutaneous QHS Cipriano Bunker, MD   30 Units at 05/05/20 2211  . levothyroxine (SYNTHROID) tablet 100 mcg  100 mcg Oral Daily Pahwani, Rinka R, MD   100 mcg at 05/06/20 0623  . losartan (COZAAR) tablet 25 mg  25 mg Oral Daily Cipriano Bunker, MD   25 mg at 05/06/20 0815  . memantine (NAMENDA) tablet 10 mg  10 mg Oral Q12H Pahwani, Rinka R, MD   10 mg at 05/06/20 0815  . multivitamin with minerals tablet 1 tablet  1 tablet Oral Daily Cipriano Bunker, MD   1 tablet at 05/06/20 0815  . ondansetron (ZOFRAN) tablet 4 mg  4 mg Oral Q6H PRN Pahwani, Rinka R, MD       Or  . ondansetron (ZOFRAN) injection 4 mg  4 mg Intravenous Q6H PRN Pahwani, Rinka R, MD      . oxyCODONE (Oxy IR/ROXICODONE) immediate release tablet 5 mg  5 mg Oral Q4H PRN Pahwani, Rinka R, MD   5 mg at 05/04/20 1427  . predniSONE (DELTASONE) tablet 5 mg  5 mg Oral Q breakfast Pahwani, Rinka R, MD   5 mg at 05/06/20 0815  . QUEtiapine (SEROQUEL) tablet 25 mg  25 mg Oral QHS Pahwani, Rinka R, MD   25 mg at 05/05/20 2210  . rosuvastatin (CRESTOR) tablet 5 mg  5 mg Oral Daily Pahwani, Rinka R, MD   5 mg at 05/06/20 0815  . sertraline (ZOLOFT) tablet 125 mg  125 mg Oral Daily Pahwani, Rinka R, MD   125 mg at 05/06/20 0815  . tacrolimus (PROGRAF) capsule 0.5 mg  0.5 mg Oral Daily Pahwani, Rinka R, MD   0.5 mg at 05/06/20 0815     Discharge Medications: TAKE these medications   acetaminophen 500 MG tablet Commonly known as: TYLENOL Take 500 mg by mouth every 6 (six) hours as needed for fever or headache (pain, discomfort).   alum & mag hydroxide-simeth 200-200-20 MG/5ML suspension Commonly known as: MAALOX/MYLANTA Take 30 mLs by mouth every 6 (six) hours as needed for indigestion or heartburn.   cephALEXin 500 MG capsule Commonly known as: KEFLEX Take 1 capsule (500 mg total) by mouth 4 (four) times daily for 3 days.    cholecalciferol 1000 units tablet Commonly known as: VITAMIN D Take 2,000 Units by mouth daily.   diphenoxylate-atropine 2.5-0.025 MG tablet Commonly known as: LOMOTIL Take 1 tablet by mouth daily.   fludrocortisone 0.1 MG tablet Commonly known as: FLORINEF Take 0.1 mg by mouth at bedtime.   guaifenesin 100 MG/5ML syrup Commonly known as: ROBITUSSIN Take 200 mg by mouth every 6 (six) hours as needed for cough.   insulin aspart 100 UNIT/ML injection Commonly known as: novoLOG Inject 22 Units into the skin 3 (three) times daily  with meals.   insulin glargine 100 UNIT/ML injection Commonly known as: LANTUS Inject 10-50 Units into the skin every evening. Inject 10 units in the morning and 50 units at night.   levothyroxine 100 MCG tablet Commonly known as: SYNTHROID Take 100 mcg by mouth daily.   loperamide 2 MG capsule Commonly known as: IMODIUM Take 2 mg by mouth as needed for diarrhea or loose stools.   losartan 25 MG tablet Commonly known as: COZAAR Take 25 mg by mouth daily.   magnesium hydroxide 400 MG/5ML suspension Commonly known as: MILK OF MAGNESIA Take 30 mLs by mouth at bedtime as needed for mild constipation.   magnesium oxide 400 MG tablet Commonly known as: MAG-OX Take 400 mg by mouth daily.   memantine 10 MG tablet Commonly known as: NAMENDA Take 10 mg by mouth every 12 (twelve) hours.   neomycin-bacitracin-polymyxin ointment Commonly known as: NEOSPORIN Apply 1 application topically daily as needed for wound care.   predniSONE 5 MG tablet Commonly known as: DELTASONE Take 5 mg by mouth every morning.   QUEtiapine 25 MG tablet Commonly known as: SEROQUEL Take 25 mg by mouth at bedtime.   rosuvastatin 5 MG tablet Commonly known as: CRESTOR Take 5 mg by mouth daily.   sertraline 50 MG tablet Commonly known as: ZOLOFT Take 125 mg by mouth daily. Taking 2.5 tablets = 125 mg daily   tacrolimus 0.5 MG capsule Commonly known  as: PROGRAF Take 1 capsule (0.5 mg total) by mouth daily.     Relevant Imaging Results:  Relevant Lab Results:   Additional Information QM#[id number]  Geni Bers, RN

## 2020-05-06 NOTE — Discharge Instructions (Signed)
Urinary Tract Infection, Adult A urinary tract infection (UTI) is an infection of any part of the urinary tract. The urinary tract includes:  The kidneys.  The ureters.  The bladder.  The urethra. These organs make, store, and get rid of pee (urine) in the body. What are the causes? This infection is caused by germs (bacteria) in your genital area. These germs grow and cause swelling (inflammation) of your urinary tract. What increases the risk? The following factors may make you more likely to develop this condition:  Using a small, thin tube (catheter) to drain pee.  Not being able to control when you pee or poop (incontinence).  Being female. If you are female, these things can increase the risk: ? Using these methods to prevent pregnancy:  A medicine that kills sperm (spermicide).  A device that blocks sperm (diaphragm). ? Having low levels of a female hormone (estrogen). ? Being pregnant. You are more likely to develop this condition if:  You have genes that add to your risk.  You are sexually active.  You take antibiotic medicines.  You have trouble peeing because of: ? A prostate that is bigger than normal, if you are female. ? A blockage in the part of your body that drains pee from the bladder. ? A kidney stone. ? A nerve condition that affects your bladder. ? Not getting enough to drink. ? Not peeing often enough.  You have other conditions, such as: ? Diabetes. ? A weak disease-fighting system (immune system). ? Sickle cell disease. ? Gout. ? Injury of the spine. What are the signs or symptoms? Symptoms of this condition include:  Needing to pee right away.  Peeing small amounts often.  Pain or burning when peeing.  Blood in the pee.  Pee that smells bad or not like normal.  Trouble peeing.  Pee that is cloudy.  Fluid coming from the vagina, if you are female.  Pain in the belly or lower back. Other symptoms include:  Vomiting.  Not  feeling hungry.  Feeling mixed up (confused). This may be the first symptom in older adults.  Being tired and grouchy (irritable).  A fever.  Watery poop (diarrhea). How is this treated?  Taking antibiotic medicine.  Taking other medicines.  Drinking enough water. In some cases, you may need to see a specialist. Follow these instructions at home: Medicines  Take over-the-counter and prescription medicines only as told by your doctor.  If you were prescribed an antibiotic medicine, take it as told by your doctor. Do not stop taking it even if you start to feel better. General instructions  Make sure you: ? Pee until your bladder is empty. ? Do not hold pee for a long time. ? Empty your bladder after sex. ? Wipe from front to back after peeing or pooping if you are a female. Use each tissue one time when you wipe.  Drink enough fluid to keep your pee pale yellow.  Keep all follow-up visits.   Contact a doctor if:  You do not get better after 1-2 days.  Your symptoms go away and then come back. Get help right away if:  You have very bad back pain.  You have very bad pain in your lower belly.  You have a fever.  You have chills.  You feeling like you will vomit or you vomit. Summary  A urinary tract infection (UTI) is an infection of any part of the urinary tract.  This condition is caused by   germs in your genital area.  There are many risk factors for a UTI.  Treatment includes antibiotic medicines.  Drink enough fluid to keep your pee pale yellow. This information is not intended to replace advice given to you by your health care provider. Make sure you discuss any questions you have with your health care provider. Document Revised: 08/11/2019 Document Reviewed: 08/11/2019 Elsevier Patient Education  2021 Elsevier Inc.  

## 2020-05-06 NOTE — TOC Progression Note (Signed)
Transition of Care [facility]) - Progression Note    Patient Details  Name: [name] MRN: [id number] Date of Birth: Jun 30, 1950  [facility]) CM/SW Contact  Geni Bers, RN Phone Number: 05/06/2020, 2:52 PM  Clinical Narrative:    FL2 and discharge summary fax to [facility].  PTAR was called.   Expected Discharge Plan: Assisted Living [facility]) Barriers to Discharge: No Barriers Identified  Expected Discharge Plan and Services Expected Discharge Plan: Assisted Living [facility])       Living arrangements for the past 2 months: Assisted Living Facility [facility] Memory [facility]) Expected Discharge Date: 05/06/20                                     Social Determinants of Health (SDOH) Interventions    Readmission Risk Interventions No flowsheet data found.

## 2020-08-17 ENCOUNTER — Emergency Department (HOSPITAL_COMMUNITY)
Admission: EM | Admit: 2020-08-17 | Discharge: 2020-08-17 | Disposition: A | Discharge status: Home / Self Care | Payer: Medicare Other | Attending: Emergency Medicine | Admitting: Emergency Medicine

## 2020-08-17 ENCOUNTER — Emergency Department (HOSPITAL_COMMUNITY): Payer: Medicare Other

## 2020-08-17 DIAGNOSIS — E1169 Type 2 diabetes mellitus with other specified complication: Secondary | ICD-10-CM | POA: Insufficient documentation

## 2020-08-17 DIAGNOSIS — E1165 Type 2 diabetes mellitus with hyperglycemia: Secondary | ICD-10-CM | POA: Insufficient documentation

## 2020-08-17 DIAGNOSIS — R791 Abnormal coagulation profile: Secondary | ICD-10-CM | POA: Insufficient documentation

## 2020-08-17 DIAGNOSIS — E876 Hypokalemia: Secondary | ICD-10-CM | POA: Insufficient documentation

## 2020-08-17 DIAGNOSIS — Z79899 Other long term (current) drug therapy: Secondary | ICD-10-CM | POA: Insufficient documentation

## 2020-08-17 DIAGNOSIS — E785 Hyperlipidemia, unspecified: Secondary | ICD-10-CM | POA: Insufficient documentation

## 2020-08-17 DIAGNOSIS — Z794 Long term (current) use of insulin: Secondary | ICD-10-CM | POA: Insufficient documentation

## 2020-08-17 DIAGNOSIS — F0390 Unspecified dementia without behavioral disturbance: Secondary | ICD-10-CM | POA: Insufficient documentation

## 2020-08-17 DIAGNOSIS — I10 Essential (primary) hypertension: Secondary | ICD-10-CM | POA: Diagnosis not present

## 2020-08-17 DIAGNOSIS — R030 Elevated blood-pressure reading, without diagnosis of hypertension: Secondary | ICD-10-CM | POA: Diagnosis present

## 2020-08-17 LAB — APTT: aPTT: 28 seconds (ref 24–36)

## 2020-08-17 LAB — CBC WITH DIFFERENTIAL/PLATELET
Abs Immature Granulocytes: 0.07 10*3/uL (ref 0.00–0.07)
Basophils Absolute: 0 10*3/uL (ref 0.0–0.1)
Basophils Relative: 1 %
Eosinophils Absolute: 0 10*3/uL (ref 0.0–0.5)
Eosinophils Relative: 1 %
HCT: 40 % (ref 36.0–46.0)
Hemoglobin: 12.8 g/dL (ref 12.0–15.0)
Immature Granulocytes: 2 %
Lymphocytes Relative: 34 %
Lymphs Abs: 1.4 10*3/uL (ref 0.7–4.0)
MCH: 25 pg — ABNORMAL LOW (ref 26.0–34.0)
MCHC: 32 g/dL (ref 30.0–36.0)
MCV: 78 fL — ABNORMAL LOW (ref 80.0–100.0)
Monocytes Absolute: 0.3 10*3/uL (ref 0.1–1.0)
Monocytes Relative: 6 %
Neutro Abs: 2.4 10*3/uL (ref 1.7–7.7)
Neutrophils Relative %: 56 %
Platelets: 165 10*3/uL (ref 150–400)
RBC: 5.13 MIL/uL — ABNORMAL HIGH (ref 3.87–5.11)
RDW: 15.9 % — ABNORMAL HIGH (ref 11.5–15.5)
WBC: 4.2 10*3/uL (ref 4.0–10.5)
nRBC: 0 % (ref 0.0–0.2)

## 2020-08-17 LAB — COMPREHENSIVE METABOLIC PANEL
ALT: 15 U/L (ref 0–44)
AST: 21 U/L (ref 15–41)
Albumin: 3 g/dL — ABNORMAL LOW (ref 3.5–5.0)
Alkaline Phosphatase: 63 U/L (ref 38–126)
Anion gap: 10 (ref 5–15)
BUN: 12 mg/dL (ref 8–23)
CO2: 22 mmol/L (ref 22–32)
Calcium: 8.5 mg/dL — ABNORMAL LOW (ref 8.9–10.3)
Chloride: 107 mmol/L (ref 98–111)
Creatinine, Ser: 0.97 mg/dL (ref 0.44–1.00)
GFR, Estimated: >60 mL/min (ref >60)
Glucose, Bld: 158 mg/dL — ABNORMAL HIGH (ref 70–99)
Potassium: 2.7 mmol/L — CL (ref 3.5–5.1)
Sodium: 139 mmol/L (ref 135–145)
Total Bilirubin: 1.1 mg/dL (ref 0.3–1.2)
Total Protein: 6.2 g/dL — ABNORMAL LOW (ref 6.5–8.1)

## 2020-08-17 LAB — PROTIME-INR
INR: 1 (ref 0.8–1.2)
Prothrombin Time: 13.6 seconds (ref 11.4–15.2)

## 2020-08-17 LAB — URINALYSIS, ROUTINE W REFLEX MICROSCOPIC
Bilirubin Urine: NEGATIVE
Glucose, UA: 150 mg/dL — AB
Hgb urine dipstick: NEGATIVE
Ketones, ur: NEGATIVE mg/dL
Nitrite: NEGATIVE
Protein, ur: NEGATIVE mg/dL
Specific Gravity, Urine: 1.01 (ref 1.005–1.030)
pH: 6 (ref 5.0–8.0)

## 2020-08-17 LAB — LACTIC ACID, PLASMA: Lactic Acid, Venous: 1.4 mmol/L (ref 0.5–1.9)

## 2020-08-17 MED ORDER — POTASSIUM CHLORIDE 10 MEQ/100ML IV SOLN
10.0000 meq | INTRAVENOUS | Status: AC
Start: 2020-08-17 — End: 2020-08-17
  Administered 2020-08-17 (×2): 10 meq via INTRAVENOUS
  Filled 2020-08-17 (×2): qty 100

## 2020-08-17 MED ORDER — LOSARTAN POTASSIUM 50 MG PO TABS
25.0000 mg | ORAL_TABLET | Freq: Once | ORAL | Status: AC
  Administered 2020-08-17: 25 mg via ORAL
  Filled 2020-08-17: qty 1

## 2020-08-17 MED ORDER — POTASSIUM CHLORIDE CRYS ER 20 MEQ PO TBCR
40.0000 meq | EXTENDED_RELEASE_TABLET | Freq: Once | ORAL | Status: AC
  Administered 2020-08-17: 40 meq via ORAL
  Filled 2020-08-17: qty 2

## 2020-08-17 MED ORDER — POTASSIUM CHLORIDE CRYS ER 20 MEQ PO TBCR: 20.0000 meq | EXTENDED_RELEASE_TABLET | Freq: Two times a day (BID) | ORAL | 0 refills | Status: DC

## 2020-08-17 MED ORDER — LACTATED RINGERS IV SOLN: INTRAVENOUS | Status: DC

## 2020-08-17 NOTE — ED Notes (Signed)
Pt sleeping, pure wick in place, NAD, calm, pending arrival of PTAR for transport back to [facility].

## 2020-08-17 NOTE — Discharge Instructions (Addendum)
Take the potassium medication for your low potassium level.  Your blood pressure was actually elevated here in the emergency room.  Continue your blood pressure medications.  Follow-up with your doctor to have your potassium level rechecked

## 2020-08-17 NOTE — ED Provider Notes (Signed)
[facility] EMERGENCY DEPARTMENT Provider Note   CSN: [id number] Arrival date & time: 08/17/20  7106     History No chief complaint on file.   Emma Maldonado is a 70 y.o. female.  HPI  Patient presents to the ED with complaints of lethargy and low blood pressure.  Patient has a history of dementia and she resides at a nursing facility.  The daughter was visiting her this morning.  Patient also is recovering from a COVID diagnosis as well as pneumonia.  Patient was diagnosed with that on the 30th.  This morning the daughter noted the patient was more lethargic than usual.  EMS was called and they noticed her blood pressure was 76/42.  She was given 500 cc of saline and her blood pressure increased to 138/72.  Patient's mental status seemed to improve.  Patient has not had any vomiting or diarrhea.  No known fevers.  Patient herself states she feels fine now.  She has no complaints.  Past Medical History:  Diagnosis Date   Dementia (HCC)    Diabetes mellitus without complication (HCC)    Hypertension    Renal disorder     Patient Active Problem List   Diagnosis Date Noted   Hydronephrosis of right kidney 05/01/2020   Rib fractures 05/01/2020   UTI (urinary tract infection) 03/25/2020   Diabetes mellitus type 2, uncomplicated (HCC) 03/25/2020   Hypokalemia 03/25/2020   Essential hypertension 03/25/2020   Dementia (HCC) 03/25/2020   Altered mental status 03/25/2020   Uncontrolled type 2 diabetes mellitus with hyperglycemia (HCC) 03/25/2020   Accelerated hypertension 03/28/2012   Viral gastroenteritis 03/28/2012   Sepsis (HCC) 03/26/2012   History of renal transplant 03/26/2012   Diabetes mellitus (HCC) 03/26/2012   Hyperlipidemia 03/26/2012    Past Surgical History:  Procedure Laterality Date   ABDOMINAL HYSTERECTOMY     CHOLECYSTECTOMY     NEPHRECTOMY TRANSPLANTED ORGAN       OB History   No obstetric history on file.     Family History   Problem Relation Age of Onset   Diabetes Mellitus II Father     Social History   Tobacco Use   Smoking status: Never   Smokeless tobacco: Never  Substance Use Topics   Alcohol use: No   Drug use: No    Home Medications Prior to Admission medications   Medication Sig Start Date End Date Taking? Authorizing Provider  potassium chloride SA (KLOR-CON) 20 MEQ tablet Take 1 tablet (20 mEq total) by mouth 2 (two) times daily for 5 days. 08/17/20 08/22/20 Yes Linwood Dibbles, MD  acetaminophen (TYLENOL) 500 MG tablet Take 500 mg by mouth every 6 (six) hours as needed for fever or headache (pain, discomfort).    [provider]  alum & mag hydroxide-simeth (MAALOX/MYLANTA) 200-200-20 MG/5ML suspension Take 30 mLs by mouth every 6 (six) hours as needed for indigestion or heartburn.    [provider]  cholecalciferol (VITAMIN D) 1000 UNITS tablet Take 2,000 Units by mouth daily.    [provider]  diphenoxylate-atropine (LOMOTIL) 2.5-0.025 MG per tablet Take 1 tablet by mouth daily.    [provider]  fludrocortisone (FLORINEF) 0.1 MG tablet Take 0.1 mg by mouth at bedtime.     [provider]  guaifenesin (ROBITUSSIN) 100 MG/5ML syrup Take 200 mg by mouth every 6 (six) hours as needed for cough.    [provider]  insulin aspart (NOVOLOG) 100 UNIT/ML injection Inject 22 Units into  the skin 3 (three) times daily with meals.    [provider]  insulin glargine (LANTUS) 100 UNIT/ML injection Inject 10-50 Units into the skin every evening. Inject 10 units in the morning and 50 units at night.    [provider]  levothyroxine (SYNTHROID) 100 MCG tablet Take 100 mcg by mouth daily. 03/04/20   [provider]  loperamide (IMODIUM) 2 MG capsule Take 2 mg by mouth as needed for diarrhea or loose stools. 02/20/20   [provider]  losartan (COZAAR) 25 MG tablet Take 25 mg by mouth daily. 02/12/20   [provider]   magnesium hydroxide (MILK OF MAGNESIA) 400 MG/5ML suspension Take 30 mLs by mouth at bedtime as needed for mild constipation.    [provider]  magnesium oxide (MAG-OX) 400 MG tablet Take 400 mg by mouth daily.    [provider]  memantine (NAMENDA) 10 MG tablet Take 10 mg by mouth every 12 (twelve) hours.    [provider]  neomycin-bacitracin-polymyxin (NEOSPORIN) ointment Apply 1 application topically daily as needed for wound care.    [provider]  predniSONE (DELTASONE) 5 MG tablet Take 5 mg by mouth every morning.    [provider]  QUEtiapine (SEROQUEL) 25 MG tablet Take 25 mg by mouth at bedtime.    [provider]  rosuvastatin (CRESTOR) 5 MG tablet Take 5 mg by mouth daily. 12/01/19   [provider]  sertraline (ZOLOFT) 50 MG tablet Take 125 mg by mouth daily. Taking 2.5 tablets = 125 mg daily 02/12/20   [provider]  tacrolimus (PROGRAF) 0.5 MG capsule Take 1 capsule (0.5 mg total) by mouth daily. 03/28/12   Dhungel, Theda Belfast, MD    Allergies    Patient has no known allergies.  Review of Systems   Review of Systems  All other systems reviewed and are negative.  Physical Exam Updated Vital Signs BP (!) 188/72   Pulse 61   Temp 97.6 F (36.4 C) (Oral)   Resp 14   Ht 1.575 m (5\' 2" )   Wt 57.9 kg   SpO2 97%   BMI 23.35 kg/m   Physical Exam Vitals and nursing note reviewed.  Constitutional:      General: She is not in acute distress.    Appearance: She is well-developed.  HENT:     Head: Normocephalic and atraumatic.     Right Ear: External ear normal.     Left Ear: External ear normal.     Mouth/Throat:     Mouth: Mucous membranes are dry.  Eyes:     General: No scleral icterus.       Right eye: No discharge.        Left eye: No discharge.     Conjunctiva/sclera: Conjunctivae normal.  Neck:     Trachea: No tracheal deviation.  Cardiovascular:     Rate and Rhythm: Normal rate and  regular rhythm.  Pulmonary:     Effort: Pulmonary effort is normal. No respiratory distress.     Breath sounds: Normal breath sounds. No stridor. No wheezing or rales.  Abdominal:     General: Bowel sounds are normal. There is no distension.     Palpations: Abdomen is soft.     Tenderness: There is no abdominal tenderness. There is no guarding or rebound.  Musculoskeletal:        General: No tenderness or deformity.     Cervical back: Neck supple.  Skin:  General: Skin is warm and dry.     Findings: No rash.  Neurological:     General: No focal deficit present.     Mental Status: She is alert.     Cranial Nerves: No cranial nerve deficit (no facial droop, extraocular movements intact, no slurred speech).     Sensory: No sensory deficit.     Motor: No abnormal muscle tone or seizure activity.     Coordination: Coordination normal.  Psychiatric:        Mood and Affect: Mood normal.    ED Results / Procedures / Treatments   Labs (all labs ordered are listed, but only abnormal results are displayed) Labs Reviewed  COMPREHENSIVE METABOLIC PANEL - Abnormal; Notable for the following components:      Result Value   Potassium 2.7 (*)    Glucose, Bld 158 (*)    Calcium 8.5 (*)    Total Protein 6.2 (*)    Albumin 3.0 (*)    All other components within normal limits  CBC WITH DIFFERENTIAL/PLATELET - Abnormal; Notable for the following components:   RBC 5.13 (*)    MCV 78.0 (*)    MCH 25.0 (*)    RDW 15.9 (*)    All other components within normal limits  URINALYSIS, ROUTINE W REFLEX MICROSCOPIC - Abnormal; Notable for the following components:   APPearance CLOUDY (*)    Glucose, UA 150 (*)    Leukocytes,Ua SMALL (*)    Bacteria, UA RARE (*)    All other components within normal limits  CULTURE, BLOOD (SINGLE)  LACTIC ACID, PLASMA  PROTIME-INR  APTT    EKG EKG Interpretation  Date/Time:  Saturday August 17 2020 08:48:48 EDT Ventricular Rate:  63 PR Interval:  145 QRS  Duration: 97 QT Interval:  474 QTC Calculation: 486 R Axis:   66 Text Interpretation: Sinus rhythm Borderline prolonged QT interval , new since last tracing Confirmed by Linwood DibblesKnapp, Reda Gettis [phone]([id number]) on 08/17/2020 11:02:34 AM  Radiology DG Chest Port 1 View  Result Date: 08/17/2020 CLINICAL DATA:  Questionable sepsis, evaluate for abnormality. EXAM: PORTABLE CHEST 1 VIEW COMPARISON:  03/24/2020 FINDINGS: Single view of the chest was obtained. Lungs are clear without focal airspace disease. Heart size is prominent but likely accentuated by the projection and AP technique. Trachea is midline. Negative for a pneumothorax. No acute bone abnormality. IMPRESSION: No active disease. Electronically Signed   By: Richarda OverlieAdam  Henn M.D.   On: 08/17/2020 09:43    Procedures Procedures   Medications Ordered in ED Medications  lactated ringers infusion ( Intravenous Stopped 08/17/20 1407)  potassium chloride 10 mEq in 100 mL IVPB (10 mEq Intravenous New Bag/Given 08/17/20 1311)  potassium chloride SA (KLOR-CON) CR tablet 40 mEq (40 mEq Oral Given 08/17/20 1131)  losartan (COZAAR) tablet 25 mg (25 mg Oral Given 08/17/20 1406)    ED Course  I have reviewed the triage vital signs and the nursing notes.  Pertinent labs & imaging results that were available during my care of the patient were reviewed by me and considered in my medical decision making (see chart for details).  Clinical Course as of 08/17/20 1409  Sat Aug 17, 2020  1100 Blood pressure remained stable. [JK]  1100 CBC without anemia or leukocytosis [JK]  1100 Show lactic acid level normal.  Laboratory test notable for hypokalemia [JK]  1102 Chest x-ray without acute findings [JK]  1259 Urinalysis not suggestive of infection. [JK]  1259 Vital signs have remained stable. [JK]  1402 BP has been increasing.  WIll give bp meds [JK]    Clinical Course User Index [JK] Linwood Dibbles, MD   MDM Rules/Calculators/A&P                           Patient presented to the ED  for evaluation of confusion and questionable low blood pressure.  In the ED the patient was normotensive.  In fact she actually was hypertensive most of her visit.  No signs of sepsis.  No anemia.  No signs of kidney failure.  Patient was noted to be hypokalemic.  She was given IV and oral potassium.  Patient tolerated this without difficulty.  Plan on discharge back to the nursing facility with potassium supplement.  Recommend outpatient follow-up Final Clinical Impression(s) / ED Diagnoses Final diagnoses:  Hypokalemia  Hypertension, unspecified type    Rx / DC Orders ED Discharge Orders          Ordered    potassium chloride SA (KLOR-CON) 20 MEQ tablet  2 times daily        08/17/20 1409             Linwood Dibbles, MD 08/17/20 1409

## 2020-08-17 NOTE — ED Triage Notes (Signed)
Patient brought to ED via EMS from [facility] Pt's daughter visiting pt this AM, reports pt has dementia at baseline but was lethargic, more confused than usual. EMS reports initial BP 76/42, increased to 138/72 after NS. Patient alert and oriented to self and place on arrival to ED, follows commands. Per EMS, pt currently being treated with antiviral for COVID (dx 07/30) and abx for pneumonia.   EMS v/s: 168 cbg 58 HR 99% 2L NS

## 2020-08-17 NOTE — ED Notes (Signed)
Ptar called by Mohannad Olivero unable to give pick up time °

## 2020-08-17 NOTE — ED Notes (Signed)
Pt's systolic pressure in the 200s. MD Lynelle Doctor made aware. BP med administered.

## 2020-08-22 ENCOUNTER — Emergency Department (HOSPITAL_COMMUNITY): Payer: Medicare Other

## 2020-08-22 ENCOUNTER — Other Ambulatory Visit: Payer: Self-pay

## 2020-08-22 ENCOUNTER — Encounter (HOSPITAL_COMMUNITY): Payer: Self-pay

## 2020-08-22 ENCOUNTER — Inpatient Hospital Stay (HOSPITAL_COMMUNITY)
Admission: EM | Admit: 2020-08-22 | Discharge: 2020-09-02 | DRG: 314 | Disposition: A | Discharge status: Nursing Home (Includes ICF) | Payer: Medicare Other | Source: Skilled Nursing Facility | Attending: Internal Medicine | Admitting: Internal Medicine

## 2020-08-22 DIAGNOSIS — E11649 Type 2 diabetes mellitus with hypoglycemia without coma: Secondary | ICD-10-CM | POA: Diagnosis present

## 2020-08-22 DIAGNOSIS — G309 Alzheimer's disease, unspecified: Secondary | ICD-10-CM | POA: Diagnosis not present

## 2020-08-22 DIAGNOSIS — Z79890 Hormone replacement therapy (postmenopausal): Secondary | ICD-10-CM

## 2020-08-22 DIAGNOSIS — I959 Hypotension, unspecified: Principal | ICD-10-CM | POA: Diagnosis present

## 2020-08-22 DIAGNOSIS — I10 Essential (primary) hypertension: Secondary | ICD-10-CM | POA: Diagnosis not present

## 2020-08-22 DIAGNOSIS — E119 Type 2 diabetes mellitus without complications: Secondary | ICD-10-CM

## 2020-08-22 DIAGNOSIS — F32A Depression, unspecified: Secondary | ICD-10-CM | POA: Diagnosis present

## 2020-08-22 DIAGNOSIS — F0390 Unspecified dementia without behavioral disturbance: Secondary | ICD-10-CM | POA: Diagnosis present

## 2020-08-22 DIAGNOSIS — E876 Hypokalemia: Secondary | ICD-10-CM | POA: Diagnosis present

## 2020-08-22 DIAGNOSIS — Z7952 Long term (current) use of systemic steroids: Secondary | ICD-10-CM

## 2020-08-22 DIAGNOSIS — Z794 Long term (current) use of insulin: Secondary | ICD-10-CM

## 2020-08-22 DIAGNOSIS — U071 COVID-19: Secondary | ICD-10-CM | POA: Diagnosis present

## 2020-08-22 DIAGNOSIS — I9589 Other hypotension: Secondary | ICD-10-CM | POA: Diagnosis not present

## 2020-08-22 DIAGNOSIS — N179 Acute kidney failure, unspecified: Principal | ICD-10-CM

## 2020-08-22 DIAGNOSIS — R8271 Bacteriuria: Secondary | ICD-10-CM | POA: Diagnosis present

## 2020-08-22 DIAGNOSIS — Z9049 Acquired absence of other specified parts of digestive tract: Secondary | ICD-10-CM

## 2020-08-22 DIAGNOSIS — F0280 Dementia in other diseases classified elsewhere without behavioral disturbance: Secondary | ICD-10-CM

## 2020-08-22 DIAGNOSIS — E782 Mixed hyperlipidemia: Secondary | ICD-10-CM | POA: Diagnosis not present

## 2020-08-22 DIAGNOSIS — T8619 Other complication of kidney transplant: Secondary | ICD-10-CM | POA: Diagnosis present

## 2020-08-22 DIAGNOSIS — E1165 Type 2 diabetes mellitus with hyperglycemia: Secondary | ICD-10-CM | POA: Diagnosis present

## 2020-08-22 DIAGNOSIS — Z79899 Other long term (current) drug therapy: Secondary | ICD-10-CM

## 2020-08-22 DIAGNOSIS — Z90710 Acquired absence of both cervix and uterus: Secondary | ICD-10-CM

## 2020-08-22 DIAGNOSIS — E861 Hypovolemia: Secondary | ICD-10-CM

## 2020-08-22 DIAGNOSIS — D696 Thrombocytopenia, unspecified: Secondary | ICD-10-CM | POA: Diagnosis present

## 2020-08-22 DIAGNOSIS — Z833 Family history of diabetes mellitus: Secondary | ICD-10-CM

## 2020-08-22 DIAGNOSIS — D84821 Immunodeficiency due to drugs: Secondary | ICD-10-CM | POA: Diagnosis present

## 2020-08-22 DIAGNOSIS — E2740 Unspecified adrenocortical insufficiency: Secondary | ICD-10-CM | POA: Diagnosis present

## 2020-08-22 DIAGNOSIS — N189 Chronic kidney disease, unspecified: Secondary | ICD-10-CM

## 2020-08-22 DIAGNOSIS — F03918 Unspecified dementia, unspecified severity, with other behavioral disturbance: Secondary | ICD-10-CM | POA: Diagnosis present

## 2020-08-22 DIAGNOSIS — Z940 Kidney transplant status: Secondary | ICD-10-CM

## 2020-08-22 DIAGNOSIS — E785 Hyperlipidemia, unspecified: Secondary | ICD-10-CM | POA: Diagnosis present

## 2020-08-22 DIAGNOSIS — E039 Hypothyroidism, unspecified: Secondary | ICD-10-CM | POA: Diagnosis present

## 2020-08-22 DIAGNOSIS — Y830 Surgical operation with transplant of whole organ as the cause of abnormal reaction of the patient, or of later complication, without mention of misadventure at the time of the procedure: Secondary | ICD-10-CM | POA: Diagnosis present

## 2020-08-22 LAB — CBC
HCT: 40.6 % (ref 36.0–46.0)
Hemoglobin: 13 g/dL (ref 12.0–15.0)
MCH: 24.7 pg — ABNORMAL LOW (ref 26.0–34.0)
MCHC: 32 g/dL (ref 30.0–36.0)
MCV: 77.2 fL — ABNORMAL LOW (ref 80.0–100.0)
Platelets: 248 10*3/uL (ref 150–400)
RBC: 5.26 MIL/uL — ABNORMAL HIGH (ref 3.87–5.11)
RDW: 16.1 % — ABNORMAL HIGH (ref 11.5–15.5)
WBC: 7.7 10*3/uL (ref 4.0–10.5)
nRBC: 0 % (ref 0.0–0.2)

## 2020-08-22 LAB — COMPREHENSIVE METABOLIC PANEL
ALT: 19 U/L (ref 0–44)
AST: 34 U/L (ref 15–41)
Albumin: 3.7 g/dL (ref 3.5–5.0)
Alkaline Phosphatase: 95 U/L (ref 38–126)
Anion gap: 13 (ref 5–15)
BUN: 14 mg/dL (ref 8–23)
CO2: 24 mmol/L (ref 22–32)
Calcium: 8.3 mg/dL — ABNORMAL LOW (ref 8.9–10.3)
Chloride: 104 mmol/L (ref 98–111)
Creatinine, Ser: 1.52 mg/dL — ABNORMAL HIGH (ref 0.44–1.00)
GFR, Estimated: 36 mL/min — ABNORMAL LOW (ref >60)
Glucose, Bld: 127 mg/dL — ABNORMAL HIGH (ref 70–99)
Potassium: 3.6 mmol/L (ref 3.5–5.1)
Sodium: 141 mmol/L (ref 135–145)
Total Bilirubin: 0.9 mg/dL (ref 0.3–1.2)
Total Protein: 7.5 g/dL (ref 6.5–8.1)

## 2020-08-22 LAB — PROTIME-INR
INR: 1 (ref 0.8–1.2)
Prothrombin Time: 12.9 seconds (ref 11.4–15.2)

## 2020-08-22 LAB — URINALYSIS, ROUTINE W REFLEX MICROSCOPIC
Bilirubin Urine: NEGATIVE
Glucose, UA: NEGATIVE mg/dL
Ketones, ur: NEGATIVE mg/dL
Leukocytes,Ua: NEGATIVE
Nitrite: NEGATIVE
Protein, ur: NEGATIVE mg/dL
Specific Gravity, Urine: 1.003 — ABNORMAL LOW (ref 1.005–1.030)
pH: 6 (ref 5.0–8.0)

## 2020-08-22 LAB — CREATININE, SERUM
Creatinine, Ser: 1.07 mg/dL — ABNORMAL HIGH (ref 0.44–1.00)
GFR, Estimated: 56 mL/min — ABNORMAL LOW (ref >60)

## 2020-08-22 LAB — CBC WITH DIFFERENTIAL/PLATELET
Abs Immature Granulocytes: 0.11 10*3/uL — ABNORMAL HIGH (ref 0.00–0.07)
Basophils Absolute: 0 10*3/uL (ref 0.0–0.1)
Basophils Relative: 0 %
Eosinophils Absolute: 0.1 10*3/uL (ref 0.0–0.5)
Eosinophils Relative: 1 %
HCT: 43.2 % (ref 36.0–46.0)
Hemoglobin: 13.7 g/dL (ref 12.0–15.0)
Immature Granulocytes: 1 %
Lymphocytes Relative: 12 %
Lymphs Abs: 1.1 10*3/uL (ref 0.7–4.0)
MCH: 24.5 pg — ABNORMAL LOW (ref 26.0–34.0)
MCHC: 31.7 g/dL (ref 30.0–36.0)
MCV: 77.1 fL — ABNORMAL LOW (ref 80.0–100.0)
Monocytes Absolute: 0.5 10*3/uL (ref 0.1–1.0)
Monocytes Relative: 5 %
Neutro Abs: 7.4 10*3/uL (ref 1.7–7.7)
Neutrophils Relative %: 81 %
Platelets: 246 10*3/uL (ref 150–400)
RBC: 5.6 MIL/uL — ABNORMAL HIGH (ref 3.87–5.11)
RDW: 16.1 % — ABNORMAL HIGH (ref 11.5–15.5)
WBC: 9.2 10*3/uL (ref 4.0–10.5)
nRBC: 0 % (ref 0.0–0.2)

## 2020-08-22 LAB — RESP PANEL BY RT-PCR (FLU A&B, COVID) ARPGX2
Influenza A by PCR: NEGATIVE
Influenza B by PCR: NEGATIVE
SARS Coronavirus 2 by RT PCR: POSITIVE — AB

## 2020-08-22 LAB — TROPONIN I (HIGH SENSITIVITY)
Troponin I (High Sensitivity): 6 ng/L (ref <18)
Troponin I (High Sensitivity): 6 ng/L (ref <18)

## 2020-08-22 LAB — GLUCOSE, CAPILLARY: Glucose-Capillary: 89 mg/dL (ref 70–99)

## 2020-08-22 LAB — PHOSPHORUS: Phosphorus: 3.5 mg/dL (ref 2.5–4.6)

## 2020-08-22 LAB — MAGNESIUM: Magnesium: 0.9 mg/dL — CL (ref 1.7–2.4)

## 2020-08-22 LAB — CULTURE, BLOOD (SINGLE): Culture: NO GROWTH

## 2020-08-22 LAB — APTT: aPTT: 29 seconds (ref 24–36)

## 2020-08-22 LAB — HEMOGLOBIN A1C
Hgb A1c MFr Bld: 7.3 % — ABNORMAL HIGH (ref 4.8–5.6)
Mean Plasma Glucose: 162.81 mg/dL

## 2020-08-22 LAB — LACTIC ACID, PLASMA: Lactic Acid, Venous: 1.7 mmol/L (ref 0.5–1.9)

## 2020-08-22 LAB — TSH: TSH: 1.404 u[IU]/mL (ref 0.350–4.500)

## 2020-08-22 MED ORDER — MAGNESIUM SULFATE 2 GM/50ML IV SOLN
2.0000 g | Freq: Once | INTRAVENOUS | Status: AC
  Administered 2020-08-22: 2 g via INTRAVENOUS
  Filled 2020-08-22: qty 50

## 2020-08-22 MED ORDER — HEPARIN SODIUM (PORCINE) 5000 UNIT/ML IJ SOLN
5000.0000 [IU] | Freq: Three times a day (TID) | INTRAMUSCULAR | Status: DC
  Administered 2020-08-22 – 2020-09-02 (×33): 5000 [IU] via SUBCUTANEOUS
  Filled 2020-08-22 (×33): qty 1

## 2020-08-22 MED ORDER — ACETAMINOPHEN 650 MG RE SUPP: 650.0000 mg | Freq: Four times a day (QID) | RECTAL | Status: DC | PRN

## 2020-08-22 MED ORDER — SODIUM CHLORIDE 0.9 % IV SOLN: INTRAVENOUS | Status: DC

## 2020-08-22 MED ORDER — ONDANSETRON HCL 4 MG/2ML IJ SOLN: 4.0000 mg | Freq: Four times a day (QID) | INTRAMUSCULAR | Status: DC | PRN

## 2020-08-22 MED ORDER — ONDANSETRON HCL 4 MG PO TABS: 4.0000 mg | ORAL_TABLET | Freq: Four times a day (QID) | ORAL | Status: DC | PRN

## 2020-08-22 MED ORDER — ACETAMINOPHEN 325 MG PO TABS: 650.0000 mg | ORAL_TABLET | Freq: Four times a day (QID) | ORAL | Status: DC | PRN

## 2020-08-22 MED ORDER — INSULIN ASPART 100 UNIT/ML IJ SOLN
0.0000 [IU] | Freq: Three times a day (TID) | INTRAMUSCULAR | Status: DC
  Administered 2020-08-23: 2 [IU] via SUBCUTANEOUS
  Administered 2020-08-23: 3 [IU] via SUBCUTANEOUS
  Administered 2020-08-24 (×2): 5 [IU] via SUBCUTANEOUS
  Filled 2020-08-22: qty 0.15

## 2020-08-22 MED ORDER — INSULIN ASPART 100 UNIT/ML IJ SOLN
0.0000 [IU] | Freq: Every day | INTRAMUSCULAR | Status: DC
  Filled 2020-08-22: qty 0.05

## 2020-08-22 NOTE — H&P (Addendum)
History and Physical    Dene Landsberg BWG:[id number] DOB: 10-27-1950 DOA: 08/22/2020  PCP: Patient, No Pcp Per (Inactive)  Patient coming from: [facility]  I have personally briefly reviewed patient's old medical records in [facility]  Chief Complaint: Low blood pressure  HPI: Anginette Espejo is a 70 y.o. female with medical history significant of hypertension, dementia, history of renal transplant on immunosuppressant followed by her nephrologist at [facility], brought by EMS from [facility] for evaluation of low blood pressure.  She is poor historian.  History gathered from ER provider.  Apparently EMS was called and noted that patient has low blood pressure of 60/40. BS: 51, IVF given and brought patient to ED for further evaluation. No loss of consciousness, seizure, fall, fever, chills.  She said she is not feeling well, has some headache and feels like she has flu.  Of note: She presented to ED on 8/6 with similar symptoms-work-up was negative except hypokalemia which was replaced and she discharged to SNF  in stable condition. AS per daughter patient had covid infection 2 weeks ago. She is fully vaccinated and boosted.  ED Course: Upon arrival to ED: Patient's blood pressure normalized to slightly elevated.  Afebrile, no leukocytosis.  Troponin, lactic acid: WNL, UA negative for infection.  CMP show AKI.  Chest x-ray negative for acute findings.  Renal ultrasound ordered and is pending.  [facility] hospitalist consulted for admission for overnight observation due to worsening renal function.  Review of Systems: As per HPI otherwise negative.    Past Medical History:  Diagnosis Date   Dementia (HCC)    Diabetes mellitus without complication (HCC)    Hypertension    Renal disorder     Past Surgical History:  Procedure Laterality Date   ABDOMINAL HYSTERECTOMY     CHOLECYSTECTOMY     NEPHRECTOMY TRANSPLANTED ORGAN       reports that she has never smoked.  She has never used smokeless tobacco. She reports that she does not drink alcohol and does not use drugs.  No Known Allergies  Family History  Problem Relation Age of Onset   Diabetes Mellitus II Father     Prior to Admission medications   Medication Sig Start Date End Date Taking? Authorizing Provider  acetaminophen (TYLENOL) 500 MG tablet Take 500 mg by mouth every 6 (six) hours as needed for fever or headache (pain, discomfort).    [provider]  alum & mag hydroxide-simeth (MAALOX/MYLANTA) 200-200-20 MG/5ML suspension Take 30 mLs by mouth every 6 (six) hours as needed for indigestion or heartburn.    [provider]  cholecalciferol (VITAMIN D) 1000 UNITS tablet Take 2,000 Units by mouth daily.    [provider]  diphenoxylate-atropine (LOMOTIL) 2.5-0.025 MG per tablet Take 1 tablet by mouth daily.    [provider]  fludrocortisone (FLORINEF) 0.1 MG tablet Take 0.1 mg by mouth at bedtime.     [provider]  guaifenesin (ROBITUSSIN) 100 MG/5ML syrup Take 200 mg by mouth every 6 (six) hours as needed for cough.    [provider]  insulin aspart (NOVOLOG) 100 UNIT/ML injection Inject 22 Units into the skin 3 (three) times daily with meals.    [provider]  insulin glargine (LANTUS) 100 UNIT/ML injection Inject 10-50 Units into the skin every evening. Inject 10 units in the morning and 50 units at night.    [provider]  levothyroxine (SYNTHROID) 100 MCG tablet Take 100 mcg by  mouth daily. 03/04/20   [provider]  loperamide (IMODIUM) 2 MG capsule Take 2 mg by mouth as needed for diarrhea or loose stools. 02/20/20   [provider]  losartan (COZAAR) 25 MG tablet Take 25 mg by mouth daily. 02/12/20   [provider]  magnesium hydroxide (MILK OF MAGNESIA) 400 MG/5ML suspension Take 30 mLs by mouth at bedtime as needed for mild constipation.    [provider]  magnesium  oxide (MAG-OX) 400 MG tablet Take 400 mg by mouth daily.    [provider]  memantine (NAMENDA) 10 MG tablet Take 10 mg by mouth every 12 (twelve) hours.    [provider]  neomycin-bacitracin-polymyxin (NEOSPORIN) ointment Apply 1 application topically daily as needed for wound care.    [provider]  potassium chloride SA (KLOR-CON) 20 MEQ tablet Take 1 tablet (20 mEq total) by mouth 2 (two) times daily for 5 days. 08/17/20 08/22/20  Linwood Dibbles, MD  predniSONE (DELTASONE) 5 MG tablet Take 5 mg by mouth every morning.    [provider]  QUEtiapine (SEROQUEL) 25 MG tablet Take 25 mg by mouth at bedtime.    [provider]  rosuvastatin (CRESTOR) 5 MG tablet Take 5 mg by mouth daily. 12/01/19   [provider]  sertraline (ZOLOFT) 50 MG tablet Take 125 mg by mouth daily. Taking 2.5 tablets = 125 mg daily 02/12/20   [provider]  tacrolimus (PROGRAF) 0.5 MG capsule Take 1 capsule (0.5 mg total) by mouth daily. 03/28/12   Eddie North, MD    Physical Exam: Vitals:   08/22/20 1156 08/22/20 1200 08/22/20 1255 08/22/20 1415  BP:  (!) 149/74 (!) 142/73 (!) 167/73  Pulse:  75 73 74  Resp:  14 16 15   Temp: 97.7 F (36.5 C)     TempSrc: Oral     SpO2:  95% 94% 96%  Weight:      Height:        Constitutional: NAD, calm, comfortable, on room air, appears confused Eyes: PERRL, lids and conjunctivae normal ENMT: Mucous membranes are moist. Posterior pharynx clear of any exudate or lesions.Normal dentition.  Neck: normal, supple, no masses, no thyromegaly Respiratory: clear to auscultation bilaterally, no wheezing, no crackles. Normal respiratory effort. No accessory muscle use.  Cardiovascular: Regular rate and rhythm, no murmurs / rubs / gallops. No extremity edema. 2+ pedal pulses. No carotid bruits.  Abdomen: no tenderness, no masses palpated. No hepatosplenomegaly. Bowel sounds positive.  Musculoskeletal: no clubbing /  cyanosis. No joint deformity upper and lower extremities. Good ROM, no contractures. Normal muscle tone.  Skin: no rashes, lesions, ulcers. No induration Neurologic: Alert but confused.  Labs on Admission: I have personally reviewed following labs and imaging studies  CBC: Recent Labs  Lab 08/17/20 0910 08/22/20 1106  WBC 4.2 9.2  NEUTROABS 2.4 7.4  HGB 12.8 13.7  HCT 40.0 43.2  MCV 78.0* 77.1*  PLT 165 246   Basic Metabolic Panel: Recent Labs  Lab 08/17/20 0910 08/22/20 1106  NA 139 141  K 2.7* 3.6  CL 107 104  CO2 22 24  GLUCOSE 158* 127*  BUN 12 14  CREATININE 0.97 1.52*  CALCIUM 8.5* 8.3*   GFR: Estimated Creatinine Clearance: 26.8 mL/min (A) (by C-G formula based on SCr of 1.52 mg/dL (H)). Liver Function Tests: Recent Labs  Lab 08/17/20 0910 08/22/20 1106  AST 21 34  ALT 15 19  ALKPHOS 63 95  BILITOT 1.1 0.9  PROT 6.2* 7.5  ALBUMIN 3.0* 3.7   No results for input(s): LIPASE, AMYLASE in the last 168 hours. No results for input(s): AMMONIA in the last 168 hours. Coagulation Profile: Recent Labs  Lab 08/17/20 0910 08/22/20 1106  INR 1.0 1.0   Cardiac Enzymes: No results for input(s): CKTOTAL, CKMB, CKMBINDEX, TROPONINI in the last 168 hours. BNP (last 3 results) No results for input(s): PROBNP in the last 8760 hours. HbA1C: No results for input(s): HGBA1C in the last 72 hours. CBG: No results for input(s): GLUCAP in the last 168 hours. Lipid Profile: No results for input(s): CHOL, HDL, LDLCALC, TRIG, CHOLHDL, LDLDIRECT in the last 72 hours. Thyroid Function Tests: No results for input(s): TSH, T4TOTAL, FREET4, T3FREE, THYROIDAB in the last 72 hours. Anemia Panel: No results for input(s): VITAMINB12, FOLATE, FERRITIN, TIBC, IRON, RETICCTPCT in the last 72 hours. Urine analysis:    Component Value Date/Time   COLORURINE YELLOW 08/22/2020 1106   APPEARANCEUR HAZY (A) 08/22/2020 1106   LABSPEC 1.003 (L) 08/22/2020 1106   PHURINE 6.0 08/22/2020  1106   GLUCOSEU NEGATIVE 08/22/2020 1106   HGBUR SMALL (A) 08/22/2020 1106   BILIRUBINUR NEGATIVE 08/22/2020 1106   KETONESUR NEGATIVE 08/22/2020 1106   PROTEINUR NEGATIVE 08/22/2020 1106   UROBILINOGEN 1.0 08/14/2012 1022   NITRITE NEGATIVE 08/22/2020 1106   LEUKOCYTESUR NEGATIVE 08/22/2020 1106    Radiological Exams on Admission: DG Chest Port 1 View  Result Date: 08/22/2020 CLINICAL DATA:  Shortness of breath. EXAM: PORTABLE CHEST 1 VIEW COMPARISON:  08/17/2020 FINDINGS: The cardiac silhouette, mediastinal and hilar contours are within normal limits given the AP projection and portable technique. The lungs are clear of an acute process. No pleural effusions. No pulmonary lesions. The bony thorax is intact. IMPRESSION: No acute cardiopulmonary findings. Electronically Signed   By: Rudie Meyer M.D.   On: 08/22/2020 11:46    EKG: Independently reviewed.  Normal sinus rhythm.  No acute ST-T wave changes noted  Assessment/Plan Principal Problem:   Hypotension Active Problems:   Diabetes mellitus (HCC)   Hyperlipidemia   Essential hypertension   Dementia (HCC)   Hypotension: -She has history of autonomic orthostatic hypotension and autonomic instability. -She is on levothyroxine and fludrocortisone at baseline. -Work-up including troponin, lactic acid, UA: Within normal limits.  Chest x-ray negative for acute findings.  No indication of antibiotics.  Sepsis ruled out.  EKG: Within normal limits. -BP is slightly on higher range -We will check orthostatic vitals.  Check electrolytes, TSH. Covid test is pending. -Monitor vitals closely. -Consult PT/OT  AKI History of renal transplant: -Cr. 1.52, GFR: 36 -Continue gentle hydration -Renal ultrasound is pending.  Avoid nephrotoxic medication -On prednisone and tacrolimus -Repeat BMP tomorrow a.m.  Hypomagnesemia: -Magnesium level 0.9.  Replenished.  Repeat magnesium level tomorrow a.m.  Hypertension: -Her blood pressure is  back to baseline.  Continue home meds losartan.  Hyperlipidemia: Continue statin  Hypothyroidism: Check TSH, continue levothyroxine  Uncontrolled type 2 diabetes mellitus: Last A1c 9.7% checked in 4/22 -Repeat A1c.  Start patient on sliding scale insulin.  Monitor blood sugar closely  Dementia: Continue Seroquel, Namenda  Depression: Continue Zoloft  Unable to resume patient's home medication as med history is incomplete.  DVT prophylaxis: Heparin/SCD Code Status: Full code Family Communication: None present at bedside.  Plan of care discussed with patient in length and she verbalized understanding and agreed with it.  I called patient's daughter and discussed plan of care and she verbalized understanding.  Disposition Plan: SNF  consults called: None Admission status: Observation   Ollen Bowlinka R Anijah Spohr MD [facility]  If 7PM-7AM, please contact night-coverage [email or web address]  08/22/2020, 3:34 PM

## 2020-08-22 NOTE — ED Triage Notes (Signed)
[facility] reports pt coming from [facility] for complaints of ALOC, upon arrival pt BP 60/40, placed in Trendelenburg and BP increased, pt c/o SOB.

## 2020-08-22 NOTE — ED Provider Notes (Signed)
[facility]-EMERGENCY DEPT Provider Note   CSN: [id number] Arrival date & time: 08/22/20  1032     History Chief Complaint  Patient presents with  . Shortness of Breath    Emma Maldonado is a 70 y.o. female.  This patient was brought in by EMS for a blood pressure of 60/40.  She also endorsed shortness of breath.  When I asked her if she knew why she was here, she was unable to tell me anything.  She had a very similar presentation 5 days ago.  At that time, she had blood work, a blood culture, and urinalysis.  All symptoms have started today.  No exacerbating or relieving symptoms.  Blood pressure was severely low but is now normalized.  She has not tried anything for symptoms.  The history is provided by the EMS personnel. The history is limited by the condition of the patient (dementia).      Past Medical History:  Diagnosis Date  . Dementia (HCC)   . Diabetes mellitus without complication (HCC)   . Hypertension   . Renal disorder     Patient Active Problem List   Diagnosis Date Noted  . Hydronephrosis of right kidney 05/01/2020  . Rib fractures 05/01/2020  . UTI (urinary tract infection) 03/25/2020  . Diabetes mellitus type 2, uncomplicated (HCC) 03/25/2020  . Hypokalemia 03/25/2020  . Essential hypertension 03/25/2020  . Dementia (HCC) 03/25/2020  . Altered mental status 03/25/2020  . Uncontrolled type 2 diabetes mellitus with hyperglycemia (HCC) 03/25/2020  . Accelerated hypertension 03/28/2012  . Viral gastroenteritis 03/28/2012  . Sepsis (HCC) 03/26/2012  . History of renal transplant 03/26/2012  . Diabetes mellitus (HCC) 03/26/2012  . Hyperlipidemia 03/26/2012    Past Surgical History:  Procedure Laterality Date  . ABDOMINAL HYSTERECTOMY    . CHOLECYSTECTOMY    . NEPHRECTOMY TRANSPLANTED ORGAN       OB History   No obstetric history on file.     Family History  Problem Relation Age of Onset  . Diabetes Mellitus II  Father     Social History   Tobacco Use  . Smoking status: Never  . Smokeless tobacco: Never  Substance Use Topics  . Alcohol use: No  . Drug use: No    Home Medications Prior to Admission medications   Medication Sig Start Date End Date Taking? Authorizing Provider  acetaminophen (TYLENOL) 500 MG tablet Take 500 mg by mouth every 6 (six) hours as needed for fever or headache (pain, discomfort).    [provider]  alum & mag hydroxide-simeth (MAALOX/MYLANTA) 200-200-20 MG/5ML suspension Take 30 mLs by mouth every 6 (six) hours as needed for indigestion or heartburn.    [provider]  cholecalciferol (VITAMIN D) 1000 UNITS tablet Take 2,000 Units by mouth daily.    [provider]  diphenoxylate-atropine (LOMOTIL) 2.5-0.025 MG per tablet Take 1 tablet by mouth daily.    [provider]  fludrocortisone (FLORINEF) 0.1 MG tablet Take 0.1 mg by mouth at bedtime.     [provider]  guaifenesin (ROBITUSSIN) 100 MG/5ML syrup Take 200 mg by mouth every 6 (six) hours as needed for cough.    [provider]  insulin aspart (NOVOLOG) 100 UNIT/ML injection Inject 22 Units into the skin 3 (three) times daily with meals.    [provider]  insulin glargine (LANTUS) 100 UNIT/ML injection Inject 10-50 Units into the skin every evening. Inject 10 units in the morning and 50 units at  night.    [provider]  levothyroxine (SYNTHROID) 100 MCG tablet Take 100 mcg by mouth daily. 03/04/20   [provider]  loperamide (IMODIUM) 2 MG capsule Take 2 mg by mouth as needed for diarrhea or loose stools. 02/20/20   [provider]  losartan (COZAAR) 25 MG tablet Take 25 mg by mouth daily. 02/12/20   [provider]  magnesium hydroxide (MILK OF MAGNESIA) 400 MG/5ML suspension Take 30 mLs by mouth at bedtime as needed for mild constipation.    [provider]  magnesium oxide (MAG-OX) 400 MG tablet Take  400 mg by mouth daily.    [provider]  memantine (NAMENDA) 10 MG tablet Take 10 mg by mouth every 12 (twelve) hours.    [provider]  neomycin-bacitracin-polymyxin (NEOSPORIN) ointment Apply 1 application topically daily as needed for wound care.    [provider]  potassium chloride SA (KLOR-CON) 20 MEQ tablet Take 1 tablet (20 mEq total) by mouth 2 (two) times daily for 5 days. 08/17/20 08/22/20  Linwood Dibbles, MD  predniSONE (DELTASONE) 5 MG tablet Take 5 mg by mouth every morning.    [provider]  QUEtiapine (SEROQUEL) 25 MG tablet Take 25 mg by mouth at bedtime.    [provider]  rosuvastatin (CRESTOR) 5 MG tablet Take 5 mg by mouth daily. 12/01/19   [provider]  sertraline (ZOLOFT) 50 MG tablet Take 125 mg by mouth daily. Taking 2.5 tablets = 125 mg daily 02/12/20   [provider]  tacrolimus (PROGRAF) 0.5 MG capsule Take 1 capsule (0.5 mg total) by mouth daily. 03/28/12   Dhungel, Theda Belfast, MD    Allergies    Patient has no known allergies.  Review of Systems   Review of Systems  Unable to perform ROS: Dementia   Physical Exam Updated Vital Signs BP 120/66   Pulse 81   Resp 19   Ht 5\' 2"  (1.575 m)   Wt 57.6 kg   SpO2 96%   BMI 23.23 kg/m   Physical Exam Vitals and nursing note reviewed.  Constitutional:      Comments: Listless but easily aroused  HENT:     Head: Normocephalic and atraumatic.     Mouth/Throat:     Mouth: Mucous membranes are moist.  Cardiovascular:     Rate and Rhythm: Normal rate and regular rhythm.     Heart sounds: Normal heart sounds.  Pulmonary:     Effort: Pulmonary effort is normal. No tachypnea.     Breath sounds: Normal breath sounds.  Abdominal:     Palpations: Abdomen is soft.     Tenderness: There is no abdominal tenderness.  Musculoskeletal:     Right lower leg: No edema.     Left lower leg: No edema.  Skin:    General: Skin is warm and dry.  Neurological:      General: No focal deficit present.     Mental Status: She is disoriented.  Psychiatric:        Mood and Affect: Mood normal.        Behavior: Behavior normal.    ED Results / Procedures / Treatments   Labs (all labs ordered are listed, but only abnormal results are displayed) Labs Reviewed  COMPREHENSIVE METABOLIC PANEL - Abnormal; Notable for the following components:      Result Value   Glucose, Bld 127 (*)    Creatinine, Ser 1.52 (*)    Calcium 8.3 (*)  GFR, Estimated 36 (*)    All other components within normal limits  CBC WITH DIFFERENTIAL/PLATELET - Abnormal; Notable for the following components:   RBC 5.60 (*)    MCV 77.1 (*)    MCH 24.5 (*)    RDW 16.1 (*)    Abs Immature Granulocytes 0.11 (*)    All other components within normal limits  URINALYSIS, ROUTINE W REFLEX MICROSCOPIC - Abnormal; Notable for the following components:   APPearance HAZY (*)    Specific Gravity, Urine 1.003 (*)    Hgb urine dipstick SMALL (*)    Bacteria, UA MANY (*)    All other components within normal limits  CULTURE, BLOOD (ROUTINE X 2)  CULTURE, BLOOD (ROUTINE X 2)  URINE CULTURE  LACTIC ACID, PLASMA  PROTIME-INR  APTT  CBC  CREATININE, SERUM  MAGNESIUM  PHOSPHORUS  TSH  TROPONIN I (HIGH SENSITIVITY)  TROPONIN I (HIGH SENSITIVITY)    EKG EKG Interpretation  Date/Time:  Thursday August 22 2020 12:55:50 EDT Ventricular Rate:  74 PR Interval:  128 QRS Duration: 85 QT Interval:  419 QTC Calculation: 465 R Axis:   63 Text Interpretation: Sinus rhythm normal axis No acute ischemia Confirmed by Pieter Partridge ([id number]) on 08/22/2020 12:59:29 PM  Radiology DG Chest Port 1 View  Result Date: 08/22/2020 CLINICAL DATA:  Shortness of breath. EXAM: PORTABLE CHEST 1 VIEW COMPARISON:  08/17/2020 FINDINGS: The cardiac silhouette, mediastinal and hilar contours are within normal limits given the AP projection and portable technique. The lungs are clear of an acute process. No pleural  effusions. No pulmonary lesions. The bony thorax is intact. IMPRESSION: No acute cardiopulmonary findings. Electronically Signed   By: Rudie Meyer M.D.   On: 08/22/2020 11:46    Procedures Procedures   Medications Ordered in ED Medications  0.9 %  sodium chloride infusion (has no administration in time range)  heparin injection 5,000 Units (has no administration in time range)  acetaminophen (TYLENOL) tablet 650 mg (has no administration in time range)    Or  acetaminophen (TYLENOL) suppository 650 mg (has no administration in time range)  ondansetron (ZOFRAN) tablet 4 mg (has no administration in time range)    Or  ondansetron (ZOFRAN) injection 4 mg (has no administration in time range)    ED Course  I have reviewed the triage vital signs and the nursing notes.  Pertinent labs & imaging results that were available during my care of the patient were reviewed by me and considered in my medical decision making (see chart for details).  Clinical Course as of 08/22/20 1537  Thu Aug 22, 2020  1535 I spoke with Dr. Jacqulyn Bath. [AW]    Clinical Course User Index [AW] Koleen Distance, MD   MDM Rules/Calculators/A&P                           [name] has a history of diabetes, hypertension, renal transplant.  She presented with a chief complaint of hypotension and shortness of breath.  She denies shortness of breath and he has not been hypoxic during her ED stay.  Her blood pressure here has been normal to slightly elevated.  She was evaluated for possible sepsis or other acute conditions such as pneumonia, UTI, metabolic abnormality.  ED work-up was largely reassuring with the exception of a drop in her GFR.  She will be admitted for observation and IV hydration. Final Clinical Impression(s) / ED Diagnoses Final diagnoses:  Acute renal failure, unspecified acute renal failure type [facility](HCC)  History of renal transplant    Rx / DC Orders ED Discharge Orders     None         Koleen DistanceWright, Dagny Fiorentino G, MD 08/22/20 1537

## 2020-08-23 DIAGNOSIS — Z7952 Long term (current) use of systemic steroids: Secondary | ICD-10-CM | POA: Diagnosis not present

## 2020-08-23 DIAGNOSIS — G309 Alzheimer's disease, unspecified: Secondary | ICD-10-CM | POA: Diagnosis not present

## 2020-08-23 DIAGNOSIS — E11649 Type 2 diabetes mellitus with hypoglycemia without coma: Secondary | ICD-10-CM | POA: Diagnosis present

## 2020-08-23 DIAGNOSIS — T8619 Other complication of kidney transplant: Secondary | ICD-10-CM | POA: Diagnosis present

## 2020-08-23 DIAGNOSIS — I959 Hypotension, unspecified: Secondary | ICD-10-CM | POA: Diagnosis present

## 2020-08-23 DIAGNOSIS — F32A Depression, unspecified: Secondary | ICD-10-CM | POA: Diagnosis present

## 2020-08-23 DIAGNOSIS — E876 Hypokalemia: Secondary | ICD-10-CM | POA: Diagnosis present

## 2020-08-23 DIAGNOSIS — U071 COVID-19: Secondary | ICD-10-CM | POA: Diagnosis present

## 2020-08-23 DIAGNOSIS — Z79899 Other long term (current) drug therapy: Secondary | ICD-10-CM | POA: Diagnosis not present

## 2020-08-23 DIAGNOSIS — D696 Thrombocytopenia, unspecified: Secondary | ICD-10-CM | POA: Diagnosis present

## 2020-08-23 DIAGNOSIS — E785 Hyperlipidemia, unspecified: Secondary | ICD-10-CM | POA: Diagnosis present

## 2020-08-23 DIAGNOSIS — N179 Acute kidney failure, unspecified: Secondary | ICD-10-CM

## 2020-08-23 DIAGNOSIS — Z90710 Acquired absence of both cervix and uterus: Secondary | ICD-10-CM | POA: Diagnosis not present

## 2020-08-23 DIAGNOSIS — D84821 Immunodeficiency due to drugs: Secondary | ICD-10-CM | POA: Diagnosis present

## 2020-08-23 DIAGNOSIS — Z794 Long term (current) use of insulin: Secondary | ICD-10-CM | POA: Diagnosis not present

## 2020-08-23 DIAGNOSIS — F0390 Unspecified dementia without behavioral disturbance: Secondary | ICD-10-CM | POA: Diagnosis present

## 2020-08-23 DIAGNOSIS — E039 Hypothyroidism, unspecified: Secondary | ICD-10-CM | POA: Diagnosis present

## 2020-08-23 DIAGNOSIS — E1165 Type 2 diabetes mellitus with hyperglycemia: Secondary | ICD-10-CM | POA: Diagnosis present

## 2020-08-23 DIAGNOSIS — N189 Chronic kidney disease, unspecified: Secondary | ICD-10-CM

## 2020-08-23 DIAGNOSIS — Z9049 Acquired absence of other specified parts of digestive tract: Secondary | ICD-10-CM | POA: Diagnosis not present

## 2020-08-23 DIAGNOSIS — I9589 Other hypotension: Secondary | ICD-10-CM | POA: Diagnosis not present

## 2020-08-23 DIAGNOSIS — I10 Essential (primary) hypertension: Secondary | ICD-10-CM | POA: Diagnosis present

## 2020-08-23 DIAGNOSIS — Z833 Family history of diabetes mellitus: Secondary | ICD-10-CM | POA: Diagnosis not present

## 2020-08-23 DIAGNOSIS — R8271 Bacteriuria: Secondary | ICD-10-CM | POA: Diagnosis present

## 2020-08-23 DIAGNOSIS — Z940 Kidney transplant status: Secondary | ICD-10-CM | POA: Diagnosis not present

## 2020-08-23 DIAGNOSIS — Z79890 Hormone replacement therapy (postmenopausal): Secondary | ICD-10-CM | POA: Diagnosis not present

## 2020-08-23 DIAGNOSIS — E2740 Unspecified adrenocortical insufficiency: Secondary | ICD-10-CM | POA: Diagnosis present

## 2020-08-23 DIAGNOSIS — Y830 Surgical operation with transplant of whole organ as the cause of abnormal reaction of the patient, or of later complication, without mention of misadventure at the time of the procedure: Secondary | ICD-10-CM | POA: Diagnosis present

## 2020-08-23 LAB — BASIC METABOLIC PANEL
Anion gap: 10 (ref 5–15)
BUN: 16 mg/dL (ref 8–23)
CO2: 22 mmol/L (ref 22–32)
Calcium: 7.9 mg/dL — ABNORMAL LOW (ref 8.9–10.3)
Chloride: 111 mmol/L (ref 98–111)
Creatinine, Ser: 0.87 mg/dL (ref 0.44–1.00)
GFR, Estimated: >60 mL/min (ref >60)
Glucose, Bld: 108 mg/dL — ABNORMAL HIGH (ref 70–99)
Potassium: 3 mmol/L — ABNORMAL LOW (ref 3.5–5.1)
Sodium: 143 mmol/L (ref 135–145)

## 2020-08-23 LAB — MAGNESIUM: Magnesium: 1.4 mg/dL — ABNORMAL LOW (ref 1.7–2.4)

## 2020-08-23 LAB — CBC
HCT: 39.5 % (ref 36.0–46.0)
Hemoglobin: 12.7 g/dL (ref 12.0–15.0)
MCH: 25 pg — ABNORMAL LOW (ref 26.0–34.0)
MCHC: 32.2 g/dL (ref 30.0–36.0)
MCV: 77.8 fL — ABNORMAL LOW (ref 80.0–100.0)
Platelets: 236 10*3/uL (ref 150–400)
RBC: 5.08 MIL/uL (ref 3.87–5.11)
RDW: 16.2 % — ABNORMAL HIGH (ref 11.5–15.5)
WBC: 6.9 10*3/uL (ref 4.0–10.5)
nRBC: 0 % (ref 0.0–0.2)

## 2020-08-23 LAB — GLUCOSE, CAPILLARY
Glucose-Capillary: 199 mg/dL — ABNORMAL HIGH (ref 70–99)
Glucose-Capillary: 75 mg/dL (ref 70–99)
Glucose-Capillary: 149 mg/dL — ABNORMAL HIGH (ref 70–99)
Glucose-Capillary: 184 mg/dL — ABNORMAL HIGH (ref 70–99)

## 2020-08-23 MED ORDER — GUAIFENESIN 100 MG/5ML PO SOLN: 200.0000 mg | Freq: Four times a day (QID) | ORAL | Status: DC | PRN

## 2020-08-23 MED ORDER — FLUDROCORTISONE ACETATE 0.1 MG PO TABS
0.1000 mg | ORAL_TABLET | Freq: Every day | ORAL | Status: DC
  Administered 2020-08-23 – 2020-08-24 (×2): 0.1 mg via ORAL
  Filled 2020-08-23 (×2): qty 1

## 2020-08-23 MED ORDER — VITAMIN D 25 MCG (1000 UNIT) PO TABS
2000.0000 [IU] | ORAL_TABLET | Freq: Every day | ORAL | Status: DC
  Administered 2020-08-24 – 2020-09-02 (×10): 2000 [IU] via ORAL
  Filled 2020-08-23 (×10): qty 2

## 2020-08-23 MED ORDER — QUETIAPINE FUMARATE 25 MG PO TABS
25.0000 mg | ORAL_TABLET | Freq: Every day | ORAL | Status: DC
  Administered 2020-08-23 – 2020-09-01 (×10): 25 mg via ORAL
  Filled 2020-08-23 (×10): qty 1

## 2020-08-23 MED ORDER — TACROLIMUS 0.5 MG PO CAPS
0.5000 mg | ORAL_CAPSULE | Freq: Every day | ORAL | Status: DC
  Administered 2020-08-23 – 2020-09-02 (×11): 0.5 mg via ORAL
  Filled 2020-08-23 (×11): qty 1

## 2020-08-23 MED ORDER — MEMANTINE HCL 10 MG PO TABS
10.0000 mg | ORAL_TABLET | Freq: Two times a day (BID) | ORAL | Status: DC
  Administered 2020-08-23 – 2020-09-02 (×20): 10 mg via ORAL
  Filled 2020-08-23 (×20): qty 1

## 2020-08-23 MED ORDER — PREDNISONE 5 MG PO TABS
5.0000 mg | ORAL_TABLET | Freq: Every morning | ORAL | Status: DC
  Administered 2020-08-24 – 2020-09-02 (×10): 5 mg via ORAL
  Filled 2020-08-23 (×10): qty 1

## 2020-08-23 MED ORDER — LEVOTHYROXINE SODIUM 100 MCG PO TABS
100.0000 ug | ORAL_TABLET | Freq: Every day | ORAL | Status: DC
  Administered 2020-08-24 – 2020-09-02 (×10): 100 ug via ORAL
  Filled 2020-08-23 (×10): qty 1

## 2020-08-23 MED ORDER — ROSUVASTATIN CALCIUM 5 MG PO TABS
5.0000 mg | ORAL_TABLET | Freq: Every day | ORAL | Status: DC
  Administered 2020-08-23 – 2020-09-01 (×10): 5 mg via ORAL
  Filled 2020-08-23 (×9): qty 1

## 2020-08-23 MED ORDER — ALUM & MAG HYDROXIDE-SIMETH 200-200-20 MG/5ML PO SUSP: 30.0000 mL | Freq: Four times a day (QID) | ORAL | Status: DC | PRN

## 2020-08-23 NOTE — Evaluation (Signed)
Physical Therapy Evaluation Patient Details Name: Emma Maldonado MRN: [id number] DOB: October 19, 1950 Today's Date: 08/23/2020   History of Present Illness  70 y.o. female with medical history significant of hypertension, dementia, history of renal transplant on immunosuppressant followed by her nephrologist at [facility], brought by EMS from [facility] house for evaluation of low blood pressure.  Clinical Impression  Pt admitted with above diagnosis. Pt currently with functional limitations due to the deficits listed below (see PT Problem List). Pt will benefit from skilled PT to increase their independence and safety with mobility to allow discharge to the venue listed below.  Pt agreeable to mobilize however had loose BM in depends upon sitting EOB.  Pt assisted to [facility] and with hygiene and then transferred to recliner.  Pt light min assist for steadying with standing and pivoting, also denies dizziness.  Pt can likely d/c back to facility and no f/u PT needs anticipated.     Follow Up Recommendations No PT follow up;Supervision for mobility/OOB    Equipment Recommendations  None recommended by PT    Recommendations for Other Services       Precautions / Restrictions Precautions Precautions: Fall      Mobility  Bed Mobility Overal bed mobility: Needs Assistance Bed Mobility: Supine to Sit     Supine to sit: Min guard;HOB elevated          Transfers Overall transfer level: Needs assistance Equipment used: None Transfers: Sit to/from [employer] Sit to Stand: Min assist Stand pivot transfers: Min assist       General transfer comment: pt utilized bed rail and armrests on BSC and then recliner; pt wearing diaper on arrival which was soiled so assisted pt to [facility] and changed gown, pt required assist for pericare, pt then transferred to recliner  Ambulation/Gait             General Gait Details: deferred due to [facility]  Stairs             Wheelchair Mobility    Modified Rankin (Stroke Patients Only)       Balance Overall balance assessment: History of Falls                                           Pertinent Vitals/Pain Pain Assessment: No/denies pain    Home Living Family/patient expects to be discharged to:: Assisted living               Home Equipment: Walker - 2 wheels;Cane - single point Additional Comments: Patient unreliable historian. Suspect needs assistance for mobility and ADLs - for safety.    Prior Function Level of Independence: Needs assistance   Gait / Transfers Assistance Needed: poor historian however appears ambulatory  ADL's / Homemaking Assistance Needed: likely requires at least supervision if not assist - from memory care unit        Hand Dominance        Extremity/Trunk Assessment        Lower Extremity Assessment Lower Extremity Assessment: Generalized weakness    Cervical / Trunk Assessment Cervical / Trunk Assessment: Normal  Communication   Communication: No difficulties  Cognition Arousal/Alertness: Awake/alert Behavior During Therapy: WFL for tasks assessed/performed Overall Cognitive Status: History of cognitive impairments - at baseline  General Comments: hx dementia, able to communicate with simple brief commands/sentences      General Comments      Exercises     Assessment/Plan    PT Assessment Patient needs continued PT services  PT Problem List Decreased strength;Decreased activity tolerance;Decreased balance;Decreased knowledge of use of DME;Decreased mobility;Decreased safety awareness       PT Treatment Interventions DME instruction;Gait training;Balance training;Therapeutic exercise;Functional mobility training;Therapeutic activities;Patient/family education    PT Goals (Current goals can be found in the Care Plan section)  Acute Rehab PT Goals PT Goal Formulation:  With patient Time For Goal Achievement: 09/06/20 Potential to Achieve Goals: Good    Frequency Min 2X/week   Barriers to discharge        Co-evaluation               AM-PAC PT "6 Clicks" Mobility  Outcome Measure Help needed turning from your back to your side while in a flat bed without using bedrails?: A Little Help needed moving from lying on your back to sitting on the side of a flat bed without using bedrails?: A Little Help needed moving to and from a bed to a chair (including a wheelchair)?: A Little Help needed standing up from a chair using your arms (e.g., wheelchair or bedside chair)?: A Little Help needed to walk in hospital room?: A Little Help needed climbing 3-5 steps with a railing? : A Lot 6 Click Score: 17    End of Session Equipment Utilized During Treatment: Gait belt Activity Tolerance: Patient tolerated treatment well Patient left: in chair;with call bell/phone within reach;with chair alarm set Nurse Communication: Mobility status PT Visit Diagnosis: Difficulty in walking, not elsewhere classified (R26.2)    Time: 9604-5409 PT Time Calculation (min) (ACUTE ONLY): 28 min   Charges:   PT Evaluation $PT Eval Low Complexity: 1 Low PT Treatments $Therapeutic Activity: 8-22 mins      Thomasene Mohair PT, DPT Acute Rehabilitation Services Pager: [phone] Office: [phone]   Maida Sale E 08/23/2020, 2:42 PM

## 2020-08-23 NOTE — Progress Notes (Signed)
Emma Maldonado  WUJ:[id number] DOB: Sep 28, 1950 DOA: 08/22/2020 PCP: Patient, No Pcp Per (Inactive)    Brief Narrative:  70 year old SNF resident with a history of HTN, dementia, and renal transplant on chronic immunosuppressants who was transported to the ER from her facility after she was found to have "low blood pressures."  EMS noted pressures of 60/40 on arrival as well as a blood sugar of 51.  There was no reported loss of consciousness fevers chills or falls.  Significant Events:  8/11 admit via ER  Date of Positive COVID Test:  08/22/20  COVID-19 specific Treatment: None indicated   Consultants:  None  Code Status: FULL CODE  Antimicrobials:  None  DVT prophylaxis: Subcu heparin  Subjective: Afebrile with stable vital signs since admission.  Blood pressure stable at 104-153.  Oxygen saturations 90% or greater on room air.  Resting comfortably in bedside chair.  Reports no complaints.  Assessment & Plan:  Hypotension Patient has a history of autonomic instability with orthostatic hypotension -she is on fludrocortisone at baseline -no evidence of acute MI and -no evidence of acute infection - monitor trend w/o change for now   Acute kidney injury and transplanted kidney Creatinine 1.52 with GFR 36 at presentation -renal ultrasound of transplanted kidney unremarkable - continue usual prednisone and tacrolimus -renal function has quickly corrected and returned to normal - recheck in AM to assure stability   Severe hypomagnesemia Magnesium 0.9 at presentation -remains low at 1.4 despite supplementation -continue to supplement further - f/u in AM   COVID-positive Appears to primarily be an incidental finding with no specific symptoms attributable - perhaps this did lead to decreased intake and thereby [facility]  Hypokalemia Due to hypomagnesemia and likely related to poor intake -continue supplementation  Prior history of HTN  HLD Continue usual  statin  Hypothyroidism Continue usual Synthroid dose  Uncontrolled DM2 A1c 7.3  Dementia Continue usual Seroquel and Namenda  Depression Continue usual Zoloft dose    Family Communication: No family present at time of exam Status is: Observation  The patient will require care spanning > 2 midnights and should be moved to inpatient because: Inpatient level of care appropriate due to severity of illness  Dispo: The patient is from: SNF              Anticipated d/c is to: SNF              Patient currently is not medically stable to d/c.   Difficult to place patient No     Objective: Blood pressure 104/63, pulse 81, temperature 98.5 F (36.9 C), temperature source Oral, resp. rate 18, height 5\' 2"  (1.575 m), weight 67.4 kg, SpO2 91 %.  Intake/Output Summary (Last 24 hours) at 08/23/2020 1055 Last data filed at 08/23/2020 0830 Gross per 24 hour  Intake 942.83 ml  Output --  Net 942.83 ml   Filed Weights   08/22/20 1041 08/23/20 0419 08/23/20 0532  Weight: 57.6 kg 70.1 kg 67.4 kg    Examination: General: No acute respiratory distress Lungs: Clear to auscultation bilaterally without wheezes or crackles Cardiovascular: Regular rate and rhythm without murmur gallop or rub normal S1 and S2 Abdomen: Nontender, nondistended, soft, bowel sounds positive, no rebound, no ascites, no appreciable mass Extremities: No significant cyanosis, clubbing, or edema bilateral lower extremities  CBC: Recent Labs  Lab 08/17/20 0910 08/22/20 1106 08/22/20 1533 08/23/20 0554  WBC 4.2 9.2 7.7 6.9  NEUTROABS 2.4 7.4  --   --  HGB 12.8 13.7 13.0 12.7  HCT 40.0 43.2 40.6 39.5  MCV 78.0* 77.1* 77.2* 77.8*  PLT 165 246 248 236   Basic Metabolic Panel: Recent Labs  Lab 08/17/20 0910 08/22/20 1106 08/22/20 1533 08/23/20 0554  NA 139 141  --  143  K 2.7* 3.6  --  3.0*  CL 107 104  --  111  CO2 22 24  --  22  GLUCOSE 158* 127*  --  108*  BUN 12 14  --  16  CREATININE 0.97 1.52*  1.07* 0.87  CALCIUM 8.5* 8.3*  --  7.9*  MG  --   --  0.9* 1.4*  PHOS  --   --  3.5  --    GFR: Estimated Creatinine Clearance: 53.4 mL/min (by C-G formula based on SCr of 0.87 mg/dL).  Liver Function Tests: Recent Labs  Lab 08/17/20 0910 08/22/20 1106  AST 21 34  ALT 15 19  ALKPHOS 63 95  BILITOT 1.1 0.9  PROT 6.2* 7.5  ALBUMIN 3.0* 3.7    Coagulation Profile: Recent Labs  Lab 08/17/20 0910 08/22/20 1106  INR 1.0 1.0     HbA1C: Hgb A1c MFr Bld  Date/Time Value Ref Range Status  08/22/2020 03:33 PM 7.3 (H) 4.8 - 5.6 % Final    Comment:    (NOTE) Pre diabetes:          5.7%-6.4%  Diabetes:              >6.4%  Glycemic control for   <7.0% adults with diabetes   05/03/2020 03:48 AM 9.7 (H) 4.8 - 5.6 % Final    Comment:    (NOTE) Pre diabetes:          5.7%-6.4%  Diabetes:              >6.4%  Glycemic control for   <7.0% adults with diabetes     CBG: Recent Labs  Lab 08/22/20 1758 08/22/20 2130 08/23/20 0805  GLUCAP 89 199* 75    Recent Results (from the past 240 hour(s))  Blood culture (routine single)     Status: None   Collection Time: 08/17/20  9:10 AM   Specimen: BLOOD RIGHT HAND  Result Value Ref Range Status   Specimen Description BLOOD RIGHT HAND  Final   Special Requests   Final    BOTTLES DRAWN AEROBIC AND ANAEROBIC Blood Culture results may not be optimal due to an inadequate volume of blood received in culture bottles   Culture   Final    NO GROWTH 5 DAYS Performed at [facility], [address], [place], [place] [address]    Report Status 08/22/2020 FINAL  Final  Urine Culture     Status: None (Preliminary result)   Collection Time: 08/22/20 11:06 AM   Specimen: In/Out Cath Urine  Result Value Ref Range Status   Specimen Description   Final    IN/OUT CATH URINE Performed at [facility], [address] [place], [place] [address]    Special Requests   Final    NONE Performed at [facility], [address] [place], [place] [address]    Culture   Final    CULTURE REINCUBATED FOR BETTER GROWTH Performed at [facility], [address] [place], [place] [address]    Report Status PENDING  Incomplete  Resp Panel by RT-PCR (Flu A&B, Covid) Nasopharyngeal Swab     Status: Abnormal   Collection  Time: 08/22/20  4:29 PM   Specimen: Nasopharyngeal Swab; Nasopharyngeal(NP) swabs in vial transport medium  Result Value Ref Range Status   SARS Coronavirus 2 by RT PCR POSITIVE (A) NEGATIVE Final    Comment: RESULT CALLED TO, READ BACK BY AND VERIFIED WITH: L.DAVIDSON, RN AT 1943 ON 08.11.22 BY N.THOMPSON (NOTE) SARS-CoV-2 target nucleic acids are DETECTED.  The SARS-CoV-2 RNA is generally detectable in upper respiratory specimens during the acute phase of infection. Positive results are indicative of the presence of the identified virus, but do not rule out bacterial infection or co-infection with other pathogens not detected by the test. Clinical correlation with patient history and other diagnostic information is necessary to determine patient infection status. The expected result is Negative.  Fact Sheet for Patients: BloggerCourse.com  Fact Sheet for Healthcare Providers: SeriousBroker.it  This test is not yet approved or cleared by the [place] FDA and  has been authorized for detection and/or diagnosis of SARS-CoV-2 by FDA under an Emergency Use Authorization (EUA).  This EUA will remain in effect (meaning t his test can be used) for the duration of  the COVID-19 declaration under Section 564(b)(1) of the Act, 21 U.S.C. section 360bbb-3(b)(1), unless the authorization is terminated or revoked sooner.     Influenza A by PCR NEGATIVE NEGATIVE Final   Influenza B by PCR NEGATIVE NEGATIVE Final    Comment: (NOTE) The Xpert Xpress SARS-CoV-2/FLU/RSV plus assay is intended as an aid in the  diagnosis of influenza from Nasopharyngeal swab specimens and should not be used as a sole basis for treatment. Nasal washings and aspirates are unacceptable for Xpert Xpress SARS-CoV-2/FLU/RSV testing.  Fact Sheet for Patients: BloggerCourse.com  Fact Sheet for Healthcare Providers: SeriousBroker.it  This test is not yet approved or cleared by the [place] FDA and has been authorized for detection and/or diagnosis of SARS-CoV-2 by FDA under an Emergency Use Authorization (EUA). This EUA will remain in effect (meaning this test can be used) for the duration of the COVID-19 declaration under Section 564(b)(1) of the Act, 21 U.S.C. section 360bbb-3(b)(1), unless the authorization is terminated or revoked.  Performed at [facility], [address] [place], [place] [address]      Scheduled Meds:  heparin  5,000 Units Subcutaneous Q8H   insulin aspart  0-15 Units Subcutaneous TID WC   insulin aspart  0-5 Units Subcutaneous QHS   Continuous Infusions:  sodium chloride 100 mL/hr at 08/23/20 0527     LOS: 0 days   Lonia Blood, MD [facility] Office  [phone] Pager - Text Page per Loretha Stapler  If 7PM-7AM, please contact night-coverage per Amion 08/23/2020, 10:55 AM

## 2020-08-23 NOTE — Evaluation (Signed)
Occupational Therapy Evaluation Patient Details Name: Emma Maldonado MRN: [id number] DOB: 09/01/1950 Today's Date: 08/23/2020    History of Present Illness 70 y.o. female with medical history significant of hypertension, dementia, history of renal transplant on immunosuppressant followed by her nephrologist at [facility], brought by EMS from [facility] house for evaluation of low blood pressure.   Clinical Impression   Patient is a 70 year old female who was admitted for above. Patient was noted to have decreased safety awareness, decreased functional activity tolerance and decreased strength impacting participation in ADLs. Patient would continue to benefit from skilled OT services at this time while admitted  to address noted deficits in order to improve overall safety and independence in ADLs.      Follow Up Recommendations  No OT follow up    Equipment Recommendations  None recommended by OT    Recommendations for Other Services       Precautions / Restrictions Precautions Precautions: Fall Restrictions Weight Bearing Restrictions: No      Mobility Bed Mobility Overal bed mobility: Needs Assistance Bed Mobility: Sit to Supine     Supine to sit: Min guard;HOB elevated          Transfers Overall transfer level: Needs assistance Equipment used: Rolling walker (2 wheeled) Transfers: Sit to/from [employer] Sit to Stand: Min assist Stand pivot transfers: Min assist       General transfer comment: pt utilized bed rail and armrests on BSC and then recliner; pt wearing diaper on arrival which was soiled so assisted pt to [facility] and changed gown, pt required assist for pericare, pt then transferred to recliner    Balance Overall balance assessment: History of Falls                                         ADL either performed or assessed with clinical judgement   ADL Overall ADL's : Needs assistance/impaired Eating/Feeding: Set  up;Sitting   Grooming: Oral care;Wash/dry face;Minimal assistance;Sitting Grooming Details (indicate cue type and reason): for sequencing of task Upper Body Bathing: Minimal assistance;Sitting   Lower Body Bathing: Sit to/from stand;Minimal assistance   Upper Body Dressing : Minimal assistance;Sitting   Lower Body Dressing: Minimal assistance;Sit to/from stand   Toilet Transfer: RW;Minimal Dentist Details (indicate cue type and reason): patient was able to transfer to recliner Toileting- Architect and Hygiene: Sit to/from stand;Moderate assistance Toileting - Clothing Manipulation Details (indicate cue type and reason): patient was noted to have soiled pad under her on recliner. patient appeared to have no awareness at this time. patient reuqired min A for hygiene in standing. woudl anticipate more assistance if actually been toileting.     Functional mobility during ADLs: Minimal assistance;Rolling walker;Cueing for safety;Cueing for sequencing       Vision Patient Visual Report: No change from baseline       Perception     Praxis      Pertinent Vitals/Pain Pain Assessment: No/denies pain     Hand Dominance Right   Extremity/Trunk Assessment Upper Extremity Assessment Upper Extremity Assessment: Generalized weakness   Lower Extremity Assessment Lower Extremity Assessment: Defer to PT evaluation   Cervical / Trunk Assessment Cervical / Trunk Assessment: Normal   Communication Communication Communication: No difficulties   Cognition Arousal/Alertness: Awake/alert Behavior During Therapy: WFL for tasks assessed/performed Overall Cognitive Status: History of cognitive impairments - at baseline  General Comments: hx dementia, able to communicate with simple brief commands/sentences   General Comments       Exercises     Shoulder Instructions      Home Living Family/patient expects  to be discharged to:: Assisted living                             Home Equipment: Walker - 2 wheels;Cane - single point   Additional Comments: Patient unreliable historian. Suspect needs assistance for mobility and ADLs - for safety.      Prior Functioning/Environment Level of Independence: Needs assistance  Gait / Transfers Assistance Needed: poor historian however appears ambulatory ADL's / Homemaking Assistance Needed: likely requires at least supervision if not assist - from memory care unit Communication / Swallowing Assistance Needed: speaks spanish/engish Comments: Pt from memory care, was living at home prior to this. Pt gave above history to PT, pt is a poor historian with history of dementia        OT Problem List: Decreased strength;Impaired balance (sitting and/or standing);Decreased range of motion;Decreased activity tolerance;Decreased coordination;Decreased knowledge of use of DME or AE;Decreased safety awareness      OT Treatment/Interventions: Self-care/ADL training;DME and/or AE instruction;Therapeutic activities;Balance training;Therapeutic exercise;Energy conservation;Patient/family education    OT Goals(Current goals can be found in the care plan section) Acute Rehab OT Goals Patient Stated Goal: none stated OT Goal Formulation: With patient Time For Goal Achievement: 09/06/20 Potential to Achieve Goals: Good  OT Frequency: Min 2X/week   Barriers to D/C:            Co-evaluation              AM-PAC OT "6 Clicks" Daily Activity     Outcome Measure Help from another person eating meals?: A Little Help from another person taking care of personal grooming?: A Little Help from another person toileting, which includes using toliet, bedpan, or urinal?: A Lot Help from another person bathing (including washing, rinsing, drying)?: A Lot Help from another person to put on and taking off regular upper body clothing?: A Little Help from another  person to put on and taking off regular lower body clothing?: A Lot 6 Click Score: 15   End of Session Equipment Utilized During Treatment: Gait belt;Rolling walker Nurse Communication: Other (comment) (nurse cleared patient to participate.)  Activity Tolerance: Patient tolerated treatment well Patient left: in bed;with call bell/phone within reach                   Time: 1500-1516 OT Time Calculation (min): 16 min Charges:  OT General Charges $OT Visit: 1 Visit OT Evaluation $OT Eval Moderate Complexity: 1 Mod  Sharyn Blitz OTR/L, MS Acute Rehabilitation Department Office# [phone] Pager# [phone]   Chalmers Guest Sindy Mccune 08/23/2020, 4:32 PM

## 2020-08-24 DIAGNOSIS — E1165 Type 2 diabetes mellitus with hyperglycemia: Secondary | ICD-10-CM

## 2020-08-24 DIAGNOSIS — U071 COVID-19: Secondary | ICD-10-CM

## 2020-08-24 DIAGNOSIS — Z794 Long term (current) use of insulin: Secondary | ICD-10-CM

## 2020-08-24 DIAGNOSIS — D696 Thrombocytopenia, unspecified: Secondary | ICD-10-CM

## 2020-08-24 DIAGNOSIS — E876 Hypokalemia: Secondary | ICD-10-CM

## 2020-08-24 LAB — COMPREHENSIVE METABOLIC PANEL
ALT: 21 U/L (ref 0–44)
AST: 27 U/L (ref 15–41)
Albumin: 2.9 g/dL — ABNORMAL LOW (ref 3.5–5.0)
Alkaline Phosphatase: 77 U/L (ref 38–126)
Anion gap: 7 (ref 5–15)
BUN: 12 mg/dL (ref 8–23)
CO2: 23 mmol/L (ref 22–32)
Calcium: 8 mg/dL — ABNORMAL LOW (ref 8.9–10.3)
Chloride: 110 mmol/L (ref 98–111)
Creatinine, Ser: 0.62 mg/dL (ref 0.44–1.00)
GFR, Estimated: >60 mL/min (ref >60)
Glucose, Bld: 82 mg/dL (ref 70–99)
Potassium: 3.6 mmol/L (ref 3.5–5.1)
Sodium: 140 mmol/L (ref 135–145)
Total Bilirubin: 0.6 mg/dL (ref 0.3–1.2)
Total Protein: 6 g/dL — ABNORMAL LOW (ref 6.5–8.1)

## 2020-08-24 LAB — MAGNESIUM: Magnesium: 1.1 mg/dL — ABNORMAL LOW (ref 1.7–2.4)

## 2020-08-24 LAB — CBC
HCT: 39.3 % (ref 36.0–46.0)
Hemoglobin: 12.6 g/dL (ref 12.0–15.0)
MCH: 24.7 pg — ABNORMAL LOW (ref 26.0–34.0)
MCHC: 32.1 g/dL (ref 30.0–36.0)
MCV: 76.9 fL — ABNORMAL LOW (ref 80.0–100.0)
Platelets: 116 10*3/uL — ABNORMAL LOW (ref 150–400)
RBC: 5.11 MIL/uL (ref 3.87–5.11)
RDW: 16 % — ABNORMAL HIGH (ref 11.5–15.5)
WBC: 6.8 10*3/uL (ref 4.0–10.5)
nRBC: 0 % (ref 0.0–0.2)

## 2020-08-24 LAB — CORTISOL-AM, BLOOD: Cortisol - AM: 6.2 ug/dL — ABNORMAL LOW (ref 6.7–22.6)

## 2020-08-24 LAB — URINE CULTURE: Culture: >=100000 — AB

## 2020-08-24 LAB — GLUCOSE, CAPILLARY
Glucose-Capillary: 94 mg/dL (ref 70–99)
Glucose-Capillary: 207 mg/dL — ABNORMAL HIGH (ref 70–99)
Glucose-Capillary: 246 mg/dL — ABNORMAL HIGH (ref 70–99)
Glucose-Capillary: 279 mg/dL — ABNORMAL HIGH (ref 70–99)

## 2020-08-24 LAB — PHOSPHORUS: Phosphorus: 3.7 mg/dL (ref 2.5–4.6)

## 2020-08-24 MED ORDER — INSULIN ASPART 100 UNIT/ML IJ SOLN
4.0000 [IU] | Freq: Three times a day (TID) | INTRAMUSCULAR | Status: DC
  Administered 2020-08-25 (×2): 4 [IU] via SUBCUTANEOUS

## 2020-08-24 MED ORDER — POTASSIUM CHLORIDE CRYS ER 20 MEQ PO TBCR
40.0000 meq | EXTENDED_RELEASE_TABLET | Freq: Once | ORAL | Status: AC
  Administered 2020-08-24: 40 meq via ORAL
  Filled 2020-08-24: qty 2

## 2020-08-24 MED ORDER — INSULIN ASPART 100 UNIT/ML IJ SOLN
0.0000 [IU] | Freq: Every day | INTRAMUSCULAR | Status: DC
  Administered 2020-08-24: 3 [IU] via SUBCUTANEOUS
  Administered 2020-08-28: 2 [IU] via SUBCUTANEOUS
  Administered 2020-08-30 – 2020-09-01 (×2): 4 [IU] via SUBCUTANEOUS

## 2020-08-24 MED ORDER — INSULIN ASPART 100 UNIT/ML IJ SOLN
0.0000 [IU] | Freq: Three times a day (TID) | INTRAMUSCULAR | Status: DC
  Administered 2020-08-25: 2 [IU] via SUBCUTANEOUS
  Administered 2020-08-25: 5 [IU] via SUBCUTANEOUS
  Administered 2020-08-25 – 2020-08-26 (×2): 3 [IU] via SUBCUTANEOUS
  Administered 2020-08-26: 5 [IU] via SUBCUTANEOUS
  Administered 2020-08-27: 3 [IU] via SUBCUTANEOUS
  Administered 2020-08-27: 5 [IU] via SUBCUTANEOUS
  Administered 2020-08-27: 8 [IU] via SUBCUTANEOUS
  Administered 2020-08-28: 3 [IU] via SUBCUTANEOUS
  Administered 2020-08-28 (×2): 8 [IU] via SUBCUTANEOUS
  Administered 2020-08-29: 5 [IU] via SUBCUTANEOUS
  Administered 2020-08-29: 3 [IU] via SUBCUTANEOUS
  Administered 2020-08-29 – 2020-08-30 (×2): 8 [IU] via SUBCUTANEOUS
  Administered 2020-08-30: 3 [IU] via SUBCUTANEOUS
  Administered 2020-08-30: 11 [IU] via SUBCUTANEOUS
  Administered 2020-08-31: 5 [IU] via SUBCUTANEOUS
  Administered 2020-08-31: 15 [IU] via SUBCUTANEOUS
  Administered 2020-08-31: 5 [IU] via SUBCUTANEOUS
  Administered 2020-09-01: 15 [IU] via SUBCUTANEOUS
  Administered 2020-09-01: 5 [IU] via SUBCUTANEOUS
  Administered 2020-09-01: 15 [IU] via SUBCUTANEOUS
  Administered 2020-09-02: 3 [IU] via SUBCUTANEOUS
  Administered 2020-09-02: 8 [IU] via SUBCUTANEOUS
  Administered 2020-09-02: 11 [IU] via SUBCUTANEOUS

## 2020-08-24 MED ORDER — MAGNESIUM SULFATE 4 GM/100ML IV SOLN
4.0000 g | Freq: Once | INTRAVENOUS | Status: AC
  Administered 2020-08-24: 4 g via INTRAVENOUS
  Filled 2020-08-24: qty 100

## 2020-08-24 NOTE — Progress Notes (Signed)
PROGRESS NOTE  Emma Maldonado ZDG:[id number] DOB: 09/11/1950   PCP: Patient, No Pcp Per (Inactive)  Patient is from: ALF at [facility] house  DOA: 08/22/2020 LOS: 1  Chief complaints:  Chief Complaint  Patient presents with   Shortness of Breath     Brief Narrative / Interim history: 70 year old SNF resident with a history of HTN, dementia, IDDM-2, hypothyroidism, adrenal insufficiency on Florinef, and renal transplant on chronic immunosuppressants who was transported to the ER from her facility after she was found to have "low blood pressures."  EMS noted pressures of 60/40 on arrival as well as a blood sugar of 51.  There was no reported loss of consciousness,fevers, chills or falls.  She was admitted for hypotension, hypoglycemia and AKI.  Also tested positive for COVID-19 but did not require treatment.  Still with significant electrolyte derangement including hypomagnesemia and hypokalemia.  Otherwise, she is a stable.  Subjective: Seen and examined earlier this morning.  No major events overnight of this morning.  No complaints.  She is awake and alert.  She is sitting on bedside chair.  She could not tell me her name but follows command.   Objective: Vitals:   08/24/20 0209 08/24/20 0451 08/24/20 0500 08/24/20 1414  BP: (!) 114/47 137/64  (!) 180/71  Pulse: 80 77  76  Resp: 14 15  18   Temp: 98.7 F (37.1 C) 98.6 F (37 C)  98.5 F (36.9 C)  TempSrc: Oral Oral  Oral  SpO2: 97% 97%  96%  Weight:   67.2 kg   Height:        Intake/Output Summary (Last 24 hours) at 08/24/2020 1625 Last data filed at 08/24/2020 1610 Gross per 24 hour  Intake 1274 ml  Output --  Net 1274 ml   Filed Weights   08/23/20 0419 08/23/20 0532 08/24/20 0500  Weight: 70.1 kg 67.4 kg 67.2 kg    Examination:  GENERAL: No apparent distress.  Nontoxic. HEENT: MMM.  Vision and hearing grossly intact.  NECK: Supple.  No apparent JVD.  RESP: On RA.  No IWOB.  Fair aeration bilaterally. CVS:   RRR. Heart sounds normal.  ABD/GI/GU: BS+. Abd soft, NTND.  MSK/EXT:  Moves extremities. No apparent deformity. No edema.  SKIN: no apparent skin lesion or wound NEURO: Awake and alert.  Oriented x0.  Follows commands.  No apparent focal neuro deficit. PSYCH: Calm. Normal affect.   Procedures:  None  Microbiology summarized:   Assessment & Plan: Hypotension-history of autonomic instability and adrenal insufficiency on Florinef at home.  Resolved. -Continue home Florinef -Continue holding losartan  AKI of transplanted kidney: Resolved. Recent Labs    05/01/20 0931 05/01/20 1732 05/02/20 0408 05/03/20 0348 05/04/20 0317 05/05/20 0402 05/06/20 1129 08/17/20 0910 08/22/20 1106 08/22/20 1533 08/23/20 0554 08/24/20 0433  BUN 13  --  13 12 16 16 17 12 14   --  16 12  CREATININE 0.85   < > 0.88 0.65 0.81 0.66 0.80 0.97 1.52* 1.07* 0.87 0.62   < > = values in this interval not displayed.  -Continue home Prograf and prednisone -Avoid nephrotoxic meds.   Hypomagnesemia/hypokalemia-unclear source of this.  Refeeding syndrome, she was hypoglycemic on presentation.  Not on diuretics at home.  K3.6.  Mg 1.1. -K-Dur 40 mill equivalent x1 -IV magnesium sulfate 4 g x 1  COVID-19 infection: No respiratory issues.  No indication for medications. -Continue isolation precaution for a total of 10 days   Uncontrolled DM-2 with hyperglycemia and hyperlipidemia:  A1c 7.3%. Recent Labs  Lab 08/23/20 1222 08/23/20 1702 08/24/20 0736 08/24/20 1139 08/24/20 1636  GLUCAP 149* 184* 94 207* 246*  -Continue SSI-moderate -Add mealtime coverage at 4 units 3 times daily starting tomorrow -We will resume basal insulin if indicated. -Continue statin.  Severe dementia without behavioral disturbance: Stable.  Patient is oriented x0 but follows command. -Reorientation and delirium precautions -Continue Seroquel and Namenda.  Hypothyroidism -Continue usual Synthroid dose   Depression -Continue  usual Zoloft dose  Thrombocytopenia: Acute.  Due to COVID-19? Recent Labs  Lab 08/22/20 1106 08/22/20 1533 08/23/20 0554 08/24/20 0433  PLT 246 248 236 116*  -Recheck in the morning.   Body mass index is 27.1 kg/m.        DVT prophylaxis:  heparin injection 5,000 Units Start: 08/22/20 2200 SCDs Start: 08/22/20 1533  Code Status: Full code Family Communication: Patient and/or RN. Available if any question.  Level of care: Telemetry Status is: Inpatient  Remains inpatient appropriate because:Persistent severe electrolyte disturbances and Unsafe d/c plan  Dispo: The patient is from: ALF at [facility] house              Anticipated d/c is to: ALF              Patient currently is not medically stable to d/c.   Difficult to place patient No       Consultants:  None   Sch Meds:  Scheduled Meds:  cholecalciferol  2,000 Units Oral Daily   fludrocortisone  0.1 mg Oral QHS   heparin  5,000 Units Subcutaneous Q8H   insulin aspart  0-15 Units Subcutaneous TID WC   insulin aspart  0-5 Units Subcutaneous QHS   levothyroxine  100 mcg Oral Q0600   memantine  10 mg Oral Q12H   predniSONE  5 mg Oral q morning   QUEtiapine  25 mg Oral QHS   rosuvastatin  5 mg Oral QHS   tacrolimus  0.5 mg Oral Daily   Continuous Infusions: PRN Meds:.acetaminophen **OR** [DISCONTINUED] acetaminophen, alum & mag hydroxide-simeth, guaiFENesin, ondansetron **OR** ondansetron (ZOFRAN) IV  Antimicrobials: Anti-infectives (From admission, onward)    None        I have personally reviewed the following labs and images: CBC: Recent Labs  Lab 08/22/20 1106 08/22/20 1533 08/23/20 0554 08/24/20 0433  WBC 9.2 7.7 6.9 6.8  NEUTROABS 7.4  --   --   --   HGB 13.7 13.0 12.7 12.6  HCT 43.2 40.6 39.5 39.3  MCV 77.1* 77.2* 77.8* 76.9*  PLT 246 248 236 116*   BMP &GFR Recent Labs  Lab 08/22/20 1106 08/22/20 1533 08/23/20 0554 08/24/20 0433  NA 141  --  143 140  K 3.6  --  3.0* 3.6   CL 104  --  111 110  CO2 24  --  22 23  GLUCOSE 127*  --  108* 82  BUN 14  --  16 12  CREATININE 1.52* 1.07* 0.87 0.62  CALCIUM 8.3*  --  7.9* 8.0*  MG  --  0.9* 1.4* 1.1*  PHOS  --  3.5  --  3.7   Estimated Creatinine Clearance: 57.9 mL/min (by C-G formula based on SCr of 0.62 mg/dL). Liver & Pancreas: Recent Labs  Lab 08/22/20 1106 08/24/20 0433  AST 34 27  ALT 19 21  ALKPHOS 95 77  BILITOT 0.9 0.6  PROT 7.5 6.0*  ALBUMIN 3.7 2.9*   No results for input(s): LIPASE, AMYLASE in the last 168 hours.  No results for input(s): AMMONIA in the last 168 hours. Diabetic: Recent Labs    08/22/20 1533  HGBA1C 7.3*   Recent Labs  Lab 08/23/20 0805 08/23/20 1222 08/23/20 1702 08/24/20 0736 08/24/20 1139  GLUCAP 75 149* 184* 94 207*   Cardiac Enzymes: No results for input(s): CKTOTAL, CKMB, CKMBINDEX, TROPONINI in the last 168 hours. No results for input(s): PROBNP in the last 8760 hours. Coagulation Profile: Recent Labs  Lab 08/22/20 1106  INR 1.0   Thyroid Function Tests: Recent Labs    08/22/20 1534  TSH 1.404   Lipid Profile: No results for input(s): CHOL, HDL, LDLCALC, TRIG, CHOLHDL, LDLDIRECT in the last 72 hours. Anemia Panel: No results for input(s): VITAMINB12, FOLATE, FERRITIN, TIBC, IRON, RETICCTPCT in the last 72 hours. Urine analysis:    Component Value Date/Time   COLORURINE YELLOW 08/22/2020 1106   APPEARANCEUR HAZY (A) 08/22/2020 1106   LABSPEC 1.003 (L) 08/22/2020 1106   PHURINE 6.0 08/22/2020 1106   GLUCOSEU NEGATIVE 08/22/2020 1106   HGBUR SMALL (A) 08/22/2020 1106   BILIRUBINUR NEGATIVE 08/22/2020 1106   KETONESUR NEGATIVE 08/22/2020 1106   PROTEINUR NEGATIVE 08/22/2020 1106   UROBILINOGEN 1.0 08/14/2012 1022   NITRITE NEGATIVE 08/22/2020 1106   LEUKOCYTESUR NEGATIVE 08/22/2020 1106   Sepsis Labs: Invalid input(s): PROCALCITONIN, LACTICIDVEN  Microbiology: Recent Results (from the past 240 hour(s))  Blood culture (routine single)      Status: None   Collection Time: 08/17/20  9:10 AM   Specimen: BLOOD RIGHT HAND  Result Value Ref Range Status   Specimen Description BLOOD RIGHT HAND  Final   Special Requests   Final    BOTTLES DRAWN AEROBIC AND ANAEROBIC Blood Culture results may not be optimal due to an inadequate volume of blood received in culture bottles   Culture   Final    NO GROWTH 5 DAYS Performed at [facility], [address] [place], [place] [address]    Report Status 08/22/2020 FINAL  Final  Urine Culture     Status: Abnormal   Collection Time: 08/22/20 11:06 AM   Specimen: In/Out Cath Urine  Result Value Ref Range Status   Specimen Description   Final    IN/OUT CATH URINE Performed at [facility], [address] [place], [place] [address]    Special Requests   Final    NONE Performed at [facility], [address] [place], [place] [address]    Culture (A)  Final    >=100,000 COLONIES/mL AEROCOCCUS SPECIES Standardized susceptibility testing for this organism is not available. Performed at [facility], [address] [place], [place] [address]    Report Status 08/24/2020 FINAL  Final  Blood Culture (routine x 2)     Status: None (Preliminary result)   Collection Time: 08/22/20 11:43 AM   Specimen: BLOOD  Result Value Ref Range Status   Specimen Description   Final    BLOOD RIGHT ANTECUBITAL Performed at [facility], [address] [place], [place] [address]    Special Requests   Final    BOTTLES DRAWN AEROBIC AND ANAEROBIC Blood Culture adequate volume Performed at [facility], [address] [place], [place] [address]    Culture   Final    NO GROWTH 2 DAYS Performed at [facility], [address] [place], [place] [address]    Report Status PENDING  Incomplete  Blood Culture (routine x 2)  Status: None (Preliminary result)   Collection Time: 08/22/20 11:50 AM   Specimen:  BLOOD  Result Value Ref Range Status   Specimen Description   Final    BLOOD LEFT ANTECUBITAL Performed at [facility], [address] [place], [place] [address]    Special Requests   Final    BOTTLES DRAWN AEROBIC AND ANAEROBIC Blood Culture results may not be optimal due to an inadequate volume of blood received in culture bottles Performed at [facility], [address], [place], [place] [address]    Culture   Final    NO GROWTH 2 DAYS Performed at [facility], [address], [place], [place] [address]    Report Status PENDING  Incomplete  Resp Panel by RT-PCR (Flu A&B, Covid) Nasopharyngeal Swab     Status: Abnormal   Collection Time: 08/22/20  4:29 PM   Specimen: Nasopharyngeal Swab; Nasopharyngeal(NP) swabs in vial transport medium  Result Value Ref Range Status   SARS Coronavirus 2 by RT PCR POSITIVE (A) NEGATIVE Final    Comment: RESULT CALLED TO, READ BACK BY AND VERIFIED WITH: L.DAVIDSON, RN AT 1943 ON 08.11.22 BY N.THOMPSON (NOTE) SARS-CoV-2 target nucleic acids are DETECTED.  The SARS-CoV-2 RNA is generally detectable in upper respiratory specimens during the acute phase of infection. Positive results are indicative of the presence of the identified virus, but do not rule out bacterial infection or co-infection with other pathogens not detected by the test. Clinical correlation with patient history and other diagnostic information is necessary to determine patient infection status. The expected result is Negative.  Fact Sheet for Patients: BloggerCourse.com  Fact Sheet for Healthcare Providers: SeriousBroker.it  This test is not yet approved or cleared by the [place] FDA and  has been authorized for detection and/or diagnosis of SARS-CoV-2 by FDA under an Emergency Use Authorization (EUA).  This EUA will remain in effect (meaning t his test can be used) for the  duration of  the COVID-19 declaration under Section 564(b)(1) of the Act, 21 U.S.C. section 360bbb-3(b)(1), unless the authorization is terminated or revoked sooner.     Influenza A by PCR NEGATIVE NEGATIVE Final   Influenza B by PCR NEGATIVE NEGATIVE Final    Comment: (NOTE) The Xpert Xpress SARS-CoV-2/FLU/RSV plus assay is intended as an aid in the diagnosis of influenza from Nasopharyngeal swab specimens and should not be used as a sole basis for treatment. Nasal washings and aspirates are unacceptable for Xpert Xpress SARS-CoV-2/FLU/RSV testing.  Fact Sheet for Patients: BloggerCourse.com  Fact Sheet for Healthcare Providers: SeriousBroker.it  This test is not yet approved or cleared by the [place] FDA and has been authorized for detection and/or diagnosis of SARS-CoV-2 by FDA under an Emergency Use Authorization (EUA). This EUA will remain in effect (meaning this test can be used) for the duration of the COVID-19 declaration under Section 564(b)(1) of the Act, 21 U.S.C. section 360bbb-3(b)(1), unless the authorization is terminated or revoked.  Performed at [facility], [address], [place], [place] [address]     Radiology Studies: No results found.     Sharne Linders T. Galen Malkowski [facility]  If 7PM-7AM, please contact night-coverage [email or web address] 08/24/2020, 4:25 PM

## 2020-08-25 DIAGNOSIS — R8271 Bacteriuria: Secondary | ICD-10-CM

## 2020-08-25 LAB — CBC
HCT: 40.9 % (ref 36.0–46.0)
Hemoglobin: 13 g/dL (ref 12.0–15.0)
MCH: 24.7 pg — ABNORMAL LOW (ref 26.0–34.0)
MCHC: 31.8 g/dL (ref 30.0–36.0)
MCV: 77.8 fL — ABNORMAL LOW (ref 80.0–100.0)
Platelets: 258 10*3/uL (ref 150–400)
RBC: 5.26 MIL/uL — ABNORMAL HIGH (ref 3.87–5.11)
RDW: 15.9 % — ABNORMAL HIGH (ref 11.5–15.5)
WBC: 7 10*3/uL (ref 4.0–10.5)
nRBC: 0 % (ref 0.0–0.2)

## 2020-08-25 LAB — RENAL FUNCTION PANEL
Albumin: 3.2 g/dL — ABNORMAL LOW (ref 3.5–5.0)
Anion gap: 6 (ref 5–15)
BUN: 11 mg/dL (ref 8–23)
CO2: 26 mmol/L (ref 22–32)
Calcium: 8.9 mg/dL (ref 8.9–10.3)
Chloride: 109 mmol/L (ref 98–111)
Creatinine, Ser: 0.57 mg/dL (ref 0.44–1.00)
GFR, Estimated: >60 mL/min (ref >60)
Glucose, Bld: 197 mg/dL — ABNORMAL HIGH (ref 70–99)
Phosphorus: 2.9 mg/dL (ref 2.5–4.6)
Potassium: 4.5 mmol/L (ref 3.5–5.1)
Sodium: 141 mmol/L (ref 135–145)

## 2020-08-25 LAB — MAGNESIUM: Magnesium: 1.6 mg/dL — ABNORMAL LOW (ref 1.7–2.4)

## 2020-08-25 LAB — GLUCOSE, CAPILLARY
Glucose-Capillary: 136 mg/dL — ABNORMAL HIGH (ref 70–99)
Glucose-Capillary: 225 mg/dL — ABNORMAL HIGH (ref 70–99)
Glucose-Capillary: 162 mg/dL — ABNORMAL HIGH (ref 70–99)
Glucose-Capillary: 140 mg/dL — ABNORMAL HIGH (ref 70–99)

## 2020-08-25 MED ORDER — INSULIN DETEMIR 100 UNIT/ML ~~LOC~~ SOLN
5.0000 [IU] | Freq: Two times a day (BID) | SUBCUTANEOUS | Status: DC
  Administered 2020-08-25 – 2020-08-26 (×2): 5 [IU] via SUBCUTANEOUS
  Filled 2020-08-25 (×2): qty 0.05

## 2020-08-25 MED ORDER — ADULT MULTIVITAMIN W/MINERALS CH
1.0000 | ORAL_TABLET | Freq: Every day | ORAL | Status: DC
  Administered 2020-08-25 – 2020-09-02 (×9): 1 via ORAL
  Filled 2020-08-25 (×9): qty 1

## 2020-08-25 MED ORDER — MAGNESIUM SULFATE 2 GM/50ML IV SOLN
2.0000 g | Freq: Once | INTRAVENOUS | Status: AC
  Administered 2020-08-25: 2 g via INTRAVENOUS
  Filled 2020-08-25: qty 50

## 2020-08-25 MED ORDER — GLUCERNA SHAKE PO LIQD
237.0000 mL | Freq: Three times a day (TID) | ORAL | Status: DC
  Administered 2020-08-25 – 2020-08-29 (×9): 237 mL via ORAL
  Filled 2020-08-25 (×13): qty 237

## 2020-08-25 MED ORDER — INSULIN ASPART 100 UNIT/ML IJ SOLN
6.0000 [IU] | Freq: Three times a day (TID) | INTRAMUSCULAR | Status: DC
  Administered 2020-08-25 – 2020-08-27 (×5): 6 [IU] via SUBCUTANEOUS

## 2020-08-25 NOTE — Progress Notes (Signed)
Initial Nutrition Assessment  DOCUMENTATION CODES:  Not applicable  INTERVENTION:  Adjust diet to carb modified for high glucose and to allow for more food choices Glucerna Shake po TID, each supplement provides 220 kcal and 10 grams of protein MVI with minerals daily  NUTRITION DIAGNOSIS:  Increased nutrient needs related to acute illness as evidenced by estimated needs.  GOAL:  Patient will meet greater than or equal to 90% of their needs  MONITOR:  PO intake, Supplement acceptance, Labs, Weight trends  REASON FOR ASSESSMENT:  Malnutrition Screening Tool    ASSESSMENT:  70 year old female presented to ED from SNF with hypotension. History of HTN, dementia, DM type 2, hypothyroidism, and renal transplant on chronic immunosuppressants.  Pt found to have AKI in ED and altered electrolyte. Recently dx with COVID19 about 2 weeks prior.   Unable to obtain nutrition hx from pt due to dementia, only oriented to self. Discussed intake with NT, states pt consuming ~50% of meals today. Reviewed weight and overall appears stable with minor loss seen in the last 5 months.   Will recommend adjust diet to carb modified to allow for more choices and better control of sbg. Will also add glucerna to augment intake.  Nutritionally Relevant Medications: Scheduled Meds:  cholecalciferol  2,000 Units Oral Daily   insulin aspart  0-15 Units Subcutaneous TID WC   insulin aspart  0-5 Units Subcutaneous QHS   insulin aspart  6 Units Subcutaneous TID WC   insulin detemir  5 Units Subcutaneous BID   levothyroxine  100 mcg Oral Q0600   memantine  10 mg Oral Q12H   predniSONE  5 mg Oral q morning   rosuvastatin  5 mg Oral QHS   tacrolimus  0.5 mg Oral Daily   Continuous Infusions:  magnesium sulfate bolus IVPB     PRN Meds: alum & mag hydroxide-simeth, ondansetron  Labs Reviewed: Mg 1.6 SBG ranges from 136-279 mg/dL over the last 24 hours HgbA1c 7.3% (8/11)  NUTRITION - FOCUSED PHYSICAL  EXAM: Defer to in-person assessment  Diet Order:   Diet Order             Diet 2 gram sodium Room service appropriate? Yes; Fluid consistency: Thin  Diet effective now                   EDUCATION NEEDS:  No education needs have been identified at this time  Skin:  Skin Assessment: Reviewed RN Assessment  Last BM:  8/13  Height:  Ht Readings from Last 1 Encounters:  08/22/20 5\' 2"  (1.575 m)    Weight:  Wt Readings from Last 1 Encounters:  08/24/20 67.2 kg    Ideal Body Weight:  50 kg  BMI:  Body mass index is 27.1 kg/m.  Estimated Nutritional Needs:  Kcal:  1500-1700 kcal/d Protein:  75-85g/d Fluid:  1.6-1.8L/d   08/26/20, RD, LDN Clinical Dietitian Pager on Amion

## 2020-08-25 NOTE — Progress Notes (Signed)
PROGRESS NOTE  Emma Maldonado ZYS:[id number] DOB: November 02, 1950   PCP: Patient, No Pcp Per (Inactive)  Patient is from: ALF at [facility] house  DOA: 08/22/2020 LOS: 2  Chief complaints:  Chief Complaint  Patient presents with   Shortness of Breath     Brief Narrative / Interim history: 70 year old SNF resident with a history of HTN, dementia, IDDM-2, hypothyroidism, adrenal insufficiency on Florinef, and renal transplant on chronic immunosuppressants who was transported to the ER from her facility after she was found to have "low blood pressures."  EMS noted pressures of 60/40 on arrival as well as a blood sugar of 51.  There was no reported loss of consciousness,fevers, chills or falls.  She was admitted for hypotension, hypoglycemia and AKI.  Also tested positive for COVID-19 but did not require treatment.  Still with significant electrolyte derangement including hypomagnesemia and hypokalemia.  Otherwise, she is a stable for discharge to ALF.  Subjective: Seen and examined earlier this morning.  No major events overnight of this morning.  No complaints but not a reliable historian.  She is only oriented to self.  She responds no to pain.  Objective: Vitals:   08/24/20 1659 08/24/20 2127 08/25/20 0534 08/25/20 0900  BP: (!) 166/69 (!) 166/75 (!) 168/82 (!) 157/73  Pulse: 75 75 70 75  Resp:      Temp:  98.6 F (37 C) 98.4 F (36.9 C)   TempSrc:  Oral Oral   SpO2:  98% 91%   Weight:      Height:        Intake/Output Summary (Last 24 hours) at 08/25/2020 1358 Last data filed at 08/25/2020 0537 Gross per 24 hour  Intake 220 ml  Output --  Net 220 ml   Filed Weights   08/23/20 0419 08/23/20 0532 08/24/20 0500  Weight: 70.1 kg 67.4 kg 67.2 kg    Examination:   GENERAL: No apparent distress.  Nontoxic. HEENT: MMM.  Vision and hearing grossly intact.  NECK: Supple.  No apparent JVD.  RESP: On RA.  No IWOB.  Fair aeration bilaterally. CVS:  RRR. Heart sounds normal.   ABD/GI/GU: BS+. Abd soft, NTND.  MSK/EXT:  Moves extremities. No apparent deformity. No edema.  SKIN: no apparent skin lesion or wound NEURO: Awake and alert. Oriented to self.  Follows commands.  No apparent focal neuro deficit. PSYCH: Calm. Normal affect.   Procedures:  None  Microbiology summarized: 8/11-COVID-19 PCR positive. 8/11-blood culture NGTD. 8/11-urine culture with Aerococcus species.  Assessment & Plan: Hypotension-history of autonomic instability and adrenal insufficiency on Florinef at home.  Urine culture with Aerococcus species likely bacteriuria versus true UTI.  She is not febrile.  No leukocytosis. -Discontinue Florinef.  BP elevated today. -Continue holding losartan  Essential hypertension: BP elevated.  She is on losartan which is not a great choice if she has history of mineralocorticoid deficiency. -Discontinue Florinef. -I would avoid losartan if she really have true mineralocorticoid deficiency.  AKI of transplanted kidney: Resolved. Recent Labs    05/02/20 0408 05/03/20 0348 05/04/20 0317 05/05/20 0402 05/06/20 1129 08/17/20 0910 08/22/20 1106 08/22/20 1533 08/23/20 0554 08/24/20 0433 08/25/20 0336  BUN 13 12 16 16 17 12 14   --  16 12 11   CREATININE 0.88 0.65 0.81 0.66 0.80 0.97 1.52* 1.07* 0.87 0.62 0.57  -Continue home Prograf and prednisone -Avoid nephrotoxic meds.   Hypomagnesemia/hypokalemia-hypokalemia resolved.  Mg 1.6. -IV magnesium sulfate 2 g x 1  Bacteriuria: Urine culture with Aerococcus species.  Low  suspicion for UTI although patient is not a great historian.  She has no fever or leukocytosis.  Clinically improving without antibiotics. -No indication for treatment.  COVID-19 infection: No respiratory issues.  No indication for medications. -Continue isolation precaution for a total of 10 days   Uncontrolled DM-2 with hyperglycemia and hyperlipidemia: A1c 7.3%. Recent Labs  Lab 08/24/20 1139 08/24/20 1636 08/24/20 2129  08/25/20 0742 08/25/20 1237  GLUCAP 207* 246* 279* 136* 225*  -Continue SSI-moderate -Increase NovoLog from 4 to 6 units 3 times daily with meals -Add Levemir at 5 units twice daily -Continue statin.  Severe dementia without behavioral disturbance: Stable.  Only oriented to self.  Follows commands. -Reorientation and delirium precautions -Continue Seroquel and Namenda.  Hypothyroidism -Continue usual Synthroid dose   Depression -Continue usual Zoloft dose  Thrombocytopenia: Likely lab error or platelet clumping.  Resolved. Recent Labs  Lab 08/22/20 1106 08/22/20 1533 08/23/20 0554 08/24/20 0433 08/25/20 0336  PLT 246 248 236 116* 258     Body mass index is 27.1 kg/m.        DVT prophylaxis:  heparin injection 5,000 Units Start: 08/22/20 2200 SCDs Start: 08/22/20 1533  Code Status: Full code Family Communication: Patient and/or RN. Available if any question.  Level of care: Telemetry Status is: Inpatient  Remains inpatient appropriate because:Unsafe d/c plan  Dispo: The patient is from: ALF at [facility] house              Anticipated d/c is to: ALF              Patient currently is medically stable to d/c.   Difficult to place patient No       Consultants:  None   Sch Meds:  Scheduled Meds:  cholecalciferol  2,000 Units Oral Daily   heparin  5,000 Units Subcutaneous Q8H   insulin aspart  0-15 Units Subcutaneous TID WC   insulin aspart  0-5 Units Subcutaneous QHS   insulin aspart  4 Units Subcutaneous TID WC   levothyroxine  100 mcg Oral Q0600   memantine  10 mg Oral Q12H   predniSONE  5 mg Oral q morning   QUEtiapine  25 mg Oral QHS   rosuvastatin  5 mg Oral QHS   tacrolimus  0.5 mg Oral Daily   Continuous Infusions: PRN Meds:.acetaminophen **OR** [DISCONTINUED] acetaminophen, alum & mag hydroxide-simeth, guaiFENesin, ondansetron **OR** ondansetron (ZOFRAN) IV  Antimicrobials: Anti-infectives (From admission, onward)    None         I have personally reviewed the following labs and images: CBC: Recent Labs  Lab 08/22/20 1106 08/22/20 1533 08/23/20 0554 08/24/20 0433 08/25/20 0336  WBC 9.2 7.7 6.9 6.8 7.0  NEUTROABS 7.4  --   --   --   --   HGB 13.7 13.0 12.7 12.6 13.0  HCT 43.2 40.6 39.5 39.3 40.9  MCV 77.1* 77.2* 77.8* 76.9* 77.8*  PLT 246 248 236 116* 258   BMP &GFR Recent Labs  Lab 08/22/20 1106 08/22/20 1533 08/23/20 0554 08/24/20 0433 08/25/20 0336  NA 141  --  143 140 141  K 3.6  --  3.0* 3.6 4.5  CL 104  --  111 110 109  CO2 24  --  GLUCOSE 127*  --  108* 82 197*  BUN 14  --  CREATININE 1.52* 1.07* 0.87 0.62 0.57  CALCIUM 8.3*  --  7.9* 8.0* 8.9  MG  --  0.9* 1.4* 1.1*  1.6*  PHOS  --  3.5  --  3.7 2.9   Estimated Creatinine Clearance: 57.9 mL/min (by C-G formula based on SCr of 0.57 mg/dL). Liver & Pancreas: Recent Labs  Lab 08/22/20 1106 08/24/20 0433 08/25/20 0336  AST 34 27  --   ALT 19 21  --   ALKPHOS 95 77  --   BILITOT 0.9 0.6  --   PROT 7.5 6.0*  --   ALBUMIN 3.7 2.9* 3.2*   No results for input(s): LIPASE, AMYLASE in the last 168 hours. No results for input(s): AMMONIA in the last 168 hours. Diabetic: Recent Labs    08/22/20 1533  HGBA1C 7.3*   Recent Labs  Lab 08/24/20 1139 08/24/20 1636 08/24/20 2129 08/25/20 0742 08/25/20 1237  GLUCAP 207* 246* 279* 136* 225*   Cardiac Enzymes: No results for input(s): CKTOTAL, CKMB, CKMBINDEX, TROPONINI in the last 168 hours. No results for input(s): PROBNP in the last 8760 hours. Coagulation Profile: Recent Labs  Lab 08/22/20 1106  INR 1.0   Thyroid Function Tests: Recent Labs    08/22/20 1534  TSH 1.404   Lipid Profile: No results for input(s): CHOL, HDL, LDLCALC, TRIG, CHOLHDL, LDLDIRECT in the last 72 hours. Anemia Panel: No results for input(s): VITAMINB12, FOLATE, FERRITIN, TIBC, IRON, RETICCTPCT in the last 72 hours. Urine analysis:    Component Value Date/Time    COLORURINE YELLOW 08/22/2020 1106   APPEARANCEUR HAZY (A) 08/22/2020 1106   LABSPEC 1.003 (L) 08/22/2020 1106   PHURINE 6.0 08/22/2020 1106   GLUCOSEU NEGATIVE 08/22/2020 1106   HGBUR SMALL (A) 08/22/2020 1106   BILIRUBINUR NEGATIVE 08/22/2020 1106   KETONESUR NEGATIVE 08/22/2020 1106   PROTEINUR NEGATIVE 08/22/2020 1106   UROBILINOGEN 1.0 08/14/2012 1022   NITRITE NEGATIVE 08/22/2020 1106   LEUKOCYTESUR NEGATIVE 08/22/2020 1106   Sepsis Labs: Invalid input(s): PROCALCITONIN, LACTICIDVEN  Microbiology: Recent Results (from the past 240 hour(s))  Blood culture (routine single)     Status: None   Collection Time: 08/17/20  9:10 AM   Specimen: BLOOD RIGHT HAND  Result Value Ref Range Status   Specimen Description BLOOD RIGHT HAND  Final   Special Requests   Final    BOTTLES DRAWN AEROBIC AND ANAEROBIC Blood Culture results may not be optimal due to an inadequate volume of blood received in culture bottles   Culture   Final    NO GROWTH 5 DAYS Performed at [facility], [address] [place], [place] [address]    Report Status 08/22/2020 FINAL  Final  Urine Culture     Status: Abnormal   Collection Time: 08/22/20 11:06 AM   Specimen: In/Out Cath Urine  Result Value Ref Range Status   Specimen Description   Final    IN/OUT CATH URINE Performed at [facility], [address] [place], [place] [address]    Special Requests   Final    NONE Performed at [facility], [address] [place], [place] [address]    Culture (A)  Final    >=100,000 COLONIES/mL AEROCOCCUS SPECIES Standardized susceptibility testing for this organism is not available. Performed at [facility], [address] [place], [place] [address]    Report Status 08/24/2020 FINAL  Final  Blood Culture (routine x 2)     Status: None (Preliminary result)   Collection Time: 08/22/20 11:43 AM   Specimen: BLOOD  Result Value Ref Range Status   Specimen  Description   Final  BLOOD RIGHT ANTECUBITAL Performed at [facility], [address] [place], [place] [address]    Special Requests   Final    BOTTLES DRAWN AEROBIC AND ANAEROBIC Blood Culture adequate volume Performed at [facility], [address], [place], [place] [address]    Culture   Final    NO GROWTH 3 DAYS Performed at [facility], [address], [place], [place] [address]    Report Status PENDING  Incomplete  Blood Culture (routine x 2)     Status: None (Preliminary result)   Collection Time: 08/22/20 11:50 AM   Specimen: BLOOD  Result Value Ref Range Status   Specimen Description   Final    BLOOD LEFT ANTECUBITAL Performed at [facility], [address] [place], [place] [address]    Special Requests   Final    BOTTLES DRAWN AEROBIC AND ANAEROBIC Blood Culture results may not be optimal due to an inadequate volume of blood received in culture bottles Performed at [facility], [address] [place], [place] [address]    Culture   Final    NO GROWTH 3 DAYS Performed at [facility], [address], [place], [place] [address]    Report Status PENDING  Incomplete  Resp Panel by RT-PCR (Flu A&B, Covid) Nasopharyngeal Swab     Status: Abnormal   Collection Time: 08/22/20  4:29 PM   Specimen: Nasopharyngeal Swab; Nasopharyngeal(NP) swabs in vial transport medium  Result Value Ref Range Status   SARS Coronavirus 2 by RT PCR POSITIVE (A) NEGATIVE Final    Comment: RESULT CALLED TO, READ BACK BY AND VERIFIED WITH: L.DAVIDSON, RN AT 1943 ON 08.11.22 BY N.THOMPSON (NOTE) SARS-CoV-2 target nucleic acids are DETECTED.  The SARS-CoV-2 RNA is generally detectable in upper respiratory specimens during the acute phase of infection. Positive results are indicative of the presence of the identified virus, but do not rule out bacterial infection or co-infection with other pathogens  not detected by the test. Clinical correlation with patient history and other diagnostic information is necessary to determine patient infection status. The expected result is Negative.  Fact Sheet for Patients: BloggerCourse.com  Fact Sheet for Healthcare Providers: SeriousBroker.it  This test is not yet approved or cleared by the [place] FDA and  has been authorized for detection and/or diagnosis of SARS-CoV-2 by FDA under an Emergency Use Authorization (EUA).  This EUA will remain in effect (meaning t his test can be used) for the duration of  the COVID-19 declaration under Section 564(b)(1) of the Act, 21 U.S.C. section 360bbb-3(b)(1), unless the authorization is terminated or revoked sooner.     Influenza A by PCR NEGATIVE NEGATIVE Final   Influenza B by PCR NEGATIVE NEGATIVE Final    Comment: (NOTE) The Xpert Xpress SARS-CoV-2/FLU/RSV plus assay is intended as an aid in the diagnosis of influenza from Nasopharyngeal swab specimens and should not be used as a sole basis for treatment. Nasal washings and aspirates are unacceptable for Xpert Xpress SARS-CoV-2/FLU/RSV testing.  Fact Sheet for Patients: BloggerCourse.com  Fact Sheet for Healthcare Providers: SeriousBroker.it  This test is not yet approved or cleared by the [place] FDA and has been authorized for detection and/or diagnosis of SARS-CoV-2 by FDA under an Emergency Use Authorization (EUA). This EUA will remain in effect (meaning this test can be used) for the duration of the COVID-19 declaration under Section 564(b)(1) of the Act, 21 U.S.C. section 360bbb-3(b)(1), unless the  authorization is terminated or revoked.  Performed at [facility], [address] [place], [place] [address]     Radiology Studies: No results found.     Libbie Bartley T. Gaspar Fowle [facility]  If  7PM-7AM, please contact night-coverage [email or web address] 08/25/2020, 1:58 PM

## 2020-08-26 LAB — MAGNESIUM: Magnesium: 1.6 mg/dL — ABNORMAL LOW (ref 1.7–2.4)

## 2020-08-26 LAB — GLUCOSE, CAPILLARY
Glucose-Capillary: 167 mg/dL — ABNORMAL HIGH (ref 70–99)
Glucose-Capillary: 201 mg/dL — ABNORMAL HIGH (ref 70–99)
Glucose-Capillary: 91 mg/dL (ref 70–99)
Glucose-Capillary: 172 mg/dL — ABNORMAL HIGH (ref 70–99)

## 2020-08-26 LAB — RENAL FUNCTION PANEL
Albumin: 3.3 g/dL — ABNORMAL LOW (ref 3.5–5.0)
Anion gap: 8 (ref 5–15)
BUN: 12 mg/dL (ref 8–23)
CO2: 25 mmol/L (ref 22–32)
Calcium: 9.8 mg/dL (ref 8.9–10.3)
Chloride: 106 mmol/L (ref 98–111)
Creatinine, Ser: 0.64 mg/dL (ref 0.44–1.00)
GFR, Estimated: >60 mL/min (ref >60)
Glucose, Bld: 154 mg/dL — ABNORMAL HIGH (ref 70–99)
Phosphorus: 3.9 mg/dL (ref 2.5–4.6)
Potassium: 4.2 mmol/L (ref 3.5–5.1)
Sodium: 139 mmol/L (ref 135–145)

## 2020-08-26 LAB — CBC
HCT: 42.7 % (ref 36.0–46.0)
Hemoglobin: 13.5 g/dL (ref 12.0–15.0)
MCH: 24.5 pg — ABNORMAL LOW (ref 26.0–34.0)
MCHC: 31.6 g/dL (ref 30.0–36.0)
MCV: 77.6 fL — ABNORMAL LOW (ref 80.0–100.0)
Platelets: 248 10*3/uL (ref 150–400)
RBC: 5.5 MIL/uL — ABNORMAL HIGH (ref 3.87–5.11)
RDW: 15.7 % — ABNORMAL HIGH (ref 11.5–15.5)
WBC: 8.9 10*3/uL (ref 4.0–10.5)
nRBC: 0 % (ref 0.0–0.2)

## 2020-08-26 MED ORDER — INSULIN DETEMIR 100 UNIT/ML ~~LOC~~ SOLN
8.0000 [IU] | Freq: Two times a day (BID) | SUBCUTANEOUS | Status: DC
  Administered 2020-08-26 – 2020-08-30 (×9): 8 [IU] via SUBCUTANEOUS
  Filled 2020-08-26 (×10): qty 0.08

## 2020-08-26 MED ORDER — GLUCERNA SHAKE PO LIQD: 237.0000 mL | Freq: Three times a day (TID) | ORAL | 0 refills | Status: DC

## 2020-08-26 MED ORDER — LANTUS SOLOSTAR 100 UNIT/ML ~~LOC~~ SOPN: 20.0000 [IU] | PEN_INJECTOR | Freq: Every morning | SUBCUTANEOUS | 11 refills | Status: DC

## 2020-08-26 MED ORDER — ADULT MULTIVITAMIN W/MINERALS CH: 1.0000 | ORAL_TABLET | Freq: Every day | ORAL | Status: AC

## 2020-08-26 NOTE — Progress Notes (Signed)
Physical Therapy Treatment Patient Details Name: Emma Maldonado MRN: [id number] DOB: October 30, 1950 Today's Date: 08/26/2020    History of Present Illness 70 y.o. female with medical history significant of hypertension, dementia, history of renal transplant on immunosuppressant followed by her nephrologist at [facility], brought by EMS from [facility] house for evaluation of low blood pressure.    PT Comments    Pt progressing, able to amb very short distance however limited by incontinence.  VSS, no notable dyspnea, no dizziness, pt reports feeling better  Follow Up Recommendations  No PT follow up;Supervision for mobility/OOB     Equipment Recommendations  None recommended by PT    Recommendations for Other Services       Precautions / Restrictions Precautions Precautions: Fall Restrictions Weight Bearing Restrictions: No    Mobility  Bed Mobility Overal bed mobility: Needs Assistance Bed Mobility: Supine to Sit     Supine to sit: Min guard;HOB elevated          Transfers Overall transfer level: Needs assistance Equipment used: Rolling walker (2 wheeled) Transfers: Sit to/from Stand Sit to Stand: Min guard;Min assist Stand pivot transfers: Min guard       General transfer comment: cues for hand placement, min/guard to min  to steady intermittently  Ambulation/Gait Ambulation/Gait assistance: Min assist Gait Distance (Feet): 5 Feet (x2) Assistive device: Rolling walker (2 wheeled) Gait Pattern/deviations: Step-through pattern;Decreased stride length;Narrow base of support     General Gait Details: steps bed to [facility] and BSC to recliner. slightly unsteady however no overt LOB. distance limited by stool incontinence   Stairs             Wheelchair Mobility    Modified Rankin (Stroke Patients Only)       Balance                                            Cognition Arousal/Alertness: Awake/alert Behavior During Therapy:  WFL for tasks assessed/performed Overall Cognitive Status: History of cognitive impairments - at baseline                                        Exercises      General Comments        Pertinent Vitals/Pain      Home Living                      Prior Function            PT Goals (current goals can now be found in the care plan section) Acute Rehab PT Goals Patient Stated Goal: none stated PT Goal Formulation: With patient Time For Goal Achievement: 09/06/20 Potential to Achieve Goals: Good Progress towards PT goals: Progressing toward goals    Frequency    Min 2X/week      PT Plan Current plan remains appropriate    Co-evaluation              AM-PAC PT "6 Clicks" Mobility   Outcome Measure  Help needed turning from your back to your side while in a flat bed without using bedrails?: A Little Help needed moving from lying on your back to sitting on the side of a flat bed without using bedrails?: A Little Help needed moving to  and from a bed to a chair (including a wheelchair)?: A Little Help needed standing up from a chair using your arms (e.g., wheelchair or bedside chair)?: A Little Help needed to walk in hospital room?: A Little Help needed climbing 3-5 steps with a railing? : A Little 6 Click Score: 18    End of Session Equipment Utilized During Treatment: Gait belt Activity Tolerance: Patient tolerated treatment well Patient left: in chair;with call bell/phone within reach;with chair alarm set Nurse Communication: Mobility status PT Visit Diagnosis: Difficulty in walking, not elsewhere classified (R26.2)     Time: 5638-7564 PT Time Calculation (min) (ACUTE ONLY): 21 min  Charges:  $Gait Training: 8-22 mins                     Delice Bison, PT  Acute Rehab Dept ([facility]) [phone] Pager [phone]  08/26/2020    [facility] 08/26/2020, 1:28 PM

## 2020-08-26 NOTE — Progress Notes (Signed)
PROGRESS NOTE  Emma Maldonado WUJ:[id number] DOB: 1950/12/14   PCP: Patient, No Pcp Per (Inactive)  Patient is from: ALF at [facility]  DOA: 08/22/2020 LOS: 3  Chief complaints:  Chief Complaint  Patient presents with   Shortness of Breath     Brief Narrative / Interim history: 70 year old SNF resident with a history of HTN, dementia, IDDM-2, hypothyroidism, adrenal insufficiency on Florinef, and renal transplant on chronic immunosuppressants who was transported to the ER from her facility after she was found to have "low blood pressures."  EMS noted pressures of 60/40 on arrival as well as a blood sugar of 51.  There was no reported loss of consciousness,fevers, chills or falls.  She was admitted for hypotension, hypoglycemia and AKI.  Also tested positive for COVID-19 but did not require treatment.  Still with significant electrolyte derangement including hypomagnesemia and hypokalemia.  Otherwise, she is a stable for discharge to ALF once she completes isolation precaution on 8/21.  Subjective: Seen and examined earlier this morning.  No major events overnight of this morning.  No complaints but not a great historian.  She is awake and alert but only oriented to self.  She responds no to pain.  Resting in bed comfortably.  Objective: Vitals:   08/25/20 0900 08/25/20 1416 08/25/20 1946 08/26/20 0443  BP: (!) 157/73 (!) 168/82 (!) 165/84 111/61  Pulse: 75 76 82 72  Resp:  Temp:  98.1 F (36.7 C) 98.4 F (36.9 C) 98.6 F (37 C)  TempSrc:  Oral Oral Oral  SpO2:  96% 98% 94%  Weight:      Height:        Intake/Output Summary (Last 24 hours) at 08/26/2020 1235 Last data filed at 08/25/2020 1946 Gross per 24 hour  Intake 284.24 ml  Output --  Net 284.24 ml   Filed Weights   08/23/20 0419 08/23/20 0532 08/24/20 0500  Weight: 70.1 kg 67.4 kg 67.2 kg    Examination:  GENERAL: No apparent distress.  Nontoxic. HEENT: MMM.  Vision and hearing grossly intact.   NECK: Supple.  No apparent JVD.  RESP: On RA.  No IWOB.  Fair aeration bilaterally. CVS:  RRR. Heart sounds normal.  ABD/GI/GU: BS+. Abd soft, NTND.  MSK/EXT:  Moves extremities. No apparent deformity. No edema.  SKIN: no apparent skin lesion or wound NEURO: Awake and alert. Oriented only to self.  Follows commands.  No apparent focal neuro deficit. PSYCH: Calm. Normal affect.   Procedures:  None  Microbiology summarized: 8/11-COVID-19 PCR positive. 8/11-blood culture NGTD. 8/11-urine culture with Aerococcus species.  Assessment & Plan: Hypotension-history of autonomic instability and adrenal insufficiency on Florinef at home.  Urine culture with Aerococcus species likely bacteriuria versus true UTI.  She is not febrile.  No leukocytosis. -Discontinued Florinef on 8/14. -Continue holding losartan  Essential hypertension: Normotensive. -Discontinued Florinef. -I would avoid losartan if she really have true mineralocorticoid deficiency.  AKI of transplanted kidney: Resolved. Recent Labs    05/03/20 0348 05/04/20 0317 05/05/20 0402 05/06/20 1129 08/17/20 0910 08/22/20 1106 08/22/20 1533 08/23/20 0554 08/24/20 0433 08/25/20 0336 08/26/20 0320  BUN --  CREATININE 0.65 0.81 0.66 0.80 0.97 1.52* 1.07* 0.87 0.62 0.57 0.64  -Continue home Prograf and prednisone -Avoid nephrotoxic meds.   Hypomagnesemia/hypokalemia-resolved.  Bacteriuria: Urine culture with Aerococcus species.  Low suspicion for UTI although patient is not a great historian.  She has no  fever or leukocytosis.  Clinically improving without antibiotics. -No indication for treatment.  COVID-19 infection: No respiratory issues.  No indication for medications. -Continue isolation precaution for a total of 10 days   Uncontrolled DM-2 with hyperglycemia and hyperlipidemia: A1c 7.3%. Recent Labs  Lab 08/25/20 1237 08/25/20 1804 08/25/20 2211 08/26/20 0804 08/26/20 1223   GLUCAP 225* 162* 140* 167* 201*  -Continue SSI-moderate -Continue NovoLog 6 units 3 times daily with meals -Increase Levemir from 5 to 8 units twice daily -Continue statin.  Severe dementia without behavioral disturbance: Stable.  Only oriented to self.  Follows commands. -Reorientation and delirium precautions -Continue Seroquel and Namenda.  Hypothyroidism -Continue usual Synthroid dose   Depression -Continue usual Zoloft dose  Thrombocytopenia: Likely lab error or platelet clumping.  Resolved. Recent Labs  Lab 08/22/20 1106 08/22/20 1533 08/23/20 0554 08/24/20 0433 08/25/20 0336 08/26/20 0320  PLT 246 248 236 116* 258 248   Increased nutrient needs Body mass index is 27.1 kg/m. Nutrition Problem: Increased nutrient needs Etiology: acute illness Signs/Symptoms: estimated needs Interventions: Refer to RD note for recommendations  DVT prophylaxis:  heparin injection 5,000 Units Start: 08/22/20 2200 SCDs Start: 08/22/20 1533  Code Status: Full code Family Communication: Patient and/or RN. Available if any question.  Level of care: Telemetry Status is: Inpatient  Remains inpatient appropriate because:Unsafe d/c plan  Dispo: The patient is from: ALF at [facility]              Anticipated d/c is to: ALF once she completes isolation precaution for COVID-19 on 8/21.              Patient currently is medically stable to d/c.   Difficult to place patient No       Consultants:  None   Sch Meds:  Scheduled Meds:  cholecalciferol  2,000 Units Oral Daily   feeding supplement (GLUCERNA SHAKE)  237 mL Oral TID BM   heparin  5,000 Units Subcutaneous Q8H   insulin aspart  0-15 Units Subcutaneous TID WC   insulin aspart  0-5 Units Subcutaneous QHS   insulin aspart  6 Units Subcutaneous TID WC   insulin detemir  5 Units Subcutaneous BID   levothyroxine  100 mcg Oral Q0600   memantine  10 mg Oral Q12H   multivitamin with minerals  1 tablet Oral Daily    predniSONE  5 mg Oral q morning   QUEtiapine  25 mg Oral QHS   rosuvastatin  5 mg Oral QHS   tacrolimus  0.5 mg Oral Daily   Continuous Infusions: PRN Meds:.acetaminophen **OR** [DISCONTINUED] acetaminophen, alum & mag hydroxide-simeth, guaiFENesin, ondansetron **OR** ondansetron (ZOFRAN) IV  Antimicrobials: Anti-infectives (From admission, onward)    None        I have personally reviewed the following labs and images: CBC: Recent Labs  Lab 08/22/20 1106 08/22/20 1533 08/23/20 0554 08/24/20 0433 08/25/20 0336 08/26/20 0320  WBC 9.2 7.7 6.9 6.8 7.0 8.9  NEUTROABS 7.4  --   --   --   --   --   HGB 13.7 13.0 12.7 12.6 13.0 13.5  HCT 43.2 40.6 39.5 39.3 40.9 42.7  MCV 77.1* 77.2* 77.8* 76.9* 77.8* 77.6*  PLT 246 248 236 116* 258 248   BMP &GFR Recent Labs  Lab 08/22/20 1106 08/22/20 1533 08/23/20 0554 08/24/20 0433 08/25/20 0336 08/26/20 0320  NA 141  --  143 140 141 139  K 3.6  --  3.0* 3.6 4.5 4.2  CL 104  --  111 110 109 106  CO2 24  --  22 23 26 25   GLUCOSE 127*  --  108* 82 197* 154*  BUN 14  --  16 12 11 12   CREATININE 1.52* 1.07* 0.87 0.62 0.57 0.64  CALCIUM 8.3*  --  7.9* 8.0* 8.9 9.8  MG  --  0.9* 1.4* 1.1* 1.6* 1.6*  PHOS  --  3.5  --  3.7 2.9 3.9   Estimated Creatinine Clearance: 57.9 mL/min (by C-G formula based on SCr of 0.64 mg/dL). Liver & Pancreas: Recent Labs  Lab 08/22/20 1106 08/24/20 0433 08/25/20 0336 08/26/20 0320  AST 34 27  --   --   ALT 19 21  --   --   ALKPHOS 95 77  --   --   BILITOT 0.9 0.6  --   --   PROT 7.5 6.0*  --   --   ALBUMIN 3.7 2.9* 3.2* 3.3*   No results for input(s): LIPASE, AMYLASE in the last 168 hours. No results for input(s): AMMONIA in the last 168 hours. Diabetic: No results for input(s): HGBA1C in the last 72 hours.  Recent Labs  Lab 08/25/20 1237 08/25/20 1804 08/25/20 2211 08/26/20 0804 08/26/20 1223  GLUCAP 225* 162* 140* 167* 201*   Cardiac Enzymes: No results for input(s): CKTOTAL,  CKMB, CKMBINDEX, TROPONINI in the last 168 hours. No results for input(s): PROBNP in the last 8760 hours. Coagulation Profile: Recent Labs  Lab 08/22/20 1106  INR 1.0   Thyroid Function Tests: No results for input(s): TSH, T4TOTAL, FREET4, T3FREE, THYROIDAB in the last 72 hours.  Lipid Profile: No results for input(s): CHOL, HDL, LDLCALC, TRIG, CHOLHDL, LDLDIRECT in the last 72 hours. Anemia Panel: No results for input(s): VITAMINB12, FOLATE, FERRITIN, TIBC, IRON, RETICCTPCT in the last 72 hours. Urine analysis:    Component Value Date/Time   COLORURINE YELLOW 08/22/2020 1106   APPEARANCEUR HAZY (A) 08/22/2020 1106   LABSPEC 1.003 (L) 08/22/2020 1106   PHURINE 6.0 08/22/2020 1106   GLUCOSEU NEGATIVE 08/22/2020 1106   HGBUR SMALL (A) 08/22/2020 1106   BILIRUBINUR NEGATIVE 08/22/2020 1106   KETONESUR NEGATIVE 08/22/2020 1106   PROTEINUR NEGATIVE 08/22/2020 1106   UROBILINOGEN 1.0 08/14/2012 1022   NITRITE NEGATIVE 08/22/2020 1106   LEUKOCYTESUR NEGATIVE 08/22/2020 1106   Sepsis Labs: Invalid input(s): PROCALCITONIN, LACTICIDVEN  Microbiology: Recent Results (from the past 240 hour(s))  Blood culture (routine single)     Status: None   Collection Time: 08/17/20  9:10 AM   Specimen: BLOOD RIGHT HAND  Result Value Ref Range Status   Specimen Description BLOOD RIGHT HAND  Final   Special Requests   Final    BOTTLES DRAWN AEROBIC AND ANAEROBIC Blood Culture results may not be optimal due to an inadequate volume of blood received in culture bottles   Culture   Final    NO GROWTH 5 DAYS Performed at [facility], [address] [place], [place] [address]    Report Status 08/22/2020 FINAL  Final  Urine Culture     Status: Abnormal   Collection Time: 08/22/20 11:06 AM   Specimen: In/Out Cath Urine  Result Value Ref Range Status   Specimen Description   Final    IN/OUT CATH URINE Performed at [facility] [place] [facility], [address] [place], [place]  [address]    Special Requests   Final    NONE Performed at [facility], [address] [place], [place] [address]  Culture (A)  Final    >=100,000 COLONIES/mL AEROCOCCUS SPECIES Standardized susceptibility testing for this organism is not available. Performed at [facility], [address] [place], [place] [address]    Report Status 08/24/2020 FINAL  Final  Blood Culture (routine x 2)     Status: None (Preliminary result)   Collection Time: 08/22/20 11:43 AM   Specimen: BLOOD  Result Value Ref Range Status   Specimen Description   Final    BLOOD RIGHT ANTECUBITAL Performed at [facility], [address], [place], [place] [address]    Special Requests   Final    BOTTLES DRAWN AEROBIC AND ANAEROBIC Blood Culture adequate volume Performed at [facility], [address] [place], [place] [address]    Culture   Final    NO GROWTH 4 DAYS Performed at [facility], [address], [place], [place] [address]    Report Status PENDING  Incomplete  Blood Culture (routine x 2)     Status: None (Preliminary result)   Collection Time: 08/22/20 11:50 AM   Specimen: BLOOD  Result Value Ref Range Status   Specimen Description   Final    BLOOD LEFT ANTECUBITAL Performed at [facility], [address] [place], [place] [address]    Special Requests   Final    BOTTLES DRAWN AEROBIC AND ANAEROBIC Blood Culture results may not be optimal due to an inadequate volume of blood received in culture bottles Performed at [facility], [address], [place], [place] [address]    Culture   Final    NO GROWTH 4 DAYS Performed at [facility], [address] [place], [place] [address]    Report Status PENDING  Incomplete  Resp Panel by RT-PCR (Flu A&B, Covid) Nasopharyngeal Swab     Status: Abnormal   Collection Time: 08/22/20  4:29 PM   Specimen: Nasopharyngeal Swab;  Nasopharyngeal(NP) swabs in vial transport medium  Result Value Ref Range Status   SARS Coronavirus 2 by RT PCR POSITIVE (A) NEGATIVE Final    Comment: RESULT CALLED TO, READ BACK BY AND VERIFIED WITH: L.DAVIDSON, RN AT 1943 ON 08.11.22 BY N.THOMPSON (NOTE) SARS-CoV-2 target nucleic acids are DETECTED.  The SARS-CoV-2 RNA is generally detectable in upper respiratory specimens during the acute phase of infection. Positive results are indicative of the presence of the identified virus, but do not rule out bacterial infection or co-infection with other pathogens not detected by the test. Clinical correlation with patient history and other diagnostic information is necessary to determine patient infection status. The expected result is Negative.  Fact Sheet for Patients: BloggerCourse.com  Fact Sheet for Healthcare Providers: SeriousBroker.it  This test is not yet approved or cleared by the [place] FDA and  has been authorized for detection and/or diagnosis of SARS-CoV-2 by FDA under an Emergency Use Authorization (EUA).  This EUA will remain in effect (meaning t his test can be used) for the duration of  the COVID-19 declaration under Section 564(b)(1) of the Act, 21 U.S.C. section 360bbb-3(b)(1), unless the authorization is terminated or revoked sooner.     Influenza A by PCR NEGATIVE NEGATIVE Final   Influenza B by PCR NEGATIVE NEGATIVE Final    Comment: (NOTE) The Xpert Xpress SARS-CoV-2/FLU/RSV plus assay is intended as an aid in the diagnosis of influenza from Nasopharyngeal swab specimens and should not be used as a sole basis for treatment. Nasal washings and aspirates  are unacceptable for Xpert Xpress SARS-CoV-2/FLU/RSV testing.  Fact Sheet for Patients: BloggerCourse.com  Fact Sheet for Healthcare Providers: SeriousBroker.it  This test is not yet approved or  cleared by the [place] FDA and has been authorized for detection and/or diagnosis of SARS-CoV-2 by FDA under an Emergency Use Authorization (EUA). This EUA will remain in effect (meaning this test can be used) for the duration of the COVID-19 declaration under Section 564(b)(1) of the Act, 21 U.S.C. section 360bbb-3(b)(1), unless the authorization is terminated or revoked.  Performed at [facility], [address] [place], [place] [address]     Radiology Studies: No results found.     Alexius Ellington T. Celica Kotowski [facility]  If 7PM-7AM, please contact night-coverage [email or web address] 08/26/2020, 12:35 PM

## 2020-08-27 LAB — RENAL FUNCTION PANEL
Albumin: 3.3 g/dL — ABNORMAL LOW (ref 3.5–5.0)
Anion gap: 9 (ref 5–15)
BUN: 21 mg/dL (ref 8–23)
CO2: 25 mmol/L (ref 22–32)
Calcium: 9.8 mg/dL (ref 8.9–10.3)
Chloride: 101 mmol/L (ref 98–111)
Creatinine, Ser: 0.85 mg/dL (ref 0.44–1.00)
GFR, Estimated: >60 mL/min (ref >60)
Glucose, Bld: 282 mg/dL — ABNORMAL HIGH (ref 70–99)
Phosphorus: 4.5 mg/dL (ref 2.5–4.6)
Potassium: 5 mmol/L (ref 3.5–5.1)
Sodium: 135 mmol/L (ref 135–145)

## 2020-08-27 LAB — CULTURE, BLOOD (ROUTINE X 2)
Culture: NO GROWTH
Culture: NO GROWTH
Special Requests: ADEQUATE

## 2020-08-27 LAB — GLUCOSE, CAPILLARY
Glucose-Capillary: 260 mg/dL — ABNORMAL HIGH (ref 70–99)
Glucose-Capillary: 214 mg/dL — ABNORMAL HIGH (ref 70–99)
Glucose-Capillary: 178 mg/dL — ABNORMAL HIGH (ref 70–99)
Glucose-Capillary: 93 mg/dL (ref 70–99)

## 2020-08-27 LAB — MAGNESIUM: Magnesium: 1.4 mg/dL — ABNORMAL LOW (ref 1.7–2.4)

## 2020-08-27 MED ORDER — INSULIN ASPART 100 UNIT/ML IJ SOLN
10.0000 [IU] | Freq: Three times a day (TID) | INTRAMUSCULAR | Status: DC
  Administered 2020-08-27 (×2): 10 [IU] via SUBCUTANEOUS

## 2020-08-27 MED ORDER — FLUDROCORTISONE ACETATE 0.1 MG PO TABS
0.1000 mg | ORAL_TABLET | Freq: Every day | ORAL | Status: DC
  Administered 2020-08-27 – 2020-09-02 (×7): 0.1 mg via ORAL
  Filled 2020-08-27 (×7): qty 1

## 2020-08-27 NOTE — Progress Notes (Signed)
PROGRESS NOTE  Emma Maldonado YQM:[id number] DOB: 1950/08/25   PCP: Patient, No Pcp Per (Inactive)  Patient is from: ALF at [facility]  DOA: 08/22/2020 LOS: 4  Chief complaints:  Chief Complaint  Patient presents with   Shortness of Breath     Brief Narrative / Interim history: 70 year old SNF resident with a history of HTN, dementia, IDDM-2, hypothyroidism, adrenal insufficiency on Florinef, and renal transplant on chronic immunosuppressants who was transported to the ER from her facility after she was found to have "low blood pressures."  EMS noted pressures of 60/40 on arrival as well as a blood sugar of 51.  There was no reported loss of consciousness,fevers, chills or falls.  She was admitted for hypotension, hypoglycemia and AKI.  Also tested positive for COVID-19 but did not require treatment.  Still with significant electrolyte derangement including hypomagnesemia and hypokalemia.  Otherwise, she is a stable for discharge to ALF once she completes isolation precaution on 8/21.  Subjective: Seen and examined earlier this morning with the help of video interpreter with ID number [id number].  Patient is awake and alert but not responding to orientation questions.  Responds to some of the questions in [place].  She follows commands.  Does not appear to be in distress.  Objective: Vitals:   08/26/20 1457 08/26/20 1950 08/27/20 0500 08/27/20 1401  BP: (!) 124/57 140/69 139/70 122/62  Pulse: 77 76 72 81  Resp: 18 18 18 18   Temp: 98.6 F (37 C) 98.8 F (37.1 C) 98.3 F (36.8 C)   TempSrc:  Oral Oral   SpO2: 97% 97% 95% 96%  Weight:      Height:       No intake or output data in the 24 hours ending 08/27/20 1600  Filed Weights   08/23/20 0419 08/23/20 0532 08/24/20 0500  Weight: 70.1 kg 67.4 kg 67.2 kg    Examination:  GENERAL: No apparent distress.  Nontoxic. HEENT: MMM.  Vision and hearing grossly intact.  NECK: Supple.  No apparent JVD.  RESP: On RA.  No IWOB.   Fair aeration bilaterally. CVS:  RRR. Heart sounds normal.  ABD/GI/GU: BS+. Abd soft, NTND.  MSK/EXT:  Moves extremities. No apparent deformity. No edema.  SKIN: no apparent skin lesion or wound NEURO: Awake and alert.  Does not respond to orientation questions.  Follows commands.  No apparent focal neuro deficit. PSYCH: Calm. Normal affect.   Procedures:  None  Microbiology summarized: 8/11-COVID-19 PCR positive. 8/11-blood culture NGTD. 8/11-urine culture with Aerococcus species.  Assessment & Plan: Hypotension-history of autonomic instability and adrenal insufficiency on Florinef at home.  Urine culture with Aerococcus species likely bacteriuria versus true UTI.  She is not febrile.  No leukocytosis. -Resume home Florinef. -Continue holding losartan  Essential hypertension: Normotensive. -Resume home Florinef. -I would avoid losartan if she really has true mineralocorticoid deficiency.  AKI of transplanted kidney: Resolved. Recent Labs    05/04/20 0317 05/05/20 0402 05/06/20 1129 08/17/20 0910 08/22/20 1106 08/22/20 1533 08/23/20 0554 08/24/20 0433 08/25/20 0336 08/26/20 0320 08/27/20 0943  BUN 16 16 17 12 14   --  16 12 11 12 21   CREATININE 0.81 0.66 0.80 0.97 1.52* 1.07* 0.87 0.62 0.57 0.64 0.85  -Continue home Prograf and prednisone -Avoid nephrotoxic meds.   Hypomagnesemia/hypokalemia-resolved.  Bacteriuria: Urine culture with Aerococcus species.  Low suspicion for UTI although patient is not a great historian.  She has no fever or leukocytosis.  Clinically improving without antibiotics. -No indication for treatment.  COVID-19 infection: No respiratory issues.  No indication for medications. -Continue isolation precaution for a total of 10 days   Uncontrolled DM-2 with hyperglycemia and hyperlipidemia: A1c 7.3%. Recent Labs  Lab 08/26/20 1223 08/26/20 1640 08/26/20 2100 08/27/20 0805 08/27/20 1048  GLUCAP 201* 91 172* 260* 214*  -Continue  SSI-moderate -Continue NovoLog 8 units 3 times a day -Increase Levemir from 6 to 10 units twice daily -Continue statin.  Severe dementia without behavioral disturbance: Stable.  Can tell her name at times.  Follows commands. -Reorientation and delirium precautions -Continue Seroquel and Namenda.  Hypothyroidism -Continue usual Synthroid dose   Depression -Continue usual Zoloft dose  Thrombocytopenia: Likely lab error or platelet clumping.  Resolved. Recent Labs  Lab 08/22/20 1106 08/22/20 1533 08/23/20 0554 08/24/20 0433 08/25/20 0336 08/26/20 0320  PLT 246 248 236 116* 258 248   Increased nutrient needs Body mass index is 27.1 kg/m. Nutrition Problem: Increased nutrient needs Etiology: acute illness Signs/Symptoms: estimated needs Interventions: Refer to RD note for recommendations  DVT prophylaxis:  heparin injection 5,000 Units Start: 08/22/20 2200 SCDs Start: 08/22/20 1533  Code Status: Full code Family Communication: Patient and/or RN. Available if any question.  Level of care: Telemetry Status is: Inpatient  Remains inpatient appropriate because:Unsafe d/c plan  Dis[facility]: [facility]              Anticipated d/c is to: ALF once she completes isolation precaution for COVID-19 on 8/21.              Patient currently is medically stable to d/c.   Difficult to place patient No       Consultants:  None   Sch Meds:  Scheduled Meds:  cholecalciferol  2,000 Units Oral Daily   feeding supplement (GLUCERNA SHAKE)  237 mL Oral TID BM   heparin  5,000 Units Subcutaneous Q8H   insulin aspart  0-15 Units Subcutaneous TID WC   insulin aspart  0-5 Units Subcutaneous QHS   insulin aspart  10 Units Subcutaneous TID WC   insulin detemir  8 Units Subcutaneous BID   levothyroxine  100 mcg Oral Q0600   memantine  10 mg Oral Q12H   multivitamin with minerals  1 tablet Oral Daily   predniSONE  5 mg Oral q morning   QUEtiapine  25 mg  Oral QHS   rosuvastatin  5 mg Oral QHS   tacrolimus  0.5 mg Oral Daily   Continuous Infusions: PRN Meds:.acetaminophen **OR** [DISCONTINUED] acetaminophen, alum & mag hydroxide-simeth, guaiFENesin, ondansetron **OR** ondansetron (ZOFRAN) IV  Antimicrobials: Anti-infectives (From admission, onward)    None        I have personally reviewed the following labs and images: CBC: Recent Labs  Lab 08/22/20 1106 08/22/20 1533 08/23/20 0554 08/24/20 0433 08/25/20 0336 08/26/20 0320  WBC 9.2 7.7 6.9 6.8 7.0 8.9  NEUTROABS 7.4  --   --   --   --   --   HGB 13.7 13.0 12.7 12.6 13.0 13.5  HCT 43.2 40.6 39.5 39.3 40.9 42.7  MCV 77.1* 77.2* 77.8* 76.9* 77.8* 77.6*  PLT 246 248 236 116* 258 248   BMP &GFR Recent Labs  Lab 08/22/20 1533 08/23/20 0554 08/24/20 0433 08/25/20 0336 08/26/20 0320 08/27/20 0943  NA  --  143 140 141 139 135  K  --  3.0* 3.6 4.5 4.2 5.0  CL  --  111 110 109 106 101  CO2  --  22 23  GLUCOSE  --  108* 82 197* 154* 282*  BUN  --  CREATININE 1.07* 0.87 0.62 0.57 0.64 0.85  CALCIUM  --  7.9* 8.0* 8.9 9.8 9.8  MG 0.9* 1.4* 1.1* 1.6* 1.6* 1.4*  PHOS 3.5  --  3.7 2.9 3.9 4.5   Estimated Creatinine Clearance: 54.5 mL/min (by C-G formula based on SCr of 0.85 mg/dL). Liver & Pancreas: Recent Labs  Lab 08/22/20 1106 08/24/20 0433 08/25/20 0336 08/26/20 0320 08/27/20 0943  AST 34 27  --   --   --   ALT 19 21  --   --   --   ALKPHOS 95 77  --   --   --   BILITOT 0.9 0.6  --   --   --   PROT 7.5 6.0*  --   --   --   ALBUMIN 3.7 2.9* 3.2* 3.3* 3.3*   No results for input(s): LIPASE, AMYLASE in the last 168 hours. No results for input(s): AMMONIA in the last 168 hours. Diabetic: No results for input(s): HGBA1C in the last 72 hours.  Recent Labs  Lab 08/26/20 1223 08/26/20 1640 08/26/20 2100 08/27/20 0805 08/27/20 1048  GLUCAP 201* 91 172* 260* 214*   Cardiac Enzymes: No results for input(s): CKTOTAL, CKMB, CKMBINDEX,  TROPONINI in the last 168 hours. No results for input(s): PROBNP in the last 8760 hours. Coagulation Profile: Recent Labs  Lab 08/22/20 1106  INR 1.0   Thyroid Function Tests: No results for input(s): TSH, T4TOTAL, FREET4, T3FREE, THYROIDAB in the last 72 hours.  Lipid Profile: No results for input(s): CHOL, HDL, LDLCALC, TRIG, CHOLHDL, LDLDIRECT in the last 72 hours. Anemia Panel: No results for input(s): VITAMINB12, FOLATE, FERRITIN, TIBC, IRON, RETICCTPCT in the last 72 hours. Urine analysis:    Component Value Date/Time   COLORURINE YELLOW 08/22/2020 1106   APPEARANCEUR HAZY (A) 08/22/2020 1106   LABSPEC 1.003 (L) 08/22/2020 1106   PHURINE 6.0 08/22/2020 1106   GLUCOSEU NEGATIVE 08/22/2020 1106   HGBUR SMALL (A) 08/22/2020 1106   BILIRUBINUR NEGATIVE 08/22/2020 1106   KETONESUR NEGATIVE 08/22/2020 1106   PROTEINUR NEGATIVE 08/22/2020 1106   UROBILINOGEN 1.0 08/14/2012 1022   NITRITE NEGATIVE 08/22/2020 1106   LEUKOCYTESUR NEGATIVE 08/22/2020 1106   Sepsis Labs: Invalid input(s): PROCALCITONIN, LACTICIDVEN  Microbiology: Recent Results (from the past 240 hour(s))  Urine Culture     Status: Abnormal   Collection Time: 08/22/20 11:06 AM   Specimen: In/Out Cath Urine  Result Value Ref Range Status   Specimen Description   Final    IN/OUT CATH URINE Performed at [facility], [address], [place], [place] [address]    Special Requests   Final    NONE Performed at [facility], [address], [place], [place] [address]    Culture (A)  Final    >=100,000 COLONIES/mL AEROCOCCUS SPECIES Standardized susceptibility testing for this organism is not available. Performed at [facility], [address] [place], [place] [address]    Report Status 08/24/2020 FINAL  Final  Blood Culture (routine x 2)     Status: None   Collection Time: 08/22/20 11:43 AM   Specimen: BLOOD  Result Value Ref Range Status   Specimen  Description   Final    BLOOD RIGHT ANTECUBITAL Performed at [facility], [address] [place], [place] [address]    Special Requests  Final    BOTTLES DRAWN AEROBIC AND ANAEROBIC Blood Culture adequate volume Performed at [facility], [address] [place], [place] [address]    Culture   Final    NO GROWTH 5 DAYS Performed at [facility], [address], [place], [place] [address]    Report Status 08/27/2020 FINAL  Final  Blood Culture (routine x 2)     Status: None   Collection Time: 08/22/20 11:50 AM   Specimen: BLOOD  Result Value Ref Range Status   Specimen Description   Final    BLOOD LEFT ANTECUBITAL Performed at [facility], [address] [place], [place] [address]    Special Requests   Final    BOTTLES DRAWN AEROBIC AND ANAEROBIC Blood Culture results may not be optimal due to an inadequate volume of blood received in culture bottles Performed at [facility], [address], [place], [place] [address]    Culture   Final    NO GROWTH 5 DAYS Performed at [facility], [address], [place], [place] [address]    Report Status 08/27/2020 FINAL  Final  Resp Panel by RT-PCR (Flu A&B, Covid) Nasopharyngeal Swab     Status: Abnormal   Collection Time: 08/22/20  4:29 PM   Specimen: Nasopharyngeal Swab; Nasopharyngeal(NP) swabs in vial transport medium  Result Value Ref Range Status   SARS Coronavirus 2 by RT PCR POSITIVE (A) NEGATIVE Final    Comment: RESULT CALLED TO, READ BACK BY AND VERIFIED WITH: L.DAVIDSON, RN AT 1943 ON 08.11.22 BY N.THOMPSON (NOTE) SARS-CoV-2 target nucleic acids are DETECTED.  The SARS-CoV-2 RNA is generally detectable in upper respiratory specimens during the acute phase of infection. Positive results are indicative of the presence of the identified virus, but do not rule out bacterial infection or co-infection with other pathogens not detected by  the test. Clinical correlation with patient history and other diagnostic information is necessary to determine patient infection status. The expected result is Negative.  Fact Sheet for Patients: BloggerCourse.com  Fact Sheet for Healthcare Providers: SeriousBroker.it  This test is not yet approved or cleared by the [place] FDA and  has been authorized for detection and/or diagnosis of SARS-CoV-2 by FDA under an Emergency Use Authorization (EUA).  This EUA will remain in effect (meaning t his test can be used) for the duration of  the COVID-19 declaration under Section 564(b)(1) of the Act, 21 U.S.C. section 360bbb-3(b)(1), unless the authorization is terminated or revoked sooner.     Influenza A by PCR NEGATIVE NEGATIVE Final   Influenza B by PCR NEGATIVE NEGATIVE Final    Comment: (NOTE) The Xpert Xpress SARS-CoV-2/FLU/RSV plus assay is intended as an aid in the diagnosis of influenza from Nasopharyngeal swab specimens and should not be used as a sole basis for treatment. Nasal washings and aspirates are unacceptable for Xpert Xpress SARS-CoV-2/FLU/RSV testing.  Fact Sheet for Patients: BloggerCourse.com  Fact Sheet for Healthcare Providers: SeriousBroker.it  This test is not yet approved or cleared by the [place] FDA and has been authorized for detection and/or diagnosis of SARS-CoV-2 by FDA under an Emergency Use Authorization (EUA). This EUA will remain in effect (meaning this test can be used) for the duration of the COVID-19 declaration under Section 564(b)(1) of the Act, 21 U.S.C. section 360bbb-3(b)(1), unless the authorization is terminated or revoked.  Performed at [facility], [address] [place], [place] [address]  Radiology Studies: No results found.     Raelan Burgoon T. Meygan Kyser [facility]  If 7PM-7AM, please contact  night-coverage [email or web address] 08/27/2020, 4:00 PM

## 2020-08-27 NOTE — Progress Notes (Signed)
Report given to Florentina Addison, Charity fundraiser. She will assume care of this patients.

## 2020-08-27 NOTE — Progress Notes (Signed)
Occupational Therapy Treatment Patient Details Name: Emma Maldonado MRN: [id number] DOB: Mar 31, 1950 Today's Date: 08/27/2020    History of present illness 70 y.o. female with medical history significant of hypertension, dementia, history of renal transplant on immunosuppressant followed by her nephrologist at [facility], brought by EMS from [facility] house for evaluation of low blood pressure.   OT comments  Patient is making progress towards goals. Patient required min A for thoughtless of hygiene tasks on this date. Patient's discharge plan remains appropriate at this time. OT will continue to follow acutely.     Follow Up Recommendations  No OT follow up    Equipment Recommendations  None recommended by OT    Recommendations for Other Services      Precautions / Restrictions Precautions Precautions: Fall Restrictions Weight Bearing Restrictions: No       Mobility Bed Mobility Overal bed mobility: Needs Assistance Bed Mobility: Supine to Sit     Supine to sit: Min guard;HOB elevated          Transfers Overall transfer level: Needs assistance Equipment used: Rolling walker (2 wheeled) Transfers: Sit to/from Stand Sit to Stand: Min guard Stand pivot transfers: Min guard            Balance Overall balance assessment: History of Falls                                         ADL either performed or assessed with clinical judgement   ADL Overall ADL's : Needs assistance/impaired                         Toilet Transfer: Development worker, community and Hygiene: Sit to/from stand;Minimal assistance Toileting - Clothing Manipulation Details (indicate cue type and reason): patient was noted to require min A to complete hygiene tasks after patient reported being finished with task.     Functional mobility during ADLs: Min guard;Rolling walker       Vision Patient Visual Report: No change  from baseline     Perception     Praxis      Cognition Arousal/Alertness: Awake/alert Behavior During Therapy: WFL for tasks assessed/performed Overall Cognitive Status: History of cognitive impairments - at baseline                                 General Comments: hx dementia, able to communicate with simple brief commands/sentences        Exercises     Shoulder Instructions       General Comments      Pertinent Vitals/ Pain       Pain Assessment: No/denies pain  Home Living                                          Prior Functioning/Environment              Frequency  Min 2X/week        Progress Toward Goals  OT Goals(current goals can now be found in the care plan section)  Progress towards OT goals: Progressing toward goals     Plan Discharge plan remains appropriate    Co-evaluation  AM-PAC OT "6 Clicks" Daily Activity     Outcome Measure   Help from another person eating meals?: A Little Help from another person taking care of personal grooming?: A Little Help from another person toileting, which includes using toliet, bedpan, or urinal?: A Little Help from another person bathing (including washing, rinsing, drying)?: A Little Help from another person to put on and taking off regular upper body clothing?: A Little Help from another person to put on and taking off regular lower body clothing?: A Little 6 Click Score: 18    End of Session Equipment Utilized During Treatment: Gait belt;Rolling walker  OT Visit Diagnosis: Unsteadiness on feet (R26.81)   Activity Tolerance Patient tolerated treatment well   Patient Left in bed;with call bell/phone within reach;with bed alarm set   Nurse Communication          Time: 4098-1191 OT Time Calculation (min): 17 min  Charges: OT General Charges $OT Visit: 1 Visit OT Treatments $Self Care/Home Management : 8-22 mins  Emma Maldonado OTR/L,  MS Acute Rehabilitation Department Office# [phone] Pager# [phone]    [place] Guest Emma Maldonado 08/27/2020, 3:46 PM

## 2020-08-28 LAB — RENAL FUNCTION PANEL
Albumin: 3.4 g/dL — ABNORMAL LOW (ref 3.5–5.0)
Anion gap: 10 (ref 5–15)
BUN: 23 mg/dL (ref 8–23)
CO2: 23 mmol/L (ref 22–32)
Calcium: 9.4 mg/dL (ref 8.9–10.3)
Chloride: 105 mmol/L (ref 98–111)
Creatinine, Ser: 0.88 mg/dL (ref 0.44–1.00)
GFR, Estimated: >60 mL/min (ref >60)
Glucose, Bld: 173 mg/dL — ABNORMAL HIGH (ref 70–99)
Phosphorus: 4.2 mg/dL (ref 2.5–4.6)
Potassium: 5.1 mmol/L (ref 3.5–5.1)
Sodium: 138 mmol/L (ref 135–145)

## 2020-08-28 LAB — GLUCOSE, CAPILLARY
Glucose-Capillary: 153 mg/dL — ABNORMAL HIGH (ref 70–99)
Glucose-Capillary: 270 mg/dL — ABNORMAL HIGH (ref 70–99)
Glucose-Capillary: 275 mg/dL — ABNORMAL HIGH (ref 70–99)
Glucose-Capillary: 216 mg/dL — ABNORMAL HIGH (ref 70–99)

## 2020-08-28 LAB — MAGNESIUM: Magnesium: 1.6 mg/dL — ABNORMAL LOW (ref 1.7–2.4)

## 2020-08-28 MED ORDER — LACTATED RINGERS IV BOLUS
250.0000 mL | Freq: Once | INTRAVENOUS | Status: AC
  Administered 2020-08-28: 250 mL via INTRAVENOUS

## 2020-08-28 MED ORDER — MAGNESIUM SULFATE 4 GM/100ML IV SOLN
4.0000 g | Freq: Once | INTRAVENOUS | Status: AC
  Administered 2020-08-28: 4 g via INTRAVENOUS
  Filled 2020-08-28: qty 100

## 2020-08-28 NOTE — Progress Notes (Signed)
Physical Therapy Treatment Patient Details Name: Emma Maldonado MRN: [id number] DOB: [birth year] Today's Date: 08/28/2020    History of Present Illness 70 y.o. female brought by EMS from [facility] house for evaluation of low blood pressure on 08/22/20. Admitted with hypotension and hypoglycemia.  Pt also found to be COVID +. Pt with medical history significant of hypertension, dementia, history of renal transplant on immunosuppressant followed by her nephrologist at [facility], .    PT Comments    Pt making good progress and able to ambulate 15'x4.  Pt did not appear to have fatigue with ambulation and VSS, but she took rest breaks when she had opportunity.  Required cues for safety and min guard to steady.     Follow Up Recommendations  No PT follow up;Supervision for mobility/OOB (return to memory care unit)     Equipment Recommendations  None recommended by PT    Recommendations for Other Services       Precautions / Restrictions Precautions Precautions: Fall    Mobility  Bed Mobility Overal bed mobility: Needs Assistance Bed Mobility: Supine to Sit;Sit to Supine     Supine to sit: Supervision Sit to supine: Supervision   General bed mobility comments: cues to wait for line management    Transfers Overall transfer level: Needs assistance Equipment used: Rolling walker (2 wheeled) Transfers: Sit to/from Stand Sit to Stand: Min guard         General transfer comment: Performed x 6 during treatmetn with min guard to steady  Ambulation/Gait Ambulation/Gait assistance: Min guard Gait Distance (Feet): 15 Feet (15'x4) Assistive device: Rolling walker (2 wheeled) Gait Pattern/deviations: Step-through pattern;Decreased stride length Gait velocity: decreased   General Gait Details: Pt ambulated 15'x4. Cues for safety with RW. She did not appear fatigued but pt would take seated rest breaks at each opportunity despite encouragement to try another lap.  She would  rest a few seconds and then walk again.  Possibly limited due to dementia.  Pt with no DOE, HR stable, and O2 sats 99% on RA.   Stairs             Wheelchair Mobility    Modified Rankin (Stroke Patients Only)       Balance Overall balance assessment: History of Falls;Needs assistance Sitting-balance support: No upper extremity supported Sitting balance-Leahy Scale: Good     Standing balance support: Bilateral upper extremity supported;No upper extremity supported Standing balance-Leahy Scale: Fair Standing balance comment: RW to ambulate but could stand for toileting ADLs and washing hands                            Cognition Arousal/Alertness: Awake/alert Behavior During Therapy: WFL for tasks assessed/performed Overall Cognitive Status: History of cognitive impairments - at baseline                                 General Comments: hx dementia, able to communicate with simple brief commands/sentences      Exercises      General Comments General comments (skin integrity, edema, etc.): VSS      Pertinent Vitals/Pain Pain Assessment: No/denies pain    Home Living                      Prior Function            PT Goals (current goals can now  be found in the care plan section) Progress towards PT goals: Progressing toward goals    Frequency    Min 2X/week      PT Plan Current plan remains appropriate    Co-evaluation              AM-PAC PT "6 Clicks" Mobility   Outcome Measure  Help needed turning from your back to your side while in a flat bed without using bedrails?: None Help needed moving from lying on your back to sitting on the side of a flat bed without using bedrails?: A Little Help needed moving to and from a bed to a chair (including a wheelchair)?: A Little Help needed standing up from a chair using your arms (e.g., wheelchair or bedside chair)?: A Little Help needed to walk in hospital room?:  A Little Help needed climbing 3-5 steps with a railing? : A Little 6 Click Score: 19    End of Session Equipment Utilized During Treatment: Gait belt Activity Tolerance: Patient tolerated treatment well Patient left: with call bell/phone within reach;in bed;with bed alarm set (encouraged chair but pt wanting back to bed) Nurse Communication: Mobility status PT Visit Diagnosis: Difficulty in walking, not elsewhere classified (R26.2)     Time: 2330-0762 PT Time Calculation (min) (ACUTE ONLY): 17 min  Charges:  $Gait Training: 8-22 mins                     [name], PT Acute Rehab Services Pager [phone] [name] [phone]    [name] 08/28/2020, 2:48 PM

## 2020-08-28 NOTE — TOC Progression Note (Addendum)
Transition of Care [facility]) - Progression Note    Patient Details  Name: Emma Maldonado MRN: [id number] Date of Birth: 1950/12/20  Transition of Care [facility]) CM/SW Contact  Darleene Cleaver, [place] Phone Number: 08/28/2020, 1:05 PM  Clinical Narrative:     CSW received phone message to call patient's daughter Gardiner Ramus back.  CSW attempted to call but was unable to leave a voice mail will try again later.  Patient from [facility] ALF, patient can not return until August 22nd as long as she is medically ready for discharge.        Expected Discharge Plan and Services    Back to [facility] memory care ALF.                                             Social Determinants of Health (SDOH) Interventions    Readmission Risk Interventions No flowsheet data found.

## 2020-08-28 NOTE — Progress Notes (Signed)
PROGRESS NOTE    Emma Maldonado  PYK:[id number] DOB: 1950/10/05 DOA: 08/22/2020 PCP: Patient, No Pcp Per (Inactive)    Chief Complaint  Patient presents with   Shortness of Breath    Brief Narrative:  70 year old ALF resident with a history of HTN, dementia, IDDM-2, hypothyroidism, adrenal insufficiency on Florinef, and renal transplant on chronic immunosuppressants who was transported to the ER from her facility after she was found to have "low blood pressures."  EMS noted pressures of 60/40 on arrival as well as a blood sugar of 51.  There was no reported loss of consciousness,fevers, chills or falls.  She was admitted for hypotension, hypoglycemia and AKI.  Also tested positive for COVID-19 but did not require treatment.  Still with significant electrolyte derangement including hypomagnesemia and hypokalemia.  Otherwise, she is a stable for discharge to ALF once she completes isolation precaution on 8/21.     Assessment & Plan:   Principal Problem:   Hypotension Active Problems:   Diabetes mellitus (HCC)   Hyperlipidemia   Essential hypertension   Dementia (HCC)   Acute renal failure (HCC)   Hypomagnesemia   #1 hypotension -Patient presented with hypotension history of autonomic instability and adrenal insufficiency on Florinef prior to admission. -Urine cultures with Aerococcus species likely bacteriuria. -Patient remained afebrile, no leukocytosis. -Hypotension improved with hydration. -Florinef resumed. -Continue to hold losartan.  2.  Hypertension -Patient on presentation noted to be hypotensive which has improved. -Florinef resumed. -We will likely avoid losartan if patient truly has a mineralocorticoid deficiency and likely will not resume on discharge.  3.  Acute kidney injury of transplanted kidney -Resolved. -Continue Prograf, prednisone. -Avoid nephrotoxic agents -Outpatient follow-up with primary nephrologist.  4.  hypomagnesemia/hypokalemia -Potassium at 5.1.  Magnesium at 1.6. -Magnesium sulfate 4 g IV x1. -Repeat labs in the morning.  5.  COVID-19 infection -Asymptomatic, not hypoxic. -No indication for treatment at this time. -On chronic steroids. -Continue isolation precautions for total of 10 days.  6.  Type 2 diabetes mellitus with hyperglycemia and hyperlipidemia -Hemoglobin A1c 7.3 (08/22/2020). -CBG 153 this morning. -Patient on chronic steroids. -Continue current regimen of Levemir 8 units twice daily -Patient to ED.  50% of meals today as such we will discontinue coverage NovoLog. -SSI.  7.  Hypothyroidism -Continue Synthroid.   8.  Severe dementia without behavioral disturbance -Stable. -Oriented to self and place. - following commands. -Delirium precautions, reorientation. -Continue Namenda, Seroquel.  9.  Depression -Zoloft.  10.  Thrombocytopenia -Felt likely secondary to lab error or platelet clumping.  Resolved.  11.  Hyperlipidemia -Statin.   DVT prophylaxis: Heparin Code Status: Full Family Communication: Updated patient.  No family at bedside. Disposition:   Status is: Inpatient  Remains inpatient appropriate because:Unsafe d/c plan  Dispo: The patient is from: ALF              Anticipated d/c is to: ALF once patient completes isolation precautions for COVID-19 on 09/01/2020              Patient currently is medically stable to d/c.   Difficult to place patient No       Consultants:  None  Procedures:  Chest x-ray 08/22/2020 Renal ultrasound 08/22/2020   Antimicrobials: None  Subjective: Sleeping but arousable.  Denies any chest pain, no shortness of breath, no abdominal pain.  Overall feeling better.  Alert to self.  Objective: Vitals:   08/28/20 0548 08/28/20 0740 08/28/20 1422 08/28/20 2000  BP: (!) 84/53 112/60 128/64  120/60  Pulse: 83 72 78 86  Resp: 17  18 20   Temp: 98.2 F (36.8 C)  98.1 F (36.7 C) 98 F (36.7 C)  TempSrc:  Oral  Oral Oral  SpO2: 94%  98% 95%  Weight:      Height:        Intake/Output Summary (Last 24 hours) at 08/28/2020 2238 Last data filed at 08/28/2020 0550 Gross per 24 hour  Intake 240 ml  Output --  Net 240 ml   Filed Weights   08/23/20 0532 08/24/20 0500 08/28/20 0500  Weight: 67.4 kg 67.2 kg 67.1 kg    Examination:  General exam: Appears calm and comfortable  Respiratory system: Clear to auscultation. Respiratory effort normal. Cardiovascular system: S1 & S2 heard, RRR. No JVD, murmurs, rubs, gallops or clicks. No pedal edema. Gastrointestinal system: Abdomen is nondistended, soft and nontender. No organomegaly or masses felt. Normal bowel sounds heard. Central nervous system: Alert. No focal neurological deficits. Extremities: Symmetric 5 x 5 power. Skin: No rashes, lesions or ulcers Psychiatry: Judgement and insight appear poor to fair. Mood & affect appropriate.     Data Reviewed: I have personally reviewed following labs and imaging studies  CBC: Recent Labs  Lab 08/22/20 1106 08/22/20 1533 08/23/20 0554 08/24/20 0433 08/25/20 0336 08/26/20 0320  WBC 9.2 7.7 6.9 6.8 7.0 8.9  NEUTROABS 7.4  --   --   --   --   --   HGB 13.7 13.0 12.7 12.6 13.0 13.5  HCT 43.2 40.6 39.5 39.3 40.9 42.7  MCV 77.1* 77.2* 77.8* 76.9* 77.8* 77.6*  PLT 246 248 236 116* 258 248    Basic Metabolic Panel: Recent Labs  Lab 08/24/20 0433 08/25/20 0336 08/26/20 0320 08/27/20 0943 08/28/20 0848  NA 140 141 139 135 138  K 3.6 4.5 4.2 5.0 5.1  CL 110 109 106 101 105  CO2 23 26 25 25 23   GLUCOSE 82 197* 154* 282* 173*  BUN 12 11 12 21 23   CREATININE 0.62 0.57 0.64 0.85 0.88  CALCIUM 8.0* 8.9 9.8 9.8 9.4  MG 1.1* 1.6* 1.6* 1.4* 1.6*  PHOS 3.7 2.9 3.9 4.5 4.2    GFR: Estimated Creatinine Clearance: 52.7 mL/min (by C-G formula based on SCr of 0.88 mg/dL).  Liver Function Tests: Recent Labs  Lab 08/22/20 1106 08/24/20 0433 08/25/20 0336 08/26/20 0320 08/27/20 0943  08/28/20 0848  AST 34 27  --   --   --   --   ALT 19 21  --   --   --   --   ALKPHOS 95 77  --   --   --   --   BILITOT 0.9 0.6  --   --   --   --   PROT 7.5 6.0*  --   --   --   --   ALBUMIN 3.7 2.9* 3.2* 3.3* 3.3* 3.4*    CBG: Recent Labs  Lab 08/27/20 2114 08/28/20 0734 08/28/20 1203 08/28/20 1619 08/28/20 2021  GLUCAP 93 153* 270* 275* 216*     Recent Results (from the past 240 hour(s))  Urine Culture     Status: Abnormal   Collection Time: 08/22/20 11:06 AM   Specimen: In/Out Cath Urine  Result Value Ref Range Status   Specimen Description   Final    IN/OUT CATH URINE Performed at [facility], [address], [place], [place]    Special Requests   Final  NONE Performed at [facility], [address] [place], [place] [address]    Culture (A)  Final    >=100,000 COLONIES/mL AEROCOCCUS SPECIES Standardized susceptibility testing for this organism is not available. Performed at [facility], [address] [place], [place] [address]    Report Status 08/24/2020 FINAL  Final  Blood Culture (routine x 2)     Status: None   Collection Time: 08/22/20 11:43 AM   Specimen: BLOOD  Result Value Ref Range Status   Specimen Description   Final    BLOOD RIGHT ANTECUBITAL Performed at [facility], [address], [place], [place] [address]    Special Requests   Final    BOTTLES DRAWN AEROBIC AND ANAEROBIC Blood Culture adequate volume Performed at [facility], [address], [place], [place] [address]    Culture   Final    NO GROWTH 5 DAYS Performed at [facility], [address] [place], [place] [address]    Report Status 08/27/2020 FINAL  Final  Blood Culture (routine x 2)     Status: None   Collection Time: 08/22/20 11:50 AM   Specimen: BLOOD  Result Value Ref Range Status   Specimen Description   Final    BLOOD LEFT ANTECUBITAL Performed at [facility], [address], [place], [place] [address]    Special Requests   Final    BOTTLES DRAWN AEROBIC AND ANAEROBIC Blood Culture results may not be optimal due to an inadequate volume of blood received in culture bottles Performed at [facility], [address], [place], [place] [address]    Culture   Final    NO GROWTH 5 DAYS Performed at [facility], [address] [place], [place] [address]    Report Status 08/27/2020 FINAL  Final  Resp Panel by RT-PCR (Flu A&B, Covid) Nasopharyngeal Swab     Status: Abnormal   Collection Time: 08/22/20  4:29 PM   Specimen: Nasopharyngeal Swab; Nasopharyngeal(NP) swabs in vial transport medium  Result Value Ref Range Status   SARS Coronavirus 2 by RT PCR POSITIVE (A) NEGATIVE Final    Comment: RESULT CALLED TO, READ BACK BY AND VERIFIED WITH: L.DAVIDSON, RN AT 1943 ON 08.11.22 BY N.Alrick Cubbage (NOTE) SARS-CoV-2 target nucleic acids are DETECTED.  The SARS-CoV-2 RNA is generally detectable in upper respiratory specimens during the acute phase of infection. Positive results are indicative of the presence of the identified virus, but do not rule out bacterial infection or co-infection with other pathogens not detected by the test. Clinical correlation with patient history and other diagnostic information is necessary to determine patient infection status. The expected result is Negative.  Fact Sheet for Patients: BloggerCourse.com  Fact Sheet for Healthcare Providers: SeriousBroker.it  This test is not yet approved or cleared by the [place] FDA and  has been authorized for detection and/or diagnosis of SARS-CoV-2 by FDA under an Emergency Use Authorization (EUA).  This EUA will remain in effect (meaning t his test can be used) for the duration of  the COVID-19 declaration under Section 564(b)(1) of the Act, 21 U.S.C. section 360bbb-3(b)(1), unless  the authorization is terminated or revoked sooner.     Influenza A by PCR NEGATIVE NEGATIVE Final   Influenza B by PCR NEGATIVE NEGATIVE Final    Comment: (NOTE) The Xpert Xpress SARS-CoV-2/FLU/RSV plus assay is intended as an aid in the diagnosis of influenza from Nasopharyngeal swab  specimens and should not be used as a sole basis for treatment. Nasal washings and aspirates are unacceptable for Xpert Xpress SARS-CoV-2/FLU/RSV testing.  Fact Sheet for Patients: BloggerCourse.comhttps://www.fda.gov/media/152166/download  Fact Sheet for Healthcare Providers: SeriousBroker.ithttps://www.fda.gov/media/152162/download  This test is not yet approved or cleared by the [place] FDA and has been authorized for detection and/or diagnosis of SARS-CoV-2 by FDA under an Emergency Use Authorization (EUA). This EUA will remain in effect (meaning this test can be used) for the duration of the COVID-19 declaration under Section 564(b)(1) of the Act, 21 U.S.C. section 360bbb-3(b)(1), unless the authorization is terminated or revoked.  Performed at [facility], [address] [place], [place] [id number]          Radiology Studies: No results found.      Scheduled Meds:  cholecalciferol  2,000 Units Oral Daily   feeding supplement (GLUCERNA SHAKE)  237 mL Oral TID BM   fludrocortisone  0.1 mg Oral Daily   heparin  5,000 Units Subcutaneous Q8H   insulin aspart  0-15 Units Subcutaneous TID WC   insulin aspart  0-5 Units Subcutaneous QHS   insulin aspart  10 Units Subcutaneous TID WC   insulin detemir  8 Units Subcutaneous BID   levothyroxine  100 mcg Oral Q0600   memantine  10 mg Oral Q12H   multivitamin with minerals  1 tablet Oral Daily   predniSONE  5 mg Oral q morning   QUEtiapine  25 mg Oral QHS   rosuvastatin  5 mg Oral QHS   tacrolimus  0.5 mg Oral Daily   Continuous Infusions:   LOS: 5 days    Time spent: 35 minutes    Ramiro Harvestaniel Samrat Hayward, MD [facility]   To  contact the attending provider between 7A-7P or the covering provider during after hours 7P-7A, please log into the web site [email or web address] and access using universal [place] password for that web site. If you do not have the password, please call the hospital operator.  08/28/2020, 10:38 PM

## 2020-08-29 LAB — BASIC METABOLIC PANEL
Anion gap: 8 (ref 5–15)
BUN: 22 mg/dL (ref 8–23)
CO2: 26 mmol/L (ref 22–32)
Calcium: 9.8 mg/dL (ref 8.9–10.3)
Chloride: 105 mmol/L (ref 98–111)
Creatinine, Ser: 0.69 mg/dL (ref 0.44–1.00)
GFR, Estimated: >60 mL/min (ref >60)
Glucose, Bld: 156 mg/dL — ABNORMAL HIGH (ref 70–99)
Potassium: 4.4 mmol/L (ref 3.5–5.1)
Sodium: 139 mmol/L (ref 135–145)

## 2020-08-29 LAB — GLUCOSE, CAPILLARY
Glucose-Capillary: 155 mg/dL — ABNORMAL HIGH (ref 70–99)
Glucose-Capillary: 263 mg/dL — ABNORMAL HIGH (ref 70–99)
Glucose-Capillary: 212 mg/dL — ABNORMAL HIGH (ref 70–99)
Glucose-Capillary: 131 mg/dL — ABNORMAL HIGH (ref 70–99)

## 2020-08-29 LAB — MAGNESIUM: Magnesium: 2.5 mg/dL — ABNORMAL HIGH (ref 1.7–2.4)

## 2020-08-29 MED ORDER — PROSOURCE PLUS PO LIQD
30.0000 mL | Freq: Two times a day (BID) | ORAL | Status: DC
  Administered 2020-08-30 – 2020-09-02 (×6): 30 mL via ORAL
  Filled 2020-08-29 (×6): qty 30

## 2020-08-29 MED ORDER — ENSURE SURGERY PO LIQD
237.0000 mL | ORAL | Status: DC
  Administered 2020-09-01: 237 mL via ORAL
  Filled 2020-08-29 (×3): qty 237

## 2020-08-29 MED ORDER — GLUCERNA SHAKE PO LIQD
237.0000 mL | Freq: Two times a day (BID) | ORAL | Status: DC
  Administered 2020-08-29 – 2020-09-02 (×7): 237 mL via ORAL
  Filled 2020-08-29 (×9): qty 237

## 2020-08-29 NOTE — Progress Notes (Signed)
Nutrition Follow-up RD working remotely.  DOCUMENTATION CODES:   Not applicable  INTERVENTION:  - will decrease Glucerna Shake from TID to BID, each supplement provides 220 kcal and 10 grams of protein. - will order Ensure Surgery once/day, each supplement provides 330 kcal and 18 grams of protein - will order 30 ml Prosource Plus BID, each supplement provides 100 kcal and 15 grams protein.    NUTRITION DIAGNOSIS:   Increased nutrient needs related to acute illness as evidenced by estimated needs. -ongoing  GOAL:   Patient will meet greater than or equal to 90% of their needs -unmet  MONITOR:   PO intake, Supplement acceptance, Labs, Weight trends  ASSESSMENT:   70 year old female presented to ED from SNF with hypotension. History of HTN, dementia, DM type 2, hypothyroidism, and renal transplant on chronic immunosuppressants.  Very limited meal intakes documented during hospitalization: 50% of dinner on 8/16 and 20% of breakfast this AM. She is ordered Glucerna Shake TID and has been accepting them 100% of the time offered.   She is now a/o x3.   Weight has been fairly stable throughout hospitalization. No information documented in the edema section of flow sheet. She is noted to be +3.3 L since admission.   Per notes: - hypotension--improved - AKI in transplanted kidney--resolved - COVID-19 infection--asymptomatic - severe dementia without behavioral disturbances - from ALF and plan to return to ALF once she is off of COVID precautions on 8/21   Labs reviewed; CBGs: 155 and 263 mg/dl, Mg: 2.5 mg/dl. Medications reviewed; 2000 units cholecalciferol/day, sliding scale novolog, 8 units levemir BID, 100 mcg oral synthroid/day, 4 g IV Mg sulfate x1 dose 8/17, 1 tablet multivitamin with minerals/day, 5 mg deltasone/day.   Diet Order:   Diet Order             Diet Carb Modified           Diet Carb Modified Fluid consistency: Thin; Room service appropriate? No  Diet  effective now                   EDUCATION NEEDS:   No education needs have been identified at this time  Skin:  Skin Assessment: Reviewed RN Assessment  Last BM:  8/17 (type 6 x1)  Height:   Ht Readings from Last 1 Encounters:  08/22/20 5\' 2"  (1.575 m)    Weight:   Wt Readings from Last 1 Encounters:  08/29/20 67.1 kg      Estimated Nutritional Needs:  Kcal:  1500-1700 kcal/d Protein:  75-85g/d Fluid:  1.6-1.8L/d      08/31/20, MS, RD, LDN, CNSC Inpatient Clinical Dietitian RD pager # available in [facility]  After hours/weekend pager # available in [facility]

## 2020-08-29 NOTE — Progress Notes (Signed)
PROGRESS NOTE    Emma Maldonado  WFU:[id number] DOB: 08-Mar-1950 DOA: 08/22/2020 PCP: Patient, No Pcp Per (Inactive)    Chief Complaint  Patient presents with   Shortness of Breath    Brief Narrative:  70 year old ALF resident with a history of HTN, dementia, IDDM-2, hypothyroidism, adrenal insufficiency on Florinef, and renal transplant on chronic immunosuppressants who was transported to the ER from her facility after she was found to have "low blood pressures."  EMS noted pressures of 60/40 on arrival as well as a blood sugar of 51.  There was no reported loss of consciousness,fevers, chills or falls.  She was admitted for hypotension, hypoglycemia and AKI.  Also tested positive for COVID-19 but did not require treatment.  Still with significant electrolyte derangement including hypomagnesemia and hypokalemia.  Otherwise, she is a stable for discharge to ALF once she completes isolation precaution on 8/21.     Assessment & Plan:   Principal Problem:   Hypotension Active Problems:   Diabetes mellitus (HCC)   Hyperlipidemia   Essential hypertension   Dementia (HCC)   Acute renal failure (HCC)   Hypomagnesemia   #1 hypotension -Patient presented with hypotension history of autonomic instability and adrenal insufficiency on Florinef prior to admission. -Urine cultures with Aerococcus species likely bacteriuria. -Currently afebrile, no leukocytosis.   -Hypotension improved with hydration and Florinef.   -Losartan held and likely will not resume on discharge.   2.  Hypertension -Patient on presentation noted to be hypotensive which has improved. -Continue Florinef. -We will likely avoid losartan if patient truly has a mineralocorticoid deficiency and likely will not resume on discharge.  3.  Acute kidney injury of transplanted kidney -Resolved. -Continue Prograf, prednisone. -Avoid nephrotoxic agents -Outpatient follow-up with primary nephrologist.  4.  hypomagnesemia/hypokalemia -Potassium at 4.4, magnesium at 2.5.   -Follow.  5.  COVID-19 infection -Asymptomatic, not hypoxic. -No indication for treatment at this time. -On chronic steroids. -Continue isolation precautions for total of 10 days.  6.  Type 2 diabetes mellitus with hyperglycemia and hyperlipidemia -Hemoglobin A1c 7.3 (08/22/2020). -CBG 155 this morning.   -On chronic steroids.   -New coverage NovoLog discontinued as patient fluctuating with amount of food she eats.   -Continue current regimen of Levemir 8 units twice daily, SSI.   -Follow.  7.  Hypothyroidism -Synthroid.  8.  Severe dementia without behavioral disturbance -Patient oriented to self and place. -Following commands appropriately. -Continue Seroquel, Namenda. -Frequent reorientation, delirium precautions. -Follow.  9.  Depression -Continue Zoloft.  10.  Thrombocytopenia -Felt likely secondary to lab error or platelet clumping.  Resolved.  11.  Hyperlipidemia -Continue statin.   DVT prophylaxis: Heparin Code Status: Full Family Communication: Updated patient.  No family at bedside. Disposition:   Status is: Inpatient  Remains inpatient appropriate because:Unsafe d/c plan  Dispo: The patient is from: ALF              Anticipated d/c is to: ALF once patient completes isolation precautions for COVID-19 on 09/01/2020              Patient currently is medically stable to d/c.   Difficult to place patient No       Consultants:  None  Procedures:  Chest x-ray 08/22/2020 Renal ultrasound 08/22/2020   Antimicrobials: None  Subjective: Asleep however easily arousable.  Denies chest pain, no shortness of breath, no abdominal pain.  Feeling better.  Thankful for her care.    Objective: Vitals:   08/28/20 1422 08/28/20 2000  08/29/20 0500 08/29/20 0511  BP: 128/64 120/60  (!) 122/53  Pulse: 78 86  66  Resp: 18 20  16   Temp: 98.1 F (36.7 C) 98 F (36.7 C)  97.8 F (36.6 C)   TempSrc: Oral Oral  Oral  SpO2: 98% 95%  92%  Weight:   67.1 kg   Height:       No intake or output data in the 24 hours ending 08/29/20 1056  Filed Weights   08/24/20 0500 08/28/20 0500 08/29/20 0500  Weight: 67.2 kg 67.1 kg 67.1 kg    Examination:  General exam: : NAD Respiratory system: CTA B.  No wheezes, no rhonchi.  Speaking in full sentences.  Normal respiratory effort. Cardiovascular system: Regular rate and rhythm no murmurs rubs or gallops.  No JVD.  No lower extremity edema.  Gastrointestinal system: Abdomen soft, nontender, nondistended, positive bowel sounds.  No rebound.  No guarding. Central nervous system: Alert. No focal neurological deficits. Extremities: Symmetric 5 x 5 power. Skin: No rashes, lesions or ulcers Psychiatry: Judgement and insight appear poor. Mood & affect appropriate.   Data Reviewed: I have personally reviewed following labs and imaging studies  CBC: Recent Labs  Lab 08/22/20 1106 08/22/20 1533 08/23/20 0554 08/24/20 0433 08/25/20 0336 08/26/20 0320  WBC 9.2 7.7 6.9 6.8 7.0 8.9  NEUTROABS 7.4  --   --   --   --   --   HGB 13.7 13.0 12.7 12.6 13.0 13.5  HCT 43.2 40.6 39.5 39.3 40.9 42.7  MCV 77.1* 77.2* 77.8* 76.9* 77.8* 77.6*  PLT 246 248 236 116* 258 248     Basic Metabolic Panel: Recent Labs  Lab 08/24/20 0433 08/25/20 0336 08/26/20 0320 08/27/20 0943 08/28/20 0848 08/29/20 0420  NA 140 141 139 135 138 139  K 3.6 4.5 4.2 5.0 5.1 4.4  CL 110 109 106 101 105 105  CO2 23 26 25 25 23 26   GLUCOSE 82 197* 154* 282* 173* 156*  BUN 12 11 12 21 23 22   CREATININE 0.62 0.57 0.64 0.85 0.88 0.69  CALCIUM 8.0* 8.9 9.8 9.8 9.4 9.8  MG 1.1* 1.6* 1.6* 1.4* 1.6* 2.5*  PHOS 3.7 2.9 3.9 4.5 4.2  --      GFR: Estimated Creatinine Clearance: 57.9 mL/min (by C-G formula based on SCr of 0.69 mg/dL).  Liver Function Tests: Recent Labs  Lab 08/22/20 1106 08/24/20 0433 08/25/20 0336 08/26/20 0320 08/27/20 0943 08/28/20 0848   AST 34 27  --   --   --   --   ALT 19 21  --   --   --   --   ALKPHOS 95 77  --   --   --   --   BILITOT 0.9 0.6  --   --   --   --   PROT 7.5 6.0*  --   --   --   --   ALBUMIN 3.7 2.9* 3.2* 3.3* 3.3* 3.4*     CBG: Recent Labs  Lab 08/28/20 0734 08/28/20 1203 08/28/20 1619 08/28/20 2021 08/29/20 0749  GLUCAP 153* 270* 275* 216* 155*      Recent Results (from the past 240 hour(s))  Urine Culture     Status: Abnormal   Collection Time: 08/22/20 11:06 AM   Specimen: In/Out Cath Urine  Result Value Ref Range Status   Specimen Description   Final    IN/OUT CATH URINE Performed at [facility], [address]  [place], [place] [address]    Special Requests   Final    NONE Performed at [facility], [address], [place], [place] [address]    Culture (A)  Final    >=100,000 COLONIES/mL AEROCOCCUS SPECIES Standardized susceptibility testing for this organism is not available. Performed at [facility], [address], [place], [place] [address]    Report Status 08/24/2020 FINAL  Final  Blood Culture (routine x 2)     Status: None   Collection Time: 08/22/20 11:43 AM   Specimen: BLOOD  Result Value Ref Range Status   Specimen Description   Final    BLOOD RIGHT ANTECUBITAL Performed at [facility], [address] [place], [place] [address]    Special Requests   Final    BOTTLES DRAWN AEROBIC AND ANAEROBIC Blood Culture adequate volume Performed at [facility], [address] [place], [place] [address]    Culture   Final    NO GROWTH 5 DAYS Performed at [facility], [address], [place], [place] [address]    Report Status 08/27/2020 FINAL  Final  Blood Culture (routine x 2)     Status: None   Collection Time: 08/22/20 11:50 AM   Specimen: BLOOD  Result Value Ref Range Status   Specimen Description   Final    BLOOD LEFT ANTECUBITAL Performed at [facility], [address] [place], [place] [address]    Special Requests   Final    BOTTLES DRAWN AEROBIC AND ANAEROBIC Blood Culture results may not be optimal due to an inadequate volume of blood received in culture bottles Performed at [facility], [address], [place], [place] [address]    Culture   Final    NO GROWTH 5 DAYS Performed at [facility], [address] [place], [place] [address]    Report Status 08/27/2020 FINAL  Final  Resp Panel by RT-PCR (Flu A&B, Covid) Nasopharyngeal Swab     Status: Abnormal   Collection Time: 08/22/20  4:29 PM   Specimen: Nasopharyngeal Swab; Nasopharyngeal(NP) swabs in vial transport medium  Result Value Ref Range Status   SARS Coronavirus 2 by RT PCR POSITIVE (A) NEGATIVE Final    Comment: RESULT CALLED TO, READ BACK BY AND VERIFIED WITH: L.DAVIDSON, RN AT 1943 ON 08.11.22 BY N.Yvonnie Schinke (NOTE) SARS-CoV-2 target nucleic acids are DETECTED.  The SARS-CoV-2 RNA is generally detectable in upper respiratory specimens during the acute phase of infection. Positive results are indicative of the presence of the identified virus, but do not rule out bacterial infection or co-infection with other pathogens not detected by the test. Clinical correlation with patient history and other diagnostic information is necessary to determine patient infection status. The expected result is Negative.  Fact Sheet for Patients: BloggerCourse.com  Fact Sheet for Healthcare Providers: SeriousBroker.it  This test is not yet approved or cleared by the [place] FDA and  has been authorized for detection and/or diagnosis of SARS-CoV-2 by FDA under an Emergency Use Authorization (EUA).  This EUA will remain in effect (meaning t his test can be used) for the duration of  the COVID-19 declaration under Section 564(b)(1) of the Act, 21 U.S.C. section 360bbb-3(b)(1), unless the  authorization is terminated or revoked sooner.     Influenza A by PCR NEGATIVE NEGATIVE Final   Influenza B by PCR NEGATIVE NEGATIVE Final    Comment: (NOTE) The Xpert Xpress SARS-CoV-2/FLU/RSV plus  assay is intended as an aid in the diagnosis of influenza from Nasopharyngeal swab specimens and should not be used as a sole basis for treatment. Nasal washings and aspirates are unacceptable for Xpert Xpress SARS-CoV-2/FLU/RSV testing.  Fact Sheet for Patients: BloggerCourse.com  Fact Sheet for Healthcare Providers: SeriousBroker.it  This test is not yet approved or cleared by the [place] FDA and has been authorized for detection and/or diagnosis of SARS-CoV-2 by FDA under an Emergency Use Authorization (EUA). This EUA will remain in effect (meaning this test can be used) for the duration of the COVID-19 declaration under Section 564(b)(1) of the Act, 21 U.S.C. section 360bbb-3(b)(1), unless the authorization is terminated or revoked.  Performed at [facility], [address] [place], [place] [address]           Radiology Studies: No results found.      Scheduled Meds:  cholecalciferol  2,000 Units Oral Daily   feeding supplement (GLUCERNA SHAKE)  237 mL Oral TID BM   fludrocortisone  0.1 mg Oral Daily   heparin  5,000 Units Subcutaneous Q8H   insulin aspart  0-15 Units Subcutaneous TID WC   insulin aspart  0-5 Units Subcutaneous QHS   insulin detemir  8 Units Subcutaneous BID   levothyroxine  100 mcg Oral Q0600   memantine  10 mg Oral Q12H   multivitamin with minerals  1 tablet Oral Daily   predniSONE  5 mg Oral q morning   QUEtiapine  25 mg Oral QHS   rosuvastatin  5 mg Oral QHS   tacrolimus  0.5 mg Oral Daily   Continuous Infusions:   LOS: 6 days    Time spent: 35 minutes    Ramiro Harvest, MD [facility]   To contact the attending provider between 7A-7P or the  covering provider during after hours 7P-7A, please log into the web site [email or web address] and access using universal [place] password for that web site. If you do not have the password, please call the hospital operator.  08/29/2020, 10:56 AM

## 2020-08-30 LAB — GLUCOSE, CAPILLARY
Glucose-Capillary: 181 mg/dL — ABNORMAL HIGH (ref 70–99)
Glucose-Capillary: 267 mg/dL — ABNORMAL HIGH (ref 70–99)
Glucose-Capillary: 311 mg/dL — ABNORMAL HIGH (ref 70–99)
Glucose-Capillary: 324 mg/dL — ABNORMAL HIGH (ref 70–99)

## 2020-08-30 NOTE — Progress Notes (Signed)
PROGRESS NOTE    Emma Maldonado  XVQ:[id number] DOB: May 31, 1950 DOA: 08/22/2020 PCP: Patient, No Pcp Per (Inactive)    Chief Complaint  Patient presents with   Shortness of Breath    Brief Narrative:  70 year old ALF resident with a history of HTN, dementia, IDDM-2, hypothyroidism, adrenal insufficiency on Florinef, and renal transplant on chronic immunosuppressants who was transported to the ER from her facility after she was found to have "low blood pressures."  EMS noted pressures of 60/40 on arrival as well as a blood sugar of 51.  There was no reported loss of consciousness,fevers, chills or falls.  She was admitted for hypotension, hypoglycemia and AKI.  Also tested positive for COVID-19 but did not require treatment.  Still with significant electrolyte derangement including hypomagnesemia and hypokalemia.  Otherwise, she is a stable for discharge to ALF once she completes isolation precaution on 8/21.     Assessment & Plan:   Principal Problem:   Hypotension Active Problems:   Diabetes mellitus (HCC)   Hyperlipidemia   Essential hypertension   Dementia (HCC)   Acute renal failure (HCC)   Hypomagnesemia   1 hypotension -Patient presented with hypotension history of autonomic instability and adrenal insufficiency on Florinef prior to admission. -Urine cultures with Aerococcus species likely bacteriuria. -Afebrile.  Normal white count.   -Hypotension resolved with hydration and Florinef.   -Losartan held and likely will not resume on discharge.    2.  Hypertension -Patient on presentation noted to be hypotensive which has improved. -Continue home regimen of Florinef.  -We will likely avoid losartan if patient truly has a mineralocorticoid deficiency and likely will not resume on discharge.  3.  Acute kidney injury of transplanted kidney -Resolved. -Continue Prograf, prednisone. -Avoid nephrotoxic agents -Outpatient follow-up with primary nephrologist.  4.  hypomagnesemia/hypokalemia -Electrolytes repleted.   -Potassium of 4.4, magnesium at 2.5.  -Follow.  5.  COVID-19 infection -Asymptomatic, not hypoxic. -No indication for treatment at this time. -On chronic steroids. -Continue isolation precautions for total of 10 days.  6.  Type 2 diabetes mellitus with hyperglycemia and hyperlipidemia -Hemoglobin A1c 7.3 (08/22/2020). -CBG 181 this morning.   -On chronic steroids.   -Patient noted not to have eating breakfast this morning. -Meal coverage NovoLog discontinued as patient has not been consistent with her meal intake. -Continue current regimen of Levemir 8 units twice daily, SSI. -Follow.  7.  Hypothyroidism -Synthroid.  8.  Severe dementia without behavioral disturbance -Patient oriented to self and place. -Following commands appropriately. -Continue Seroquel, Namenda. -Frequent reorientation, delirium precautions. -Follow.  9.  Depression -Zoloft.  10.  Thrombocytopenia -Felt likely secondary to lab error or platelet clumping.  Resolved.  11.  Hyperlipidemia -Continue statin.   DVT prophylaxis: Heparin Code Status: Full Family Communication: Updated patient.  No family at bedside. Disposition:   Status is: Inpatient  Remains inpatient appropriate because:Unsafe d/c plan  Dispo: The patient is from: ALF              Anticipated d/c is to: ALF once patient completes isolation precautions for COVID-19 on 8/21-22/2022              Patient currently is medically stable to d/c.   Difficult to place patient No       Consultants:  None  Procedures:  Chest x-ray 08/22/2020 Renal ultrasound 08/22/2020   Antimicrobials: None  Subjective: Sleeping but arousable.  No chest pain.  No shortness of breath.  Overall feeling better.  Patient noted not  to have eating breakfast this morning.  Objective: Vitals:   08/29/20 1132 08/29/20 2124 08/30/20 0528 08/30/20 1113  BP: 108/63 129/70 (!) 117/57 (!) 101/41   Pulse: 64 65 68 70  Resp: 20 19 18 20   Temp: 98.7 F (37.1 C) 98.4 F (36.9 C) 98.5 F (36.9 C) 97.7 F (36.5 C)  TempSrc: Oral Oral Oral Oral  SpO2: 98% 94% 98% 95%  Weight:      Height:        Intake/Output Summary (Last 24 hours) at 08/30/2020 1214 Last data filed at 08/29/2020 2200 Gross per 24 hour  Intake 515 ml  Output --  Net 515 ml    Filed Weights   08/24/20 0500 08/28/20 0500 08/29/20 0500  Weight: 67.2 kg 67.1 kg 67.1 kg    Examination:  General exam: : No acute distress. Respiratory system: Lungs clear to auscultation bilaterally.  No wheezes, no crackles, no rhonchi.  Normal respiratory effort.  Speaking in full sentences. Cardiovascular system: RRR no murmurs rubs or gallops.  No JVD.  No lower extremity edema.  Gastrointestinal system: Abdomen is soft, nontender, nondistended, positive bowel sounds.  No rebound.  No guarding.  Central nervous system: Alert.  Moving extremities spontaneously.  No focal neurological deficits.  Extremities: Symmetric 5 x 5 power. Skin: No rashes, lesions or ulcers Psychiatry: Judgement and insight appear poor. Mood & affect appropriate.   Data Reviewed: I have personally reviewed following labs and imaging studies  CBC: Recent Labs  Lab 08/24/20 0433 08/25/20 0336 08/26/20 0320  WBC 6.8 7.0 8.9  HGB 12.6 13.0 13.5  HCT 39.3 40.9 42.7  MCV 76.9* 77.8* 77.6*  PLT 116* 258 248     Basic Metabolic Panel: Recent Labs  Lab 08/24/20 0433 08/25/20 0336 08/26/20 0320 08/27/20 0943 08/28/20 0848 08/29/20 0420  NA 140 141 139 135 138 139  K 3.6 4.5 4.2 5.0 5.1 4.4  CL 110 109 106 101 105 105  CO2 23 26 25 25 23 26   GLUCOSE 82 197* 154* 282* 173* 156*  BUN 12 11 12 21 23 22   CREATININE 0.62 0.57 0.64 0.85 0.88 0.69  CALCIUM 8.0* 8.9 9.8 9.8 9.4 9.8  MG 1.1* 1.6* 1.6* 1.4* 1.6* 2.5*  PHOS 3.7 2.9 3.9 4.5 4.2  --      GFR: Estimated Creatinine Clearance: 57.9 mL/min (by C-G formula based on SCr of 0.69  mg/dL).  Liver Function Tests: Recent Labs  Lab 08/24/20 0433 08/25/20 0336 08/26/20 0320 08/27/20 0943 08/28/20 0848  AST 27  --   --   --   --   ALT 21  --   --   --   --   ALKPHOS 77  --   --   --   --   BILITOT 0.6  --   --   --   --   PROT 6.0*  --   --   --   --   ALBUMIN 2.9* 3.2* 3.3* 3.3* 3.4*     CBG: Recent Labs  Lab 08/29/20 1128 08/29/20 1635 08/29/20 2123 08/30/20 0735 08/30/20 1110  GLUCAP 263* 212* 131* 181* 267*      Recent Results (from the past 240 hour(s))  Urine Culture     Status: Abnormal   Collection Time: 08/22/20 11:06 AM   Specimen: In/Out Cath Urine  Result Value Ref Range Status   Specimen Description   Final    IN/OUT CATH URINE Performed at [facility], [address] [place], [place] [address]    Special Requests   Final    NONE Performed at [facility], [address] [place], [place] [address]    Culture (A)  Final    >=100,000 COLONIES/mL AEROCOCCUS SPECIES Standardized susceptibility testing for this organism is not available. Performed at [facility], [address], [place], [place] [address]    Report Status 08/24/2020 FINAL  Final  Blood Culture (routine x 2)     Status: None   Collection Time: 08/22/20 11:43 AM   Specimen: BLOOD  Result Value Ref Range Status   Specimen Description   Final    BLOOD RIGHT ANTECUBITAL Performed at [facility], [address], [place], [place] [address]    Special Requests   Final    BOTTLES DRAWN AEROBIC AND ANAEROBIC Blood Culture adequate volume Performed at [facility], [address], [place], [place] [address]    Culture   Final    NO GROWTH 5 DAYS Performed at [facility], [address] [place], [place] [address]    Report Status 08/27/2020 FINAL  Final  Blood Culture (routine x 2)     Status: None   Collection Time: 08/22/20 11:50 AM   Specimen: BLOOD  Result Value Ref  Range Status   Specimen Description   Final    BLOOD LEFT ANTECUBITAL Performed at [facility], [address] [place], [place] [address]    Special Requests   Final    BOTTLES DRAWN AEROBIC AND ANAEROBIC Blood Culture results may not be optimal due to an inadequate volume of blood received in culture bottles Performed at [facility], [address], [place], [place] [address]    Culture   Final    NO GROWTH 5 DAYS Performed at [facility], [address] [place], [place] [address]    Report Status 08/27/2020 FINAL  Final  Resp Panel by RT-PCR (Flu A&B, Covid) Nasopharyngeal Swab     Status: Abnormal   Collection Time: 08/22/20  4:29 PM   Specimen: Nasopharyngeal Swab; Nasopharyngeal(NP) swabs in vial transport medium  Result Value Ref Range Status   SARS Coronavirus 2 by RT PCR POSITIVE (A) NEGATIVE Final    Comment: RESULT CALLED TO, READ BACK BY AND VERIFIED WITH: L.DAVIDSON, RN AT 1943 ON 08.11.22 BY N.Traycen Goyer (NOTE) SARS-CoV-2 target nucleic acids are DETECTED.  The SARS-CoV-2 RNA is generally detectable in upper respiratory specimens during the acute phase of infection. Positive results are indicative of the presence of the identified virus, but do not rule out bacterial infection or co-infection with other pathogens not detected by the test. Clinical correlation with patient history and other diagnostic information is necessary to determine patient infection status. The expected result is Negative.  Fact Sheet for Patients: BloggerCourse.com  Fact Sheet for Healthcare Providers: SeriousBroker.it  This test is not yet approved or cleared by the [place] FDA and  has been authorized for detection and/or diagnosis of SARS-CoV-2 by FDA under an Emergency Use Authorization (EUA).  This EUA will remain in effect (meaning t his test can be used) for the duration of  the  COVID-19 declaration under Section 564(b)(1) of the Act, 21 U.S.C. section 360bbb-3(b)(1), unless the authorization is terminated or revoked sooner.     Influenza A by PCR NEGATIVE NEGATIVE Final   Influenza B by PCR NEGATIVE NEGATIVE Final    Comment: (NOTE) The Xpert  Xpress SARS-CoV-2/FLU/RSV plus assay is intended as an aid in the diagnosis of influenza from Nasopharyngeal swab specimens and should not be used as a sole basis for treatment. Nasal washings and aspirates are unacceptable for Xpert Xpress SARS-CoV-2/FLU/RSV testing.  Fact Sheet for Patients: BloggerCourse.comhttps://www.fda.gov/media/152166/download  Fact Sheet for Healthcare Providers: SeriousBroker.ithttps://www.fda.gov/media/152162/download  This test is not yet approved or cleared by the [place] FDA and has been authorized for detection and/or diagnosis of SARS-CoV-2 by FDA under an Emergency Use Authorization (EUA). This EUA will remain in effect (meaning this test can be used) for the duration of the COVID-19 declaration under Section 564(b)(1) of the Act, 21 U.S.C. section 360bbb-3(b)(1), unless the authorization is terminated or revoked.  Performed at [facility], [address] [place], [place] [id number]           Radiology Studies: No results found.      Scheduled Meds:  (feeding supplement) PROSource Plus  30 mL Oral BID BM   cholecalciferol  2,000 Units Oral Daily   feeding supplement  237 mL Oral Q24H   feeding supplement (GLUCERNA SHAKE)  237 mL Oral BID BM   fludrocortisone  0.1 mg Oral Daily   heparin  5,000 Units Subcutaneous Q8H   insulin aspart  0-15 Units Subcutaneous TID WC   insulin aspart  0-5 Units Subcutaneous QHS   insulin detemir  8 Units Subcutaneous BID   levothyroxine  100 mcg Oral Q0600   memantine  10 mg Oral Q12H   multivitamin with minerals  1 tablet Oral Daily   predniSONE  5 mg Oral q morning   QUEtiapine  25 mg Oral QHS   rosuvastatin  5 mg Oral QHS    tacrolimus  0.5 mg Oral Daily   Continuous Infusions:   LOS: 7 days    Time spent: 35 minutes    Ramiro Harvestaniel Sherran Margolis, MD [facility]   To contact the attending provider between 7A-7P or the covering provider during after hours 7P-7A, please log into the web site [email or web address] and access using universal [place] password for that web site. If you do not have the password, please call the hospital operator.  08/30/2020, 12:14 PM

## 2020-08-30 NOTE — TOC Initial Note (Addendum)
Transition of Care [facility]) - Initial/Assessment Note    Patient Details  Name: Emma Maldonado MRN: [id number] Date of Birth: 11-Apr-1950  Transition of Care [facility]) CM/SW Contact:    Darleene Cleaver, [place] Phone Number: 08/27/2020 12:49pm  Clinical Narrative:                  Patient is from [facility] ALF memory care, patient can return once he has completed his isolation period which will be 09/02/2020.  Patient has been living at ALF for several years.  CSW to continue to follow patient's progress throughout discharge planning.  Expected Discharge Plan: Skilled Nursing Facility Barriers to Discharge: Continued Medical Work up   Patient Goals and CMS Choice Patient states their goals for this hospitalization and ongoing recovery are:: To return back to [facility] ALF. CMS Medicare.gov Compare Post Acute Care list provided to:: Patient Choice offered to / list presented to : Patient  Expected Discharge Plan and Services Expected Discharge Plan: Skilled Nursing Facility       Living arrangements for the past 2 months: Assisted Living Facility                                      Prior Living Arrangements/Services Living arrangements for the past 2 months: Assisted Living Facility Lives with:: Facility Resident Patient language and need for interpreter reviewed:: Yes Do you feel safe going back to the place where you live?: Yes      Need for Family Participation in Patient Care: Yes (Comment) Care giver support system in place?: Yes (comment)   Criminal Activity/Legal Involvement Pertinent to Current Situation/Hospitalization: No - Comment as needed  Activities of Daily Living Home Assistive Devices/Equipment: Other (Comment) (pt unable to verbalize) ADL Screening (condition at time of admission) Patient's cognitive ability adequate to safely complete daily activities?: No Is the patient deaf or have difficulty hearing?: Yes Does the patient have  difficulty seeing, even when wearing glasses/contacts?: No Does the patient have difficulty concentrating, remembering, or making decisions?: Yes Patient able to express need for assistance with ADLs?: Yes Does the patient have difficulty dressing or bathing?: Yes Independently performs ADLs?: No Communication: Independent Dressing (OT): Needs assistance Is this a change from baseline?: Pre-admission baseline Grooming: Needs assistance Is this a change from baseline?: Pre-admission baseline Feeding: Needs assistance Is this a change from baseline?: Pre-admission baseline Bathing: Needs assistance Is this a change from baseline?: Pre-admission baseline Toileting: Needs assistance Is this a change from baseline?: Pre-admission baseline In/Out Bed: Needs assistance Is this a change from baseline?: Pre-admission baseline Walks in Home: Needs assistance Is this a change from baseline?: Pre-admission baseline Does the patient have difficulty walking or climbing stairs?: Yes Weakness of Legs: Both Weakness of Arms/Hands: Both  Permission Sought/Granted Permission sought to share information with : Facility Medical sales representative, Family Supports Permission granted to share information with : Yes, Release of Information Signed  Share Information with NAME: Memory Care ALF           Emotional Assessment Appearance:: Appears stated age   Affect (typically observed): Appropriate, Calm Orientation: : Oriented to Self Alcohol / Substance Use: Not Applicable Psych Involvement: No (comment)  Admission diagnosis:  History of renal transplant [Z94.0] Hypotension [I95.9] Acute renal failure, unspecified acute renal failure type [facility]) [N17.9] Patient Active Problem List   Diagnosis Date Noted   Hypomagnesemia    Acute renal failure (  HCC)    Hypotension 08/22/2020   Hydronephrosis of right kidney 05/01/2020   Rib fractures 05/01/2020   UTI (urinary tract infection) 03/25/2020    Diabetes mellitus type 2, uncomplicated (HCC) 03/25/2020   Hypokalemia 03/25/2020   Essential hypertension 03/25/2020   Dementia (HCC) 03/25/2020   Altered mental status 03/25/2020   Uncontrolled type 2 diabetes mellitus with hyperglycemia (HCC) 03/25/2020   Accelerated hypertension 03/28/2012   Viral gastroenteritis 03/28/2012   Sepsis (HCC) 03/26/2012   History of renal transplant 03/26/2012   Diabetes mellitus (HCC) 03/26/2012   Hyperlipidemia 03/26/2012   PCP:  Patient, No Pcp Per (Inactive) Pharmacy:   [facility] [place], [place] - [address] [place]. [address] [place] [address] Phone: [phone] Fax: [phone]     Social Determinants of Health (SDOH) Interventions    Readmission Risk Interventions No flowsheet data found.

## 2020-08-30 NOTE — Care Management Important Message (Signed)
Important Message  Patient Details Verbal Consent to IM by [name] Name: Emma Maldonado MRN: [id number] Date of Birth: 02-13-1950   Medicare Important Message Given:  Yes     [name] 08/30/2020, 12:20 PM

## 2020-08-30 NOTE — Progress Notes (Signed)
Occupational Therapy Treatment Patient Details Name: Emma Maldonado MRN: [id number] DOB: [birth year] Today's Date: 08/30/2020    History of present illness 70 y.o. female brought by EMS from [place] for evaluation of low blood pressure on 08/22/20. Admitted with hypotension and hypoglycemia.  Pt also found to be COVID +. Pt with medical history significant of hypertension, dementia, history of renal transplant on immunosuppressant followed by her nephrologist at [facility], .   OT comments  Patient is at baseline for ADLs at this time. Patient to d/c back to ALF with memory care at time of d/c patient and nurse educated on d/c from skilled OT services. Patient and nurse verbalized understanding. Patient met all goals at this time.   Follow Up Recommendations  No OT follow up;Other (comment) (patient recommended to transition back to ALF memory care.)    Equipment Recommendations  None recommended by OT    Recommendations for Other Services      Precautions / Restrictions Precautions Precautions: Fall Restrictions Weight Bearing Restrictions: No       Mobility Bed Mobility   Bed Mobility: Sidelying to Sit;Supine to Sit;Rolling Rolling: Supervision Sidelying to sit: Supervision            Transfers                      Balance                                           ADL either performed or assessed with clinical judgement   ADL Overall ADL's : At baseline                                       General ADL Comments: patient is at baseline for ADLs at this time. patient is SUP for transfers and functional mobility. patietns PLOF is ALF memory care. patient is in a functional level to transition back to ALF memory care from [place] Patient Visual Report: No change from baseline     Perception     Praxis      Cognition Arousal/Alertness: Awake/alert Behavior During Therapy: WFL for tasks  assessed/performed Overall Cognitive Status: History of cognitive impairments - at baseline                                 General Comments: hx dementia, able to communicate with simple brief commands/sentences        Exercises     Shoulder Instructions       General Comments      Pertinent Vitals/ Pain       Pain Assessment: No/denies pain  Home Living                                          Prior Functioning/Environment              Frequency           Progress Toward Goals  OT Goals(current goals can now be found in the care plan section)  Progress towards OT goals: Goals met/education completed, patient discharged  from OT  Acute Rehab OT Goals OT Goal Formulation: All assessment and education complete, DC therapy ADL Goals Pt Will Transfer to Toilet: with supervision;ambulating Pt Will Perform Toileting - Clothing Manipulation and hygiene: with supervision;sit to/from stand Additional ADL Goal #1: patient is able to participate in functional activites for 10 mins as patient will allow.  Plan Discharge plan remains appropriate    Co-evaluation                 AM-PAC OT "6 Clicks" Daily Activity     Outcome Measure   Help from another person eating meals?: None Help from another person taking care of personal grooming?: None Help from another person toileting, which includes using toliet, bedpan, or urinal?: A Little Help from another person bathing (including washing, rinsing, drying)?: A Little Help from another person to put on and taking off regular upper body clothing?: A Little Help from another person to put on and taking off regular lower body clothing?: A Little 6 Click Score: 20    End of Session    OT Visit Diagnosis: Unsteadiness on feet (R26.81)   Activity Tolerance     Patient Left in bed   Nurse Communication Other (comment) (consult over d/c from skilled OT services on this date.)         Time: 1530-1541 OT Time Calculation (min): 11 min  Charges: OT General Charges $OT Visit: 1 Visit OT Treatments $Self Care/Home Management : 8-22 mins  Jackelyn Poling OTR/L, MS Acute Rehabilitation Department Office# [phone] Pager# [place] 08/30/2020, 3:45 PM

## 2020-08-30 NOTE — TOC Transition Note (Deleted)
Transition of Care [facility]) - CM/SW Discharge Note   Patient Details  Name: Emma Maldonado MRN: [id number] Date of Birth: August 13, 1950  Transition of Care [facility]) CM/SW Contact:  Darleene Cleaver, LCSW Phone Number: 08/30/2020, 12:10 PM   Clinical Narrative:     Patient is from [facility]F memory care, patient can return once he has completed his isolation period which will be 09/02/2020.    Final next level of care: Assisted Living Barriers to Discharge: Continued Medical Work up   Patient Goals and CMS Choice Patient states their goals for this hospitalization and ongoing recovery are:: To return back to [facility] ALF. CMS Medicare.gov Compare Post Acute Care list provided to:: Patient Choice offered to / list presented to : Patient  Discharge Placement                       Discharge Plan and Services                                     Social Determinants of Health (SDOH) Interventions     Readmission Risk Interventions No flowsheet data found.

## 2020-08-30 NOTE — TOC Progression Note (Addendum)
Transition of Care [facility]) - Progression Note    Patient Details  Name: Emma Maldonado MRN: [id number] Date of Birth: Jan 01, 1951  Transition of Care [facility]) CM/SW Contact  Darleene Cleaver, [place] Phone Number: 08/30/2020, 12:04 PM  Clinical Narrative:     CSW attempted to contact [facility] ALF, had to leave a message on voice mail.  CSW continuing to follow patient's progress throughout discharge planning.  3:00pm  CSW spoke to [facility], they said patient should be able to return on Monday if medically ready, will need copy of dc summary and updated FL2 faxed to [phone].        Expected Discharge Plan and Services  Back to [facility] ALF Memory Care.                                               Social Determinants of Health (SDOH) Interventions    Readmission Risk Interventions No flowsheet data found.

## 2020-08-31 LAB — GLUCOSE, CAPILLARY
Glucose-Capillary: 229 mg/dL — ABNORMAL HIGH (ref 70–99)
Glucose-Capillary: 391 mg/dL — ABNORMAL HIGH (ref 70–99)
Glucose-Capillary: 250 mg/dL — ABNORMAL HIGH (ref 70–99)
Glucose-Capillary: 161 mg/dL — ABNORMAL HIGH (ref 70–99)

## 2020-08-31 MED ORDER — INSULIN DETEMIR 100 UNIT/ML ~~LOC~~ SOLN
12.0000 [IU] | Freq: Two times a day (BID) | SUBCUTANEOUS | Status: DC
  Administered 2020-08-31 – 2020-09-01 (×2): 12 [IU] via SUBCUTANEOUS
  Filled 2020-08-31 (×3): qty 0.12

## 2020-08-31 MED ORDER — INSULIN DETEMIR 100 UNIT/ML ~~LOC~~ SOLN
10.0000 [IU] | Freq: Two times a day (BID) | SUBCUTANEOUS | Status: DC
  Administered 2020-08-31: 10 [IU] via SUBCUTANEOUS
  Filled 2020-08-31 (×2): qty 0.1

## 2020-08-31 NOTE — Progress Notes (Signed)
PROGRESS NOTE    Emma Maldonado  SVX:[id number] DOB: May 24, 1950 DOA: 08/22/2020 PCP: Patient, No Pcp Per (Inactive)    Chief Complaint  Patient presents with   Shortness of Breath    Brief Narrative:  70 year old ALF resident with a history of HTN, dementia, IDDM-2, hypothyroidism, adrenal insufficiency on Florinef, and renal transplant on chronic immunosuppressants who was transported to the ER from her facility after she was found to have "low blood pressures."  EMS noted pressures of 60/40 on arrival as well as a blood sugar of 51.  There was no reported loss of consciousness,fevers, chills or falls.  She was admitted for hypotension, hypoglycemia and AKI.  Also tested positive for COVID-19 but did not require treatment.  Still with significant electrolyte derangement including hypomagnesemia and hypokalemia.  Otherwise, she is a stable for discharge to ALF once she completes isolation precaution on 8/21.     Assessment & Plan:   Principal Problem:   Hypotension Active Problems:   Diabetes mellitus (HCC)   Hyperlipidemia   Essential hypertension   Dementia (HCC)   Acute renal failure (HCC)   Hypomagnesemia   1 hypotension -Patient presented with hypotension history of autonomic instability and adrenal insufficiency on Florinef prior to admission. -Urine cultures with Aerococcus species likely bacteriuria. -Afebrile.  Normal white count.   -Hypotension resolved with hydration and Florinef.   -Losartan held and likely will not resume on discharge.    2.  Hypertension -Patient on presentation noted to be hypotensive which has improved. -Florinef.   -We will likely avoid losartan if patient truly has a mineralocorticoid deficiency and likely will not resume on discharge.  3.  Acute kidney injury of transplanted kidney -Resolved.   -Continue Prograf, prednisone.   -Avoid nephrotoxic agents.   -Outpatient follow-up with primary nephrologist.   4.  hypomagnesemia/hypokalemia -Electrolytes repleted.   -Follow.  5.  COVID-19 infection -Asymptomatic, not hypoxic. -No indication for treatment at this time. -On chronic steroids. -Continue isolation precautions for total of 10 days.  6.  Type 2 diabetes mellitus with hyperglycemia and hyperlipidemia -Hemoglobin A1c 7.3 (08/22/2020). -CBG 229 this morning.   -On chronic steroids.   -Patient not eating consistently and as such meal coverage NovoLog discontinued.   -Increase Levemir to 12 units twice daily.   -SSI.    7.  Hypothyroidism -Continue Synthroid.  8.  Severe dementia without behavioral disturbance -Patient oriented to self and place. -Following commands appropriately. -Continue Seroquel, Namenda. -Frequent reorientation, delirium precautions. -Follow.  9.  Depression -Continue Zoloft.    10.  Thrombocytopenia -Felt likely secondary to lab error or platelet clumping.  Resolved.  11.  Hyperlipidemia -Patient maintained on statin.    DVT prophylaxis: Heparin Code Status: Full Family Communication: Updated patient.  No family at bedside. Disposition:   Status is: Inpatient  Remains inpatient appropriate because:Unsafe d/c plan  Dispo: The patient is from: ALF              Anticipated d/c is to: ALF once patient completes isolation precautions for COVID-19 on 09/02/2020              Patient currently is medically stable to d/c.   Difficult to place patient No       Consultants:  None  Procedures:  Chest x-ray 08/22/2020 Renal ultrasound 08/22/2020   Antimicrobials: None  Subjective: Asleep but arousable.  No chest pain.  No shortness of breath.  Patient eating about 25% of her meal today per RN.  Objective: Vitals:   08/30/20 1113 08/30/20 2000 08/31/20 0300 08/31/20 0859  BP: (!) 101/41 (!) 145/55 130/61 127/71  Pulse: 70 78 68 73  Resp: 20 18 18 16   Temp: 97.7 F (36.5 C) 98.7 F (37.1 C) 98.5 F (36.9 C) 98.6 F (37 C)  TempSrc:  Oral Oral Oral Oral  SpO2: 95% 96% 96% 98%  Weight:      Height:        Intake/Output Summary (Last 24 hours) at 08/31/2020 1139 Last data filed at 08/31/2020 0300 Gross per 24 hour  Intake 390 ml  Output 0 ml  Net 390 ml    Filed Weights   08/24/20 0500 08/28/20 0500 08/29/20 0500  Weight: 67.2 kg 67.1 kg 67.1 kg    Examination:  General exam: : NAD Respiratory system: CTAB anterior lung fields.  No wheezes, no rhonchi.  Speaking in full sentences.  Normal respiratory effort. Cardiovascular system: Regular rate and rhythm no murmurs rubs or gallops.  No JVD.  No lower extremity edema.  Gastrointestinal system: Abdomen soft, nontender, nondistended, positive bowel sounds.  No rebound.  No guarding. Central nervous system: Alert. No focal neurological deficits. Extremities: Symmetric 5 x 5 power. Skin: No rashes, lesions or ulcers Psychiatry: Judgement and insight appear poor. Mood & affect appropriate.  Data Reviewed: I have personally reviewed following labs and imaging studies  CBC: Recent Labs  Lab 08/25/20 0336 08/26/20 0320  WBC 7.0 8.9  HGB 13.0 13.5  HCT 40.9 42.7  MCV 77.8* 77.6*  PLT 258 248     Basic Metabolic Panel: Recent Labs  Lab 08/25/20 0336 08/26/20 0320 08/27/20 0943 08/28/20 0848 08/29/20 0420  NA 141 139 135 138 139  K 4.5 4.2 5.0 5.1 4.4  CL 109 106 101 105 105  CO2 26 25 25 23 26   GLUCOSE 197* 154* 282* 173* 156*  BUN 11 12 21 23 22   CREATININE 0.57 0.64 0.85 0.88 0.69  CALCIUM 8.9 9.8 9.8 9.4 9.8  MG 1.6* 1.6* 1.4* 1.6* 2.5*  PHOS 2.9 3.9 4.5 4.2  --      GFR: Estimated Creatinine Clearance: 57.9 mL/min (by C-G formula based on SCr of 0.69 mg/dL).  Liver Function Tests: Recent Labs  Lab 08/25/20 0336 08/26/20 0320 08/27/20 0943 08/28/20 0848  ALBUMIN 3.2* 3.3* 3.3* 3.4*     CBG: Recent Labs  Lab 08/30/20 0735 08/30/20 1110 08/30/20 1723 08/30/20 2004 08/31/20 0724  GLUCAP 181* 267* 311* 324* 229*       Recent Results (from the past 240 hour(s))  Urine Culture     Status: Abnormal   Collection Time: 08/22/20 11:06 AM   Specimen: In/Out Cath Urine  Result Value Ref Range Status   Specimen Description   Final    IN/OUT CATH URINE Performed at [facility], [address], [place], [place]    Special Requests   Final    NONE Performed at [facility], [address] [place], [place]    Culture (A)  Final    >=100,000 COLONIES/mL AEROCOCCUS SPECIES Standardized susceptibility testing for this organism is not available. Performed at [facility], [address], [place], [facility] [address]    Report Status 08/24/2020 FINAL  Final  Blood Culture (routine x 2)     Status: None   Collection Time: 08/22/20 11:43 AM   Specimen: BLOOD  Result Value Ref Range Status   Specimen Description   Final  BLOOD RIGHT ANTECUBITAL Performed at [facility], [address] [place], [place] [address]    Special Requests   Final    BOTTLES DRAWN AEROBIC AND ANAEROBIC Blood Culture adequate volume Performed at [facility], [address] [place], [place] [address]    Culture   Final    NO GROWTH 5 DAYS Performed at [facility], [address] [place], [place] [address]    Report Status 08/27/2020 FINAL  Final  Blood Culture (routine x 2)     Status: None   Collection Time: 08/22/20 11:50 AM   Specimen: BLOOD  Result Value Ref Range Status   Specimen Description   Final    BLOOD LEFT ANTECUBITAL Performed at [facility], [address] [place], [place] [address]    Special Requests   Final    BOTTLES DRAWN AEROBIC AND ANAEROBIC Blood Culture results may not be optimal due to an inadequate volume of blood received in culture bottles Performed at [facility], [address] [place], [place] [address]    Culture   Final    NO GROWTH  5 DAYS Performed at [facility], [address] [place], [place] [address]    Report Status 08/27/2020 FINAL  Final  Resp Panel by RT-PCR (Flu A&B, Covid) Nasopharyngeal Swab     Status: Abnormal   Collection Time: 08/22/20  4:29 PM   Specimen: Nasopharyngeal Swab; Nasopharyngeal(NP) swabs in vial transport medium  Result Value Ref Range Status   SARS Coronavirus 2 by RT PCR POSITIVE (A) NEGATIVE Final    Comment: RESULT CALLED TO, READ BACK BY AND VERIFIED WITH: L.DAVIDSON, RN AT 1943 ON 08.11.22 BY N.Aislyn Hayse (NOTE) SARS-CoV-2 target nucleic acids are DETECTED.  The SARS-CoV-2 RNA is generally detectable in upper respiratory specimens during the acute phase of infection. Positive results are indicative of the presence of the identified virus, but do not rule out bacterial infection or co-infection with other pathogens not detected by the test. Clinical correlation with patient history and other diagnostic information is necessary to determine patient infection status. The expected result is Negative.  Fact Sheet for Patients: BloggerCourse.comhttps://www.fda.gov/media/152166/download  Fact Sheet for Healthcare Providers: SeriousBroker.ithttps://www.fda.gov/media/152162/download  This test is not yet approved or cleared by the [place] FDA and  has been authorized for detection and/or diagnosis of SARS-CoV-2 by FDA under an Emergency Use Authorization (EUA).  This EUA will remain in effect (meaning t his test can be used) for the duration of  the COVID-19 declaration under Section 564(b)(1) of the Act, 21 U.S.C. section 360bbb-3(b)(1), unless the authorization is terminated or revoked sooner.     Influenza A by PCR NEGATIVE NEGATIVE Final   Influenza B by PCR NEGATIVE NEGATIVE Final    Comment: (NOTE) The Xpert Xpress SARS-CoV-2/FLU/RSV plus assay is intended as an aid in the diagnosis of influenza from Nasopharyngeal swab specimens and should not be used as a sole basis for treatment. Nasal  washings and aspirates are unacceptable for Xpert Xpress SARS-CoV-2/FLU/RSV testing.  Fact Sheet for Patients: BloggerCourse.comhttps://www.fda.gov/media/152166/download  Fact Sheet for Healthcare Providers: SeriousBroker.ithttps://www.fda.gov/media/152162/download  This test is not yet approved or cleared by the [place] FDA and has been authorized for detection and/or diagnosis of SARS-CoV-2 by FDA under an Emergency Use Authorization (EUA). This EUA will remain in effect (meaning this test can be used) for the duration of the COVID-19 declaration under Section 564(b)(1) of the Act, 21 U.S.C. section 360bbb-3(b)(1), unless  the authorization is terminated or revoked.  Performed at [facility], [address], [place], [place] [address]           Radiology Studies: No results found.      Scheduled Meds:  (feeding supplement) PROSource Plus  30 mL Oral BID BM   cholecalciferol  2,000 Units Oral Daily   feeding supplement  237 mL Oral Q24H   feeding supplement (GLUCERNA SHAKE)  237 mL Oral BID BM   fludrocortisone  0.1 mg Oral Daily   heparin  5,000 Units Subcutaneous Q8H   insulin aspart  0-15 Units Subcutaneous TID WC   insulin aspart  0-5 Units Subcutaneous QHS   insulin detemir  10 Units Subcutaneous BID   levothyroxine  100 mcg Oral Q0600   memantine  10 mg Oral Q12H   multivitamin with minerals  1 tablet Oral Daily   predniSONE  5 mg Oral q morning   QUEtiapine  25 mg Oral QHS   rosuvastatin  5 mg Oral QHS   tacrolimus  0.5 mg Oral Daily   Continuous Infusions:   LOS: 8 days    Time spent: 35 minutes    Ramiro Harvest, MD [facility]   To contact the attending provider between 7A-7P or the covering provider during after hours 7P-7A, please log into the web site [email or web address] and access using universal [place] password for that web site. If you do not have the password, please call the hospital operator.  08/31/2020, 11:39 AM

## 2020-09-01 LAB — BASIC METABOLIC PANEL
Anion gap: 8 (ref 5–15)
BUN: 19 mg/dL (ref 8–23)
CO2: 25 mmol/L (ref 22–32)
Calcium: 9.5 mg/dL (ref 8.9–10.3)
Chloride: 108 mmol/L (ref 98–111)
Creatinine, Ser: 0.73 mg/dL (ref 0.44–1.00)
GFR, Estimated: >60 mL/min (ref >60)
Glucose, Bld: 218 mg/dL — ABNORMAL HIGH (ref 70–99)
Potassium: 4 mmol/L (ref 3.5–5.1)
Sodium: 141 mmol/L (ref 135–145)

## 2020-09-01 LAB — GLUCOSE, CAPILLARY: Glucose-Capillary: 210 mg/dL — ABNORMAL HIGH (ref 70–99)

## 2020-09-01 LAB — MAGNESIUM: Magnesium: 1.7 mg/dL (ref 1.7–2.4)

## 2020-09-01 MED ORDER — INSULIN DETEMIR 100 UNIT/ML ~~LOC~~ SOLN
14.0000 [IU] | Freq: Two times a day (BID) | SUBCUTANEOUS | Status: DC
  Administered 2020-09-01 – 2020-09-02 (×2): 14 [IU] via SUBCUTANEOUS
  Filled 2020-09-01 (×3): qty 0.14

## 2020-09-01 MED ORDER — MAGNESIUM SULFATE 4 GM/100ML IV SOLN
4.0000 g | Freq: Once | INTRAVENOUS | Status: AC
  Administered 2020-09-01: 4 g via INTRAVENOUS
  Filled 2020-09-01: qty 100

## 2020-09-01 NOTE — Progress Notes (Signed)
PROGRESS NOTE    Emma Maldonado  ZOX:[id number] DOB: [birth year] DOA: 08/22/2020 PCP: Patient, No Pcp Per (Inactive)    Chief Complaint  Patient presents with   Shortness of Breath    Brief Narrative:  70 year old ALF resident with a history of HTN, dementia, IDDM-2, hypothyroidism, adrenal insufficiency on Florinef, and renal transplant on chronic immunosuppressants who was transported to the ER from her facility after she was found to have "low blood pressures."  EMS noted pressures of 60/40 on arrival as well as a blood sugar of 51.  There was no reported loss of consciousness,fevers, chills or falls.  She was admitted for hypotension, hypoglycemia and AKI.  Also tested positive for COVID-19 but did not require treatment.  Still with significant electrolyte derangement including hypomagnesemia and hypokalemia.  Otherwise, she is a stable for discharge to ALF once she completes isolation precaution on 8/21.     Assessment & Plan:   Principal Problem:   Hypotension Active Problems:   Diabetes mellitus (HCC)   Hyperlipidemia   Essential hypertension   Dementia (HCC)   Acute renal failure (HCC)   Hypomagnesemia   1 hypotension -Patient presented with hypotension history of autonomic instability and adrenal insufficiency on Florinef prior to admission. -Urine cultures with Aerococcus species likely bacteriuria. -Afebrile.  Normal white count.   -Hypotension resolved with hydration and Florinef.   -Losartan held and likely will not resume on discharge.    2.  Hypertension -Patient on presentation noted to be hypotensive which has improved. -On Florinef . -We will likely avoid losartan if patient truly has a mineralocorticoid deficiency and likely will not resume on discharge. -Blood pressure control needed may start low-dose Norvasc.  3.  Acute kidney injury of transplanted kidney -Resolved.   -Continue Prograf, prednisone.   -Avoid nephrotoxic agents.   -Outpatient  follow-up with primary nephrologist.   4. hypomagnesemia/hypokalemia -Magnesium at 1.7.  Potassium at 4.0.   -Magnesium sulfate 4 g IV x1.   -Follow.   5.  COVID-19 infection -Asymptomatic, not hypoxic. -No indication for treatment at this time. -On chronic steroids. -Continue isolation precautions for total of 10 days.  6.  Type 2 diabetes mellitus with hyperglycemia and hyperlipidemia -Hemoglobin A1c 7.3 (08/22/2020). -CBG 210 this morning.   -Patient on chronic steroids.   -Patient not eating consistently and as such meal coverage discontinued.   -Increase Levemir to 14 units twice daily.   -SSI.  7.  Hypothyroidism -Synthroid.  8.  Severe dementia without behavioral disturbance -Patient oriented to self and place. -Following commands appropriately. -Continue Seroquel, Namenda. -Frequent reorientation, delirium precautions. -Follow.  9.  Depression -Zoloft.    10.  Thrombocytopenia -Felt likely secondary to lab error or platelet clumping.  Resolved.  11.  Hyperlipidemia -Statin.   DVT prophylaxis: Heparin Code Status: Full Family Communication: Updated patient.  No family at bedside. Disposition:   Status is: Inpatient  Remains inpatient appropriate because:Unsafe d/c plan  Dispo: The patient is from: ALF              Anticipated d/c is to: ALF once patient completes isolation precautions for COVID-19 on 09/02/2020              Patient currently is medically stable to d/c.   Difficult to place patient No       Consultants:  None  Procedures:  Chest x-ray 08/22/2020 Renal ultrasound 08/22/2020   Antimicrobials: None  Subjective: Sleeping but arousable.  Denies any chest pain.  No shortness  of breath.  Oral intake fluctuating per RN  Objective: Vitals:   08/31/20 1333 08/31/20 2050 09/01/20 0500 09/01/20 0522  BP: (!) 156/70 (!) 165/67  (!) 118/59  Pulse: 72 66  70  Resp: 16 16  16   Temp: 98.4 F (36.9 C) 97.7 F (36.5 C)  98.7 F (37.1  C)  TempSrc: Oral Oral  Oral  SpO2: 98% 99%  96%  Weight:   69.1 kg   Height:        Intake/Output Summary (Last 24 hours) at 09/01/2020 1103 Last data filed at 08/31/2020 1854 Gross per 24 hour  Intake 480 ml  Output --  Net 480 ml    Filed Weights   08/28/20 0500 08/29/20 0500 09/01/20 0500  Weight: 67.1 kg 67.1 kg 69.1 kg    Examination:  General exam: : NAD Respiratory system: CTA B anterior lung fields.  No wheezes, no rhonchi.  Speaking in full sentences.  Normal respiratory effort. Cardiovascular system: Regular rate and rhythm no murmurs rubs or gallops.  No JVD.  No lower extremity edema.  Gastrointestinal system: Abdomen soft, nontender, nondistended, positive bowel sounds.  No rebound.  No guarding. Central nervous system: Alert and oriented. No focal neurological deficits. Extremities: Symmetric 5 x 5 power. Skin: No rashes, lesions or ulcers Psychiatry: Judgement and insight appear poor to fair. Mood & affect appropriate.  Data Reviewed: I have personally reviewed following labs and imaging studies  CBC: Recent Labs  Lab 08/26/20 0320  WBC 8.9  HGB 13.5  HCT 42.7  MCV 77.6*  PLT 248     Basic Metabolic Panel: Recent Labs  Lab 08/26/20 0320 08/27/20 0943 08/28/20 0848 08/29/20 0420 09/01/20 0821  NA 139 135 138 139 141  K 4.2 5.0 5.1 4.4 4.0  CL 106 101 105 105 108  CO2 25 25 23 26 25   GLUCOSE 154* 282* 173* 156* 218*  BUN 12 21 23 22 19   CREATININE 0.64 0.85 0.88 0.69 0.73  CALCIUM 9.8 9.8 9.4 9.8 9.5  MG 1.6* 1.4* 1.6* 2.5* 1.7  PHOS 3.9 4.5 4.2  --   --      GFR: Estimated Creatinine Clearance: 58.8 mL/min (by C-G formula based on SCr of 0.73 mg/dL).  Liver Function Tests: Recent Labs  Lab 08/26/20 0320 08/27/20 0943 08/28/20 0848  ALBUMIN 3.3* 3.3* 3.4*     CBG: Recent Labs  Lab 08/31/20 0724 08/31/20 1153 08/31/20 1623 08/31/20 2114 09/01/20 0802  GLUCAP 229* 391* 250* 161* 210*      Recent Results (from the  past 240 hour(s))  Urine Culture     Status: Abnormal   Collection Time: 08/22/20 11:06 AM   Specimen: In/Out Cath Urine  Result Value Ref Range Status   Specimen Description   Final    IN/OUT CATH URINE Performed at [facility], [address] [place], [place]    Special Requests   Final    NONE Performed at [facility], [address] [place], [place]    Culture (A)  Final    >=100,000 COLONIES/mL AEROCOCCUS SPECIES Standardized susceptibility testing for this organism is not available. Performed at [facility], [address], [place], [facility] [address]    Report Status 08/24/2020 FINAL  Final  Blood Culture (routine x 2)     Status: None   Collection Time: 08/22/20 11:43 AM   Specimen: BLOOD  Result Value Ref Range Status   Specimen Description  Final    BLOOD RIGHT ANTECUBITAL Performed at [facility], [address], [place], [place] [address]    Special Requests   Final    BOTTLES DRAWN AEROBIC AND ANAEROBIC Blood Culture adequate volume Performed at [facility], [address] [place], [place] [address]    Culture   Final    NO GROWTH 5 DAYS Performed at [facility], [address], [place], [place] [address]    Report Status 08/27/2020 FINAL  Final  Blood Culture (routine x 2)     Status: None   Collection Time: 08/22/20 11:50 AM   Specimen: BLOOD  Result Value Ref Range Status   Specimen Description   Final    BLOOD LEFT ANTECUBITAL Performed at [facility], [address] [place], [place] [address]    Special Requests   Final    BOTTLES DRAWN AEROBIC AND ANAEROBIC Blood Culture results may not be optimal due to an inadequate volume of blood received in culture bottles Performed at [facility], [address], [place], [place] [address]    Culture   Final    NO GROWTH 5 DAYS Performed at [facility], [address], [place], [place] [address]    Report Status 08/27/2020 FINAL  Final  Resp Panel by RT-PCR (Flu A&B, Covid) Nasopharyngeal Swab     Status: Abnormal   Collection Time: 08/22/20  4:29 PM   Specimen: Nasopharyngeal Swab; Nasopharyngeal(NP) swabs in vial transport medium  Result Value Ref Range Status   SARS Coronavirus 2 by RT PCR POSITIVE (A) NEGATIVE Final    Comment: RESULT CALLED TO, READ BACK BY AND VERIFIED WITH: L.DAVIDSON, RN AT 1943 ON 08.11.22 BY N.Valorie Mcgrory (NOTE) SARS-CoV-2 target nucleic acids are DETECTED.  The SARS-CoV-2 RNA is generally detectable in upper respiratory specimens during the acute phase of infection. Positive results are indicative of the presence of the identified virus, but do not rule out bacterial infection or co-infection with other pathogens not detected by the test. Clinical correlation with patient history and other diagnostic information is necessary to determine patient infection status. The expected result is Negative.  Fact Sheet for Patients: BloggerCourse.com  Fact Sheet for Healthcare Providers: SeriousBroker.it  This test is not yet approved or cleared by the [place] FDA and  has been authorized for detection and/or diagnosis of SARS-CoV-2 by FDA under an Emergency Use Authorization (EUA).  This EUA will remain in effect (meaning t his test can be used) for the duration of  the COVID-19 declaration under Section 564(b)(1) of the Act, 21 U.S.C. section 360bbb-3(b)(1), unless the authorization is terminated or revoked sooner.     Influenza A by PCR NEGATIVE NEGATIVE Final   Influenza B by PCR NEGATIVE NEGATIVE Final    Comment: (NOTE) The Xpert Xpress SARS-CoV-2/FLU/RSV plus assay is intended as an aid in the diagnosis of influenza from Nasopharyngeal swab specimens and should not be used as a sole basis for treatment. Nasal washings and aspirates are  unacceptable for Xpert Xpress SARS-CoV-2/FLU/RSV testing.  Fact Sheet for Patients: BloggerCourse.com  Fact Sheet for Healthcare Providers: SeriousBroker.it  This test is not yet approved or cleared by the [place] FDA and has been authorized for detection and/or diagnosis of SARS-CoV-2 by FDA under an Emergency Use Authorization (EUA). This EUA will remain in effect (meaning this test can be used) for the duration of the COVID-19 declaration under Section 564(b)(1) of the Act, 21  U.S.C. section 360bbb-3(b)(1), unless the authorization is terminated or revoked.  Performed at [facility], [address] [place], [place] [address]           Radiology Studies: No results found.      Scheduled Meds:  (feeding supplement) PROSource Plus  30 mL Oral BID BM   cholecalciferol  2,000 Units Oral Daily   feeding supplement  237 mL Oral Q24H   feeding supplement (GLUCERNA SHAKE)  237 mL Oral BID BM   fludrocortisone  0.1 mg Oral Daily   heparin  5,000 Units Subcutaneous Q8H   insulin aspart  0-15 Units Subcutaneous TID WC   insulin aspart  0-5 Units Subcutaneous QHS   insulin detemir  12 Units Subcutaneous BID   levothyroxine  100 mcg Oral Q0600   memantine  10 mg Oral Q12H   multivitamin with minerals  1 tablet Oral Daily   predniSONE  5 mg Oral q morning   QUEtiapine  25 mg Oral QHS   rosuvastatin  5 mg Oral QHS   tacrolimus  0.5 mg Oral Daily   Continuous Infusions:   LOS: 9 days    Time spent: 35 minutes    Ramiro Harvestaniel Muslima Toppins, MD [facility]   To contact the attending provider between 7A-7P or the covering provider during after hours 7P-7A, please log into the web site [email or web address] and access using universal [place] password for that web site. If you do not have the password, please call the hospital operator.  09/01/2020, 11:03 AM

## 2020-09-01 NOTE — Progress Notes (Signed)
Pt's blood sugar 301 with loaner glucometer.

## 2020-09-02 LAB — GLUCOSE, CAPILLARY
Glucose-Capillary: 360 mg/dL — ABNORMAL HIGH (ref 70–99)
Glucose-Capillary: 359 mg/dL — ABNORMAL HIGH (ref 70–99)
Glucose-Capillary: 301 mg/dL — ABNORMAL HIGH (ref 70–99)
Glucose-Capillary: 185 mg/dL — ABNORMAL HIGH (ref 70–99)
Glucose-Capillary: 303 mg/dL — ABNORMAL HIGH (ref 70–99)
Glucose-Capillary: 295 mg/dL — ABNORMAL HIGH (ref 70–99)

## 2020-09-02 LAB — BASIC METABOLIC PANEL
Anion gap: 9 (ref 5–15)
BUN: 18 mg/dL (ref 8–23)
CO2: 26 mmol/L (ref 22–32)
Calcium: 9.1 mg/dL (ref 8.9–10.3)
Chloride: 103 mmol/L (ref 98–111)
Creatinine, Ser: 0.68 mg/dL (ref 0.44–1.00)
GFR, Estimated: >60 mL/min (ref >60)
Glucose, Bld: 159 mg/dL — ABNORMAL HIGH (ref 70–99)
Potassium: 4 mmol/L (ref 3.5–5.1)
Sodium: 138 mmol/L (ref 135–145)

## 2020-09-02 LAB — MAGNESIUM: Magnesium: 2.3 mg/dL (ref 1.7–2.4)

## 2020-09-02 MED ORDER — GLUCERNA SHAKE PO LIQD: 237.0000 mL | Freq: Two times a day (BID) | ORAL | 0 refills | Status: DC

## 2020-09-02 MED ORDER — PROSOURCE PLUS PO LIQD: 30.0000 mL | Freq: Two times a day (BID) | ORAL | Status: DC

## 2020-09-02 MED ORDER — ENSURE SURGERY PO LIQD: 237.0000 mL | ORAL | Status: DC

## 2020-09-02 NOTE — NC FL2 (Signed)
[place] MEDICAID FL2 LEVEL OF CARE SCREENING TOOL     IDENTIFICATION  Patient Name: Emma Maldonado Birthdate: 04/25/1950 Sex: female Admission Date (Current Location): 08/22/2020  [facility] and [place] Number:  Producer, television/film/video and Address:  [facility],  [address] [place], [place] [address]      Provider Number: [id number]  Attending Physician Name and Address:  Rodolph Bong, MD  Relative Name and Phone Number:  Andalyn Heckstall (husband) Ph: [phone]    Current Level of Care: Hospital Recommended Level of Care: Assisted Living Facility, Memory Care Prior Approval Number:    Date Approved/Denied:   PASRR Number:    Discharge Plan: Other (Comment) [facility] memory care unit)    Current Diagnoses: Patient Active Problem List   Diagnosis Date Noted   Hypomagnesemia    Acute renal failure (HCC)    Hypotension 08/22/2020   Hydronephrosis of right kidney 05/01/2020   Rib fractures 05/01/2020   UTI (urinary tract infection) 03/25/2020   Diabetes mellitus type 2, uncomplicated (HCC) 03/25/2020   Hypokalemia 03/25/2020   Essential hypertension 03/25/2020   Dementia (HCC) 03/25/2020   Altered mental status 03/25/2020   Uncontrolled type 2 diabetes mellitus with hyperglycemia (HCC) 03/25/2020   Accelerated hypertension 03/28/2012   Viral gastroenteritis 03/28/2012   Sepsis (HCC) 03/26/2012   History of renal transplant 03/26/2012   Diabetes mellitus (HCC) 03/26/2012   Hyperlipidemia 03/26/2012    Orientation RESPIRATION BLADDER Height & Weight     Self  Normal Incontinent Weight: 152 lb 5.4 oz (69.1 kg) Height:  5\' 2"  (157.5 cm)  BEHAVIORAL SYMPTOMS/MOOD NEUROLOGICAL BOWEL NUTRITION STATUS      Incontinent Diet (Carb modified)  AMBULATORY STATUS COMMUNICATION OF NEEDS Skin   Limited Assist Verbally Other (Comment) (Ecchymosis: abdomen)                       Personal Care Assistance Level of Assistance  Bathing, Feeding,  Dressing Bathing Assistance: Limited assistance Feeding assistance: Independent Dressing Assistance: Limited assistance     Functional Limitations Info  Sight, Hearing, Speech Sight Info: Impaired Hearing Info: Impaired Speech Info: Adequate    SPECIAL CARE FACTORS FREQUENCY                       Contractures Contractures Info: Not present    Additional Factors Info  Code Status, Allergies, Psychotropic, Insulin Sliding Scale Code Status Info: Full Allergies Info: NKA Psychotropic Info: Seroquel (quetiapine) Insulin Sliding Scale Info: See discharge summary       Current Medications (09/02/2020):  This is the current hospital active medication list Current Facility-Administered Medications  Medication Dose Route Frequency Provider Last Rate Last Admin   (feeding supplement) PROSource Plus liquid 30 mL  30 mL Oral BID BM 09/04/2020, MD   30 mL at 09/02/20 1217   acetaminophen (TYLENOL) tablet 650 mg  650 mg Oral Q6H PRN Pahwani, Rinka R, MD       alum & mag hydroxide-simeth (MAALOX/MYLANTA) 200-200-20 MG/5ML suspension 30 mL  30 mL Oral Q6H PRN 07-21-2000, MD       cholecalciferol (VITAMIN D3) tablet 2,000 Units  2,000 Units Oral Daily Lonia Blood, MD   2,000 Units at 09/02/20 0951   feeding supplement (ENSURE SURGERY) liquid 237 mL  237 mL Oral Q24H 09/04/20, MD   237 mL at 09/01/20 1320   feeding supplement (GLUCERNA SHAKE) (GLUCERNA SHAKE) liquid 237 mL  237 mL Oral BID BM Rodolph Bong, MD   237 mL at 09/02/20 0951   fludrocortisone (FLORINEF) tablet 0.1 mg  0.1 mg Oral Daily Candelaria Stagers T, MD   0.1 mg at 09/02/20 0951   guaiFENesin (ROBITUSSIN) 100 MG/5ML solution 200 mg  200 mg Oral Q6H PRN Lonia Blood, MD       heparin injection 5,000 Units  5,000 Units Subcutaneous Q8H Pahwani, Rinka R, MD   5,000 Units at 09/02/20 1217   insulin aspart (novoLOG) injection 0-15 Units  0-15 Units Subcutaneous TID WC Candelaria Stagers T, MD   11  Units at 09/02/20 1217   insulin aspart (novoLOG) injection 0-5 Units  0-5 Units Subcutaneous QHS Candelaria Stagers T, MD   4 Units at 09/01/20 2022   insulin detemir (LEVEMIR) injection 14 Units  14 Units Subcutaneous BID Rodolph Bong, MD   14 Units at 09/02/20 0950   levothyroxine (SYNTHROID) tablet 100 mcg  100 mcg Oral Q0600 Jetty Duhamel T, MD   100 mcg at 09/02/20 0520   memantine (NAMENDA) tablet 10 mg  10 mg Oral Q12H Lonia Blood, MD   10 mg at 09/02/20 4481   multivitamin with minerals tablet 1 tablet  1 tablet Oral Daily Candelaria Stagers T, MD   1 tablet at 09/02/20 0952   ondansetron (ZOFRAN) tablet 4 mg  4 mg Oral Q6H PRN Pahwani, Rinka R, MD       Or   ondansetron (ZOFRAN) injection 4 mg  4 mg Intravenous Q6H PRN Pahwani, Rinka R, MD       predniSONE (DELTASONE) tablet 5 mg  5 mg Oral q morning Lonia Blood, MD   5 mg at 09/02/20 0954   QUEtiapine (SEROQUEL) tablet 25 mg  25 mg Oral QHS Lonia Blood, MD   25 mg at 09/01/20 2022   rosuvastatin (CRESTOR) tablet 5 mg  5 mg Oral QHS Lonia Blood, MD   5 mg at 09/01/20 2022   tacrolimus (PROGRAF) capsule 0.5 mg  0.5 mg Oral Daily Lonia Blood, MD   0.5 mg at 09/02/20 8563     Discharge Medications: Please see discharge summary for a list of discharge medications.  Medication List       STOP taking these medications     losartan 25 MG tablet Commonly known as: COZAAR    potassium chloride SA 20 MEQ tablet Commonly known as: KLOR-CON           TAKE these medications     acetaminophen 500 MG tablet Commonly known as: TYLENOL Take 500 mg by mouth every 6 (six) hours as needed for fever or headache.    alum & mag hydroxide-simeth 200-200-20 MG/5ML suspension Commonly known as: MAALOX/MYLANTA Take 30 mLs by mouth every 6 (six) hours as needed for indigestion or heartburn.    feeding supplement (GLUCERNA SHAKE) Liqd Take 237 mLs by mouth 3 (three) times daily between meals.    feeding  supplement (GLUCERNA SHAKE) Liqd Take 237 mLs by mouth 2 (two) times daily between meals.    (feeding supplement) PROSource Plus liquid Take 30 mLs by mouth 2 (two) times daily between meals.    feeding supplement Liqd Take 237 mLs by mouth daily. Start taking on: September 03, 2020    fludrocortisone 0.1 MG tablet Commonly known as: FLORINEF Take 0.1 mg by mouth at bedtime.    guaifenesin 100 MG/5ML syrup Commonly known as: ROBITUSSIN Take 200 mg by mouth every  6 (six) hours as needed for cough.    insulin aspart 100 UNIT/ML injection Commonly known as: novoLOG Inject 10 Units into the skin See admin instructions. Inject 10 units into the skin three times a day after meals and hold if not eating    Lantus SoloStar 100 UNIT/ML Solostar Pen Generic drug: insulin glargine Inject 20 Units into the skin in the morning. What changed: how much to take    levothyroxine 100 MCG tablet Commonly known as: SYNTHROID Take 100 mcg by mouth daily before breakfast.    loperamide 2 MG capsule Commonly known as: IMODIUM Take 2 mg by mouth as needed (with each loose stool for diarrhea- cannot exceed 8 doses a day).    magnesium hydroxide 400 MG/5ML suspension Commonly known as: MILK OF MAGNESIA Take 30 mLs by mouth at bedtime as needed for mild constipation.    memantine 10 MG tablet Commonly known as: NAMENDA Take 10 mg by mouth every 12 (twelve) hours.    multivitamin with minerals Tabs tablet Take 1 tablet by mouth daily.    neomycin-bacitracin-polymyxin [phone] ointment Apply 1 application topically as needed (for minor skin tears or abrasions, after cleaning with normal saline- bandage with gauze and tape).    predniSONE 5 MG tablet Commonly known as: DELTASONE Take 5 mg by mouth every morning.    QUEtiapine 25 MG tablet Commonly known as: SEROQUEL Take 25 mg by mouth at bedtime.    rosuvastatin 5 MG tablet Commonly known as: CRESTOR Take 5 mg by mouth at bedtime.     tacrolimus 0.5 MG capsule Commonly known as: PROGRAF Take 1 capsule (0.5 mg total) by mouth daily.    Vitamin D3 50 MCG (2000 UT) Tabs Take 2,000 Units by mouth daily.    Relevant Imaging Results:  Relevant Lab Results:   Additional Information SSN: [id number]  Ewing Schlein, LCSW

## 2020-09-02 NOTE — Progress Notes (Signed)
Physical Therapy Treatment Patient Details Name: Emma Maldonado MRN: [id number] DOB: 1950/04/21 Today's Date: 09/02/2020    History of Present Illness 70 y.o. female brought by EMS from [facility] house for evaluation of low blood pressure on 08/22/20. Admitted with hypotension and hypoglycemia.  Pt also found to be COVID +. Pt with medical history significant of hypertension, dementia, history of renal transplant on immunosuppressant followed by her nephrologist at [facility]    PT Comments    Pt ambulates increased distance with RW, step through pattern, no LOB and min guard for safety. Pt minimally conversational, denies pain or fatigue, no dyspnea noted. Pt returned to supine at EOS, only cued to scoot up in bed.   Follow Up Recommendations  No PT follow up;Supervision for mobility/OOB (return to memory care unit)     Equipment Recommendations  None recommended by PT    Recommendations for Other Services       Precautions / Restrictions Precautions Precautions: Fall Restrictions Weight Bearing Restrictions: No    Mobility  Bed Mobility Overal bed mobility: Modified Independent  General bed mobility comments: comes to sitting and returns to supine without assistance, VCs to scoot up in bed due to being far down when returning to supine    Transfers Overall transfer level: Needs assistance Equipment used: Rolling walker (2 wheeled) Transfers: Sit to/from Stand Sit to Stand: Supervision  General transfer comment: VC for hand placement, good steadiness upon rising  Ambulation/Gait Ambulation/Gait assistance: Min guard Gait Distance (Feet): 100 Feet Assistive device: Rolling walker (2 wheeled) Gait Pattern/deviations: Step-through pattern;Decreased stride length Gait velocity: decreased   General Gait Details: pt ambulates in hallway since off covid precautions, step through pattern with RW, VCs to maneuver RW away from wall/object, no LOB, no DOE   Metallurgist Rankin (Stroke Patients Only)       Balance Overall balance assessment: History of Falls;Needs assistance Sitting-balance support: No upper extremity supported Sitting balance-Leahy Scale: Good     Standing balance support: During functional activity Standing balance-Leahy Scale: Fair Standing balance comment: static no support, dynamic with RW     Cognition Arousal/Alertness: Awake/alert Behavior During Therapy: WFL for tasks assessed/performed Overall Cognitive Status: History of cognitive impairments - at baseline  General Comments: hx dementia, able to communicate with simple brief commands/sentences      Exercises      General Comments        Pertinent Vitals/Pain Pain Assessment: No/denies pain    Home Living                      Prior Function            PT Goals (current goals can now be found in the care plan section) Acute Rehab PT Goals Patient Stated Goal: none stated PT Goal Formulation: With patient Time For Goal Achievement: 09/06/20 Potential to Achieve Goals: Good Progress towards PT goals: Progressing toward goals    Frequency    Min 2X/week      PT Plan Current plan remains appropriate    Co-evaluation              AM-PAC PT "6 Clicks" Mobility   Outcome Measure  Help needed turning from your back to your side while in a flat bed without using bedrails?: None Help needed moving from lying on your back to sitting on the side  of a flat bed without using bedrails?: None Help needed moving to and from a bed to a chair (including a wheelchair)?: A Little Help needed standing up from a chair using your arms (e.g., wheelchair or bedside chair)?: A Little Help needed to walk in hospital room?: A Little Help needed climbing 3-5 steps with a railing? : A Little 6 Click Score: 20    End of Session Equipment Utilized During Treatment: Gait belt Activity Tolerance: Patient  tolerated treatment well Patient left: in bed;with call bell/phone within reach;with bed alarm set Nurse Communication: Mobility status PT Visit Diagnosis: Difficulty in walking, not elsewhere classified (R26.2)     Time: 1517-6160 PT Time Calculation (min) (ACUTE ONLY): 12 min  Charges:  $Gait Training: 8-22 mins                      Tori Taquana Bartley PT, DPT 09/02/20, 12:47 PM

## 2020-09-02 NOTE — Care Management Important Message (Signed)
Important Message  Patient Details IM Letter given to the Patient. Name: Dashayla Theissen MRN: [id number] Date of Birth: [birth year]   Medicare Important Message Given:  Yes     [name] 09/02/2020, 1:22 PM

## 2020-09-02 NOTE — Discharge Summary (Signed)
Physician Discharge Summary  Emma Maldonado Emma Maldonado:[id number]:[id number] DOB: 03/02/1950 DOA: 08/22/2020  PCP: Patient, No Pcp Per (Inactive)  Admit date: 08/22/2020 Discharge date: 09/02/2020  Time spent: 55 minutes  Recommendations for Outpatient Follow-up:  Follow-up with MD at skilled nursing facility.  Patient will need basic metabolic profile, magnesium level checked in 1 week to follow-up on electrolytes and renal function. Follow-up with primary nephrologist as previously scheduled.   Discharge Diagnoses:  Principal Problem:   Hypotension Active Problems:   Diabetes mellitus (HCC)   Hyperlipidemia   Essential hypertension   Dementia (HCC)   Acute renal failure (HCC)   Hypomagnesemia   Discharge Condition: Stable and improved.  Diet recommendation: Carb modified diet  Filed Weights   08/29/20 0500 09/01/20 0500 09/02/20 0500  Weight: 67.1 kg 69.1 kg 69.1 kg    History of present illness:  HPI per Dr. Celestia KhatPahwani  Emma Maldonado is a 70 y.o. 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  She is poor historian.  History gathered from ER provider.   Apparently EMS was called and noted that patient has low blood pressure of 60/40. BS: 51, IVF given and brought patient to ED for further evaluation. No loss of consciousness, seizure, fall, fever, chills.   She said she is not feeling well, has some headache and feels like she has flu.   Of note: She presented to ED on 8/6 with similar symptoms-work-up was negative except hypokalemia which was replaced and she discharged to SNF  in stable condition. AS per daughter patient had covid infection 2 weeks ago. She is fully vaccinated and boosted.   ED Course: Upon arrival to ED: Patient's blood pressure normalized to slightly elevated.  Afebrile, no  leukocytosis.  Troponin, lactic acid: WNL, UA negative for infection.  CMP show AKI.  Chest x-ray negative for acute findings.  Renal ultrasound ordered and is pending.  [facility] hospitalist consulted for admission for overnight observation due to worsening renal function.   Hospital Course:  1 hypotension -Patient presented with hypotension history of autonomic instability and adrenal insufficiency on Florinef prior to admission. -Urine cultures with Aerococcus species likely bacteriuria. -Patient remained afebrile.  WBC normal.   -Patient hydrated with IV fluids.   -Hypotension resolved with hydration and Florinef.   -Losartan held and will not resume on discharge.    2.  Hypertension -Patient on presentation noted to be hypotensive which has improved. -Patient resumed back on home regimen of Florinef.   -Patient's losartan was discontinued during the hospitalization. -We will likely avoid losartan if patient truly has a mineralocorticoid deficiency and will not resume on discharge.  3.  Acute kidney injury of transplanted kidney -Resolved.   -Patient maintained on home regimen of Prograf, prednisone.   -Outpatient follow-up with primary nephrologist as previously scheduled  4. hypomagnesemia/hypokalemia -Repleted during the hospitalization.   -Outpatient follow-up.  5.  COVID-19 infection -Asymptomatic, not hypoxic. -No indication for treatment at this time. -On chronic steroids. -Patient maintained on isolation precautions for total of 10 days.  -Isolation precautions were discontinued by day of discharge.   6.  Type 2 diabetes mellitus with hyperglycemia and hyperlipidemia -Hemoglobin A1c 7.3 (08/22/2020). -Patient noted to be on chronic steroids.   -During the hospitalization patient's oral intake was fluctuating and as such meal coverage NovoLog was held and patient maintained on sliding  scale insulin as well as long-acting insulin of Levemir.  -Insulin doses adjusted.    -Outpatient follow-up.  7.  Hypothyroidism -Patient maintained on Synthroid.   8.  Severe dementia without behavioral disturbance -Patient oriented to self and place. -Following commands appropriately. -Patient maintained on home regimen of Seroquel, Namenda. -Frequent reorientation, delirium precautions were done during the hospitalization..  9.  Depression -Remained stable on home regimen Zoloft  10.  Thrombocytopenia -Felt likely secondary to lab error or platelet clumping.  Resolved.  11.  Hyperlipidemia -Patient maintained on statin.  Outpatient follow-up      Procedures: Chest x-ray 08/22/2020 Renal ultrasound 08/22/2020  Consultations: None  Discharge Exam: Vitals:   09/01/20 1949 09/02/20 0519  BP: (!) 151/70 116/66  Pulse: 76 68  Resp: 16 16  Temp: 98.8 F (37.1 C) 98.7 F (37.1 C)  SpO2: 96% 96%    General: NAD Cardiovascular: RRR no murmurs rubs or gallops.  No JVD.  No lower extremity edema. Respiratory: Clear to auscultation bilaterally.  No wheezes, no crackles, no rhonchi.  Normal respiratory effort.  Discharge Instructions   Discharge Instructions     Diet Carb Modified   Complete by: As directed    Increase activity slowly   Complete by: As directed       Allergies as of 09/02/2020   No Known Allergies      Medication List     STOP taking these medications    losartan 25 MG tablet Commonly known as: COZAAR   potassium chloride SA 20 MEQ tablet Commonly known as: KLOR-CON       TAKE these medications    acetaminophen 500 MG tablet Commonly known as: TYLENOL Take 500 mg by mouth every 6 (six) hours as needed for fever or headache.   alum & mag hydroxide-simeth 200-200-20 MG/5ML suspension Commonly known as: MAALOX/MYLANTA Take 30 mLs by mouth every 6 (six) hours as needed for indigestion or heartburn.   feeding supplement (GLUCERNA SHAKE) Liqd Take 237 mLs by mouth 3 (three) times daily between meals.   feeding  supplement (GLUCERNA SHAKE) Liqd Take 237 mLs by mouth 2 (two) times daily between meals.   (feeding supplement) PROSource Plus liquid Take 30 mLs by mouth 2 (two) times daily between meals.   feeding supplement Liqd Take 237 mLs by mouth daily. Start taking on: September 03, 2020   fludrocortisone 0.1 MG tablet Commonly known as: FLORINEF Take 0.1 mg by mouth at bedtime.   guaifenesin 100 MG/5ML syrup Commonly known as: ROBITUSSIN Take 200 mg by mouth every 6 (six) hours as needed for cough.   insulin aspart 100 UNIT/ML injection Commonly known as: novoLOG Inject 10 Units into the skin See admin instructions. Inject 10 units into the skin three times a day after meals and hold if not eating   Lantus SoloStar 100 UNIT/ML Solostar Pen Generic drug: insulin glargine Inject 20 Units into the skin in the morning. What changed: how much to take   levothyroxine 100 MCG tablet Commonly known as: SYNTHROID Take 100 mcg by mouth daily before breakfast.   loperamide 2 MG capsule Commonly known as: IMODIUM Take 2 mg by mouth as needed (with each loose stool for diarrhea- cannot exceed 8 doses a day).   magnesium hydroxide 400 MG/5ML suspension Commonly known as: MILK OF MAGNESIA Take 30 mLs by mouth at bedtime as needed for mild constipation.   memantine 10 MG tablet Commonly known as: NAMENDA Take 10 mg by mouth every 12 (twelve) hours.  multivitamin with minerals Tabs tablet Take 1 tablet by mouth daily.   neomycin-bacitracin-polymyxin [phone] ointment Apply 1 application topically as needed (for minor skin tears or abrasions, after cleaning with normal saline- bandage with gauze and tape).   predniSONE 5 MG tablet Commonly known as: DELTASONE Take 5 mg by mouth every morning.   QUEtiapine 25 MG tablet Commonly known as: SEROQUEL Take 25 mg by mouth at bedtime.   rosuvastatin 5 MG tablet Commonly known as: CRESTOR Take 5 mg by mouth at bedtime.   tacrolimus 0.5 MG  capsule Commonly known as: PROGRAF Take 1 capsule (0.5 mg total) by mouth daily.   Vitamin D3 50 MCG (2000 UT) Tabs Take 2,000 Units by mouth daily.       No Known Allergies  Follow-up Information     MD AT SNF Follow up.          Nephrologist Follow up.   Why: Follow-up as previously scheduled.                 The results of significant diagnostics from this hospitalization (including imaging, microbiology, ancillary and laboratory) are listed below for reference.    Significant Diagnostic Studies: US Renal  Result Date: 08/22/2020 CLINICAL DATA:  Renal failure EXAM: RENAL / URINARY TRACT ULTRASOUND COMPLETE COMPARISON:  05/05/2020, CT 05/01/2020 FINDINGS: Native right Kidney: Renal measurements: 8.8 x 3.5 x 3.7 cm = volume: 59.2 mL. Atrophic and diffusely echogenic without hydronephrosis Native left Kidney: Renal measurements: 9.6 x 4 x 3.1 cm = volume: 61.9 mL. Atrophic and diffusely echogenic without hydronephrosis. Right lower quadrant transplant kidney measures 12.3 x 4.4 x 5.6 cm with volume of 158.9 cc. No hydronephrosis. Small cyst at the midpole measuring 1.3 cm. Bladder: Appears normal for degree of bladder distention. Other: Prevoid bladder volume 554 mL. IMPRESSION: 1. Right lower quadrant transplant kidney with simple cyst but otherwise normal in ultrasound appearance. No hydronephrosis 2. Echogenic atrophic native kidneys. Electronically Signed   By: Jasmine Pang M.D.   On: 08/22/2020 17:09   DG Chest Port 1 View  Result Date: 08/22/2020 CLINICAL DATA:  Shortness of breath. EXAM: PORTABLE CHEST 1 VIEW COMPARISON:  08/17/2020 FINDINGS: The cardiac silhouette, mediastinal and hilar contours are within normal limits given the AP projection and portable technique. The lungs are clear of an acute process. No pleural effusions. No pulmonary lesions. The bony thorax is intact. IMPRESSION: No acute cardiopulmonary findings. Electronically Signed   By: Rudie Meyer  M.D.   On: 08/22/2020 11:46   DG Chest Port 1 View  Result Date: 08/17/2020 CLINICAL DATA:  Questionable sepsis, evaluate for abnormality. EXAM: PORTABLE CHEST 1 VIEW COMPARISON:  03/24/2020 FINDINGS: Single view of the chest was obtained. Lungs are clear without focal airspace disease. Heart size is prominent but likely accentuated by the projection and AP technique. Trachea is midline. Negative for a pneumothorax. No acute bone abnormality. IMPRESSION: No active disease. Electronically Signed   By: Richarda Overlie M.D.   On: 08/17/2020 09:43    Microbiology: No results found for this or any previous visit (from the past 240 hour(s)).   Labs: Basic Metabolic Panel: Recent Labs  Lab 08/27/20 0943 08/28/20 0848 08/29/20 0420 09/01/20 0821 09/02/20 0318  NA 135 138 139 141 138  K 5.0 5.1 4.4 4.0 4.0  CL 101 105 105 108 103  CO2 25 23 26 25 26   GLUCOSE 282* 173* 156* 218* 159*  BUN 21 23 22 19 18   CREATININE 0.85 0.88  0.69 0.73 0.68  CALCIUM 9.8 9.4 9.8 9.5 9.1  MG 1.4* 1.6* 2.5* 1.7 2.3  PHOS 4.5 4.2  --   --   --    Liver Function Tests: Recent Labs  Lab 08/27/20 0943 08/28/20 0848  ALBUMIN 3.3* 3.4*   No results for input(s): LIPASE, AMYLASE in the last 168 hours. No results for input(s): AMMONIA in the last 168 hours. CBC: No results for input(s): WBC, NEUTROABS, HGB, HCT, MCV, PLT in the last 168 hours. Cardiac Enzymes: No results for input(s): CKTOTAL, CKMB, CKMBINDEX, TROPONINI in the last 168 hours. BNP: BNP (last 3 results) Recent Labs    03/27/20 0149 03/28/20 0241 03/29/20 0210  BNP 174.8* 124.5* 110.3*    ProBNP (last 3 results) No results for input(s): PROBNP in the last 8760 hours.  CBG: Recent Labs  Lab 09/01/20 1149 09/01/20 1634 09/01/20 1945 09/02/20 0808 09/02/20 1110  GLUCAP 360* 359* 301* 185* 303*       Signed:  Ramiro Harvest MD.  [facility] 09/02/2020, 12:37 PM

## 2020-09-02 NOTE — TOC Progression Note (Signed)
Transition of Care [facility]) - Progression Note    Patient Details  Name: Emma Maldonado MRN: [id number] Date of Birth: Jan 24, 1950  Transition of Care [facility] CM/SW Contact  Geni Bers, RN Phone Number: 09/02/2020, 10:29 AM  Clinical Narrative:    A call to [facility] revealed:facility will need FL2 and discharge summary before accepting pt back.    Expected Discharge Plan: Skilled Nursing Facility Barriers to Discharge: Continued Medical Work up  Expected Discharge Plan and Services Expected Discharge Plan: Skilled Nursing Facility       Living arrangements for the past 2 months: Assisted Living Facility                                       Social Determinants of Health (SDOH) Interventions    Readmission Risk Interventions No flowsheet data found.

## 2020-09-02 NOTE — Progress Notes (Signed)
Results for SHANEY, DECKMAN (MRN [id number]) as of 09/02/2020 12:33  Ref. Range 09/01/2020 11:49 09/01/2020 16:34 09/01/2020 19:45 09/02/2020 08:08 09/02/2020 11:10  Glucose-Capillary Latest Ref Range: 70 - 99 mg/dL 360 (H) 677 (H) 034 (H) 185 (H) 303 (H)  Noted that postprandial CBGs are greater than 180 mg/dl.   Recommend adding Novolog 6 units TID with meals if patient eats at least 50% of meal and postprandial CBGs continue to be elevated.  Smith Mince RN BSN CDE Diabetes Coordinator Pager: [phone]  8am-5pm

## 2020-09-02 NOTE — Plan of Care (Signed)
Pt to be transferred to [facility] house memory care unit via PTAR. [facility] memory care unit called for report. Pt safety maintained. AVS in discharge packet.

## 2020-09-02 NOTE — TOC Transition Note (Addendum)
Transition of Care [facility]) - CM/SW Discharge Note  Patient Details  Name: Azhar Yogi MRN: [id number] Date of Birth: [birth year]  Transition of Care [facility]) CM/SW Contact:  Ewing Schlein, LCSW Phone Number: 09/02/2020, 2:10 PM  Clinical Narrative: Patient is now 11 days post-positive COVID test. Patient to return to [facility] memory care unit today. CSW faxed FL2 and discharge summary to the facility per their request [phone]). VM left for the memory care unit regarding the faxed documentation and the patient being ready for discharge.  Medical necessity form done; PTAR scheduled. Discharge packet done. CSW notified husband and daughter of discharge. RN updated. TOC signing off.  Final next level of care: Memory Care Barriers to Discharge: Barriers Resolved  Patient Goals and CMS Choice Patient states their goals for this hospitalization and ongoing recovery are:: To return back to [facility] ALF. CMS Medicare.gov Compare Post Acute Care list provided to:: Patient Represenative (must comment) Choice offered to / list presented to : Spouse, Adult Children  Discharge Placement       Patient chooses bed at: [facility] (Memory care unit) Patient to be transferred to facility by: PTAR Name of family member notified: Cardelia Sassano (husband), [name] (daughter) Patient and family notified of of transfer: 09/02/20  Discharge Plan and Services         DME Arranged: N/A DME Agency: NA  Readmission Risk Interventions No flowsheet data found.

## 2020-09-09 ENCOUNTER — Emergency Department (HOSPITAL_COMMUNITY)
Admission: EM | Admit: 2020-09-09 | Discharge: 2020-09-09 | Disposition: A | Discharge status: Home / Self Care | Payer: Medicare Other | Attending: Emergency Medicine | Admitting: Emergency Medicine

## 2020-09-09 ENCOUNTER — Emergency Department (HOSPITAL_COMMUNITY): Payer: Medicare Other

## 2020-09-09 ENCOUNTER — Encounter (HOSPITAL_COMMUNITY): Payer: Self-pay

## 2020-09-09 DIAGNOSIS — E119 Type 2 diabetes mellitus without complications: Secondary | ICD-10-CM | POA: Insufficient documentation

## 2020-09-09 DIAGNOSIS — S32028A Other fracture of second lumbar vertebra, initial encounter for closed fracture: Secondary | ICD-10-CM | POA: Diagnosis not present

## 2020-09-09 DIAGNOSIS — S2241XA Multiple fractures of ribs, right side, initial encounter for closed fracture: Secondary | ICD-10-CM | POA: Diagnosis not present

## 2020-09-09 DIAGNOSIS — I10 Essential (primary) hypertension: Secondary | ICD-10-CM | POA: Diagnosis not present

## 2020-09-09 DIAGNOSIS — F0390 Unspecified dementia without behavioral disturbance: Secondary | ICD-10-CM | POA: Diagnosis not present

## 2020-09-09 DIAGNOSIS — W19XXXA Unspecified fall, initial encounter: Secondary | ICD-10-CM | POA: Insufficient documentation

## 2020-09-09 DIAGNOSIS — S301XXA Contusion of abdominal wall, initial encounter: Secondary | ICD-10-CM | POA: Diagnosis not present

## 2020-09-09 DIAGNOSIS — Z794 Long term (current) use of insulin: Secondary | ICD-10-CM | POA: Diagnosis not present

## 2020-09-09 DIAGNOSIS — S42101A Fracture of unspecified part of scapula, right shoulder, initial encounter for closed fracture: Secondary | ICD-10-CM

## 2020-09-09 DIAGNOSIS — S4991XA Unspecified injury of right shoulder and upper arm, initial encounter: Secondary | ICD-10-CM | POA: Diagnosis present

## 2020-09-09 DIAGNOSIS — R918 Other nonspecific abnormal finding of lung field: Secondary | ICD-10-CM

## 2020-09-09 DIAGNOSIS — S42121A Displaced fracture of acromial process, right shoulder, initial encounter for closed fracture: Secondary | ICD-10-CM | POA: Insufficient documentation

## 2020-09-09 DIAGNOSIS — S32018A Other fracture of first lumbar vertebra, initial encounter for closed fracture: Secondary | ICD-10-CM | POA: Diagnosis not present

## 2020-09-09 DIAGNOSIS — S22009A Unspecified fracture of unspecified thoracic vertebra, initial encounter for closed fracture: Secondary | ICD-10-CM

## 2020-09-09 HISTORY — DX: Disorder of kidney and ureter, unspecified: N28.9

## 2020-09-09 LAB — BASIC METABOLIC PANEL
Anion gap: 9 (ref 5–15)
BUN: 15 mg/dL (ref 8–23)
CO2: 23 mmol/L (ref 22–32)
Calcium: 9.7 mg/dL (ref 8.9–10.3)
Chloride: 109 mmol/L (ref 98–111)
Creatinine, Ser: 0.74 mg/dL (ref 0.44–1.00)
GFR, Estimated: >60 mL/min (ref >60)
Glucose, Bld: 228 mg/dL — ABNORMAL HIGH (ref 70–99)
Potassium: 4.7 mmol/L (ref 3.5–5.1)
Sodium: 141 mmol/L (ref 135–145)

## 2020-09-09 LAB — CBC WITH DIFFERENTIAL/PLATELET
Abs Immature Granulocytes: 0.06 10*3/uL (ref 0.00–0.07)
Basophils Absolute: 0.1 10*3/uL (ref 0.0–0.1)
Basophils Relative: 1 %
Eosinophils Absolute: 0.1 10*3/uL (ref 0.0–0.5)
Eosinophils Relative: 1 %
HCT: 43.1 % (ref 36.0–46.0)
Hemoglobin: 13.8 g/dL (ref 12.0–15.0)
Immature Granulocytes: 1 %
Lymphocytes Relative: 21 %
Lymphs Abs: 2 10*3/uL (ref 0.7–4.0)
MCH: 25.5 pg — ABNORMAL LOW (ref 26.0–34.0)
MCHC: 32 g/dL (ref 30.0–36.0)
MCV: 79.7 fL — ABNORMAL LOW (ref 80.0–100.0)
Monocytes Absolute: 0.5 10*3/uL (ref 0.1–1.0)
Monocytes Relative: 6 %
Neutro Abs: 6.7 10*3/uL (ref 1.7–7.7)
Neutrophils Relative %: 70 %
Platelets: 169 10*3/uL (ref 150–400)
RBC: 5.41 MIL/uL — ABNORMAL HIGH (ref 3.87–5.11)
RDW: 16.4 % — ABNORMAL HIGH (ref 11.5–15.5)
WBC: 9.3 10*3/uL (ref 4.0–10.5)
nRBC: 0 % (ref 0.0–0.2)

## 2020-09-09 MED ORDER — IOHEXOL 350 MG/ML SOLN
80.0000 mL | Freq: Once | INTRAVENOUS | Status: AC | PRN
  Administered 2020-09-09: 80 mL via INTRAVENOUS

## 2020-09-09 NOTE — ED Notes (Signed)
Pt in imaging at this time.

## 2020-09-09 NOTE — ED Notes (Signed)
Pt re-adjusted in ED stretcher. VSS.

## 2020-09-09 NOTE — Discharge Instructions (Addendum)
Nodules were seen on your CAT scan today that we will require a noncontrast chest CT in 6 months to make sure that it is not cancer

## 2020-09-09 NOTE — ED Triage Notes (Signed)
Pt presents via EMS from [facility] following a fall. Per EMS, staff states the pt is c/o right shoulder pain and bruising, however, per EMS, staff states they are unsure how this occurred or when. EMS states they are unsure if the pt fell. On arrival to the ED pt has bruising of the lower abdomen. Pt has a hx of dementia and is oriented to person alone. Per EMS, pt is normally ambulatory and stood and ambulated to EMS strecther with one-person stand-by assist. Per EMS, pt is a known diabetic with CBG 203 en route. VS en route: BP 116/66, HR 74, RR 17, 98% SPO2 on room air.

## 2020-09-09 NOTE — ED Provider Notes (Signed)
[facility]EMERGENCY DEPT Provider Note   CSN: [id number] Arrival date & time: 09/09/20  2376     History Chief Complaint  Patient presents with   Emma Maldonado is a 70 y.o. female.  70 year old female with history dementia presents from nursing home with right shoulder bruising as well as right side abdominal wall bruising.  Patient does not take any blood thinners.  Patient was noted to complain of having pain to her right shoulder.  No deformity was appreciated.  No reported history of falls.  History is limited due to her dementia      Past Medical History:  Diagnosis Date   Dementia (HCC)    Diabetes mellitus without complication (HCC)    Hypertension    Renal disorder     Patient Active Problem List   Diagnosis Date Noted   Hypomagnesemia    Acute renal failure (HCC)    Hypotension 08/22/2020   Hydronephrosis of right kidney 05/01/2020   Rib fractures 05/01/2020   UTI (urinary tract infection) 03/25/2020   Diabetes mellitus type 2, uncomplicated (HCC) 03/25/2020   Hypokalemia 03/25/2020   Essential hypertension 03/25/2020   Dementia (HCC) 03/25/2020   Altered mental status 03/25/2020   Uncontrolled type 2 diabetes mellitus with hyperglycemia (HCC) 03/25/2020   Accelerated hypertension 03/28/2012   Viral gastroenteritis 03/28/2012   Sepsis (HCC) 03/26/2012   History of renal transplant 03/26/2012   Diabetes mellitus (HCC) 03/26/2012   Hyperlipidemia 03/26/2012    Past Surgical History:  Procedure Laterality Date   ABDOMINAL HYSTERECTOMY     CHOLECYSTECTOMY     NEPHRECTOMY TRANSPLANTED ORGAN       OB History   No obstetric history on file.     Family History  Problem Relation Age of Onset   Diabetes Mellitus II Father     Social History   Tobacco Use   Smoking status: Never   Smokeless tobacco: Never  Substance Use Topics   Alcohol use: No   Drug use: No    Home Medications Prior to Admission  medications   Medication Sig Start Date End Date Taking? Authorizing Provider  acetaminophen (TYLENOL) 500 MG tablet Take 500 mg by mouth every 6 (six) hours as needed for fever or headache.    [provider]  alum & mag hydroxide-simeth (MAALOX/MYLANTA) 200-200-20 MG/5ML suspension Take 30 mLs by mouth every 6 (six) hours as needed for indigestion or heartburn.    [provider]  Cholecalciferol (VITAMIN D3) 50 MCG (2000 UT) TABS Take 2,000 Units by mouth daily.    [provider]  feeding supplement (ENSURE SURGERY) LIQD Take 237 mLs by mouth daily. 09/03/20   Rodolph Bong, MD  feeding supplement, GLUCERNA SHAKE, (GLUCERNA SHAKE) LIQD Take 237 mLs by mouth 3 (three) times daily between meals. 08/26/20   [name], MD  feeding supplement, GLUCERNA SHAKE, (GLUCERNA SHAKE) LIQD Take 237 mLs by mouth 2 (two) times daily between meals. 09/02/20   Rodolph Bong, MD  fludrocortisone (FLORINEF) 0.1 MG tablet Take 0.1 mg by mouth at bedtime.     [provider]  guaifenesin (ROBITUSSIN) 100 MG/5ML syrup Take 200 mg by mouth every 6 (six) hours as needed for cough.    [provider]  insulin aspart (NOVOLOG) 100 UNIT/ML injection Inject 10 Units into the skin See admin instructions. Inject 10 units into the skin three times a day after meals and hold if not eating  [provider]  LANTUS SOLOSTAR 100 UNIT/ML Solostar Pen Inject 20 Units into the skin in the morning. 08/26/20   [name], MD  levothyroxine (SYNTHROID) 100 MCG tablet Take 100 mcg by mouth daily before breakfast. 03/04/20   [provider]  loperamide (IMODIUM) 2 MG capsule Take 2 mg by mouth as needed (with each loose stool for diarrhea- cannot exceed 8 doses a day). 02/20/20   [provider]  magnesium hydroxide (MILK OF MAGNESIA) 400 MG/5ML suspension Take 30 mLs by mouth at bedtime as needed for mild constipation.    [provider]  memantine  (NAMENDA) 10 MG tablet Take 10 mg by mouth every 12 (twelve) hours.    [provider]  Multiple Vitamin (MULTIVITAMIN WITH MINERALS) TABS tablet Take 1 tablet by mouth daily. 08/26/20   [name], MD  neomycin-bacitracin-polymyxin (NEOSPORIN) [phone] ointment Apply 1 application topically as needed (for minor skin tears or abrasions, after cleaning with normal saline- bandage with gauze and tape).    [provider]  Nutritional Supplements (,FEEDING SUPPLEMENT, PROSOURCE PLUS) liquid Take 30 mLs by mouth 2 (two) times daily between meals. 09/02/20   Rodolph Bong, MD  predniSONE (DELTASONE) 5 MG tablet Take 5 mg by mouth every morning.    [provider]  QUEtiapine (SEROQUEL) 25 MG tablet Take 25 mg by mouth at bedtime.    [provider]  rosuvastatin (CRESTOR) 5 MG tablet Take 5 mg by mouth at bedtime. 12/01/19   [provider]  tacrolimus (PROGRAF) 0.5 MG capsule Take 1 capsule (0.5 mg total) by mouth daily. 03/28/12   Dhungel, Theda Belfast, MD    Allergies    Patient has no known allergies.  Review of Systems   Review of Systems  Unable to perform ROS: Dementia   Physical Exam Updated Vital Signs BP (!) 168/76 (BP Location: Left Arm)   Pulse 78   Temp 98 F (36.7 C) (Oral)   Resp 18   SpO2 100%   Physical Exam Vitals and nursing note reviewed.  Constitutional:      General: She is not in acute distress.    Appearance: Normal appearance. She is well-developed. She is not toxic-appearing.  HENT:     Head: Normocephalic and atraumatic.  Eyes:     General: Lids are normal.     Conjunctiva/sclera: Conjunctivae normal.     Pupils: Pupils are equal, round, and reactive to light.  Neck:     Thyroid: No thyroid mass.     Trachea: No tracheal deviation.  Cardiovascular:     Rate and Rhythm: Normal rate and regular rhythm.     Heart sounds: Normal heart sounds. No murmur heard.   No gallop.  Pulmonary:     Effort: Pulmonary  effort is normal. No respiratory distress.     Breath sounds: Normal breath sounds. No stridor. No decreased breath sounds, wheezing, rhonchi or rales.  Abdominal:     General: There is no distension.     Palpations: Abdomen is soft.     Tenderness: There is no abdominal tenderness. There is no rebound.    Musculoskeletal:        General: No tenderness. Normal range of motion.     Right shoulder: Normal range of motion.       Arms:     Cervical back: Normal range of motion and neck supple.  Skin:    General: Skin is warm and dry.     Findings:  No abrasion or rash.  Neurological:     Mental Status: She is alert. Mental status is at baseline.     GCS: GCS eye subscore is 4. GCS verbal subscore is 5. GCS motor subscore is 6.     Cranial Nerves: Cranial nerves are intact. No cranial nerve deficit.     Sensory: No sensory deficit.     Motor: Motor function is intact.  Psychiatric:        Attention and Perception: Attention normal.        Speech: Speech normal.        Behavior: Behavior normal.    ED Results / Procedures / Treatments   Labs (all labs ordered are listed, but only abnormal results are displayed) Labs Reviewed  CBC WITH DIFFERENTIAL/PLATELET  BASIC METABOLIC PANEL    EKG None  Radiology No results found.  Procedures Procedures   Medications Ordered in ED Medications - No data to display  ED Course  I have reviewed the triage vital signs and the nursing notes.  Pertinent labs & imaging results that were available during my care of the patient were reviewed by me and considered in my medical decision making (see chart for details).    MDM Rules/Calculators/A&P                            Final Clinical Impression(s) / ED Diagnoses Final diagnoses:  None  Patient had a right scapular fracture which was displaced identified on plain films.  Subsequently had a CT of chest which showed multiple fractures.  Patient's pain appears to be well controlled at  this time. sling will be applied due to patient scapular fracture.   Chest CT results noted and will be placed in patient's discharge instructions with instructions for follow-up. Will discharge back to facility Rx / DC Orders ED Discharge Orders     None        Lorre Nick, MD 09/09/20 1147

## 2020-12-23 ENCOUNTER — Emergency Department (HOSPITAL_COMMUNITY): Payer: Medicare Other

## 2020-12-23 ENCOUNTER — Encounter (HOSPITAL_COMMUNITY): Payer: Self-pay

## 2020-12-23 ENCOUNTER — Inpatient Hospital Stay (HOSPITAL_COMMUNITY)
Admission: EM | Admit: 2020-12-23 | Discharge: 2020-12-30 | DRG: 871 | Disposition: A | Discharge status: Skilled Nursing Facility | Payer: Medicare Other | Source: Skilled Nursing Facility | Attending: Internal Medicine | Admitting: Internal Medicine

## 2020-12-23 DIAGNOSIS — Z79890 Hormone replacement therapy (postmenopausal): Secondary | ICD-10-CM

## 2020-12-23 DIAGNOSIS — Z794 Long term (current) use of insulin: Secondary | ICD-10-CM

## 2020-12-23 DIAGNOSIS — F03918 Unspecified dementia, unspecified severity, with other behavioral disturbance: Secondary | ICD-10-CM | POA: Diagnosis present

## 2020-12-23 DIAGNOSIS — E119 Type 2 diabetes mellitus without complications: Secondary | ICD-10-CM | POA: Diagnosis not present

## 2020-12-23 DIAGNOSIS — N136 Pyonephrosis: Secondary | ICD-10-CM | POA: Diagnosis present

## 2020-12-23 DIAGNOSIS — E876 Hypokalemia: Secondary | ICD-10-CM | POA: Diagnosis not present

## 2020-12-23 DIAGNOSIS — E785 Hyperlipidemia, unspecified: Secondary | ICD-10-CM | POA: Diagnosis present

## 2020-12-23 DIAGNOSIS — R6520 Severe sepsis without septic shock: Secondary | ICD-10-CM | POA: Diagnosis present

## 2020-12-23 DIAGNOSIS — I951 Orthostatic hypotension: Secondary | ICD-10-CM | POA: Diagnosis present

## 2020-12-23 DIAGNOSIS — F32A Depression, unspecified: Secondary | ICD-10-CM | POA: Diagnosis present

## 2020-12-23 DIAGNOSIS — Z833 Family history of diabetes mellitus: Secondary | ICD-10-CM

## 2020-12-23 DIAGNOSIS — D84821 Immunodeficiency due to drugs: Secondary | ICD-10-CM | POA: Diagnosis present

## 2020-12-23 DIAGNOSIS — I10 Essential (primary) hypertension: Secondary | ICD-10-CM | POA: Diagnosis present

## 2020-12-23 DIAGNOSIS — E039 Hypothyroidism, unspecified: Secondary | ICD-10-CM | POA: Diagnosis present

## 2020-12-23 DIAGNOSIS — Z20822 Contact with and (suspected) exposure to covid-19: Secondary | ICD-10-CM | POA: Diagnosis present

## 2020-12-23 DIAGNOSIS — K529 Noninfective gastroenteritis and colitis, unspecified: Secondary | ICD-10-CM | POA: Diagnosis present

## 2020-12-23 DIAGNOSIS — K625 Hemorrhage of anus and rectum: Secondary | ICD-10-CM | POA: Diagnosis present

## 2020-12-23 DIAGNOSIS — G309 Alzheimer's disease, unspecified: Secondary | ICD-10-CM | POA: Diagnosis not present

## 2020-12-23 DIAGNOSIS — Z90710 Acquired absence of both cervix and uterus: Secondary | ICD-10-CM

## 2020-12-23 DIAGNOSIS — G3183 Dementia with Lewy bodies: Secondary | ICD-10-CM | POA: Diagnosis present

## 2020-12-23 DIAGNOSIS — F0390 Unspecified dementia without behavioral disturbance: Secondary | ICD-10-CM | POA: Diagnosis present

## 2020-12-23 DIAGNOSIS — G9341 Metabolic encephalopathy: Secondary | ICD-10-CM | POA: Diagnosis present

## 2020-12-23 DIAGNOSIS — E1165 Type 2 diabetes mellitus with hyperglycemia: Secondary | ICD-10-CM | POA: Diagnosis present

## 2020-12-23 DIAGNOSIS — J189 Pneumonia, unspecified organism: Secondary | ICD-10-CM | POA: Clinically undetermined

## 2020-12-23 DIAGNOSIS — R7881 Bacteremia: Secondary | ICD-10-CM

## 2020-12-23 DIAGNOSIS — N390 Urinary tract infection, site not specified: Secondary | ICD-10-CM

## 2020-12-23 DIAGNOSIS — Z940 Kidney transplant status: Secondary | ICD-10-CM | POA: Diagnosis not present

## 2020-12-23 DIAGNOSIS — K6289 Other specified diseases of anus and rectum: Secondary | ICD-10-CM

## 2020-12-23 DIAGNOSIS — Z79899 Other long term (current) drug therapy: Secondary | ICD-10-CM

## 2020-12-23 DIAGNOSIS — B957 Other staphylococcus as the cause of diseases classified elsewhere: Secondary | ICD-10-CM | POA: Diagnosis present

## 2020-12-23 DIAGNOSIS — Z9049 Acquired absence of other specified parts of digestive tract: Secondary | ICD-10-CM

## 2020-12-23 DIAGNOSIS — A419 Sepsis, unspecified organism: Principal | ICD-10-CM | POA: Diagnosis present

## 2020-12-23 DIAGNOSIS — Z800 Family history of malignant neoplasm of digestive organs: Secondary | ICD-10-CM

## 2020-12-23 DIAGNOSIS — B954 Other streptococcus as the cause of diseases classified elsewhere: Secondary | ICD-10-CM | POA: Diagnosis present

## 2020-12-23 DIAGNOSIS — F0283 Dementia in other diseases classified elsewhere, unspecified severity, with mood disturbance: Secondary | ICD-10-CM | POA: Diagnosis present

## 2020-12-23 DIAGNOSIS — E11649 Type 2 diabetes mellitus with hypoglycemia without coma: Secondary | ICD-10-CM | POA: Diagnosis not present

## 2020-12-23 DIAGNOSIS — F02C0 Dementia in other diseases classified elsewhere, severe, without behavioral disturbance, psychotic disturbance, mood disturbance, and anxiety: Secondary | ICD-10-CM | POA: Diagnosis not present

## 2020-12-23 DIAGNOSIS — D696 Thrombocytopenia, unspecified: Secondary | ICD-10-CM | POA: Diagnosis present

## 2020-12-23 LAB — CBC WITH DIFFERENTIAL/PLATELET
Abs Immature Granulocytes: 0.06 10*3/uL (ref 0.00–0.07)
Basophils Absolute: 0 10*3/uL (ref 0.0–0.1)
Basophils Relative: 0 %
Eosinophils Absolute: 0 10*3/uL (ref 0.0–0.5)
Eosinophils Relative: 0 %
HCT: 42.5 % (ref 36.0–46.0)
Hemoglobin: 13.4 g/dL (ref 12.0–15.0)
Immature Granulocytes: 1 %
Lymphocytes Relative: 6 %
Lymphs Abs: 0.6 10*3/uL — ABNORMAL LOW (ref 0.7–4.0)
MCH: 25.9 pg — ABNORMAL LOW (ref 26.0–34.0)
MCHC: 31.5 g/dL (ref 30.0–36.0)
MCV: 82 fL (ref 80.0–100.0)
Monocytes Absolute: 0.2 10*3/uL (ref 0.1–1.0)
Monocytes Relative: 2 %
Neutro Abs: 9.3 10*3/uL — ABNORMAL HIGH (ref 1.7–7.7)
Neutrophils Relative %: 91 %
Platelets: 138 10*3/uL — ABNORMAL LOW (ref 150–400)
RBC: 5.18 MIL/uL — ABNORMAL HIGH (ref 3.87–5.11)
RDW: 15.5 % (ref 11.5–15.5)
WBC: 10.3 10*3/uL (ref 4.0–10.5)
nRBC: 0 % (ref 0.0–0.2)

## 2020-12-23 LAB — COMPREHENSIVE METABOLIC PANEL
ALT: 15 U/L (ref 0–44)
AST: 20 U/L (ref 15–41)
Albumin: 3.5 g/dL (ref 3.5–5.0)
Alkaline Phosphatase: 73 U/L (ref 38–126)
Anion gap: 10 (ref 5–15)
BUN: 18 mg/dL (ref 8–23)
CO2: 20 mmol/L — ABNORMAL LOW (ref 22–32)
Calcium: 8.7 mg/dL — ABNORMAL LOW (ref 8.9–10.3)
Chloride: 103 mmol/L (ref 98–111)
Creatinine, Ser: 1.07 mg/dL — ABNORMAL HIGH (ref 0.44–1.00)
GFR, Estimated: 56 mL/min — ABNORMAL LOW (ref >60)
Glucose, Bld: 440 mg/dL — ABNORMAL HIGH (ref 70–99)
Potassium: 3.8 mmol/L (ref 3.5–5.1)
Sodium: 133 mmol/L — ABNORMAL LOW (ref 135–145)
Total Bilirubin: 2.9 mg/dL — ABNORMAL HIGH (ref 0.3–1.2)
Total Protein: 6.7 g/dL (ref 6.5–8.1)

## 2020-12-23 LAB — URINALYSIS, ROUTINE W REFLEX MICROSCOPIC
Bilirubin Urine: NEGATIVE
Glucose, UA: >=500 mg/dL — AB
Hgb urine dipstick: NEGATIVE
Ketones, ur: 5 mg/dL — AB
Nitrite: NEGATIVE
Protein, ur: NEGATIVE mg/dL
Specific Gravity, Urine: 1.015 (ref 1.005–1.030)
WBC, UA: >50 WBC/hpf — ABNORMAL HIGH (ref 0–5)
pH: 5 (ref 5.0–8.0)

## 2020-12-23 LAB — LACTIC ACID, PLASMA
Lactic Acid, Venous: 2.3 mmol/L (ref 0.5–1.9)
Lactic Acid, Venous: 1.8 mmol/L (ref 0.5–1.9)

## 2020-12-23 LAB — APTT: aPTT: 29 seconds (ref 24–36)

## 2020-12-23 LAB — RESP PANEL BY RT-PCR (FLU A&B, COVID) ARPGX2
Influenza A by PCR: NEGATIVE
Influenza B by PCR: NEGATIVE
SARS Coronavirus 2 by RT PCR: NEGATIVE

## 2020-12-23 LAB — PROTIME-INR
INR: 1.1 (ref 0.8–1.2)
Prothrombin Time: 14.5 seconds (ref 11.4–15.2)

## 2020-12-23 LAB — POC OCCULT BLOOD, ED: Fecal Occult Bld: POSITIVE — AB

## 2020-12-23 MED ORDER — LACTATED RINGERS IV SOLN: INTRAVENOUS | Status: DC

## 2020-12-23 MED ORDER — LACTATED RINGERS IV BOLUS (SEPSIS)
1000.0000 mL | Freq: Once | INTRAVENOUS | Status: AC
  Administered 2020-12-23: 1000 mL via INTRAVENOUS

## 2020-12-23 MED ORDER — IOHEXOL 350 MG/ML SOLN
80.0000 mL | Freq: Once | INTRAVENOUS | Status: AC | PRN
  Administered 2020-12-23: 80 mL via INTRAVENOUS

## 2020-12-23 MED ORDER — SODIUM CHLORIDE 0.9 % IV SOLN
2.0000 g | INTRAVENOUS | Status: DC
  Administered 2020-12-23 – 2020-12-27 (×4): 2 g via INTRAVENOUS
  Filled 2020-12-23 (×4): qty 20

## 2020-12-23 MED ORDER — ACETAMINOPHEN 650 MG RE SUPP
650.0000 mg | Freq: Once | RECTAL | Status: AC
  Administered 2020-12-23: 650 mg via RECTAL
  Filled 2020-12-23: qty 1

## 2020-12-23 MED ORDER — ACETAMINOPHEN 325 MG PO TABS
650.0000 mg | ORAL_TABLET | Freq: Once | ORAL | Status: DC
  Filled 2020-12-23: qty 2

## 2020-12-23 MED ORDER — METRONIDAZOLE 500 MG/100ML IV SOLN
500.0000 mg | Freq: Two times a day (BID) | INTRAVENOUS | Status: DC
  Administered 2020-12-23 – 2020-12-25 (×4): 500 mg via INTRAVENOUS
  Filled 2020-12-23 (×4): qty 100

## 2020-12-23 MED ORDER — SODIUM CHLORIDE 0.9 % IV SOLN
500.0000 mg | INTRAVENOUS | Status: DC
  Administered 2020-12-23 – 2020-12-24 (×2): 500 mg via INTRAVENOUS
  Filled 2020-12-23 (×3): qty 5

## 2020-12-23 NOTE — Sepsis Progress Note (Signed)
Following for sepsis monitoring ?

## 2020-12-23 NOTE — ED Notes (Signed)
Date and time results received: 12/23/20 2005  Test: Lactic   Critical Value: 2.3   Name of Provider Notified: Nanavati  Orders Received? Or Actions Taken?: n/a

## 2020-12-23 NOTE — ED Provider Notes (Addendum)
[facility]-EMERGENCY DEPT Provider Note   CSN: [id number] Arrival date & time: 12/23/20  1811     History Chief Complaint  Patient presents with   Altered Mental Status    sepsis    Emma Maldonado is a 70 y.o. female.  HPI     70 year old female comes in with chief complaint of altered mental status, weakness.  Level 5 caveat for dementia, altered mental status.  Patient with history of CKD, diabetes.  I spoke with nursing home at [facility] house, they reported that patient was doing well yesterday, and at 3 PM he was found to be febrile and confused, weak.  Patient was having rigors.  Patient unable to provide meaningful history.  Past Medical History:  Diagnosis Date   Dementia (HCC)    Diabetes mellitus without complication (HCC)    Hypertension    Renal disorder    Renal insufficiency     Patient Active Problem List   Diagnosis Date Noted   Hypomagnesemia    Acute renal failure (HCC)    Hypotension 08/22/2020   Hydronephrosis of right kidney 05/01/2020   Rib fractures 05/01/2020   UTI (urinary tract infection) 03/25/2020   Diabetes mellitus type 2, uncomplicated (HCC) 03/25/2020   Hypokalemia 03/25/2020   Essential hypertension 03/25/2020   Dementia (HCC) 03/25/2020   Altered mental status 03/25/2020   Uncontrolled type 2 diabetes mellitus with hyperglycemia (HCC) 03/25/2020   Accelerated hypertension 03/28/2012   Viral gastroenteritis 03/28/2012   Sepsis (HCC) 03/26/2012   History of renal transplant 03/26/2012   Diabetes mellitus (HCC) 03/26/2012   Hyperlipidemia 03/26/2012    Past Surgical History:  Procedure Laterality Date   ABDOMINAL HYSTERECTOMY     CHOLECYSTECTOMY     NEPHRECTOMY TRANSPLANTED ORGAN       OB History   No obstetric history on file.     Family History  Problem Relation Age of Onset   Diabetes Mellitus II Father     Social History   Tobacco Use   Smoking status: Never   Smokeless  tobacco: Never  Substance Use Topics   Alcohol use: No   Drug use: No    Home Medications Prior to Admission medications   Medication Sig Start Date End Date Taking? Authorizing Provider  acetaminophen (TYLENOL) 500 MG tablet Take 500 mg by mouth every 6 (six) hours as needed for fever or headache.    [provider]  alum & mag hydroxide-simeth (MAALOX/MYLANTA) 200-200-20 MG/5ML suspension Take 30 mLs by mouth every 6 (six) hours as needed for indigestion or heartburn.    [provider]  Cholecalciferol (VITAMIN D3) 50 MCG (2000 UT) TABS Take 2,000 Units by mouth daily.    [provider]  feeding supplement (ENSURE SURGERY) LIQD Take 237 mLs by mouth daily. 09/03/20   Rodolph Bong, MD  feeding supplement, GLUCERNA SHAKE, (GLUCERNA SHAKE) LIQD Take 237 mLs by mouth 3 (three) times daily between meals. 08/26/20   [name], MD  feeding supplement, GLUCERNA SHAKE, (GLUCERNA SHAKE) LIQD Take 237 mLs by mouth 2 (two) times daily between meals. 09/02/20   Rodolph Bong, MD  fludrocortisone (FLORINEF) 0.1 MG tablet Take 0.1 mg by mouth at bedtime.     [provider]  guaifenesin (ROBITUSSIN) 100 MG/5ML syrup Take 200 mg by mouth every 6 (six) hours as needed for cough.    [provider]  insulin aspart (NOVOLOG) 100 UNIT/ML injection Inject 10 Units into the skin See admin  instructions. Inject 10 units into the skin three times a day after meals and hold if not eating    [provider]  LANTUS SOLOSTAR 100 UNIT/ML Solostar Pen Inject 20 Units into the skin in the morning. 08/26/20   [name], MD  levothyroxine (SYNTHROID) 100 MCG tablet Take 100 mcg by mouth daily before breakfast. 03/04/20   [provider]  loperamide (IMODIUM) 2 MG capsule Take 2 mg by mouth as needed (with each loose stool for diarrhea- cannot exceed 8 doses a day). 02/20/20   [provider]  magnesium hydroxide (MILK OF MAGNESIA) 400 MG/5ML  suspension Take 30 mLs by mouth at bedtime as needed for mild constipation.    [provider]  memantine (NAMENDA) 10 MG tablet Take 10 mg by mouth every 12 (twelve) hours.    [provider]  Multiple Vitamin (MULTIVITAMIN WITH MINERALS) TABS tablet Take 1 tablet by mouth daily. 08/26/20   [name], MD  neomycin-bacitracin-polymyxin (NEOSPORIN) [phone] ointment Apply 1 application topically as needed (for minor skin tears or abrasions, after cleaning with normal saline- bandage with gauze and tape).    [provider]  Nutritional Supplements (,FEEDING SUPPLEMENT, PROSOURCE PLUS) liquid Take 30 mLs by mouth 2 (two) times daily between meals. 09/02/20   Rodolph Bong, MD  predniSONE (DELTASONE) 5 MG tablet Take 5 mg by mouth every morning.    [provider]  QUEtiapine (SEROQUEL) 25 MG tablet Take 25 mg by mouth at bedtime.    [provider]  rosuvastatin (CRESTOR) 5 MG tablet Take 5 mg by mouth at bedtime. 12/01/19   [provider]  tacrolimus (PROGRAF) 0.5 MG capsule Take 1 capsule (0.5 mg total) by mouth daily. 03/28/12   Dhungel, Theda Belfast, MD    Allergies    Patient has no known allergies.  Review of Systems   Review of Systems  Unable to perform ROS: Dementia   Physical Exam Updated Vital Signs BP 131/72   Pulse (!) 103   Temp (!) 103.8 F (39.9 C) (Rectal)   Resp (!) 25   SpO2 94%   Physical Exam Vitals and nursing note reviewed.  Constitutional:      Appearance: She is well-developed.     Comments: Somnolent  HENT:     Head: Atraumatic.  Eyes:     Extraocular Movements: Extraocular movements intact.     Pupils: Pupils are equal, round, and reactive to light.  Cardiovascular:     Rate and Rhythm: Tachycardia present.  Pulmonary:     Effort: Pulmonary effort is normal.  Abdominal:     General: There is distension.     Tenderness: There is no abdominal tenderness.     Comments: Patient is noted to have  anal discharge that appears to be purulent  Musculoskeletal:     Cervical back: Neck supple.  Skin:    General: Skin is warm and dry.  Neurological:     Mental Status: She is disoriented.    ED Results / Procedures / Treatments   Labs (all labs ordered are listed, but only abnormal results are displayed) Labs Reviewed  LACTIC ACID, PLASMA - Abnormal; Notable for the following components:      Result Value   Lactic Acid, Venous 2.3 (*)    All other components within normal limits  COMPREHENSIVE METABOLIC PANEL - Abnormal; Notable for the following components:   Sodium 133 (*)    CO2 20 (*)    Glucose, Bld  440 (*)    Creatinine, Ser 1.07 (*)    Calcium 8.7 (*)    Total Bilirubin 2.9 (*)    GFR, Estimated 56 (*)    All other components within normal limits  CBC WITH DIFFERENTIAL/PLATELET - Abnormal; Notable for the following components:   RBC 5.18 (*)    MCH 25.9 (*)    Platelets 138 (*)    Neutro Abs 9.3 (*)    Lymphs Abs 0.6 (*)    All other components within normal limits  URINALYSIS, ROUTINE W REFLEX MICROSCOPIC - Abnormal; Notable for the following components:   APPearance CLOUDY (*)    Glucose, UA >=500 (*)    Ketones, ur 5 (*)    Leukocytes,Ua SMALL (*)    WBC, UA >50 (*)    Bacteria, UA FEW (*)    All other components within normal limits  POC OCCULT BLOOD, ED - Abnormal; Notable for the following components:   Fecal Occult Bld POSITIVE (*)    All other components within normal limits  RESP PANEL BY RT-PCR (FLU A&B, COVID) ARPGX2  CULTURE, BLOOD (ROUTINE X 2)  CULTURE, BLOOD (ROUTINE X 2)  LACTIC ACID, PLASMA  PROTIME-INR  APTT  POC OCCULT BLOOD, ED  GC/CHLAMYDIA PROBE AMP ([place]) NOT AT [facility]    EKG None  Radiology CT ABDOMEN PELVIS W CONTRAST  Result Date: 12/23/2020 CLINICAL DATA:  Sepsis possible rectal abscess EXAM: CT ABDOMEN AND PELVIS WITH CONTRAST TECHNIQUE: Multidetector CT imaging of the abdomen and pelvis was performed using the  standard protocol following bolus administration of intravenous contrast. CONTRAST:  27mL OMNIPAQUE IOHEXOL 350 MG/ML SOLN COMPARISON:  CT 09/09/2020, 05/01/2020 FINDINGS: Lower chest: Lung bases demonstrate new airspace consolidation at the right greater than left lung base with mild bronchial wall thickening, suspicious for pneumonia or aspiration. No pleural effusion. Stable cardiac size. Hepatobiliary: Status post cholecystectomy. No intra hepatic biliary dilatation. Stable prominence of the common bile duct. Pancreas: Atrophic.  No inflammatory changes. Spleen: Normal in size without focal abnormality. Adrenals/Urinary Tract: Adrenal glands are normal. Atrophic native kidneys with vascular calcification. Right lower quadrant transplanted kidney with several small cysts. Mild right hydronephrosis. Mild urothelial thickening and possible narrowing at the ureteral implantation site at the anterior bladder, series 2, image 78. Stomach/Bowel: The stomach is nonenlarged. No dilated small bowel. Diverticular disease of the left colon without acute wall thickening. Circumferential wall thickening of the rectum with mild presacral soft tissue stranding. No organized perirectal abscess. Mild soft tissue infiltration of the gluteal soft tissues but without organized abscess. Vascular/Lymphatic: Moderate aortic atherosclerosis. No aneurysm. No suspicious nodes Reproductive: Status post hysterectomy. No adnexal masses. Other: Negative for pelvic effusion or free air. Fat fluid level in the subcutaneous fat of the right abdominal wall. Soft tissue infiltration and calcifications of the subcutaneous fat of the anterior abdominal wall as before. Musculoskeletal: No acute osseous abnormality. Healing posterior rib fractures. Healing right first second third and fourth transverse process fractures. IMPRESSION: 1. Circumferential thickening of the rectum with presacral edema and mild soft tissue stranding suggestive of proctitis.  No organized perirectal abscess. Correlate with direct inspection to exclude wall thickening secondary to neoplasm/mass. 2. Right lower quadrant transplant kidney with mild right hydronephrosis. Mild urothelial thickening and enhancement of the distal ureter with possible narrowing at implantation site of distal ureter. Similar appearance on CT from April 2022. Suggest correlation with urinalysis to exclude ascending urinary tract infection. 3. Patchy airspace disease at the right greater than left lung  base suspicious for pneumonia or aspiration Electronically Signed   By: Jasmine Pang M.D.   On: 12/23/2020 22:25   DG Chest Port 1 View  Result Date: 12/23/2020 CLINICAL DATA:  Multiple status, code sepsis EXAM: PORTABLE CHEST 1 VIEW COMPARISON:  08/22/2020 FINDINGS: Lungs are clear.  No pleural effusion or pneumothorax. The heart is normal in size. IMPRESSION: No evidence of acute cardiopulmonary disease. Electronically Signed   By: Charline Bills M.D.   On: 12/23/2020 19:43    Procedures .Critical Care Performed by: Derwood Kaplan, MD Authorized by: Derwood Kaplan, MD   Critical care provider statement:    Critical care time (minutes):  48   Critical care was necessary to treat or prevent imminent or life-threatening deterioration of the following conditions:  Sepsis   Critical care was time spent personally by me on the following activities:  Development of treatment plan with patient or surrogate, discussions with consultants, evaluation of patient's response to treatment, examination of patient, ordering and review of laboratory studies, ordering and review of radiographic studies, ordering and performing treatments and interventions, pulse oximetry, re-evaluation of patient's condition, review of old charts and obtaining history from patient or surrogate   Medications Ordered in ED Medications  lactated ringers infusion ( Intravenous New Bag/Given 12/23/20 2055)  cefTRIAXone (ROCEPHIN)  2 g in sodium chloride 0.9 % 100 mL IVPB (0 g Intravenous Stopped 12/23/20 2148)  azithromycin (ZITHROMAX) 500 mg in sodium chloride 0.9 % 250 mL IVPB (0 mg Intravenous Stopped 12/23/20 2253)  metroNIDAZOLE (FLAGYL) IVPB 500 mg (has no administration in time range)  lactated ringers bolus 1,000 mL (0 mLs Intravenous Stopped 12/23/20 2234)  acetaminophen (TYLENOL) suppository 650 mg (650 mg Rectal Given 12/23/20 2148)  iohexol (OMNIPAQUE) 350 MG/ML injection 80 mL (80 mLs Intravenous Contrast Given 12/23/20 2155)    ED Course  I have reviewed the triage vital signs and the nursing notes.  Pertinent labs & imaging results that were available during my care of the patient were reviewed by me and considered in my medical decision making (see chart for details).    MDM Rules/Calculators/A&P                           70 year old female comes in with chief complaint of altered mental status.  Patient noted to be febrile.  During my assessment, she was coughing.  Subsequently, we noted that she was having pus type drainage from her rectum  It appears clinically that she is likely septic with altered mental status.  Likely source for infection include pneumonia, bladder and also colon/rectum.  IV fluids initiated.  Rectal Tylenol ordered.  COVID-19 and flu test are negative.  CT scan reveals proctitis, questionable inflammation/edema of the ureters with mild hydronephrosis.  She is receiving ceftriaxone that should cover for both pyelonephritis/UTI and also intra-abdominal pathology.  CT scan also shows possible pneumonia -we will give her azithromycin.  Patient has received Flagyl as well.  I have cultured the purulent drainage and also send a GC/chlamydia swab.  Final Clinical Impression(s) / ED Diagnoses Final diagnoses:  Proctitis  Lower urinary tract infectious disease  Severe sepsis [facility])    Rx / DC Orders ED Discharge Orders     None        Derwood Kaplan, MD 12/23/20  2297    Derwood Kaplan, MD 12/23/20 2336

## 2020-12-23 NOTE — ED Triage Notes (Signed)
Pt presents to the ED from SNF via EMS for AMS. EMS paged out code sepsis PTA. Hx provided by EMS. Per EMS, pt is normally A&O x4 however today has been altered. Pt was diagnosed with PNA today however EMS is unaware if pt is Covid positive. Per EMS, the pt is spanish speaking.

## 2020-12-23 NOTE — H&P (Signed)
History and Physical    Emma Maldonado YKD:[id number] DOB: 1950-04-04 DOA: 12/23/2020  PCP: Patient, No Pcp Per (Inactive)  Patient coming from: [facility] facility  I have personally briefly reviewed patient's old medical records in [place]  Chief Complaint: Altered mental status  HPI: Emma Maldonado is a Spanish-speaking 70 y.o. female with medical history significant for insulin-dependent T2DM, HTN, HLD, s/p living donor renal transplant in 1997 on chronic immunosuppression, hypothyroidism, orthostatic hypotension on Florinef, and advanced Lewy body dementia who presented to the ED for evaluation of altered mental status.  Patient is unable to provide any history due to altered mental status and is otherwise obtained from [name], nursing, and chart review.  Patient currently resides at [facility].  Patient was in her usual state of health on 12/11.  Around 3 PM on 12/12 she was found to be febrile, confused, weak and appeared to be having rigors.  She was not communicating which was a change from her baseline.  EMS were called and she was subsequently sent to the ED for further evaluation.  ED Course:  Initial vitals showed BP 150/76, pulse 110, RR 25, temp 103.8 F rectally, SPO2 94% on room air.  Labs show WBC 10.3, hemoglobin 13.4, platelets 138,000, sodium 133, potassium 3.8, bicarb 20, BUN 18, creatinine 1.07, serum glucose 440, AST 20, LT 15, alk phos 73, total bilirubin 2.9.  Lactic acid 2.3 > 1.8.  SARS-CoV-2 and influenza PCR negative.  Blood cultures in process.  Urinalysis shows negative nitrates, small leukocytes, 0-5 RBC, >50 WBC, few bacteria on microscopy.  Per [name], rectal exam showed purulent discharge and blood.  FOBT was positive.  Portable chest x-ray negative for focal consolidation, edema, effusion.  CT abdomen/pelvis with contrast shows circumferential thickening of the rectum with presacral edema and mild soft tissue  stranding suggestive of proctitis.  No perirectal abscess seen.  RLQ transplant kidney seen with mild right hydronephrosis.  Mild urothelial thickening and enhancement of the distal ureter with possible narrowing at implantation site of distal ureter seen, similar appearance to CT from April 2022.  Patchy airspace disease at the right greater than left lung bases noted.  Patient was given IV ceftriaxone, azithromycin, Flagyl, 1 L LR followed by maintenance fluids.  GC/chlamydia was collected and pending.  The hospitalist service was consulted to admit for further evaluation and management.  Review of Systems:  Unable to obtain full review of systems due to altered mental status.   Past Medical History:  Diagnosis Date   Dementia ([place])    Diabetes mellitus without complication ([place])    Hypertension    Renal disorder    Renal insufficiency     Past Surgical History:  Procedure Laterality Date   ABDOMINAL HYSTERECTOMY     CHOLECYSTECTOMY     NEPHRECTOMY TRANSPLANTED ORGAN      Social History:  reports that she has never smoked. She has never used smokeless tobacco. She reports that she does not drink alcohol and does not use drugs.  No Known Allergies  Family History  Problem Relation Age of Onset   Diabetes Mellitus II Father      Prior to Admission medications   Medication Sig Start Date End Date Taking? Authorizing Provider  acetaminophen (TYLENOL) 500 MG tablet Take 500 mg by mouth every 6 (six) hours as needed for fever or headache.    [provider]  alum & mag hydroxide-simeth (MAALOX/MYLANTA) 200-200-20 MG/5ML suspension Take 30 mLs  by mouth every 6 (six) hours as needed for indigestion or heartburn.    [provider]  Cholecalciferol (VITAMIN D3) 50 MCG (2000 UT) TABS Take 2,000 Units by mouth daily.    [provider]  feeding supplement (ENSURE SURGERY) LIQD Take 237 mLs by mouth daily. 09/03/20   Eugenie Filler, MD  feeding supplement,  GLUCERNA SHAKE, (GLUCERNA SHAKE) LIQD Take 237 mLs by mouth 3 (three) times daily between meals. 08/26/20   Mercy Riding, MD  feeding supplement, GLUCERNA SHAKE, (GLUCERNA SHAKE) LIQD Take 237 mLs by mouth 2 (two) times daily between meals. 09/02/20   Eugenie Filler, MD  fludrocortisone (FLORINEF) 0.1 MG tablet Take 0.1 mg by mouth at bedtime.     [provider]  guaifenesin (ROBITUSSIN) 100 MG/5ML syrup Take 200 mg by mouth every 6 (six) hours as needed for cough.    [provider]  insulin aspart (NOVOLOG) 100 UNIT/ML injection Inject 10 Units into the skin See admin instructions. Inject 10 units into the skin three times a day after meals and hold if not eating    [provider]  LANTUS SOLOSTAR 100 UNIT/ML Solostar Pen Inject 20 Units into the skin in the morning. 08/26/20   Mercy Riding, MD  levothyroxine (SYNTHROID) 100 MCG tablet Take 100 mcg by mouth daily before breakfast. 03/04/20   [provider]  loperamide (IMODIUM) 2 MG capsule Take 2 mg by mouth as needed (with each loose stool for diarrhea- cannot exceed 8 doses a day). 02/20/20   [provider]  magnesium hydroxide (MILK OF MAGNESIA) 400 MG/5ML suspension Take 30 mLs by mouth at bedtime as needed for mild constipation.    [provider]  memantine (NAMENDA) 10 MG tablet Take 10 mg by mouth every 12 (twelve) hours.    [provider]  Multiple Vitamin (MULTIVITAMIN WITH MINERALS) TABS tablet Take 1 tablet by mouth daily. 08/26/20   Mercy Riding, MD  neomycin-bacitracin-polymyxin (NEOSPORIN) [phone] ointment Apply 1 application topically as needed (for minor skin tears or abrasions, after cleaning with normal saline- bandage with gauze and tape).    [provider]  Nutritional Supplements (,FEEDING SUPPLEMENT, PROSOURCE PLUS) liquid Take 30 mLs by mouth 2 (two) times daily between meals. 09/02/20   Eugenie Filler, MD  predniSONE (DELTASONE) 5 MG tablet  Take 5 mg by mouth every morning.    [provider]  QUEtiapine (SEROQUEL) 25 MG tablet Take 25 mg by mouth at bedtime.    [provider]  rosuvastatin (CRESTOR) 5 MG tablet Take 5 mg by mouth at bedtime. 12/01/19   [provider]  tacrolimus (PROGRAF) 0.5 MG capsule Take 1 capsule (0.5 mg total) by mouth daily. 03/28/12   Louellen Molder, MD    Physical Exam: Vitals:   12/24/20 0100 12/24/20 0115 12/24/20 0122 12/24/20 0130  BP: 132/60 113/62  130/61  Pulse: 99 96  95  Resp:      Temp:   (!) 102.3 F (39.1 C)   TempSrc:   Rectal   SpO2: 97% 93%  98%  Exam limited due to cooperation. Constitutional: Resting supine in bed, appears fatigued but in NAD, calm, comfortable Eyes: PERRL, lids and conjunctivae normal ENMT: Mucous membranes are dry. Posterior pharynx clear of any exudate or lesions.Normal dentition.  Neck: normal, supple, no masses. Respiratory: Coarse breath sounds anteriorly. Normal respiratory effort. No accessory muscle use.  Cardiovascular: Regular rate and rhythm, no murmurs / rubs / gallops.  No extremity edema. 2+ pedal pulses. Abdomen: no tenderness, no masses palpated.  Bowel sounds positive.  Perirectal edema with mucopurulent discharge present on exam, chaperone present. Musculoskeletal: no clubbing / cyanosis. No joint deformity upper and lower extremities. Good ROM, no contractures. Normal muscle tone.  Skin: no rashes, lesions, ulcers. No induration Neurologic: CN 2-12 grossly intact. Sensation intact. Strength 5/5 in all 4.  Psychiatric: Somnolent but awakens easily, tracks with eyes but is nonverbal, not following commands.  Labs on Admission: I have personally reviewed following labs and imaging studies  CBC: Recent Labs  Lab 12/23/20 1906  WBC 10.3  NEUTROABS 9.3*  HGB 13.4  HCT 42.5  MCV 82.0  PLT 338*   Basic Metabolic Panel: Recent Labs  Lab 12/23/20 1906  NA 133*  K 3.8  CL 103  CO2 20*  GLUCOSE 440*  BUN  18  CREATININE 1.07*  CALCIUM 8.7*   GFR: CrCl cannot be calculated (Unknown ideal weight.). Liver Function Tests: Recent Labs  Lab 12/23/20 1906  AST 20  ALT 15  ALKPHOS 73  BILITOT 2.9*  PROT 6.7  ALBUMIN 3.5   No results for input(s): LIPASE, AMYLASE in the last 168 hours. No results for input(s): AMMONIA in the last 168 hours. Coagulation Profile: Recent Labs  Lab 12/23/20 1906  INR 1.1   Cardiac Enzymes: No results for input(s): CKTOTAL, CKMB, CKMBINDEX, TROPONINI in the last 168 hours. BNP (last 3 results) No results for input(s): PROBNP in the last 8760 hours. HbA1C: No results for input(s): HGBA1C in the last 72 hours. CBG: No results for input(s): GLUCAP in the last 168 hours. Lipid Profile: No results for input(s): CHOL, HDL, LDLCALC, TRIG, CHOLHDL, LDLDIRECT in the last 72 hours. Thyroid Function Tests: No results for input(s): TSH, T4TOTAL, FREET4, T3FREE, THYROIDAB in the last 72 hours. Anemia Panel: No results for input(s): VITAMINB12, FOLATE, FERRITIN, TIBC, IRON, RETICCTPCT in the last 72 hours. Urine analysis:    Component Value Date/Time   COLORURINE YELLOW 12/23/2020 2203   APPEARANCEUR CLOUDY (A) 12/23/2020 2203   LABSPEC 1.015 12/23/2020 2203   PHURINE 5.0 12/23/2020 2203   GLUCOSEU >=500 (A) 12/23/2020 2203   HGBUR NEGATIVE 12/23/2020 2203   BILIRUBINUR NEGATIVE 12/23/2020 2203   KETONESUR 5 (A) 12/23/2020 2203   PROTEINUR NEGATIVE 12/23/2020 2203   UROBILINOGEN 1.0 08/14/2012 1022   NITRITE NEGATIVE 12/23/2020 2203   LEUKOCYTESUR SMALL (A) 12/23/2020 2203    Radiological Exams on Admission: CT ABDOMEN PELVIS W CONTRAST  Result Date: 12/23/2020 CLINICAL DATA:  Sepsis possible rectal abscess EXAM: CT ABDOMEN AND PELVIS WITH CONTRAST TECHNIQUE: Multidetector CT imaging of the abdomen and pelvis was performed using the standard protocol following bolus administration of intravenous contrast. CONTRAST:  16mL OMNIPAQUE IOHEXOL 350 MG/ML  SOLN COMPARISON:  CT 09/09/2020, 05/01/2020 FINDINGS: Lower chest: Lung bases demonstrate new airspace consolidation at the right greater than left lung base with mild bronchial wall thickening, suspicious for pneumonia or aspiration. No pleural effusion. Stable cardiac size. Hepatobiliary: Status post cholecystectomy. No intra hepatic biliary dilatation. Stable prominence of the common bile duct. Pancreas: Atrophic.  No inflammatory changes. Spleen: Normal in size without focal abnormality. Adrenals/Urinary Tract: Adrenal glands are normal. Atrophic native kidneys with vascular calcification. Right lower quadrant transplanted kidney with several small cysts. Mild right hydronephrosis. Mild urothelial thickening and possible narrowing at the ureteral implantation site at the anterior bladder, series 2, image 78. Stomach/Bowel: The stomach is nonenlarged. No dilated small bowel. Diverticular disease of the left colon  without acute wall thickening. Circumferential wall thickening of the rectum with mild presacral soft tissue stranding. No organized perirectal abscess. Mild soft tissue infiltration of the gluteal soft tissues but without organized abscess. Vascular/Lymphatic: Moderate aortic atherosclerosis. No aneurysm. No suspicious nodes Reproductive: Status post hysterectomy. No adnexal masses. Other: Negative for pelvic effusion or free air. Fat fluid level in the subcutaneous fat of the right abdominal wall. Soft tissue infiltration and calcifications of the subcutaneous fat of the anterior abdominal wall as before. Musculoskeletal: No acute osseous abnormality. Healing posterior rib fractures. Healing right first second third and fourth transverse process fractures. IMPRESSION: 1. Circumferential thickening of the rectum with presacral edema and mild soft tissue stranding suggestive of proctitis. No organized perirectal abscess. Correlate with direct inspection to exclude wall thickening secondary to  neoplasm/mass. 2. Right lower quadrant transplant kidney with mild right hydronephrosis. Mild urothelial thickening and enhancement of the distal ureter with possible narrowing at implantation site of distal ureter. Similar appearance on CT from April 2022. Suggest correlation with urinalysis to exclude ascending urinary tract infection. 3. Patchy airspace disease at the right greater than left lung base suspicious for pneumonia or aspiration Electronically Signed   By: Donavan Foil M.D.   On: 12/23/2020 22:25   DG Chest Port 1 View  Result Date: 12/23/2020 CLINICAL DATA:  Multiple status, code sepsis EXAM: PORTABLE CHEST 1 VIEW COMPARISON:  08/22/2020 FINDINGS: Lungs are clear.  No pleural effusion or pneumothorax. The heart is normal in size. IMPRESSION: No evidence of acute cardiopulmonary disease. Electronically Signed   By: Julian Hy M.D.   On: 12/23/2020 19:43    EKG: Ordered and pending.  Assessment/Plan Principal Problem:   Severe sepsis (HCC) Active Problems:   History of renal transplant   Insulin dependent type 2 diabetes mellitus (HCC)   Dementia (HCC)   Hypothyroidism   Proctitis   Emma Maldonado is a 70 y.o. female with medical history significant for insulin-dependent T2DM, HTN, HLD, s/p living donor renal transplant in 1997 on chronic immunosuppression, hypothyroidism, orthostatic hypotension on Florinef, and advanced Lewy body dementia who is admitted with severe sepsis due to proctitis.  Severe sepsis, POA: Patient presenting with fever with T-max 103.8 F rectally, tachypnea with RR in the 30s, tachycardia with pulse 100-110s, lactic acid 2.3.  She has multiple sources of infection with proctitis, bibasilar pneumonia, and possible ascending UTI. -Continue empiric antibiotics with IV ceftriaxone, azithromycin, Flagyl to cover for pneumonia, bronchitis, and possible UTI -Follow blood and urine cultures -Check strep pneumonia urinary antigen -Continue IV fluid  resuscitation overnight  Proctitis: Changes suggestive of proctitis seen on CT abdomen/pelvis without perirectal abscess.  Per nursing, was unable to give Tylenol suppository due to resistance in the rectum.  May need GI evaluation with sigmoidoscopy to exclude neoplasm/mass.  Continue antibiotics as above.  She is not taking oral meds so we will use IV Tylenol as needed for fever/pain.  Bibasilar pneumonia: CT A/P showed patchy airspace disease at the right greater than left lung bases and visualized portion of the lungs.  She does have frequent cough present on admission.  Concern is for pneumonia and/or aspiration.  She is currently saturating well on room air. -Continue ceftriaxone and azithromycin as above  Acute encephalopathy: Acute encephalopathy/delirium in setting of severe sepsis.  She is awake and alert but nonverbal on admission.  Continue management as above and monitor progress.  Insulin-dependent type 2 diabetes with hyperglycemia: Hyperglycemic on admission with serum glucose >400.  No evidence  of DKA or HHS.  Give 10 units subq NovoLog now and start on moderate SSI q4h while NPO.  History of living donor transplanted kidney in 1997 on chronic immunosuppression: Renal function currently intact.  CT abdomen/pelvis showed mild right hydronephrosis of the transplanted kidney as well as mild urothelial thickening of distal ureter with possible narrowing at implantation site of distal ureter.  Those findings were similar to CT from April 2022.  There is question of ascending UTI.   -She is on IV ceftriaxone as above -Urine culture ordered -Immunosuppressants currently on hold in setting of sepsis/n.p.o. status  Orthostatic hypotension: Florinef on hold while NPO.  Hypothyroidism: Placed on IV Synthroid while NPO.  Advanced dementia with depression: Currently resides at [facility].  Azalee body dementia per prior documentation.  Home meds are on hold while she  is NPO.  DVT prophylaxis: SCDs for now Code Status: Full code Family Communication: None available on admission Disposition Plan: From [facility], dispo pending clinical progress Consults called: None Level of care: Progressive Admission status:  Status is: Inpatient  Remains inpatient appropriate because: Admitted with severe sepsis with multiple infectious sources including proctitis, bibasilar pneumonia, and suspicion for ascending UTI.  Requires ongoing IV antibiotics and IV fluid hydration.  High risk for decompensation given multiple comorbidities including history of renal transplant on chronic immunosuppressants, insulin-dependent diabetes, and dementia.   Zada Finders MD [facility]  If 7PM-7AM, please contact night-coverage [email or web address]  12/24/2020, 1:52 AM

## 2020-12-24 LAB — GC/CHLAMYDIA PROBE AMP (~~LOC~~) NOT AT ARMC
Chlamydia: NEGATIVE
Comment: NEGATIVE
Comment: NORMAL
Neisseria Gonorrhea: NEGATIVE

## 2020-12-24 LAB — CBC
HCT: 36.9 % (ref 36.0–46.0)
Hemoglobin: 11.9 g/dL — ABNORMAL LOW (ref 12.0–15.0)
MCH: 26 pg (ref 26.0–34.0)
MCHC: 32.2 g/dL (ref 30.0–36.0)
MCV: 80.7 fL (ref 80.0–100.0)
Platelets: 129 10*3/uL — ABNORMAL LOW (ref 150–400)
RBC: 4.57 MIL/uL (ref 3.87–5.11)
RDW: 15.6 % — ABNORMAL HIGH (ref 11.5–15.5)
WBC: 13.6 10*3/uL — ABNORMAL HIGH (ref 4.0–10.5)
nRBC: 0 % (ref 0.0–0.2)

## 2020-12-24 LAB — BLOOD CULTURE ID PANEL (REFLEXED) - BCID2

## 2020-12-24 LAB — BASIC METABOLIC PANEL
Anion gap: 8 (ref 5–15)
BUN: 16 mg/dL (ref 8–23)
CO2: 22 mmol/L (ref 22–32)
Calcium: 7.9 mg/dL — ABNORMAL LOW (ref 8.9–10.3)
Chloride: 109 mmol/L (ref 98–111)
Creatinine, Ser: 0.92 mg/dL (ref 0.44–1.00)
GFR, Estimated: >60 mL/min (ref >60)
Glucose, Bld: 134 mg/dL — ABNORMAL HIGH (ref 70–99)
Potassium: 2.9 mmol/L — ABNORMAL LOW (ref 3.5–5.1)
Sodium: 139 mmol/L (ref 135–145)

## 2020-12-24 LAB — CBG MONITORING, ED
Glucose-Capillary: 119 mg/dL — ABNORMAL HIGH (ref 70–99)
Glucose-Capillary: 109 mg/dL — ABNORMAL HIGH (ref 70–99)
Glucose-Capillary: 125 mg/dL — ABNORMAL HIGH (ref 70–99)
Glucose-Capillary: 125 mg/dL — ABNORMAL HIGH (ref 70–99)

## 2020-12-24 LAB — STREP PNEUMONIAE URINARY ANTIGEN: Strep Pneumo Urinary Antigen: NEGATIVE

## 2020-12-24 LAB — HEMOGLOBIN A1C
Hgb A1c MFr Bld: 9.6 % — ABNORMAL HIGH (ref 4.8–5.6)
Mean Plasma Glucose: 228.82 mg/dL

## 2020-12-24 LAB — PROCALCITONIN: Procalcitonin: 13.38 ng/mL

## 2020-12-24 MED ORDER — QUETIAPINE FUMARATE 25 MG PO TABS
25.0000 mg | ORAL_TABLET | Freq: Every day | ORAL | Status: DC
  Administered 2020-12-24 – 2020-12-29 (×6): 25 mg via ORAL
  Filled 2020-12-24 (×6): qty 1

## 2020-12-24 MED ORDER — METOPROLOL TARTRATE 5 MG/5ML IV SOLN
5.0000 mg | INTRAVENOUS | Status: DC | PRN
  Administered 2020-12-27: 5 mg via INTRAVENOUS
  Filled 2020-12-24: qty 5

## 2020-12-24 MED ORDER — ONDANSETRON HCL 4 MG PO TABS: 4.0000 mg | ORAL_TABLET | Freq: Four times a day (QID) | ORAL | Status: DC | PRN

## 2020-12-24 MED ORDER — PREDNISONE 5 MG PO TABS
5.0000 mg | ORAL_TABLET | Freq: Every day | ORAL | Status: DC
  Administered 2020-12-24 – 2020-12-30 (×7): 5 mg via ORAL
  Filled 2020-12-24 (×8): qty 1

## 2020-12-24 MED ORDER — POTASSIUM CHLORIDE 10 MEQ/100ML IV SOLN
10.0000 meq | INTRAVENOUS | Status: AC
  Administered 2020-12-24 (×6): 10 meq via INTRAVENOUS
  Filled 2020-12-24 (×5): qty 100

## 2020-12-24 MED ORDER — ONDANSETRON HCL 4 MG/2ML IJ SOLN
4.0000 mg | Freq: Four times a day (QID) | INTRAMUSCULAR | Status: DC | PRN
  Administered 2020-12-24: 4 mg via INTRAVENOUS
  Filled 2020-12-24: qty 2

## 2020-12-24 MED ORDER — LEVOTHYROXINE SODIUM 100 MCG/5ML IV SOLN: 50.0000 ug | Freq: Every day | INTRAVENOUS | Status: DC

## 2020-12-24 MED ORDER — SODIUM CHLORIDE 0.9% FLUSH
3.0000 mL | Freq: Two times a day (BID) | INTRAVENOUS | Status: DC
  Administered 2020-12-24 – 2020-12-30 (×11): 3 mL via INTRAVENOUS

## 2020-12-24 MED ORDER — IPRATROPIUM-ALBUTEROL 0.5-2.5 (3) MG/3ML IN SOLN: 3.0000 mL | RESPIRATORY_TRACT | Status: DC | PRN

## 2020-12-24 MED ORDER — HYDRALAZINE HCL 20 MG/ML IJ SOLN
10.0000 mg | INTRAMUSCULAR | Status: DC | PRN
  Administered 2020-12-27 – 2020-12-30 (×2): 10 mg via INTRAVENOUS
  Filled 2020-12-24 (×2): qty 1

## 2020-12-24 MED ORDER — INSULIN ASPART 100 UNIT/ML IJ SOLN
0.0000 [IU] | Freq: Three times a day (TID) | INTRAMUSCULAR | Status: DC
  Administered 2020-12-24: 2 [IU] via SUBCUTANEOUS
  Administered 2020-12-25: 17:00:00 8 [IU] via SUBCUTANEOUS
  Administered 2020-12-25: 21:00:00 11 [IU] via SUBCUTANEOUS
  Administered 2020-12-25: 08:00:00 2 [IU] via SUBCUTANEOUS
  Administered 2020-12-25: 14:00:00 3 [IU] via SUBCUTANEOUS
  Administered 2020-12-26: 2 [IU] via SUBCUTANEOUS
  Administered 2020-12-26 (×2): 8 [IU] via SUBCUTANEOUS
  Administered 2020-12-27: 11 [IU] via SUBCUTANEOUS
  Administered 2020-12-27: 2 [IU] via SUBCUTANEOUS
  Administered 2020-12-27 (×2): 3 [IU] via SUBCUTANEOUS
  Administered 2020-12-28: 2 [IU] via SUBCUTANEOUS
  Administered 2020-12-29: 22:00:00 15 [IU] via SUBCUTANEOUS
  Administered 2020-12-29: 17:00:00 3 [IU] via SUBCUTANEOUS
  Administered 2020-12-29 – 2020-12-30 (×2): 5 [IU] via SUBCUTANEOUS
  Filled 2020-12-24: qty 0.15

## 2020-12-24 MED ORDER — TRAZODONE HCL 50 MG PO TABS
50.0000 mg | ORAL_TABLET | Freq: Every evening | ORAL | Status: DC | PRN
  Administered 2020-12-25 – 2020-12-29 (×4): 50 mg via ORAL
  Filled 2020-12-24 (×4): qty 1

## 2020-12-24 MED ORDER — INSULIN ASPART 100 UNIT/ML IJ SOLN
0.0000 [IU] | INTRAMUSCULAR | Status: DC
  Administered 2020-12-24: 2 [IU] via SUBCUTANEOUS
  Filled 2020-12-24: qty 0.15

## 2020-12-24 MED ORDER — LACTATED RINGERS IV SOLN
INTRAVENOUS | Status: AC
  Administered 2020-12-24 (×2): 75 mL via INTRAVENOUS

## 2020-12-24 MED ORDER — SENNOSIDES-DOCUSATE SODIUM 8.6-50 MG PO TABS: 1.0000 | ORAL_TABLET | Freq: Every evening | ORAL | Status: DC | PRN

## 2020-12-24 MED ORDER — INSULIN GLARGINE-YFGN 100 UNIT/ML ~~LOC~~ SOLN
20.0000 [IU] | Freq: Every morning | SUBCUTANEOUS | Status: DC
  Administered 2020-12-24 – 2020-12-26 (×3): 20 [IU] via SUBCUTANEOUS
  Filled 2020-12-24 (×3): qty 0.2

## 2020-12-24 MED ORDER — ROSUVASTATIN CALCIUM 5 MG PO TABS
5.0000 mg | ORAL_TABLET | Freq: Every day | ORAL | Status: DC
  Administered 2020-12-24 – 2020-12-29 (×6): 5 mg via ORAL
  Filled 2020-12-24 (×6): qty 1

## 2020-12-24 MED ORDER — MEMANTINE HCL 10 MG PO TABS
10.0000 mg | ORAL_TABLET | Freq: Two times a day (BID) | ORAL | Status: DC
  Administered 2020-12-24 – 2020-12-30 (×13): 10 mg via ORAL
  Filled 2020-12-24 (×8): qty 1
  Filled 2020-12-24: qty 2
  Filled 2020-12-24 (×4): qty 1

## 2020-12-24 MED ORDER — GUAIFENESIN 100 MG/5ML PO LIQD
5.0000 mL | ORAL | Status: DC | PRN
  Administered 2020-12-24 – 2020-12-30 (×8): 5 mL via ORAL
  Filled 2020-12-24 (×9): qty 10

## 2020-12-24 MED ORDER — ACETAMINOPHEN 10 MG/ML IV SOLN
1000.0000 mg | Freq: Four times a day (QID) | INTRAVENOUS | Status: AC | PRN
Start: 2020-12-24 — End: 2020-12-25
  Administered 2020-12-24: 1000 mg via INTRAVENOUS
  Filled 2020-12-24: qty 100

## 2020-12-24 MED ORDER — TACROLIMUS 0.5 MG PO CAPS
0.5000 mg | ORAL_CAPSULE | Freq: Every day | ORAL | Status: DC
  Administered 2020-12-24 – 2020-12-30 (×7): 0.5 mg via ORAL
  Filled 2020-12-24 (×6): qty 1

## 2020-12-24 MED ORDER — INSULIN ASPART 100 UNIT/ML IJ SOLN
10.0000 [IU] | Freq: Once | INTRAMUSCULAR | Status: AC
  Administered 2020-12-24: 10 [IU] via SUBCUTANEOUS
  Filled 2020-12-24: qty 0.1

## 2020-12-24 MED ORDER — FLUDROCORTISONE ACETATE 0.1 MG PO TABS
0.1000 mg | ORAL_TABLET | Freq: Every day | ORAL | Status: DC
  Administered 2020-12-24 – 2020-12-26 (×3): 0.1 mg via ORAL
  Filled 2020-12-24 (×3): qty 1

## 2020-12-24 NOTE — Progress Notes (Signed)
PHARMACY - PHYSICIAN COMMUNICATION CRITICAL VALUE ALERT - BLOOD CULTURE IDENTIFICATION (BCID)  Emma Maldonado is an 70 y.o. female with hx kidney transplant presented to the ED from nursing facility on 12/23/2020 with a AMS and was found to be febrile.  She was started on ceftriaxone, azithromycin and flagyl on 12/23/20 for sepsis/PNA/UTI.  One of 4 blood culture bottles collected on 12/12 now has GPC in clusters and chains (BCID = staph species and strep species).  Name of physician (or Provider) Contacted: Chinita Greenland (NP)  Current antibiotics: ceftriaxone, azithromycin and flagyl  Changes to prescribed antibiotics recommended:  - suspects organisms in blood culture are contaminant. Continue with current abx for now  Results for orders placed or performed during the hospital encounter of 12/23/20  Blood Culture ID Panel (Reflexed) (Collected: 12/23/2020  7:11 PM)  Result Value Ref Range   Enterococcus faecalis NOT DETECTED NOT DETECTED   Enterococcus Faecium NOT DETECTED NOT DETECTED   Listeria monocytogenes NOT DETECTED NOT DETECTED   Staphylococcus species DETECTED (A) NOT DETECTED   Staphylococcus aureus (BCID) NOT DETECTED NOT DETECTED   Staphylococcus epidermidis NOT DETECTED NOT DETECTED   Staphylococcus lugdunensis NOT DETECTED NOT DETECTED   Streptococcus species DETECTED (A) NOT DETECTED   Streptococcus agalactiae NOT DETECTED NOT DETECTED   Streptococcus pneumoniae NOT DETECTED NOT DETECTED   Streptococcus pyogenes NOT DETECTED NOT DETECTED   A.calcoaceticus-baumannii NOT DETECTED NOT DETECTED   Bacteroides fragilis NOT DETECTED NOT DETECTED   Enterobacterales NOT DETECTED NOT DETECTED   Enterobacter cloacae complex NOT DETECTED NOT DETECTED   Escherichia coli NOT DETECTED NOT DETECTED   Klebsiella aerogenes NOT DETECTED NOT DETECTED   Klebsiella oxytoca NOT DETECTED NOT DETECTED   Klebsiella pneumoniae NOT DETECTED NOT DETECTED   Proteus species NOT DETECTED NOT  DETECTED   Salmonella species NOT DETECTED NOT DETECTED   Serratia marcescens NOT DETECTED NOT DETECTED   Haemophilus influenzae NOT DETECTED NOT DETECTED   Neisseria meningitidis NOT DETECTED NOT DETECTED   Pseudomonas aeruginosa NOT DETECTED NOT DETECTED   Stenotrophomonas maltophilia NOT DETECTED NOT DETECTED   Candida albicans NOT DETECTED NOT DETECTED   Candida auris NOT DETECTED NOT DETECTED   Candida glabrata NOT DETECTED NOT DETECTED   Candida krusei NOT DETECTED NOT DETECTED   Candida parapsilosis NOT DETECTED NOT DETECTED   Candida tropicalis NOT DETECTED NOT DETECTED   Cryptococcus neoformans/gattii NOT DETECTED NOT DETECTED    Lucia Gaskins 12/24/2020  8:43 PM

## 2020-12-24 NOTE — Progress Notes (Signed)
PROGRESS NOTE    Emma Maldonado  EXB:[id number] DOB: [birth year] DOA: 12/23/2020 PCP: Patient, No Pcp Per (Inactive)   Brief Narrative:  70 year old Spanish-speaking female with insulin-dependent diabetes, HTN, HLD status post living donor renal transplant 1997 on chronic immunosuppression, hypothyroidism, orthostatic hypotension on Florinef, advanced Lewy body dementia admitted to the hospital for altered mental status from [facility].  On the day of admission patient was noted to be febrile, confused, weak and having chills.  Upon admission she was in sepsis with tachycardia, febrile, tachypnea.  CT abdomen pelvis showed concerns for proctitis and possible pneumonia.  Patient was started on IV Rocephin, azithromycin and Flagyl along with fluids.   Assessment & Plan:   Principal Problem:   Severe sepsis (HCC) Active Problems:   History of renal transplant   Insulin dependent type 2 diabetes mellitus (HCC)   Dementia (HCC)   Hypothyroidism   Proctitis  Severe sepsis prior to arrival Proctitis, uncomplicated Bibasilar pneumonia - Sepsis protocol started in the ED.  Follow-up culture data. ProCal 13.38 - Empiric antibiotics-IV Rocephin, azithromycin and Flagyl - IV fluids, antipyretics, supportive care - I-S/flutter, as needed bronchodilators -Eventually may need GI consultation-inpatient versus outpatient.  Acute metabolic encephalopathy; improved - Secondary to underlying infection as mentioned above  Hypokalemia - Repletion ordered  Thrombocytopenia - No obvious evidence of infection.  Likely in setting of sepsis.  Continue to monitor platelets  History of living donor renal transplant in 1997 on chronic immunosuppression - On prednisone 5 mg daily, Prograf.  Patient follows with [facility].  Insulin-dependent diabetes mellitus type 2, uncontrolled secondary to hyperglycemia - On sliding scale and Accu-Cheks. Lantus.   Chronic orthostatic  hypotension - On Florinef  Hypothyroidism - IV Synthroid  Lewy body dementia with depression - Supportive care   DVT prophylaxis: SCDs Code Status: Full code Family Communication:  Nancie Neas; updated.    Status is: Inpatient  Remains inpatient appropriate because: Maintain hosp stay for IV Abx, adv diet as tolerated  Pressure Injury 05/01/20 Ankle Anterior;Left;Lateral Deep Tissue Pressure Injury - Purple or maroon localized area of discolored intact skin or blood-filled blister due to damage of underlying soft tissue from pressure and/or shear. DTI left ankle. (Active)  05/01/20 1500  Location: Ankle  Location Orientation: Anterior;Left;Lateral  Staging: Deep Tissue Pressure Injury - Purple or maroon localized area of discolored intact skin or blood-filled blister due to damage of underlying soft tissue from pressure and/or shear.  Wound Description (Comments): DTI left ankle.  Present on Admission: Yes    Subjective: Answers all the basic questions, coughing up mild mucus. Denies any other complaints.    Examination: Constitutional: NAD Respiratory: b/l rhonchi especially at bases.  Cardiovascular: Normal sinus rhythm, no rubs Abdomen: Nontender nondistended good bowel sounds Musculoskeletal: No edema noted Skin: No rashes seen Neurologic: CN 2-12 grossly intact.  And nonfocal Psychiatric: AAox2  Objective: Vitals:   12/24/20 0612 12/24/20 0615 12/24/20 0645 12/24/20 0700  BP:  (!) 100/55 (!) 112/55 (!) 126/55  Pulse:  81    Resp:      Temp: 97.9 F (36.6 C)     TempSrc: Axillary     SpO2:  100%     No intake or output data in the 24 hours ending 12/24/20 0846 There were no vitals filed for this visit.   Data Reviewed:   CBC: Recent Labs  Lab 12/23/20 1906 12/24/20 0610  WBC 10.3 13.6*  NEUTROABS 9.3*  --   HGB 13.4  11.9*  HCT 42.5 36.9  MCV 82.0 80.7  PLT 138* 129*   Basic Metabolic Panel: Recent Labs  Lab 12/23/20 1906 12/24/20 0610   NA 133* 139  K 3.8 2.9*  CL 103 109  CO2 20* 22  GLUCOSE 440* 134*  BUN 18 16  CREATININE 1.07* 0.92  CALCIUM 8.7* 7.9*   GFR: CrCl cannot be calculated (Unknown ideal weight.). Liver Function Tests: Recent Labs  Lab 12/23/20 1906  AST 20  ALT 15  ALKPHOS 73  BILITOT 2.9*  PROT 6.7  ALBUMIN 3.5   No results for input(s): LIPASE, AMYLASE in the last 168 hours. No results for input(s): AMMONIA in the last 168 hours. Coagulation Profile: Recent Labs  Lab 12/23/20 1906  INR 1.1   Cardiac Enzymes: No results for input(s): CKTOTAL, CKMB, CKMBINDEX, TROPONINI in the last 168 hours. BNP (last 3 results) No results for input(s): PROBNP in the last 8760 hours. HbA1C: Recent Labs    12/24/20 0610  HGBA1C 9.6*   CBG: Recent Labs  Lab 12/24/20 0608 12/24/20 0712  GLUCAP 119* 109*   Lipid Profile: No results for input(s): CHOL, HDL, LDLCALC, TRIG, CHOLHDL, LDLDIRECT in the last 72 hours. Thyroid Function Tests: No results for input(s): TSH, T4TOTAL, FREET4, T3FREE, THYROIDAB in the last 72 hours. Anemia Panel: No results for input(s): VITAMINB12, FOLATE, FERRITIN, TIBC, IRON, RETICCTPCT in the last 72 hours. Sepsis Labs: Recent Labs  Lab 12/23/20 1906 12/23/20 2031 12/24/20 0610  PROCALCITON  --   --  13.38  LATICACIDVEN 2.3* 1.8  --     Recent Results (from the past 240 hour(s))  Resp Panel by RT-PCR (Flu A&B, Covid) Nasopharyngeal Swab     Status: None   Collection Time: 12/23/20  6:59 PM   Specimen: Nasopharyngeal Swab; Nasopharyngeal(NP) swabs in vial transport medium  Result Value Ref Range Status   SARS Coronavirus 2 by RT PCR NEGATIVE NEGATIVE Final    Comment: (NOTE) SARS-CoV-2 target nucleic acids are NOT DETECTED.  The SARS-CoV-2 RNA is generally detectable in upper respiratory specimens during the acute phase of infection. The lowest concentration of SARS-CoV-2 viral copies this assay can detect is 138 copies/mL. A negative result does not  preclude SARS-Cov-2 infection and should not be used as the sole basis for treatment or other patient management decisions. A negative result may occur with  improper specimen collection/handling, submission of specimen other than nasopharyngeal swab, presence of viral mutation(s) within the areas targeted by this assay, and inadequate number of viral copies(<138 copies/mL). A negative result must be combined with clinical observations, patient history, and epidemiological information. The expected result is Negative.  Fact Sheet for Patients:  BloggerCourse.com  Fact Sheet for Healthcare Providers:  SeriousBroker.it  This test is no t yet approved or cleared by the [place] FDA and  has been authorized for detection and/or diagnosis of SARS-CoV-2 by FDA under an Emergency Use Authorization (EUA). This EUA will remain  in effect (meaning this test can be used) for the duration of the COVID-19 declaration under Section 564(b)(1) of the Act, 21 U.S.C.section 360bbb-3(b)(1), unless the authorization is terminated  or revoked sooner.       Influenza A by PCR NEGATIVE NEGATIVE Final   Influenza B by PCR NEGATIVE NEGATIVE Final    Comment: (NOTE) The Xpert Xpress SARS-CoV-2/FLU/RSV plus assay is intended as an aid in the diagnosis of influenza from Nasopharyngeal swab specimens and should not be used as a sole basis for treatment. Nasal washings and  aspirates are unacceptable for Xpert Xpress SARS-CoV-2/FLU/RSV testing.  Fact Sheet for Patients: BloggerCourse.com  Fact Sheet for Healthcare Providers: SeriousBroker.it  This test is not yet approved or cleared by the [place] FDA and has been authorized for detection and/or diagnosis of SARS-CoV-2 by FDA under an Emergency Use Authorization (EUA). This EUA will remain in effect (meaning this test can be used) for the  duration of the COVID-19 declaration under Section 564(b)(1) of the Act, 21 U.S.C. section 360bbb-3(b)(1), unless the authorization is terminated or revoked.  Performed at [facility], [address] [place], [place] [address]          Radiology Studies: CT ABDOMEN PELVIS W CONTRAST  Result Date: 12/23/2020 CLINICAL DATA:  Sepsis possible rectal abscess EXAM: CT ABDOMEN AND PELVIS WITH CONTRAST TECHNIQUE: Multidetector CT imaging of the abdomen and pelvis was performed using the standard protocol following bolus administration of intravenous contrast. CONTRAST:  32mL OMNIPAQUE IOHEXOL 350 MG/ML SOLN COMPARISON:  CT 09/09/2020, 05/01/2020 FINDINGS: Lower chest: Lung bases demonstrate new airspace consolidation at the right greater than left lung base with mild bronchial wall thickening, suspicious for pneumonia or aspiration. No pleural effusion. Stable cardiac size. Hepatobiliary: Status post cholecystectomy. No intra hepatic biliary dilatation. Stable prominence of the common bile duct. Pancreas: Atrophic.  No inflammatory changes. Spleen: Normal in size without focal abnormality. Adrenals/Urinary Tract: Adrenal glands are normal. Atrophic native kidneys with vascular calcification. Right lower quadrant transplanted kidney with several small cysts. Mild right hydronephrosis. Mild urothelial thickening and possible narrowing at the ureteral implantation site at the anterior bladder, series 2, image 78. Stomach/Bowel: The stomach is nonenlarged. No dilated small bowel. Diverticular disease of the left colon without acute wall thickening. Circumferential wall thickening of the rectum with mild presacral soft tissue stranding. No organized perirectal abscess. Mild soft tissue infiltration of the gluteal soft tissues but without organized abscess. Vascular/Lymphatic: Moderate aortic atherosclerosis. No aneurysm. No suspicious nodes Reproductive: Status post hysterectomy. No adnexal  masses. Other: Negative for pelvic effusion or free air. Fat fluid level in the subcutaneous fat of the right abdominal wall. Soft tissue infiltration and calcifications of the subcutaneous fat of the anterior abdominal wall as before. Musculoskeletal: No acute osseous abnormality. Healing posterior rib fractures. Healing right first second third and fourth transverse process fractures. IMPRESSION: 1. Circumferential thickening of the rectum with presacral edema and mild soft tissue stranding suggestive of proctitis. No organized perirectal abscess. Correlate with direct inspection to exclude wall thickening secondary to neoplasm/mass. 2. Right lower quadrant transplant kidney with mild right hydronephrosis. Mild urothelial thickening and enhancement of the distal ureter with possible narrowing at implantation site of distal ureter. Similar appearance on CT from April 2022. Suggest correlation with urinalysis to exclude ascending urinary tract infection. 3. Patchy airspace disease at the right greater than left lung base suspicious for pneumonia or aspiration Electronically Signed   By: Jasmine Pang M.D.   On: 12/23/2020 22:25   DG Chest Port 1 View  Result Date: 12/23/2020 CLINICAL DATA:  Multiple status, code sepsis EXAM: PORTABLE CHEST 1 VIEW COMPARISON:  08/22/2020 FINDINGS: Lungs are clear.  No pleural effusion or pneumothorax. The heart is normal in size. IMPRESSION: No evidence of acute cardiopulmonary disease. Electronically Signed   By: Charline Bills M.D.   On: 12/23/2020 19:43        Scheduled Meds:  insulin aspart  0-15 Units Subcutaneous Q4H   [START ON 12/27/2020] levothyroxine  50 mcg Intravenous Daily   sodium  chloride flush  3 mL Intravenous Q12H   Continuous Infusions:  acetaminophen Stopped (12/24/20 0340)   azithromycin Stopped (12/23/20 2253)   cefTRIAXone (ROCEPHIN)  IV Stopped (12/23/20 2148)   lactated ringers 150 mL/hr at 12/23/20 2055   metronidazole Stopped  (12/24/20 0119)     LOS: 1 day   Time spent= 35 mins    Rafferty Postlewait Joline Maxcy, MD [facility]  If 7PM-7AM, please contact night-coverage  12/24/2020, 8:46 AM

## 2020-12-25 ENCOUNTER — Other Ambulatory Visit: Payer: Self-pay

## 2020-12-25 DIAGNOSIS — R6520 Severe sepsis without septic shock: Secondary | ICD-10-CM

## 2020-12-25 DIAGNOSIS — A419 Sepsis, unspecified organism: Secondary | ICD-10-CM | POA: Diagnosis not present

## 2020-12-25 LAB — CBC
HCT: 35.5 % — ABNORMAL LOW (ref 36.0–46.0)
Hemoglobin: 11.6 g/dL — ABNORMAL LOW (ref 12.0–15.0)
MCH: 26.2 pg (ref 26.0–34.0)
MCHC: 32.7 g/dL (ref 30.0–36.0)
MCV: 80.3 fL (ref 80.0–100.0)
Platelets: 115 10*3/uL — ABNORMAL LOW (ref 150–400)
RBC: 4.42 MIL/uL (ref 3.87–5.11)
RDW: 15.4 % (ref 11.5–15.5)
WBC: 12.2 10*3/uL — ABNORMAL HIGH (ref 4.0–10.5)
nRBC: 0 % (ref 0.0–0.2)

## 2020-12-25 LAB — BASIC METABOLIC PANEL
Anion gap: 7 (ref 5–15)
BUN: 9 mg/dL (ref 8–23)
CO2: 23 mmol/L (ref 22–32)
Calcium: 8.4 mg/dL — ABNORMAL LOW (ref 8.9–10.3)
Chloride: 106 mmol/L (ref 98–111)
Creatinine, Ser: 0.66 mg/dL (ref 0.44–1.00)
GFR, Estimated: >60 mL/min (ref >60)
Glucose, Bld: 137 mg/dL — ABNORMAL HIGH (ref 70–99)
Potassium: 3.6 mmol/L (ref 3.5–5.1)
Sodium: 136 mmol/L (ref 135–145)

## 2020-12-25 LAB — MAGNESIUM: Magnesium: 1.4 mg/dL — ABNORMAL LOW (ref 1.7–2.4)

## 2020-12-25 LAB — GLUCOSE, CAPILLARY
Glucose-Capillary: 252 mg/dL — ABNORMAL HIGH (ref 70–99)
Glucose-Capillary: 134 mg/dL — ABNORMAL HIGH (ref 70–99)
Glucose-Capillary: 193 mg/dL — ABNORMAL HIGH (ref 70–99)
Glucose-Capillary: 252 mg/dL — ABNORMAL HIGH (ref 70–99)
Glucose-Capillary: 325 mg/dL — ABNORMAL HIGH (ref 70–99)

## 2020-12-25 MED ORDER — ENOXAPARIN SODIUM 30 MG/0.3ML IJ SOSY: 30.0000 mg | PREFILLED_SYRINGE | INTRAMUSCULAR | Status: DC

## 2020-12-25 MED ORDER — LEVOTHYROXINE SODIUM 100 MCG PO TABS
100.0000 ug | ORAL_TABLET | Freq: Every day | ORAL | Status: DC
Start: 2020-12-26 — End: 2020-12-30
  Administered 2020-12-26 – 2020-12-30 (×5): 100 ug via ORAL
  Filled 2020-12-25 (×5): qty 1

## 2020-12-25 MED ORDER — MAGNESIUM SULFATE 2 GM/50ML IV SOLN
2.0000 g | Freq: Once | INTRAVENOUS | Status: AC
  Administered 2020-12-25: 07:00:00 2 g via INTRAVENOUS
  Filled 2020-12-25: qty 50

## 2020-12-25 MED ORDER — METRONIDAZOLE 500 MG PO TABS
500.0000 mg | ORAL_TABLET | Freq: Two times a day (BID) | ORAL | Status: DC
  Administered 2020-12-25 – 2020-12-27 (×4): 500 mg via ORAL
  Filled 2020-12-25 (×4): qty 1

## 2020-12-25 NOTE — Progress Notes (Signed)
Bright red blood noticed on assessment from rectum. MD paged. Charge nurse notified.

## 2020-12-25 NOTE — Consult Note (Signed)
[facility]    Date of Admission:  12/23/2020     Total days of antibiotics 3         Reason for Consult: Bacteremia    Referring Provider: Dr. Jomarie Longs Primary Care Provider: Patient, No Pcp Per (Inactive)   ASSESSMENT:  Emma Maldonado is a 70 y/o female admitted from her nursing home with altered mental status and severe sepsis with concern for possible proctitis and blood cultures positive for Staphylococcus Hominis and 1 bottle positive for Streptococcus mitis. Both blood cultures were taken from the right antecubital fossa which does leave open the possibility that these may be contaminants. I think further evaluation of proctitis is needed as I am not overly convinced this is infectious which is also supported by negative gonorrhea/chlamydia testing. Agree with GI evaluation which will defer to primary team. Improving with current antibiotics and recommend continuing ceftriaxone and metronidazole for now. Does not appear to have pneumonia and will discontinue azithromycin. Will recheck blood cultures and anticipate a short course of treatment pending any additional findings.  Remaining medical and supportive care per primary team.   PLAN:  Continue current dose of ceftriaxone and metronidazole.  Discontinue azithromycin. Repeat blood cultures.  Recommend follow up/consult with GI for further evaluation Remaining medical and supportive care per primary team.    Principal Problem:   Severe sepsis (HCC) Active Problems:   History of renal transplant   Insulin dependent type 2 diabetes mellitus (HCC)   Dementia (HCC)   Hypothyroidism   Proctitis    enoxaparin (LOVENOX) injection  30 mg Subcutaneous Q24H   fludrocortisone  0.1 mg Oral QHS   insulin aspart  0-15 Units Subcutaneous TID AC & HS   insulin glargine-yfgn  20 Units Subcutaneous q AM   [START ON 12/27/2020] levothyroxine  50 mcg Intravenous Daily   memantine  10 mg Oral Q12H   predniSONE  5  mg Oral Q breakfast   QUEtiapine  25 mg Oral QHS   rosuvastatin  5 mg Oral QHS   sodium chloride flush  3 mL Intravenous Q12H   tacrolimus  0.5 mg Oral Daily     HPI: Emma Maldonado is a 70 y.o. female with previous medical history of Type 1 diabetes, hypertension, and s/p living donor renal transplant in 1997 on chronic immunosuppression, orthostatic hypotension on Florinef admitted from her nursing home with altered mental status.    Emma Maldonado has tachycardic, febrile and tachypnic on arrival. Max temperature was 103.8 F. Chest x-ray with no evidence of cardiopulmonary disease. CT abdomen/pelvis with circumfrential thickening of the recturm with pre-sacral edema and mild soft tissue stranding suggestive of proctitis without perirectal abscess; and mild urothelial thickening and enhancement of distal ureter with suggestion to correlate with UA to exclude UTI; and patchy airspace disease at the right greater than left lung base suspicious for pneumonia or aspiration. Blood culture was positive for Staphyloccus hominis in 3/4 bottles and streptococcus mistis/oralis in 2/4 bottles. Rectal swab with gram positive rods and gram negative rods on gram stain and culture with few multiple organisms present.   Emma Maldonado has been afebrile over the past 24 hours. Initially started on Ceftriaxone, azithromycin and metronidazole. Currently on Day 3 of antimicrobial therapy. Rectal swab for gonorrhea and chlamyida were negative. Complaining of abdominal pain and cough with no other symptoms.   Review of Systems: Review of Systems  Constitutional:  Negative for chills, fever and weight loss.  Respiratory:  Positive for cough. Negative for shortness of breath and wheezing.   Cardiovascular:  Negative for chest pain and leg swelling.  Gastrointestinal:  Positive for abdominal pain. Negative for blood in stool, constipation, diarrhea, nausea and vomiting.  Skin:  Negative for rash.    Past Medical History:   Diagnosis Date   Dementia (HCC)    Diabetes mellitus without complication (HCC)    Hypertension    Renal disorder    Renal insufficiency     Social History   Tobacco Use   Smoking status: Never   Smokeless tobacco: Never  Substance Use Topics   Alcohol use: No   Drug use: No    Family History  Problem Relation Age of Onset   Diabetes Mellitus II Father     No Known Allergies  OBJECTIVE: Blood pressure (!) 149/66, pulse 82, temperature 98 F (36.7 C), temperature source Oral, resp. rate 20, height 5\' 2"  (1.575 m), weight 66.3 kg, SpO2 100 %.  Physical Exam Constitutional:      General: She is not in acute distress.    Appearance: She is well-developed.  Cardiovascular:     Rate and Rhythm: Normal rate and regular rhythm.     Heart sounds: Normal heart sounds.  Pulmonary:     Effort: Pulmonary effort is normal.     Breath sounds: Normal breath sounds.  Abdominal:     General: Bowel sounds are normal. There is no distension.     Palpations: Abdomen is soft. There is no fluid wave.     Tenderness: There is generalized abdominal tenderness. There is no guarding or rebound. Negative signs include McBurney's sign.  Skin:    General: Skin is warm and dry.  Neurological:     Mental Status: She is alert. She is disoriented.    Lab Results Lab Results  Component Value Date   WBC 12.2 (H) 12/25/2020   HGB 11.6 (L) 12/25/2020   HCT 35.5 (L) 12/25/2020   MCV 80.3 12/25/2020   PLT 115 (L) 12/25/2020    Lab Results  Component Value Date   CREATININE 0.66 12/25/2020   BUN 9 12/25/2020   NA 136 12/25/2020   K 3.6 12/25/2020   CL 106 12/25/2020   CO2 23 12/25/2020    Lab Results  Component Value Date   ALT 15 12/23/2020   AST 20 12/23/2020   ALKPHOS 73 12/23/2020   BILITOT 2.9 (H) 12/23/2020     Microbiology: Recent Results (from the past 240 hour(s))  Urine Culture     Status: None (Preliminary result)   Collection Time: 12/23/20  2:30 AM   Specimen:  Urine, Clean Catch  Result Value Ref Range Status   Specimen Description   Final    URINE, CLEAN CATCH Performed at [facility], [address], [place], [place]    Special Requests   Final    NONE Performed at [facility], [address], [place], [place]    Culture   Final    CULTURE REINCUBATED FOR BETTER GROWTH Performed at [facility], [address], [place], [place]    Report Status PENDING  Incomplete  Resp Panel by RT-PCR (Flu A&B, Covid) Nasopharyngeal Swab     Status: None   Collection Time: 12/23/20  6:59 PM   Specimen: Nasopharyngeal Swab; Nasopharyngeal(NP) swabs in vial transport medium  Result Value Ref Range Status   SARS Coronavirus 2 by RT PCR NEGATIVE NEGATIVE Final  Comment: (NOTE) SARS-CoV-2 target nucleic acids are NOT DETECTED.  The SARS-CoV-2 RNA is generally detectable in upper respiratory specimens during the acute phase of infection. The lowest concentration of SARS-CoV-2 viral copies this assay can detect is 138 copies/mL. A negative result does not preclude SARS-Cov-2 infection and should not be used as the sole basis for treatment or other patient management decisions. A negative result may occur with  improper specimen collection/handling, submission of specimen other than nasopharyngeal swab, presence of viral mutation(s) within the areas targeted by this assay, and inadequate number of viral copies(<138 copies/mL). A negative result must be combined with clinical observations, patient history, and epidemiological information. The expected result is Negative.  Fact Sheet for Patients:  BloggerCourse.com  Fact Sheet for Healthcare Providers:  SeriousBroker.it  This test is no t yet approved or cleared by the [place] FDA and  has been authorized for detection and/or diagnosis of SARS-CoV-2 by FDA under an  Emergency Use Authorization (EUA). This EUA will remain  in effect (meaning this test can be used) for the duration of the COVID-19 declaration under Section 564(b)(1) of the Act, 21 U.S.C.section 360bbb-3(b)(1), unless the authorization is terminated  or revoked sooner.       Influenza A by PCR NEGATIVE NEGATIVE Final   Influenza B by PCR NEGATIVE NEGATIVE Final    Comment: (NOTE) The Xpert Xpress SARS-CoV-2/FLU/RSV plus assay is intended as an aid in the diagnosis of influenza from Nasopharyngeal swab specimens and should not be used as a sole basis for treatment. Nasal washings and aspirates are unacceptable for Xpert Xpress SARS-CoV-2/FLU/RSV testing.  Fact Sheet for Patients: BloggerCourse.com  Fact Sheet for Healthcare Providers: SeriousBroker.it  This test is not yet approved or cleared by the [place] FDA and has been authorized for detection and/or diagnosis of SARS-CoV-2 by FDA under an Emergency Use Authorization (EUA). This EUA will remain in effect (meaning this test can be used) for the duration of the COVID-19 declaration under Section 564(b)(1) of the Act, 21 U.S.C. section 360bbb-3(b)(1), unless the authorization is terminated or revoked.  Performed at [facility], [address] [place], [place] [address]   Blood Culture (routine x 2)     Status: Abnormal (Preliminary result)   Collection Time: 12/23/20  7:06 PM   Specimen: BLOOD  Result Value Ref Range Status   Specimen Description   Final    BLOOD RIGHT ANTECUBITAL Performed at [facility], [address] [place], [place] [address]    Special Requests   Final    BOTTLES DRAWN AEROBIC AND ANAEROBIC Blood Culture results may not be optimal due to an inadequate volume of blood received in culture bottles Performed at [facility], [address] [place], [place] [address]    Culture  Setup  Time   Final    GRAM POSITIVE COCCI AEROBIC BOTTLE ONLY CRITICAL VALUE NOTED.  VALUE IS CONSISTENT WITH PREVIOUSLY REPORTED AND CALLED VALUE.    Culture (A)  Final    STAPHYLOCOCCUS HOMINIS CULTURE REINCUBATED FOR BETTER GROWTH Performed at [facility], [address], [place], [place] [address]    Report Status PENDING  Incomplete  Blood Culture (routine x 2)     Status: Abnormal (Preliminary result)   Collection Time: 12/23/20  7:11 PM   Specimen: BLOOD  Result Value Ref Range Status   Specimen Description   Final    BLOOD RIGHT ANTECUBITAL Performed at [facility], [address] [place], [place] [address]    Special Requests   Final    BOTTLES DRAWN AEROBIC AND ANAEROBIC Blood Culture results may not be optimal due to an inadequate volume of blood received in culture bottles Performed at [facility], [address], [place], [place] [address]    Culture  Setup Time   Final    GRAM POSITIVE COCCI IN CHAINS IN CLUSTERS IN BOTH AEROBIC AND ANAEROBIC BOTTLES CRITICAL RESULT CALLED TO, READ BACK BY AND VERIFIED WITH: PHARMD ANH PHAM 12/24/20 @2035  BY JW    Culture (A)  Final    STREPTOCOCCUS MITIS/ORALIS STAPHYLOCOCCUS HOMINIS CULTURE REINCUBATED FOR BETTER GROWTH Performed at [facility], [address], [place], [place]    Report Status PENDING  Incomplete  Blood Culture ID Panel (Reflexed)     Status: Abnormal   Collection Time: 12/23/20  7:11 PM  Result Value Ref Range Status   Enterococcus faecalis NOT DETECTED NOT DETECTED Final   Enterococcus Faecium NOT DETECTED NOT DETECTED Final   Listeria monocytogenes NOT DETECTED NOT DETECTED Final   Staphylococcus species DETECTED (A) NOT DETECTED Final    Comment: CRITICAL RESULT CALLED TO, READ BACK BY AND VERIFIED WITH: PHARMD ANH PHAM 12/24/20 @2035  BY JW    Staphylococcus aureus (BCID) NOT DETECTED NOT DETECTED Final   Staphylococcus epidermidis NOT  DETECTED NOT DETECTED Final   Staphylococcus lugdunensis NOT DETECTED NOT DETECTED Final   Streptococcus species DETECTED (A) NOT DETECTED Final    Comment: Not Enterococcus species, Streptococcus agalactiae, Streptococcus pyogenes, or Streptococcus pneumoniae. CRITICAL RESULT CALLED TO, READ BACK BY AND VERIFIED WITH: PHARMD ANH PHAM 12/24/20 @2035  BY JW    Streptococcus agalactiae NOT DETECTED NOT DETECTED Final   Streptococcus pneumoniae NOT DETECTED NOT DETECTED Final   Streptococcus pyogenes NOT DETECTED NOT DETECTED Final   A.calcoaceticus-baumannii NOT DETECTED NOT DETECTED Final   Bacteroides fragilis NOT DETECTED NOT DETECTED Final   Enterobacterales NOT DETECTED NOT DETECTED Final   Enterobacter cloacae complex NOT DETECTED NOT DETECTED Final   Escherichia coli NOT DETECTED NOT DETECTED Final   Klebsiella aerogenes NOT DETECTED NOT DETECTED Final   Klebsiella oxytoca NOT DETECTED NOT DETECTED Final   Klebsiella pneumoniae NOT DETECTED NOT DETECTED Final   Proteus species NOT DETECTED NOT DETECTED Final   Salmonella species NOT DETECTED NOT DETECTED Final   Serratia marcescens NOT DETECTED NOT DETECTED Final   Haemophilus influenzae NOT DETECTED NOT DETECTED Final   Neisseria meningitidis NOT DETECTED NOT DETECTED Final   Pseudomonas aeruginosa NOT DETECTED NOT DETECTED Final   Stenotrophomonas maltophilia NOT DETECTED NOT DETECTED Final   Candida albicans NOT DETECTED NOT DETECTED Final   Candida auris NOT DETECTED NOT DETECTED Final   Candida glabrata NOT DETECTED NOT DETECTED Final   Candida krusei NOT DETECTED NOT DETECTED Final   Candida parapsilosis NOT DETECTED NOT DETECTED Final   Candida tropicalis NOT DETECTED NOT DETECTED Final   Cryptococcus neoformans/gattii NOT DETECTED NOT DETECTED Final    Comment: Performed at [facility], [address], [place], [facility] [address]  Aerobic Culture w Gram Stain (superficial specimen)     Status: None (Preliminary  result)   Collection Time: 12/23/20  9:34 PM   Specimen: RECTAL SWAB  Result Value Ref Range Status   Specimen Description   Final    RECTAL SWAB Performed at [facility], [address], [place], [place]    Special Requests   Final  NONE Performed at [facility], [address], [place], [place] [address]    Gram Stain   Final    RARE WBC PRESENT,BOTH PMN AND MONONUCLEAR FEW GRAM POSITIVE COCCI IN PAIRS RARE GRAM POSITIVE RODS FEW GRAM NEGATIVE RODS Performed at [facility], [address], [place], [place] [address]    Culture FEW MULTIPLE ORGANISMS PRESENT, NONE PREDOMINANT  Final   Report Status PENDING  Incomplete     Marcos Eke, NP [facility]  12/25/2020  11:33 AM

## 2020-12-25 NOTE — TOC Initial Note (Signed)
Transition of Care [facility]) - Initial/Assessment Note    Patient Details  Name: Emma Maldonado MRN: [id number] Date of Birth: 1950-09-30  [facility]) CM/SW Contact:    Ida Rogue, LCSW Phone Number: 12/25/2020, 3:51 PM  Clinical Narrative:    Patient si from [facility]. TOC will continue to follow during the course of hospitalization.                 Expected Discharge Plan: Assisted Living Barriers to Discharge: No Barriers Identified   Patient Goals and CMS Choice        Expected Discharge Plan and Services Expected Discharge Plan: Assisted Living                                              Prior Living Arrangements/Services                       Activities of Daily Living Home Assistive Devices/Equipment: Other (Comment) (unknown) ADL Screening (condition at time of admission) Patient's cognitive ability adequate to safely complete daily activities?: No Is the patient deaf or have difficulty hearing?: No Does the patient have difficulty seeing, even when wearing glasses/contacts?: No Does the patient have difficulty concentrating, remembering, or making decisions?: Yes Patient able to express need for assistance with ADLs?: No Does the patient have difficulty dressing or bathing?: Yes Independently performs ADLs?: No Communication: Independent Dressing (OT): Needs assistance Is this a change from baseline?: Pre-admission baseline Grooming: Needs assistance Is this a change from baseline?: Pre-admission baseline Does the patient have difficulty walking or climbing stairs?: Yes Weakness of Legs: Both Weakness of Arms/Hands: None  Permission Sought/Granted                  Emotional Assessment              Admission diagnosis:  Lower urinary tract infectious disease [N39.0] Proctitis [K62.89] Severe sepsis (HCC) [A41.9, R65.20] Patient Active Problem List   Diagnosis Date Noted   Severe  sepsis (HCC) 12/23/2020   Hypothyroidism 12/23/2020   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   Hypomagnesemia    Acute renal failure (HCC)    Hypotension 08/22/2020   Hydronephrosis of right kidney 05/01/2020   Rib fractures 05/01/2020   UTI (urinary tract infection) 03/25/2020   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   Hypokalemia 03/25/2020   Essential hypertension 03/25/2020   Dementia (HCC) 03/25/2020   Altered mental status 03/25/2020   Uncontrolled type 2 diabetes mellitus with hyperglycemia (HCC) 03/25/2020   Accelerated hypertension 03/28/2012   Viral gastroenteritis 03/28/2012   Sepsis (HCC) 03/26/2012   History of renal transplant 03/26/2012   Diabetes mellitus (HCC) 03/26/2012   Hyperlipidemia 03/26/2012   PCP:  Patient, No Pcp Per (Inactive) Pharmacy:   [facility] [place], [place] - [address] [place] [place] [address] Phone: [phone] Fax: [phone]     Social Determinants of Health (SDOH) Interventions    Readmission Risk Interventions No flowsheet data found.

## 2020-12-25 NOTE — Plan of Care (Signed)
?  Problem: Elimination: ?Goal: Will not experience complications related to urinary retention ?Outcome: Progressing ?  ?

## 2020-12-25 NOTE — Progress Notes (Addendum)
PROGRESS NOTE    Emma Maldonado  EXH:[id number] DOB: 08/26/1950 DOA: 12/23/2020 PCP: Patient, No Pcp Per (Inactive)   Brief Narrative:  70 year old Spanish-speaking female with insulin-dependent diabetes, HTN, HLD status post living donor renal transplant 1997 on chronic immunosuppression, hypothyroidism, orthostatic hypotension on Florinef, advanced Lewy body dementia admitted to the hospital for altered mental status from [facility].  On the day of admission patient was noted to be febrile to 103, confused, weak and having chills.  Upon admission she was in sepsis with tachycardia, febrile, tachypnea.  CT abdomen pelvis showed concerns for proctitis and lower lobe pneumonia.  Patient was started on IV Rocephin, azithromycin and Flagyl along with fluids. -Blood cultures with staph hominis in both bottles and strep mitis X1  Assessment & Plan:  Severe sepsis prior to arrival Staph hominis bacteremia 2/2, strep mitis -1 bottle Proctitis, uncomplicated Bibasilar pneumonia -Clinically improving, sepsis physiology has resolved, continue IV ceftriaxone, azithromycin and Flagyl  -She does report productive cough and congestion, denies any abdominal/ rectal symptoms -Will request infectious disease input -She will benefit from GI follow-up and sigmoidoscopy as outpatient  Acute metabolic encephalopathy; improved - Secondary to underlying infection as mentioned above  Hypokalemia - Repleted  Thrombocytopenia - No obvious evidence of infection.  Likely in setting of sepsis.  Continue to monitor  History of living donor renal transplant in 1997 on chronic immunosuppression - On prednisone 5 mg daily, Prograf.  Patient follows with [facility].  Insulin-dependent diabetes mellitus type 2, uncontrolled secondary to hyperglycemia - CBGs are stable, continue Lantus, SSI  Chronic orthostatic hypotension - On Florinef  Hypothyroidism - Continue Synthroid  Lewy  body dementia with depression - Supportive care   DVT prophylaxis:  hold Lovenox for thombocytopenia Code Status: Full code Family Communication: No family at bedside, will update daughter  Status is: Inpatient  Remains inpatient appropriate because: Maintain hosp stay for IV Abx, adv diet as tolerated  Pressure Injury 05/01/20 Ankle Anterior;Left;Lateral Deep Tissue Pressure Injury - Purple or maroon localized area of discolored intact skin or blood-filled blister due to damage of underlying soft tissue from pressure and/or shear. DTI left ankle. (Active)  05/01/20 1500  Location: Ankle  Location Orientation: Anterior;Left;Lateral  Staging: Deep Tissue Pressure Injury - Purple or maroon localized area of discolored intact skin or blood-filled blister due to damage of underlying soft tissue from pressure and/or shear.  Wound Description (Comments): DTI left ankle.  Present on Admission: Yes    Subjective: -Reports productive cough and some congestion, denies any abdominal pain, denies any rectal symptoms   Examination: Gen: Awake, Alert, Oriented X 2, mild cognitive deficits, communicated using Spanish interpreter HEENT: no JVD Lungs: Few scattered basilar rhonchi, otherwise clear CVS: S1S2/RRR Abd: soft, Non tender, non distended, BS present Extremities: No edema Skin: no new rashes on exposed skin  Neuro: Moves all extremities, no localizing signs  Objective: Vitals:   12/24/20 2143 12/25/20 0016 12/25/20 0333 12/25/20 0930  BP: (!) 142/75 132/82 (!) 162/75 (!) 149/66  Pulse: 96 94 83 82  Resp: 20 20 20 20   Temp: 99.4 F (37.4 C) 98.7 F (37.1 C) 99.1 F (37.3 C) 98 F (36.7 C)  TempSrc: Axillary Axillary Oral Oral  SpO2: 97% 98% 100% 100%  Weight: 66.3 kg     Height:        Intake/Output Summary (Last 24 hours) at 12/25/2020 1040 Last data filed at 12/25/2020 0333 Gross per 24 hour  Intake 1284.27 ml  Output 350 ml  Net 934.27 ml   Filed Weights    12/24/20 1434 12/24/20 2143  Weight: 70 kg 66.3 kg     Data Reviewed:   CBC: Recent Labs  Lab 12/23/20 1906 12/24/20 0610 12/25/20 0517  WBC 10.3 13.6* 12.2*  NEUTROABS 9.3*  --   --   HGB 13.4 11.9* 11.6*  HCT 42.5 36.9 35.5*  MCV 82.0 80.7 80.3  PLT 138* 129* 115*   Basic Metabolic Panel: Recent Labs  Lab 12/23/20 1906 12/24/20 0610 12/25/20 0517  NA 133* 139 136  K 3.8 2.9* 3.6  CL 103 109 106  CO2 20* 22 23  GLUCOSE 440* 134* 137*  BUN 18 16 9   CREATININE 1.07* 0.92 0.66  CALCIUM 8.7* 7.9* 8.4*  MG  --   --  1.4*   GFR: Estimated Creatinine Clearance: 57.6 mL/min (by C-G formula based on SCr of 0.66 mg/dL). Liver Function Tests: Recent Labs  Lab 12/23/20 1906  AST 20  ALT 15  ALKPHOS 73  BILITOT 2.9*  PROT 6.7  ALBUMIN 3.5   No results for input(s): LIPASE, AMYLASE in the last 168 hours. No results for input(s): AMMONIA in the last 168 hours. Coagulation Profile: Recent Labs  Lab 12/23/20 1906  INR 1.1   Cardiac Enzymes: No results for input(s): CKTOTAL, CKMB, CKMBINDEX, TROPONINI in the last 168 hours. BNP (last 3 results) No results for input(s): PROBNP in the last 8760 hours. HbA1C: Recent Labs    12/24/20 0610  HGBA1C 9.6*   CBG: Recent Labs  Lab 12/24/20 0712 12/24/20 1145 12/24/20 1645 12/24/20 2201 12/25/20 0743  GLUCAP 109* 125* 125* 252* 134*   Lipid Profile: No results for input(s): CHOL, HDL, LDLCALC, TRIG, CHOLHDL, LDLDIRECT in the last 72 hours. Thyroid Function Tests: No results for input(s): TSH, T4TOTAL, FREET4, T3FREE, THYROIDAB in the last 72 hours. Anemia Panel: No results for input(s): VITAMINB12, FOLATE, FERRITIN, TIBC, IRON, RETICCTPCT in the last 72 hours. Sepsis Labs: Recent Labs  Lab 12/23/20 1906 12/23/20 2031 12/24/20 0610  PROCALCITON  --   --  13.38  LATICACIDVEN 2.3* 1.8  --     Recent Results (from the past 240 hour(s))  Urine Culture     Status: None (Preliminary result)   Collection  Time: 12/23/20  2:30 AM   Specimen: Urine, Clean Catch  Result Value Ref Range Status   Specimen Description   Final    URINE, CLEAN CATCH Performed at [facility], [address], [place], [place]    Special Requests   Final    NONE Performed at [facility], [address], [place], [place]    Culture   Final    CULTURE REINCUBATED FOR BETTER GROWTH Performed at [facility], [address], [place], [place]    Report Status PENDING  Incomplete  Resp Panel by RT-PCR (Flu A&B, Covid) Nasopharyngeal Swab     Status: None   Collection Time: 12/23/20  6:59 PM   Specimen: Nasopharyngeal Swab; Nasopharyngeal(NP) swabs in vial transport medium  Result Value Ref Range Status   SARS Coronavirus 2 by RT PCR NEGATIVE NEGATIVE Final    Comment: (NOTE) SARS-CoV-2 target nucleic acids are NOT DETECTED.  The SARS-CoV-2 RNA is generally detectable in upper respiratory specimens during the acute phase of infection. The lowest concentration of SARS-CoV-2 viral copies this assay can detect is 138 copies/mL. A negative result does not preclude SARS-Cov-2 infection and should  not be used as the sole basis for treatment or other patient management decisions. A negative result may occur with  improper specimen collection/handling, submission of specimen other than nasopharyngeal swab, presence of viral mutation(s) within the areas targeted by this assay, and inadequate number of viral copies(<138 copies/mL). A negative result must be combined with clinical observations, patient history, and epidemiological information. The expected result is Negative.  Fact Sheet for Patients:  BloggerCourse.com  Fact Sheet for Healthcare Providers:  SeriousBroker.it  This test is no t yet approved or cleared by the [place] FDA and  has been authorized for detection and/or diagnosis  of SARS-CoV-2 by FDA under an Emergency Use Authorization (EUA). This EUA will remain  in effect (meaning this test can be used) for the duration of the COVID-19 declaration under Section 564(b)(1) of the Act, 21 U.S.C.section 360bbb-3(b)(1), unless the authorization is terminated  or revoked sooner.       Influenza A by PCR NEGATIVE NEGATIVE Final   Influenza B by PCR NEGATIVE NEGATIVE Final    Comment: (NOTE) The Xpert Xpress SARS-CoV-2/FLU/RSV plus assay is intended as an aid in the diagnosis of influenza from Nasopharyngeal swab specimens and should not be used as a sole basis for treatment. Nasal washings and aspirates are unacceptable for Xpert Xpress SARS-CoV-2/FLU/RSV testing.  Fact Sheet for Patients: BloggerCourse.com  Fact Sheet for Healthcare Providers: SeriousBroker.it  This test is not yet approved or cleared by the [place] FDA and has been authorized for detection and/or diagnosis of SARS-CoV-2 by FDA under an Emergency Use Authorization (EUA). This EUA will remain in effect (meaning this test can be used) for the duration of the COVID-19 declaration under Section 564(b)(1) of the Act, 21 U.S.C. section 360bbb-3(b)(1), unless the authorization is terminated or revoked.  Performed at [facility], [address], [place], [place] [address]   Blood Culture (routine x 2)     Status: Abnormal (Preliminary result)   Collection Time: 12/23/20  7:06 PM   Specimen: BLOOD  Result Value Ref Range Status   Specimen Description   Final    BLOOD RIGHT ANTECUBITAL Performed at [facility], [address], [place], [place] [address]    Special Requests   Final    BOTTLES DRAWN AEROBIC AND ANAEROBIC Blood Culture results may not be optimal due to an inadequate volume of blood received in culture bottles Performed at [facility], [address],  [place], [place] [address]    Culture  Setup Time   Final    GRAM POSITIVE COCCI AEROBIC BOTTLE ONLY CRITICAL VALUE NOTED.  VALUE IS CONSISTENT WITH PREVIOUSLY REPORTED AND CALLED VALUE.    Culture (A)  Final    STAPHYLOCOCCUS HOMINIS CULTURE REINCUBATED FOR BETTER GROWTH Performed at [facility], [address], [place], [place] [address]    Report Status PENDING  Incomplete  Blood Culture (routine x 2)     Status: Abnormal (Preliminary result)   Collection Time: 12/23/20  7:11 PM   Specimen: BLOOD  Result Value Ref Range Status   Specimen Description   Final    BLOOD RIGHT ANTECUBITAL Performed at [facility], [address] [place], [place] [address]    Special Requests   Final    BOTTLES DRAWN AEROBIC AND ANAEROBIC Blood Culture results may not be optimal due to an inadequate volume of blood received in culture bottles Performed at [facility], [address] [place],  [place] [address]    Culture  Setup Time   Final    GRAM POSITIVE COCCI IN CHAINS IN CLUSTERS IN BOTH AEROBIC AND ANAEROBIC BOTTLES CRITICAL RESULT CALLED TO, READ BACK BY AND VERIFIED WITH: PHARMD ANH PHAM 12/24/20 @2035  BY JW    Culture (A)  Final    STREPTOCOCCUS MITIS/ORALIS STAPHYLOCOCCUS HOMINIS CULTURE REINCUBATED FOR BETTER GROWTH Performed at [facility], [address], [place], [place]    Report Status PENDING  Incomplete  Blood Culture ID Panel (Reflexed)     Status: Abnormal   Collection Time: 12/23/20  7:11 PM  Result Value Ref Range Status   Enterococcus faecalis NOT DETECTED NOT DETECTED Final   Enterococcus Faecium NOT DETECTED NOT DETECTED Final   Listeria monocytogenes NOT DETECTED NOT DETECTED Final   Staphylococcus species DETECTED (A) NOT DETECTED Final    Comment: CRITICAL RESULT CALLED TO, READ BACK BY AND VERIFIED WITH: PHARMD ANH PHAM 12/24/20 @2035  BY JW    Staphylococcus aureus (BCID) NOT DETECTED NOT DETECTED Final    Staphylococcus epidermidis NOT DETECTED NOT DETECTED Final   Staphylococcus lugdunensis NOT DETECTED NOT DETECTED Final   Streptococcus species DETECTED (A) NOT DETECTED Final    Comment: Not Enterococcus species, Streptococcus agalactiae, Streptococcus pyogenes, or Streptococcus pneumoniae. CRITICAL RESULT CALLED TO, READ BACK BY AND VERIFIED WITH: PHARMD ANH PHAM 12/24/20 @2035  BY JW    Streptococcus agalactiae NOT DETECTED NOT DETECTED Final   Streptococcus pneumoniae NOT DETECTED NOT DETECTED Final   Streptococcus pyogenes NOT DETECTED NOT DETECTED Final   A.calcoaceticus-baumannii NOT DETECTED NOT DETECTED Final   Bacteroides fragilis NOT DETECTED NOT DETECTED Final   Enterobacterales NOT DETECTED NOT DETECTED Final   Enterobacter cloacae complex NOT DETECTED NOT DETECTED Final   Escherichia coli NOT DETECTED NOT DETECTED Final   Klebsiella aerogenes NOT DETECTED NOT DETECTED Final   Klebsiella oxytoca NOT DETECTED NOT DETECTED Final   Klebsiella pneumoniae NOT DETECTED NOT DETECTED Final   Proteus species NOT DETECTED NOT DETECTED Final   Salmonella species NOT DETECTED NOT DETECTED Final   Serratia marcescens NOT DETECTED NOT DETECTED Final   Haemophilus influenzae NOT DETECTED NOT DETECTED Final   Neisseria meningitidis NOT DETECTED NOT DETECTED Final   Pseudomonas aeruginosa NOT DETECTED NOT DETECTED Final   Stenotrophomonas maltophilia NOT DETECTED NOT DETECTED Final   Candida albicans NOT DETECTED NOT DETECTED Final   Candida auris NOT DETECTED NOT DETECTED Final   Candida glabrata NOT DETECTED NOT DETECTED Final   Candida krusei NOT DETECTED NOT DETECTED Final   Candida parapsilosis NOT DETECTED NOT DETECTED Final   Candida tropicalis NOT DETECTED NOT DETECTED Final   Cryptococcus neoformans/gattii NOT DETECTED NOT DETECTED Final    Comment: Performed at [facility], [address], [place], [facility] [address]  Aerobic Culture w Gram Stain (superficial specimen)      Status: None (Preliminary result)   Collection Time: 12/23/20  9:34 PM   Specimen: RECTAL SWAB  Result Value Ref Range Status   Specimen Description   Final    RECTAL SWAB Performed at [facility], [address] [place], [place]    Special Requests   Final    NONE Performed at [facility], [address] [place], [place]    Gram Stain   Final    RARE WBC PRESENT,BOTH PMN AND MONONUCLEAR FEW GRAM POSITIVE COCCI IN PAIRS RARE GRAM POSITIVE RODS FEW GRAM NEGATIVE RODS Performed at [facility], [address] [place], [place] [address]    Culture FEW MULTIPLE ORGANISMS PRESENT, NONE PREDOMINANT  Final   Report Status PENDING  Incomplete         Radiology Studies: CT ABDOMEN PELVIS W CONTRAST  Result Date: 12/23/2020 CLINICAL DATA:  Sepsis possible rectal abscess EXAM: CT ABDOMEN AND PELVIS WITH CONTRAST TECHNIQUE: Multidetector CT imaging of the abdomen and pelvis was performed using the standard protocol following bolus administration of intravenous contrast. CONTRAST:  80mL OMNIPAQUE IOHEXOL 350 MG/ML SOLN COMPARISON:  CT 09/09/2020, 05/01/2020 FINDINGS: Lower chest: Lung bases demonstrate new airspace consolidation at the right greater than left lung base with mild bronchial wall thickening, suspicious for pneumonia or aspiration. No pleural effusion. Stable cardiac size. Hepatobiliary: Status post cholecystectomy. No intra hepatic biliary dilatation. Stable prominence of the common bile duct. Pancreas: Atrophic.  No inflammatory changes. Spleen: Normal in size without focal abnormality. Adrenals/Urinary Tract: Adrenal glands are normal. Atrophic native kidneys with vascular calcification. Right lower quadrant transplanted kidney with several small cysts. Mild right hydronephrosis. Mild urothelial thickening and possible narrowing at the ureteral implantation site at the anterior bladder, series 2, image 78.  Stomach/Bowel: The stomach is nonenlarged. No dilated small bowel. Diverticular disease of the left colon without acute wall thickening. Circumferential wall thickening of the rectum with mild presacral soft tissue stranding. No organized perirectal abscess. Mild soft tissue infiltration of the gluteal soft tissues but without organized abscess. Vascular/Lymphatic: Moderate aortic atherosclerosis. No aneurysm. No suspicious nodes Reproductive: Status post hysterectomy. No adnexal masses. Other: Negative for pelvic effusion or free air. Fat fluid level in the subcutaneous fat of the right abdominal wall. Soft tissue infiltration and calcifications of the subcutaneous fat of the anterior abdominal wall as before. Musculoskeletal: No acute osseous abnormality. Healing posterior rib fractures. Healing right first second third and fourth transverse process fractures. IMPRESSION: 1. Circumferential thickening of the rectum with presacral edema and mild soft tissue stranding suggestive of proctitis. No organized perirectal abscess. Correlate with direct inspection to exclude wall thickening secondary to neoplasm/mass. 2. Right lower quadrant transplant kidney with mild right hydronephrosis. Mild urothelial thickening and enhancement of the distal ureter with possible narrowing at implantation site of distal ureter. Similar appearance on CT from April 2022. Suggest correlation with urinalysis to exclude ascending urinary tract infection. 3. Patchy airspace disease at the right greater than left lung base suspicious for pneumonia or aspiration Electronically Signed   By: Jasmine Pang M.D.   On: 12/23/2020 22:25   DG Chest Port 1 View  Result Date: 12/23/2020 CLINICAL DATA:  Multiple status, code sepsis EXAM: PORTABLE CHEST 1 VIEW COMPARISON:  08/22/2020 FINDINGS: Lungs are clear.  No pleural effusion or pneumothorax. The heart is normal in size. IMPRESSION: No evidence of acute cardiopulmonary disease. Electronically  Signed   By: Charline Bills M.D.   On: 12/23/2020 19:43        Scheduled Meds:  fludrocortisone  0.1 mg Oral QHS   insulin aspart  0-15 Units Subcutaneous TID AC & HS   insulin glargine-yfgn  20 Units Subcutaneous q AM   [START ON 12/27/2020] levothyroxine  50 mcg Intravenous Daily   memantine  10 mg Oral Q12H   predniSONE  5 mg Oral Q breakfast   QUEtiapine  25 mg Oral QHS   rosuvastatin  5 mg Oral QHS   sodium chloride flush  3 mL Intravenous Q12H   tacrolimus  0.5 mg Oral Daily   Continuous Infusions:  azithromycin  500 mg (12/24/20 2116)   cefTRIAXone (ROCEPHIN)  IV 2 g (12/24/20 2117)   metronidazole 500 mg (12/24/20 2249)     LOS: 2 days   Time spent-75min   Zannie Cove, MD [facility]  If 7PM-7AM, please contact night-coverage  12/25/2020, 10:40 AM

## 2020-12-26 ENCOUNTER — Encounter (HOSPITAL_COMMUNITY): Payer: Self-pay | Admitting: Internal Medicine

## 2020-12-26 DIAGNOSIS — Z940 Kidney transplant status: Secondary | ICD-10-CM

## 2020-12-26 DIAGNOSIS — E119 Type 2 diabetes mellitus without complications: Secondary | ICD-10-CM

## 2020-12-26 DIAGNOSIS — Z794 Long term (current) use of insulin: Secondary | ICD-10-CM

## 2020-12-26 DIAGNOSIS — K6289 Other specified diseases of anus and rectum: Secondary | ICD-10-CM | POA: Diagnosis not present

## 2020-12-26 DIAGNOSIS — R7881 Bacteremia: Secondary | ICD-10-CM

## 2020-12-26 DIAGNOSIS — A419 Sepsis, unspecified organism: Secondary | ICD-10-CM | POA: Diagnosis not present

## 2020-12-26 DIAGNOSIS — F02C0 Dementia in other diseases classified elsewhere, severe, without behavioral disturbance, psychotic disturbance, mood disturbance, and anxiety: Secondary | ICD-10-CM

## 2020-12-26 DIAGNOSIS — G309 Alzheimer's disease, unspecified: Secondary | ICD-10-CM

## 2020-12-26 LAB — AEROBIC CULTURE W GRAM STAIN (SUPERFICIAL SPECIMEN)

## 2020-12-26 LAB — URINE CULTURE: Culture: >=100000 — AB

## 2020-12-26 LAB — BASIC METABOLIC PANEL
Anion gap: 7 (ref 5–15)
BUN: 9 mg/dL (ref 8–23)
CO2: 24 mmol/L (ref 22–32)
Calcium: 8.4 mg/dL — ABNORMAL LOW (ref 8.9–10.3)
Chloride: 110 mmol/L (ref 98–111)
Creatinine, Ser: 0.62 mg/dL (ref 0.44–1.00)
GFR, Estimated: >60 mL/min (ref >60)
Glucose, Bld: 58 mg/dL — ABNORMAL LOW (ref 70–99)
Potassium: 3 mmol/L — ABNORMAL LOW (ref 3.5–5.1)
Sodium: 141 mmol/L (ref 135–145)

## 2020-12-26 LAB — CBC
HCT: 35.6 % — ABNORMAL LOW (ref 36.0–46.0)
Hemoglobin: 11.3 g/dL — ABNORMAL LOW (ref 12.0–15.0)
MCH: 25.9 pg — ABNORMAL LOW (ref 26.0–34.0)
MCHC: 31.7 g/dL (ref 30.0–36.0)
MCV: 81.7 fL (ref 80.0–100.0)
Platelets: 131 10*3/uL — ABNORMAL LOW (ref 150–400)
RBC: 4.36 MIL/uL (ref 3.87–5.11)
RDW: 15.5 % (ref 11.5–15.5)
WBC: 10 10*3/uL (ref 4.0–10.5)
nRBC: 0 % (ref 0.0–0.2)

## 2020-12-26 LAB — GLUCOSE, CAPILLARY
Glucose-Capillary: 86 mg/dL (ref 70–99)
Glucose-Capillary: 125 mg/dL — ABNORMAL HIGH (ref 70–99)
Glucose-Capillary: 257 mg/dL — ABNORMAL HIGH (ref 70–99)
Glucose-Capillary: 270 mg/dL — ABNORMAL HIGH (ref 70–99)

## 2020-12-26 MED ORDER — INSULIN GLARGINE-YFGN 100 UNIT/ML ~~LOC~~ SOLN
18.0000 [IU] | Freq: Every morning | SUBCUTANEOUS | Status: DC
Start: 2020-12-27 — End: 2020-12-30
  Administered 2020-12-27 – 2020-12-30 (×4): 18 [IU] via SUBCUTANEOUS
  Filled 2020-12-26 (×4): qty 0.18

## 2020-12-26 MED ORDER — POTASSIUM CHLORIDE CRYS ER 20 MEQ PO TBCR
40.0000 meq | EXTENDED_RELEASE_TABLET | Freq: Once | ORAL | Status: AC
  Administered 2020-12-26: 40 meq via ORAL
  Filled 2020-12-26: qty 2

## 2020-12-26 MED ORDER — GLUCERNA SHAKE PO LIQD
237.0000 mL | Freq: Three times a day (TID) | ORAL | Status: DC
  Administered 2020-12-26 – 2020-12-30 (×9): 237 mL via ORAL
  Filled 2020-12-26 (×13): qty 237

## 2020-12-26 MED ORDER — ADULT MULTIVITAMIN W/MINERALS CH
1.0000 | ORAL_TABLET | Freq: Every day | ORAL | Status: DC
  Administered 2020-12-26 – 2020-12-30 (×5): 1 via ORAL
  Filled 2020-12-26 (×5): qty 1

## 2020-12-26 NOTE — Consult Note (Signed)
Referring Provider: [facility] Primary Care Physician:  Patient, No Pcp Per (Inactive) Primary Gastroenterologist:  Gentry Fitz  Reason for Consultation:  Proctitis, BRBPR  HPI: Emma Maldonado is a 70 y.o. female a Spanish-speaking 70 y.o. female with medical history significant for insulin-dependent T2DM, HTN, HLD, s/p living donor renal transplant in 1997 on chronic immunosuppression, hypothyroidism, orthostatic hypotension on Florinef, and advanced Lewy body dementia presents for proctitis and BRBPR.  Patient originally presented to ED 12/12 for altered mental status.  She was found to be confused, weak, and having rigors.  Currently on Rocephin and Flagyl for bacteremia. In ED, t max 103.8 rectal, bp stable, mildly tachy at 110.  Unable to obtain history from patient.  Daughter is not in the room, daughter's numbers not on file.  Will attempt to obtain.  Majority of history obtained through chart review.  Last colonoscopy in 2013, record unavailable.   Past Medical History:  Diagnosis Date   Dementia (HCC)    Diabetes mellitus without complication (HCC)    Hypertension    Renal disorder    Renal insufficiency     Past Surgical History:  Procedure Laterality Date   ABDOMINAL HYSTERECTOMY     CHOLECYSTECTOMY     NEPHRECTOMY TRANSPLANTED ORGAN      Prior to Admission medications   Medication Sig Start Date End Date Taking? Authorizing Provider  acetaminophen (TYLENOL) 500 MG tablet Take 500 mg by mouth every 6 (six) hours as needed for fever or headache.   Yes [provider]  alum & mag hydroxide-simeth (MAALOX/MYLANTA) 200-200-20 MG/5ML suspension Take 30 mLs by mouth every 6 (six) hours as needed for indigestion or heartburn.   Yes [provider]  Cholecalciferol (VITAMIN D3) 50 MCG (2000 UT) TABS Take 2,000 Units by mouth daily.   Yes [provider]  fludrocortisone (FLORINEF) 0.1 MG tablet Take 0.1 mg by mouth at bedtime.    Yes [provider]  guaifenesin (ROBITUSSIN) 100 MG/5ML syrup Take 200 mg by mouth every 6 (six) hours as needed for cough.   Yes [provider]  insulin aspart (NOVOLOG) 100 UNIT/ML injection Inject 20 Units into the skin See admin instructions. Inject 10 units into the skin three times a day after meals and hold if not eating   Yes [provider]  LANTUS SOLOSTAR 100 UNIT/ML Solostar Pen Inject 20 Units into the skin in the morning. 08/26/20  Yes [name], MD  levothyroxine (SYNTHROID) 100 MCG tablet Take 100 mcg by mouth daily before breakfast. 03/04/20  Yes [provider]  loperamide (IMODIUM) 2 MG capsule Take 2 mg by mouth as needed (with each loose stool for diarrhea- cannot exceed 8 doses a day). 02/20/20  Yes [provider]  magnesium hydroxide (MILK OF MAGNESIA) 400 MG/5ML suspension Take 30 mLs by mouth at bedtime as needed for mild constipation.   Yes [provider]  memantine (NAMENDA) 10 MG tablet Take 10 mg by mouth every 12 (twelve) hours.   Yes [provider]  Multiple Vitamin (MULTIVITAMIN WITH MINERALS) TABS tablet Take 1 tablet by mouth daily. 08/26/20  Yes [name], MD  neomycin-bacitracin-polymyxin (NEOSPORIN) [phone] ointment Apply 1 application topically as needed (for minor skin tears or abrasions, after cleaning with normal saline- bandage with gauze and tape).   Yes [provider]  predniSONE (DELTASONE) 5 MG tablet Take 5 mg by mouth every morning.   Yes [provider]  QUEtiapine (SEROQUEL) 25 MG tablet Take 25  mg by mouth at bedtime.   Yes [provider]  rosuvastatin (CRESTOR) 5 MG tablet Take 5 mg by mouth at bedtime. 12/01/19  Yes [provider]  tacrolimus (PROGRAF) 0.5 MG capsule Take 1 capsule (0.5 mg total) by mouth daily. 03/28/12  Yes Dhungel, Nishant, MD    Scheduled Meds:  fludrocortisone  0.1 mg Oral QHS   insulin aspart  0-15 Units Subcutaneous TID AC &  HS   [START ON 12/27/2020] insulin glargine-yfgn  18 Units Subcutaneous q AM   levothyroxine  100 mcg Oral Q0600   memantine  10 mg Oral Q12H   metroNIDAZOLE  500 mg Oral Q12H   predniSONE  5 mg Oral Q breakfast   QUEtiapine  25 mg Oral QHS   rosuvastatin  5 mg Oral QHS   sodium chloride flush  3 mL Intravenous Q12H   tacrolimus  0.5 mg Oral Daily   Continuous Infusions:  cefTRIAXone (ROCEPHIN)  IV 2 g (12/25/20 2059)   PRN Meds:.guaiFENesin, hydrALAZINE, ipratropium-albuterol, metoprolol tartrate, ondansetron **OR** ondansetron (ZOFRAN) IV, senna-docusate, traZODone  Allergies as of 12/23/2020   (No Known Allergies)    Family History  Problem Relation Age of Onset   Diabetes Mellitus II Father    Colon cancer Father    Colon cancer Brother     Social History   Socioeconomic History   Marital status: Married    Spouse name: Not on file   Number of children: Not on file   Years of education: Not on file   Highest education level: Not on file  Occupational History   Not on file  Tobacco Use   Smoking status: Never   Smokeless tobacco: Never  Substance and Sexual Activity   Alcohol use: No   Drug use: No   Sexual activity: Not on file  Other Topics Concern   Not on file  Social History Narrative   Not on file   Social Determinants of Health   Financial Resource Strain: Not on file  Food Insecurity: Not on file  Transportation Needs: Not on file  Physical Activity: Not on file  Stress: Not on file  Social Connections: Not on file  Intimate Partner Violence: Not on file    Review of Systems: Review of Systems  Unable to perform ROS: Dementia    Physical Exam:Physical Exam Constitutional:      Appearance: Normal appearance.  HENT:     Head: Normocephalic and atraumatic.     Nose: Nose normal. No congestion.     Mouth/Throat:     Mouth: Mucous membranes are moist.     Pharynx: Oropharynx is clear.  Eyes:     Extraocular Movements: Extraocular  movements intact.     Conjunctiva/sclera: Conjunctivae normal.  Cardiovascular:     Rate and Rhythm: Normal rate and regular rhythm.  Pulmonary:     Effort: Pulmonary effort is normal. No respiratory distress.  Abdominal:     General: Abdomen is flat. Bowel sounds are normal. There is no distension.     Palpations: Abdomen is soft. There is no mass.     Tenderness: There is no abdominal tenderness. There is no guarding or rebound.     Hernia: No hernia is present.  Musculoskeletal:        General: No swelling. Normal range of motion.     Cervical back: Normal range of motion and neck supple.  Skin:    General: Skin is warm and dry.  Neurological:  Mental Status: She is alert. Mental status is at baseline. She is disoriented.  Psychiatric:        Mood and Affect: Mood normal.        Behavior: Behavior normal.        Thought Content: Thought content normal.        Judgment: Judgment normal.    Vital signs: Vitals:   12/25/20 2003 12/26/20 0400  BP: (!) 165/84 (!) 147/68  Pulse: 86 93  Resp: 20   Temp: 98.4 F (36.9 C) 98.7 F (37.1 C)  SpO2: 97% 92%   Last BM Date: 12/26/20    GI:  Lab Results: Recent Labs    12/24/20 0610 12/25/20 0517 12/26/20 0523  WBC 13.6* 12.2* 10.0  HGB 11.9* 11.6* 11.3*  HCT 36.9 35.5* 35.6*  PLT 129* 115* 131*   BMET Recent Labs    12/24/20 0610 12/25/20 0517 12/26/20 0523  NA 139 136 141  K 2.9* 3.6 3.0*  CL 109 106 110  CO2 22 23 24   GLUCOSE 134* 137* 58*  BUN 16 9 9   CREATININE 0.92 0.66 0.62  CALCIUM 7.9* 8.4* 8.4*   LFT Recent Labs    12/23/20 1906  PROT 6.7  ALBUMIN 3.5  AST 20  ALT 15  ALKPHOS 73  BILITOT 2.9*   PT/INR Recent Labs    12/23/20 1906  LABPROT 14.5  INR 1.1     Studies/Results: No results found.  Impression: Proctitis, BRBPR -Hgb 11.3, (decreased from 13.4 12/12), MCV normal -Platelets 131 - BUN 9, creatinine 0.62 -Potassium 3.0 -CT abdomen pelvis with contrast 12/12:  Circumferential thickening of rectum with presacral edema, stranding suggestive of proctitis.  No organized perirectal abscess.  Bacteremia -Receiving Flagyl and Rocephin -Improvement in leukocytosis, now 10.0  Plan: Unable to do flexible sigmoidoscopy tomorrow due to schedule. Continue antibiotics via infectious disease Will continue to try to get in contact with daughter about steps moving forward. [facility] will follow  LOS: 3 days   Hever Castilleja 14/[birth year]/22  PA-C 12/26/2020, 10:26 AM  Contact #  [phone]

## 2020-12-26 NOTE — Progress Notes (Signed)
Inpatient Diabetes Program Recommendations  AACE/ADA: New Consensus Statement on Inpatient Glycemic Control (2015)  Target Ranges:  Prepandial:   less than 140 mg/dL      Peak postprandial:   less than 180 mg/dL (1-2 hours)      Critically ill patients:  140 - 180 mg/dL   Lab Results  Component Value Date   GLUCAP 86 12/26/2020   HGBA1C 9.6 (H) 12/24/2020    Review of Glycemic Control  Latest Reference Range & Units 12/25/20 07:43 12/25/20 11:45 12/25/20 16:54 12/25/20 20:06 12/26/20 07:54  Glucose-Capillary 70 - 99 mg/dL 628 (H) 366 (H) 294 (H) 325 (H) 86   Diabetes history: DM 2 Outpatient Diabetes medications: Lantus 20, Novolog 10 units tid, pred 5 mg Daily  Current orders for Inpatient glycemic control:  Semglee 20 units Daily Novolog 0-15 units tid and hs prednisone 5 mg Daily  Inpatient Diabetes Program Recommendations:    Fasting 86 this am. Glucose trends increase after meal intake and prednisone dose  -  Reduce Semglee to 18 units -  Consider adding Novolog 3 units tid meal coverage if eating >50% of meals  Thanks,  Christena Deem RN, MSN, BC-ADM Inpatient Diabetes Coordinator Team Pager [phone] (8a-5p)

## 2020-12-26 NOTE — Progress Notes (Signed)
PROGRESS NOTE    Emma Maldonado  WUJ:[id number] DOB: 03/07/1950 DOA: 12/23/2020 PCP: Patient, No Pcp Per (Inactive)     Brief Narrative:  Emma Maldonado is a 70 year old Spanish-speaking female with insulin-dependent diabetes, HTN, HLD, status post living donor renal transplant 1997 on chronic immunosuppression, hypothyroidism, orthostatic hypotension on Florinef, advanced Lewy body dementia resides at memory care unit, who was admitted to the hospital for altered mental status.  On the day of admission, patient was noted to be febrile to 103F, confused, weak and having chills.  Upon admission she was in sepsis with tachycardia, febrile, tachypnea.  CT abdomen pelvis showed concerns for proctitis and lower lobe pneumonia.  Patient was started on IV Rocephin, azithromycin and Flagyl along with fluids. Blood cultures with staph hominis in both bottles and strep mitis. ID consulted.   New events last 24 hours / Subjective: Patient evaluated with iPad interpreter.  Due to her baseline dementia, review of systems is difficult to gather.  She admitted to cough, but denied any pain.  Discussed with daughter over the phone, she states that patient's dementia has been worsening, usually does not complain of anything.  Patient apparently has family history of colon cancer specifically in her father and brother.  Her last colonoscopy on record was at [facility] in 2013.  Daughter would like to pursue GI work-up during this hospitalization as it is more difficult to get to appointments from her memory care unit.  Assessment & Plan:   Principal Problem:   Severe sepsis (HCC) Active Problems:   History of renal transplant   Insulin dependent type 2 diabetes mellitus (HCC)   Dementia (HCC)   Hypothyroidism   Proctitis   Severe sepsis, POA, secondary to staph hominis and strep mitis bacteremia, possibly from proctitis -Infectious disease following, remains on Rocephin and Flagyl -GI  consulted today -Repeat blood cultures obtained today 12/15  Acute metabolic encephalopathy on chronic Lewy body dementia -Secondary to above, seems to be at her baseline now -Continue Namenda, Seroquel  History of living donor renal transplant 1997 -Continue prednisone, Prograf  Diabetes mellitus type 2, hyperglycemia -Continue Semglee, sliding scale insulin.  Reduced Semglee dose due to hypoglycemia today  Chronic orthostatic hypotension -Continue Florinef  Hyperlipidemia -Continue Crestor  Hypothyroidism -Continue Synthroid  Hypokalemia -Replace, trend  DVT prophylaxis:  SCDs Start: 12/24/20 0136  Code Status: Full code Family Communication: Daughter over the phone Disposition Plan:  Status is: Inpatient  Remains inpatient appropriate because: IV antibiotics.  GI consultation      Consultants:  ID GI   Antimicrobials:  Anti-infectives (From admission, onward)    Start     Dose/Rate Route Frequency Ordered Stop   12/25/20 2200  metroNIDAZOLE (FLAGYL) tablet 500 mg        500 mg Oral Every 12 hours 12/25/20 1420     12/23/20 2300  metroNIDAZOLE (FLAGYL) IVPB 500 mg  Status:  Discontinued        500 mg 100 mL/hr over 60 Minutes Intravenous Every 12 hours 12/23/20 2245 12/25/20 1420   12/23/20 2045  cefTRIAXone (ROCEPHIN) 2 g in sodium chloride 0.9 % 100 mL IVPB        2 g 200 mL/hr over 30 Minutes Intravenous Every 24 hours 12/23/20 2040 12/28/20 2044   12/23/20 2045  azithromycin (ZITHROMAX) 500 mg in sodium chloride 0.9 % 250 mL IVPB  Status:  Discontinued        500 mg 250 mL/hr over 60 Minutes Intravenous Every  24 hours 12/23/20 2040 12/25/20 1420        Objective: Vitals:   12/25/20 0930 12/25/20 1300 12/25/20 2003 12/26/20 0400  BP: (!) 149/66 (!) 150/76 (!) 165/84 (!) 147/68  Pulse: 82 84 86 93  Resp: Temp: 98 F (36.7 C) 98.2 F (36.8 C) 98.4 F (36.9 C) 98.7 F (37.1 C)  TempSrc: Oral Oral Axillary Axillary  SpO2: 100%  100% 97% 92%  Weight:      Height:        Intake/Output Summary (Last 24 hours) at 12/26/2020 1042 Last data filed at 12/26/2020 0600 Gross per 24 hour  Intake 1683.78 ml  Output 950 ml  Net 733.78 ml   Filed Weights   12/24/20 1434 12/24/20 2143  Weight: 70 kg 66.3 kg    Examination:  General exam: Appears calm and comfortable  Respiratory system: Clear to auscultation anteriorly. Respiratory effort normal. No respiratory distress.  Cardiovascular system: S1 & S2 heard, RRR. No murmurs.  Gastrointestinal system: Abdomen is nondistended, soft and nontender.  Central nervous system: Alert  Extremities: Symmetric in appearance  Skin: No rashes, lesions or ulcers on exposed skin  Psychiatry: Judgement and insight appear poor overall  Data Reviewed: I have personally reviewed following labs and imaging studies  CBC: Recent Labs  Lab 12/23/20 1906 12/24/20 0610 12/25/20 0517 12/26/20 0523  WBC 10.3 13.6* 12.2* 10.0  NEUTROABS 9.3*  --   --   --   HGB 13.4 11.9* 11.6* 11.3*  HCT 42.5 36.9 35.5* 35.6*  MCV 82.0 80.7 80.3 81.7  PLT 138* 129* 115* 131*   Basic Metabolic Panel: Recent Labs  Lab 12/23/20 1906 12/24/20 0610 12/25/20 0517 12/26/20 0523  NA 133* 139 136 141  K 3.8 2.9* 3.6 3.0*  CL 103 109 106 110  CO2 20* GLUCOSE 440* 134* 137* 58*  BUN CREATININE 1.07* 0.92 0.66 0.62  CALCIUM 8.7* 7.9* 8.4* 8.4*  MG  --   --  1.4*  --    GFR: Estimated Creatinine Clearance: 57.6 mL/min (by C-G formula based on SCr of 0.62 mg/dL). Liver Function Tests: Recent Labs  Lab 12/23/20 1906  AST 20  ALT 15  ALKPHOS 73  BILITOT 2.9*  PROT 6.7  ALBUMIN 3.5   No results for input(s): LIPASE, AMYLASE in the last 168 hours. No results for input(s): AMMONIA in the last 168 hours. Coagulation Profile: Recent Labs  Lab 12/23/20 1906  INR 1.1   Cardiac Enzymes: No results for input(s): CKTOTAL, CKMB, CKMBINDEX, TROPONINI in the last 168  hours. BNP (last 3 results) No results for input(s): PROBNP in the last 8760 hours. HbA1C: Recent Labs    12/24/20 0610  HGBA1C 9.6*   CBG: Recent Labs  Lab 12/25/20 0743 12/25/20 1145 12/25/20 1654 12/25/20 2006 12/26/20 0754  GLUCAP 134* 193* 252* 325* 86   Lipid Profile: No results for input(s): CHOL, HDL, LDLCALC, TRIG, CHOLHDL, LDLDIRECT in the last 72 hours. Thyroid Function Tests: No results for input(s): TSH, T4TOTAL, FREET4, T3FREE, THYROIDAB in the last 72 hours. Anemia Panel: No results for input(s): VITAMINB12, FOLATE, FERRITIN, TIBC, IRON, RETICCTPCT in the last 72 hours. Sepsis Labs: Recent Labs  Lab 12/23/20 1906 12/23/20 2031 12/24/20 0610  PROCALCITON  --   --  13.38  LATICACIDVEN 2.3* 1.8  --     Recent Results (from the past 240 hour(s))  Urine Culture     Status: Abnormal  Collection Time: 12/23/20  2:30 AM   Specimen: Urine, Clean Catch  Result Value Ref Range Status   Specimen Description   Final    URINE, CLEAN CATCH Performed at [facility], [address] [place], [place] [address]    Special Requests   Final    NONE Performed at [facility], [address] [place], [place] [address]    Culture >=100,000 COLONIES/mL AEROCOCCUS URINAE (A)  Final   Report Status 12/26/2020 FINAL  Final  Resp Panel by RT-PCR (Flu A&B, Covid) Nasopharyngeal Swab     Status: None   Collection Time: 12/23/20  6:59 PM   Specimen: Nasopharyngeal Swab; Nasopharyngeal(NP) swabs in vial transport medium  Result Value Ref Range Status   SARS Coronavirus 2 by RT PCR NEGATIVE NEGATIVE Final    Comment: (NOTE) SARS-CoV-2 target nucleic acids are NOT DETECTED.  The SARS-CoV-2 RNA is generally detectable in upper respiratory specimens during the acute phase of infection. The lowest concentration of SARS-CoV-2 viral copies this assay can detect is 138 copies/mL. A negative result does not preclude SARS-Cov-2 infection and  should not be used as the sole basis for treatment or other patient management decisions. A negative result may occur with  improper specimen collection/handling, submission of specimen other than nasopharyngeal swab, presence of viral mutation(s) within the areas targeted by this assay, and inadequate number of viral copies(<138 copies/mL). A negative result must be combined with clinical observations, patient history, and epidemiological information. The expected result is Negative.  Fact Sheet for Patients:  BloggerCourse.com  Fact Sheet for Healthcare Providers:  SeriousBroker.it  This test is no t yet approved or cleared by the [place] FDA and  has been authorized for detection and/or diagnosis of SARS-CoV-2 by FDA under an Emergency Use Authorization (EUA). This EUA will remain  in effect (meaning this test can be used) for the duration of the COVID-19 declaration under Section 564(b)(1) of the Act, 21 U.S.C.section 360bbb-3(b)(1), unless the authorization is terminated  or revoked sooner.       Influenza A by PCR NEGATIVE NEGATIVE Final   Influenza B by PCR NEGATIVE NEGATIVE Final    Comment: (NOTE) The Xpert Xpress SARS-CoV-2/FLU/RSV plus assay is intended as an aid in the diagnosis of influenza from Nasopharyngeal swab specimens and should not be used as a sole basis for treatment. Nasal washings and aspirates are unacceptable for Xpert Xpress SARS-CoV-2/FLU/RSV testing.  Fact Sheet for Patients: BloggerCourse.com  Fact Sheet for Healthcare Providers: SeriousBroker.it  This test is not yet approved or cleared by the [place] FDA and has been authorized for detection and/or diagnosis of SARS-CoV-2 by FDA under an Emergency Use Authorization (EUA). This EUA will remain in effect (meaning this test can be used) for the duration of the COVID-19 declaration  under Section 564(b)(1) of the Act, 21 U.S.C. section 360bbb-3(b)(1), unless the authorization is terminated or revoked.  Performed at [facility], [address] [place], [place] [address]   Blood Culture (routine x 2)     Status: Abnormal (Preliminary result)   Collection Time: 12/23/20  7:06 PM   Specimen: BLOOD  Result Value Ref Range Status   Specimen Description   Final    BLOOD RIGHT ANTECUBITAL Performed at [facility], [address] [place], [place] [address]    Special Requests   Final    BOTTLES DRAWN AEROBIC AND ANAEROBIC Blood Culture results may not be optimal due to an inadequate  volume of blood received in culture bottles Performed at [facility], [address] [place], [place] [address]    Culture  Setup Time   Final    GRAM POSITIVE COCCI AEROBIC BOTTLE ONLY CRITICAL VALUE NOTED.  VALUE IS CONSISTENT WITH PREVIOUSLY REPORTED AND CALLED VALUE.    Culture (A)  Final    STAPHYLOCOCCUS HOMINIS CULTURE REINCUBATED FOR BETTER GROWTH Performed at [facility], [address], [place], [place] [address]    Report Status PENDING  Incomplete  Blood Culture (routine x 2)     Status: Abnormal (Preliminary result)   Collection Time: 12/23/20  7:11 PM   Specimen: BLOOD  Result Value Ref Range Status   Specimen Description   Final    BLOOD RIGHT ANTECUBITAL Performed at [facility] [address] [place], [place] [address]    Special Requests   Final    BOTTLES DRAWN AEROBIC AND ANAEROBIC Blood Culture results may not be optimal due to an inadequate volume of blood received in culture bottles Performed at [facility], [address], [place], [place] [address]    Culture  Setup Time   Final    GRAM POSITIVE COCCI IN CHAINS IN CLUSTERS IN BOTH AEROBIC AND ANAEROBIC BOTTLES CRITICAL RESULT CALLED TO, READ BACK BY AND VERIFIED WITH: PHARMD ANH PHAM 12/24/20  @2035  BY JW    Culture (A)  Final    STREPTOCOCCUS MITIS/ORALIS STAPHYLOCOCCUS HOMINIS SUSCEPTIBILITIES TO FOLLOW Performed at [facility], [address] [place], [place]    Report Status PENDING  Incomplete  Blood Culture ID Panel (Reflexed)     Status: Abnormal   Collection Time: 12/23/20  7:11 PM  Result Value Ref Range Status   Enterococcus faecalis NOT DETECTED NOT DETECTED Final   Enterococcus Faecium NOT DETECTED NOT DETECTED Final   Listeria monocytogenes NOT DETECTED NOT DETECTED Final   Staphylococcus species DETECTED (A) NOT DETECTED Final    Comment: CRITICAL RESULT CALLED TO, READ BACK BY AND VERIFIED WITH: PHARMD ANH PHAM 12/24/20 @2035  BY JW    Staphylococcus aureus (BCID) NOT DETECTED NOT DETECTED Final   Staphylococcus epidermidis NOT DETECTED NOT DETECTED Final   Staphylococcus lugdunensis NOT DETECTED NOT DETECTED Final   Streptococcus species DETECTED (A) NOT DETECTED Final    Comment: Not Enterococcus species, Streptococcus agalactiae, Streptococcus pyogenes, or Streptococcus pneumoniae. CRITICAL RESULT CALLED TO, READ BACK BY AND VERIFIED WITH: PHARMD ANH PHAM 12/24/20 @2035  BY JW    Streptococcus agalactiae NOT DETECTED NOT DETECTED Final   Streptococcus pneumoniae NOT DETECTED NOT DETECTED Final   Streptococcus pyogenes NOT DETECTED NOT DETECTED Final   A.calcoaceticus-baumannii NOT DETECTED NOT DETECTED Final   Bacteroides fragilis NOT DETECTED NOT DETECTED Final   Enterobacterales NOT DETECTED NOT DETECTED Final   Enterobacter cloacae complex NOT DETECTED NOT DETECTED Final   Escherichia coli NOT DETECTED NOT DETECTED Final   Klebsiella aerogenes NOT DETECTED NOT DETECTED Final   Klebsiella oxytoca NOT DETECTED NOT DETECTED Final   Klebsiella pneumoniae NOT DETECTED NOT DETECTED Final   Proteus species NOT DETECTED NOT DETECTED Final   Salmonella species NOT DETECTED NOT DETECTED Final   Serratia marcescens NOT DETECTED NOT DETECTED  Final   Haemophilus influenzae NOT DETECTED NOT DETECTED Final   Neisseria meningitidis NOT DETECTED NOT DETECTED Final   Pseudomonas aeruginosa NOT DETECTED NOT DETECTED Final   Stenotrophomonas maltophilia NOT DETECTED NOT DETECTED Final   Candida albicans NOT DETECTED NOT DETECTED  Final   Candida auris NOT DETECTED NOT DETECTED Final   Candida glabrata NOT DETECTED NOT DETECTED Final   Candida krusei NOT DETECTED NOT DETECTED Final   Candida parapsilosis NOT DETECTED NOT DETECTED Final   Candida tropicalis NOT DETECTED NOT DETECTED Final   Cryptococcus neoformans/gattii NOT DETECTED NOT DETECTED Final    Comment: Performed at [facility], [address], [place], [place] [address]  Aerobic Culture w Gram Stain (superficial specimen)     Status: None (Preliminary result)   Collection Time: 12/23/20  9:34 PM   Specimen: RECTAL SWAB  Result Value Ref Range Status   Specimen Description   Final    RECTAL SWAB Performed at [facility], [address], [place], [place] [address]    Special Requests   Final    NONE Performed at [facility], [address], [place], [place] [address]    Gram Stain   Final    RARE WBC PRESENT,BOTH PMN AND MONONUCLEAR FEW GRAM POSITIVE COCCI IN PAIRS RARE GRAM POSITIVE RODS FEW GRAM NEGATIVE RODS Performed at [facility], [address], [place], [place] [address]    Culture FEW MULTIPLE ORGANISMS PRESENT, NONE PREDOMINANT  Final   Report Status PENDING  Incomplete      Radiology Studies: No results found.    Scheduled Meds:  fludrocortisone  0.1 mg Oral QHS   insulin aspart  0-15 Units Subcutaneous TID AC & HS   [START ON 12/27/2020] insulin glargine-yfgn  18 Units Subcutaneous q AM   levothyroxine  100 mcg Oral Q0600   memantine  10 mg Oral Q12H   metroNIDAZOLE  500 mg Oral Q12H   predniSONE  5 mg Oral Q breakfast   QUEtiapine  25 mg Oral QHS   rosuvastatin  5 mg Oral QHS   sodium  chloride flush  3 mL Intravenous Q12H   tacrolimus  0.5 mg Oral Daily   Continuous Infusions:  cefTRIAXone (ROCEPHIN)  IV 2 g (12/25/20 2059)     LOS: 3 days      Time spent: 35 minutes   Noralee Stain, DO [facility] 12/26/2020, 10:42 AM   Available via Epic secure chat 7am-7pm After these hours, please refer to coverage provider listed on [email or web address]

## 2020-12-26 NOTE — Progress Notes (Signed)
Initial Nutrition Assessment  INTERVENTION:   -Glucerna Shake po TID, each supplement provides 220 kcal and 10 grams of protein   -Multivitamin with minerals daily  NUTRITION DIAGNOSIS:   Increased nutrient needs related to acute illness as evidenced by estimated needs.  GOAL:   Patient will meet greater than or equal to 90% of their needs  MONITOR:   PO intake, Supplement acceptance, Labs, Weight trends, I & O's  REASON FOR ASSESSMENT:   Malnutrition Screening Tool    ASSESSMENT:   70 year old Spanish-speaking female with insulin-dependent diabetes, HTN, HLD status post living donor renal transplant 1997 on chronic immunosuppression, hypothyroidism, orthostatic hypotension on Florinef, advanced Lewy body dementia admitted to the hospital for altered mental status from [facility].  On the day of admission patient was noted to be febrile, confused, weak and having chills.  Upon admission she was in sepsis with tachycardia, febrile, tachypnea.  CT abdomen pelvis showed concerns for proctitis and possible pneumonia.  Patient in room, sleeping. Difficult to arouse, alert/oriented x 2. Not able to gather information at this time. Per chart review, pt from memory care unit. H/o renal transplant.  Consumed 45-99% of meals yesterday. Will order Glucerna shakes for additional kcals and protein.  Per weight records, pt has lost 6 lbs since 8/22 (3% wt loss x 3.5 months, insignificant for time frame).  Medications: KLOR-CON  Labs reviewed:   CBGs: 86-325 Low K  NUTRITION - FOCUSED PHYSICAL EXAM:  Flowsheet Row Most Recent Value  Orbital Region No depletion  Upper Arm Region Mild depletion  Thoracic and Lumbar Region No depletion  Buccal Region No depletion  Temple Region Moderate depletion  Clavicle Bone Region No depletion  Clavicle and Acromion Bone Region No depletion  Scapular Bone Region No depletion  Dorsal Hand No depletion  Patellar Region No  depletion  Anterior Thigh Region No depletion  Posterior Calf Region Mild depletion  Edema (RD Assessment) None  Hair Reviewed  Eyes Unable to assess  Mouth Unable to assess  Skin Reviewed       Diet Order:   Diet Order             DIET SOFT Room service appropriate? Yes; Fluid consistency: Thin  Diet effective now                   EDUCATION NEEDS:   Not appropriate for education at this time  Skin:  Skin Assessment: Reviewed RN Assessment  Last BM:  12/15 -type 6  Height:   Ht Readings from Last 1 Encounters:  12/24/20 5\' 2"  (1.575 m)    Weight:   Wt Readings from Last 1 Encounters:  12/24/20 66.3 kg    BMI:  Body mass index is 26.73 kg/m.  Estimated Nutritional Needs:   Kcal:  1650-1850  Protein:  75-85g  Fluid:  1.8L/day  12/26/20, MS, RD, LDN Inpatient Clinical Dietitian Contact information available via Amion

## 2020-12-26 NOTE — Progress Notes (Signed)
[facility]  Date of Admission:  12/23/2020     Total days of antibiotics 4         ASSESSMENT:  [name]'s blood cultures have been repeated and are pending. Evaluated by GI with recommendations for flexible sigmoidoscopy for further evaluation of proctitis and BRBPR. Sensitivities for Staph Hominis and Strep mitis remain pending.  Will continue with ceftriaxone and metronidazole pending blood culture results. Suspect she will need short course of treatment as this is likely transient bacteremia. Remaining medical and supportive care per primary team.   PLAN:  Continue current dose of ceftriaxone and metronidazole.  Monitor cultures for bacteremia / clearance of bacteremia. GI planning for flexible sigmoidoscopy when able.  Remaining medical and supportive care per primary team.   Principal Problem:   Severe sepsis (HCC) Active Problems:   History of renal transplant   Insulin dependent type 2 diabetes mellitus (HCC)   Dementia (HCC)   Hypothyroidism   Proctitis    feeding supplement (GLUCERNA SHAKE)  237 mL Oral TID BM   fludrocortisone  0.1 mg Oral QHS   insulin aspart  0-15 Units Subcutaneous TID AC & HS   [START ON 12/27/2020] insulin glargine-yfgn  18 Units Subcutaneous q AM   levothyroxine  100 mcg Oral Q0600   memantine  10 mg Oral Q12H   metroNIDAZOLE  500 mg Oral Q12H   multivitamin with minerals  1 tablet Oral Daily   predniSONE  5 mg Oral Q breakfast   QUEtiapine  25 mg Oral QHS   rosuvastatin  5 mg Oral QHS   sodium chloride flush  3 mL Intravenous Q12H   tacrolimus  0.5 mg Oral Daily    SUBJECTIVE:  Afebrile overnight with no acute events. Thinks she is feeling a little better today. No new concerns/complaints.   No Known Allergies   Review of Systems: Review of Systems  Constitutional:  Negative for chills, fever and weight loss.  Respiratory:  Negative for cough, shortness of breath and wheezing.   Cardiovascular:   Negative for chest pain and leg swelling.  Gastrointestinal:  Negative for abdominal pain, constipation, diarrhea, nausea and vomiting.  Skin:  Negative for rash.     OBJECTIVE: Vitals:   12/25/20 0930 12/25/20 1300 12/25/20 2003 12/26/20 0400  BP: (!) 149/66 (!) 150/76 (!) 165/84 (!) 147/68  Pulse: 82 84 86 93  Resp: 20 20 20    Temp: 98 F (36.7 C) 98.2 F (36.8 C) 98.4 F (36.9 C) 98.7 F (37.1 C)  TempSrc: Oral Oral Axillary Axillary  SpO2: 100% 100% 97% 92%  Weight:      Height:       Body mass index is 26.73 kg/m.  Physical Exam Constitutional:      General: She is not in acute distress.    Appearance: She is well-developed.  Cardiovascular:     Rate and Rhythm: Normal rate and regular rhythm.     Heart sounds: Normal heart sounds.  Pulmonary:     Effort: Pulmonary effort is normal.     Breath sounds: Normal breath sounds.  Skin:    General: Skin is warm and dry.  Neurological:     Mental Status: She is alert. She is disoriented.    Lab Results Lab Results  Component Value Date   WBC 10.0 12/26/2020   HGB 11.3 (L) 12/26/2020   HCT 35.6 (L) 12/26/2020   MCV 81.7 12/26/2020   PLT 131 (L) 12/26/2020  Lab Results  Component Value Date   CREATININE 0.62 12/26/2020   BUN 9 12/26/2020   NA 141 12/26/2020   K 3.0 (L) 12/26/2020   CL 110 12/26/2020   CO2 24 12/26/2020    Lab Results  Component Value Date   ALT 15 12/23/2020   AST 20 12/23/2020   ALKPHOS 73 12/23/2020   BILITOT 2.9 (H) 12/23/2020     Microbiology: Recent Results (from the past 240 hour(s))  Urine Culture     Status: Abnormal   Collection Time: 12/23/20  2:30 AM   Specimen: Urine, Clean Catch  Result Value Ref Range Status   Specimen Description   Final    URINE, CLEAN CATCH Performed at [facility], [address], [place], [place] [address]    Special Requests   Final    NONE Performed at [facility], [address] [place],  [place] [address]    Culture >=100,000 COLONIES/mL AEROCOCCUS URINAE (A)  Final   Report Status 12/26/2020 FINAL  Final  Resp Panel by RT-PCR (Flu A&B, Covid) Nasopharyngeal Swab     Status: None   Collection Time: 12/23/20  6:59 PM   Specimen: Nasopharyngeal Swab; Nasopharyngeal(NP) swabs in vial transport medium  Result Value Ref Range Status   SARS Coronavirus 2 by RT PCR NEGATIVE NEGATIVE Final    Comment: (NOTE) SARS-CoV-2 target nucleic acids are NOT DETECTED.  The SARS-CoV-2 RNA is generally detectable in upper respiratory specimens during the acute phase of infection. The lowest concentration of SARS-CoV-2 viral copies this assay can detect is 138 copies/mL. A negative result does not preclude SARS-Cov-2 infection and should not be used as the sole basis for treatment or other patient management decisions. A negative result may occur with  improper specimen collection/handling, submission of specimen other than nasopharyngeal swab, presence of viral mutation(s) within the areas targeted by this assay, and inadequate number of viral copies(<138 copies/mL). A negative result must be combined with clinical observations, patient history, and epidemiological information. The expected result is Negative.  Fact Sheet for Patients:  BloggerCourse.com  Fact Sheet for Healthcare Providers:  SeriousBroker.it  This test is no t yet approved or cleared by the [place] FDA and  has been authorized for detection and/or diagnosis of SARS-CoV-2 by FDA under an Emergency Use Authorization (EUA). This EUA will remain  in effect (meaning this test can be used) for the duration of the COVID-19 declaration under Section 564(b)(1) of the Act, 21 U.S.C.section 360bbb-3(b)(1), unless the authorization is terminated  or revoked sooner.       Influenza A by PCR NEGATIVE NEGATIVE Final   Influenza B by PCR NEGATIVE NEGATIVE Final    Comment:  (NOTE) The Xpert Xpress SARS-CoV-2/FLU/RSV plus assay is intended as an aid in the diagnosis of influenza from Nasopharyngeal swab specimens and should not be used as a sole basis for treatment. Nasal washings and aspirates are unacceptable for Xpert Xpress SARS-CoV-2/FLU/RSV testing.  Fact Sheet for Patients: BloggerCourse.com  Fact Sheet for Healthcare Providers: SeriousBroker.it  This test is not yet approved or cleared by the [place] FDA and has been authorized for detection and/or diagnosis of SARS-CoV-2 by FDA under an Emergency Use Authorization (EUA). This EUA will remain in effect (meaning this test can be used) for the duration of the COVID-19 declaration under Section 564(b)(1) of the Act, 21 U.S.C. section 360bbb-3(b)(1), unless the authorization is terminated or revoked.  Performed at [facility], [address]  [place] [place], [place] [address]   Blood Culture (routine x 2)     Status: Abnormal (Preliminary result)   Collection Time: 12/23/20  7:06 PM   Specimen: BLOOD  Result Value Ref Range Status   Specimen Description   Final    BLOOD RIGHT ANTECUBITAL Performed at [facility], [address] [place], [place] [address]    Special Requests   Final    BOTTLES DRAWN AEROBIC AND ANAEROBIC Blood Culture results may not be optimal due to an inadequate volume of blood received in culture bottles Performed at [facility], [address], [place], [place] [address]    Culture  Setup Time   Final    GRAM POSITIVE COCCI AEROBIC BOTTLE ONLY CRITICAL VALUE NOTED.  VALUE IS CONSISTENT WITH PREVIOUSLY REPORTED AND CALLED VALUE.    Culture (A)  Final    STAPHYLOCOCCUS HOMINIS CULTURE REINCUBATED FOR BETTER GROWTH Performed at [facility], [address] [place], [place] [address]    Report Status PENDING  Incomplete  Blood Culture (routine x 2)      Status: Abnormal (Preliminary result)   Collection Time: 12/23/20  7:11 PM   Specimen: BLOOD  Result Value Ref Range Status   Specimen Description   Final    BLOOD RIGHT ANTECUBITAL Performed at [facility], [address] [place], [place] [address]    Special Requests   Final    BOTTLES DRAWN AEROBIC AND ANAEROBIC Blood Culture results may not be optimal due to an inadequate volume of blood received in culture bottles Performed at [facility], [address] [place], [place] [address]    Culture  Setup Time   Final    GRAM POSITIVE COCCI IN CHAINS IN CLUSTERS IN BOTH AEROBIC AND ANAEROBIC BOTTLES CRITICAL RESULT CALLED TO, READ BACK BY AND VERIFIED WITH: PHARMD ANH PHAM 12/24/20  BY JW    Culture (A)  Final    STREPTOCOCCUS MITIS/ORALIS STAPHYLOCOCCUS HOMINIS SUSCEPTIBILITIES TO FOLLOW Performed at [facility], [address], [place], [place] [address]    Report Status PENDING  Incomplete  Blood Culture ID Panel (Reflexed)     Status: Abnormal   Collection Time: 12/23/20  7:11 PM  Result Value Ref Range Status   Enterococcus faecalis NOT DETECTED NOT DETECTED Final   Enterococcus Faecium NOT DETECTED NOT DETECTED Final   Listeria monocytogenes NOT DETECTED NOT DETECTED Final   Staphylococcus species DETECTED (A) NOT DETECTED Final    Comment: CRITICAL RESULT CALLED TO, READ BACK BY AND VERIFIED WITH: PHARMD ANH PHAM 12/24/20  BY JW    Staphylococcus aureus (BCID) NOT DETECTED NOT DETECTED Final   Staphylococcus epidermidis NOT DETECTED NOT DETECTED Final   Staphylococcus lugdunensis NOT DETECTED NOT DETECTED Final   Streptococcus species DETECTED (A) NOT DETECTED Final    Comment: Not Enterococcus species, Streptococcus agalactiae, Streptococcus pyogenes, or Streptococcus pneumoniae. CRITICAL RESULT CALLED TO, READ BACK BY AND VERIFIED WITH: PHARMD ANH PHAM 12/24/20  BY JW    Streptococcus agalactiae NOT  DETECTED NOT DETECTED Final   Streptococcus pneumoniae NOT DETECTED NOT DETECTED Final   Streptococcus pyogenes NOT DETECTED NOT DETECTED Final   A.calcoaceticus-baumannii NOT DETECTED NOT DETECTED Final   Bacteroides fragilis NOT DETECTED NOT DETECTED Final   Enterobacterales NOT DETECTED NOT DETECTED Final   Enterobacter cloacae complex NOT DETECTED NOT DETECTED Final   Escherichia coli NOT DETECTED NOT DETECTED Final   Klebsiella aerogenes NOT DETECTED NOT DETECTED  Final   Klebsiella oxytoca NOT DETECTED NOT DETECTED Final   Klebsiella pneumoniae NOT DETECTED NOT DETECTED Final   Proteus species NOT DETECTED NOT DETECTED Final   Salmonella species NOT DETECTED NOT DETECTED Final   Serratia marcescens NOT DETECTED NOT DETECTED Final   Haemophilus influenzae NOT DETECTED NOT DETECTED Final   Neisseria meningitidis NOT DETECTED NOT DETECTED Final   Pseudomonas aeruginosa NOT DETECTED NOT DETECTED Final   Stenotrophomonas maltophilia NOT DETECTED NOT DETECTED Final   Candida albicans NOT DETECTED NOT DETECTED Final   Candida auris NOT DETECTED NOT DETECTED Final   Candida glabrata NOT DETECTED NOT DETECTED Final   Candida krusei NOT DETECTED NOT DETECTED Final   Candida parapsilosis NOT DETECTED NOT DETECTED Final   Candida tropicalis NOT DETECTED NOT DETECTED Final   Cryptococcus neoformans/gattii NOT DETECTED NOT DETECTED Final    Comment: Performed at [facility], [address], [place], [place] [address]  Aerobic Culture w Gram Stain (superficial specimen)     Status: None   Collection Time: 12/23/20  9:34 PM   Specimen: RECTAL SWAB  Result Value Ref Range Status   Specimen Description   Final    RECTAL SWAB Performed at [facility], [address], [place], [place] [address]    Special Requests   Final    NONE Performed at [facility], [address], [place], [place] [address]    Gram Stain   Final    RARE WBC PRESENT,BOTH  PMN AND MONONUCLEAR FEW GRAM POSITIVE COCCI IN PAIRS RARE GRAM POSITIVE RODS FEW GRAM NEGATIVE RODS    Culture   Final    FEW MULTIPLE ORGANISMS PRESENT, NONE PREDOMINANT NO GROUP A STREP (S.PYOGENES) ISOLATED NO STAPHYLOCOCCUS AUREUS ISOLATED Performed at [facility], [address] [place], [place] [address]    Report Status 12/26/2020 FINAL  Final     Marcos Eke, NP [facility]  12/26/2020  12:28 PM

## 2020-12-27 LAB — CULTURE, BLOOD (ROUTINE X 2)

## 2020-12-27 LAB — MAGNESIUM: Magnesium: 1.5 mg/dL — ABNORMAL LOW (ref 1.7–2.4)

## 2020-12-27 LAB — BASIC METABOLIC PANEL
Anion gap: 8 (ref 5–15)
BUN: 7 mg/dL — ABNORMAL LOW (ref 8–23)
CO2: 24 mmol/L (ref 22–32)
Calcium: 9 mg/dL (ref 8.9–10.3)
Chloride: 107 mmol/L (ref 98–111)
Creatinine, Ser: 0.62 mg/dL (ref 0.44–1.00)
GFR, Estimated: >60 mL/min (ref >60)
Glucose, Bld: 107 mg/dL — ABNORMAL HIGH (ref 70–99)
Potassium: 3.3 mmol/L — ABNORMAL LOW (ref 3.5–5.1)
Sodium: 139 mmol/L (ref 135–145)

## 2020-12-27 LAB — CBC
HCT: 35.9 % — ABNORMAL LOW (ref 36.0–46.0)
Hemoglobin: 11.7 g/dL — ABNORMAL LOW (ref 12.0–15.0)
MCH: 25.9 pg — ABNORMAL LOW (ref 26.0–34.0)
MCHC: 32.6 g/dL (ref 30.0–36.0)
MCV: 79.4 fL — ABNORMAL LOW (ref 80.0–100.0)
Platelets: 148 10*3/uL — ABNORMAL LOW (ref 150–400)
RBC: 4.52 MIL/uL (ref 3.87–5.11)
RDW: 15 % (ref 11.5–15.5)
WBC: 7.2 10*3/uL (ref 4.0–10.5)
nRBC: 0 % (ref 0.0–0.2)

## 2020-12-27 LAB — GLUCOSE, CAPILLARY
Glucose-Capillary: 149 mg/dL — ABNORMAL HIGH (ref 70–99)
Glucose-Capillary: 330 mg/dL — ABNORMAL HIGH (ref 70–99)
Glucose-Capillary: 174 mg/dL — ABNORMAL HIGH (ref 70–99)
Glucose-Capillary: 186 mg/dL — ABNORMAL HIGH (ref 70–99)

## 2020-12-27 MED ORDER — INSULIN ASPART 100 UNIT/ML IJ SOLN
4.0000 [IU] | Freq: Three times a day (TID) | INTRAMUSCULAR | Status: DC
  Administered 2020-12-27 – 2020-12-30 (×6): 4 [IU] via SUBCUTANEOUS

## 2020-12-27 MED ORDER — POTASSIUM CHLORIDE CRYS ER 20 MEQ PO TBCR
40.0000 meq | EXTENDED_RELEASE_TABLET | Freq: Once | ORAL | Status: AC
  Administered 2020-12-27: 40 meq via ORAL
  Filled 2020-12-27: qty 2

## 2020-12-27 MED ORDER — FLEET ENEMA 7-19 GM/118ML RE ENEM
1.0000 | ENEMA | Freq: Once | RECTAL | Status: AC
  Administered 2020-12-28: 1 via RECTAL
  Filled 2020-12-27: qty 1

## 2020-12-27 MED ORDER — POLYETHYLENE GLYCOL 3350 17 G PO PACK
17.0000 g | PACK | Freq: Four times a day (QID) | ORAL | Status: DC
  Administered 2020-12-27 – 2020-12-29 (×8): 17 g via ORAL
  Filled 2020-12-27 (×8): qty 1

## 2020-12-27 MED ORDER — MAGNESIUM SULFATE 2 GM/50ML IV SOLN
2.0000 g | Freq: Once | INTRAVENOUS | Status: AC
  Administered 2020-12-27: 2 g via INTRAVENOUS
  Filled 2020-12-27: qty 50

## 2020-12-27 MED ORDER — FLUDROCORTISONE ACETATE 0.1 MG PO TABS
0.0500 mg | ORAL_TABLET | Freq: Every day | ORAL | Status: DC
  Administered 2020-12-27 – 2020-12-29 (×3): 0.05 mg via ORAL
  Filled 2020-12-27 (×3): qty 0.5

## 2020-12-27 NOTE — Progress Notes (Signed)
[facility]  Date of Admission:  12/23/2020     Total days of antibiotics 5         ASSESSMENT:  Emma Maldonado's repeat blood cultures have remained without growth to date. Previous Staphylococcus hominis found to be oxacillin resistant indicating ceftriaxone would not have been effective. At this point supports likely contamination given no growth on cultures to this point. Will hold antibiotics and monitor. GI planning for flexible sigmoidoscopy tomorrow for further evaluation of proctitis. If she did have pneumonia it should be adequately treated at this point with 5 days of therapy. Remaining medical and supportive care per primary team.   PLAN:  Hold antibiotics and monitor.  Monitor cultures for bacteremia. Flexible sigmoidoscopy per GI tomorrow.  Remaining medical and supportive care per primary team.   Dr. Luciana Axe to see this afternoon and Dr. Elinor Parkinson will be available over the weekend for ID related questions.   Principal Problem:   Severe sepsis (HCC) Active Problems:   History of renal transplant   Insulin dependent type 2 diabetes mellitus (HCC)   Dementia (HCC)   Hypothyroidism   Proctitis   Bacteremia    feeding supplement (GLUCERNA SHAKE)  237 mL Oral TID BM   fludrocortisone  0.05 mg Oral QHS   insulin aspart  0-15 Units Subcutaneous TID AC & HS   insulin glargine-yfgn  18 Units Subcutaneous q AM   levothyroxine  100 mcg Oral Q0600   memantine  10 mg Oral Q12H   multivitamin with minerals  1 tablet Oral Daily   polyethylene glycol  17 g Oral QID   predniSONE  5 mg Oral Q breakfast   QUEtiapine  25 mg Oral QHS   rosuvastatin  5 mg Oral QHS   sodium chloride flush  3 mL Intravenous Q12H   tacrolimus  0.5 mg Oral Daily    SUBJECTIVE:  Afebrile overnight with no acute events. No new concerns/complaints.   No Known Allergies   Review of Systems: Review of Systems  Constitutional:  Negative for chills, fever and weight loss.   Respiratory:  Negative for cough, shortness of breath and wheezing.   Cardiovascular:  Negative for chest pain and leg swelling.  Gastrointestinal:  Negative for abdominal pain, constipation, diarrhea, nausea and vomiting.  Skin:  Negative for rash.     OBJECTIVE: Vitals:   12/26/20 2219 12/27/20 0212 12/27/20 0710 12/27/20 0813  BP: (!) 197/82 (!) 187/79 (!) 190/70 (!) 142/66  Pulse: 77 68 73   Resp: 18 18 17    Temp: 98 F (36.7 C) (!) 97.1 F (36.2 C) 98.6 F (37 C)   TempSrc:   Oral   SpO2: 97% 96% 97%   Weight:      Height:       Body mass index is 26.73 kg/m.  Physical Exam Constitutional:      General: She is not in acute distress.    Appearance: She is well-developed.     Comments: Seated in the chair next to the bed.   Cardiovascular:     Rate and Rhythm: Normal rate and regular rhythm.     Heart sounds: Normal heart sounds.  Pulmonary:     Effort: Pulmonary effort is normal.     Breath sounds: Normal breath sounds.  Skin:    General: Skin is warm and dry.  Neurological:     Mental Status: She is alert. She is disoriented.    Lab Results Lab Results  Component  Value Date   WBC 7.2 12/27/2020   HGB 11.7 (L) 12/27/2020   HCT 35.9 (L) 12/27/2020   MCV 79.4 (L) 12/27/2020   PLT 148 (L) 12/27/2020    Lab Results  Component Value Date   CREATININE 0.62 12/27/2020   BUN 7 (L) 12/27/2020   NA 139 12/27/2020   K 3.3 (L) 12/27/2020   CL 107 12/27/2020   CO2 24 12/27/2020    Lab Results  Component Value Date   ALT 15 12/23/2020   AST 20 12/23/2020   ALKPHOS 73 12/23/2020   BILITOT 2.9 (H) 12/23/2020     Microbiology: Recent Results (from the past 240 hour(s))  Urine Culture     Status: Abnormal   Collection Time: 12/23/20  2:30 AM   Specimen: Urine, Clean Catch  Result Value Ref Range Status   Specimen Description   Final    URINE, CLEAN CATCH Performed at [facility], [address] [place], [place] [address]     Special Requests   Final    NONE Performed at [facility], [address], [place], [place] [address]    Culture >=100,000 COLONIES/mL AEROCOCCUS URINAE (A)  Final   Report Status 12/26/2020 FINAL  Final  Resp Panel by RT-PCR (Flu A&B, Covid) Nasopharyngeal Swab     Status: None   Collection Time: 12/23/20  6:59 PM   Specimen: Nasopharyngeal Swab; Nasopharyngeal(NP) swabs in vial transport medium  Result Value Ref Range Status   SARS Coronavirus 2 by RT PCR NEGATIVE NEGATIVE Final    Comment: (NOTE) SARS-CoV-2 target nucleic acids are NOT DETECTED.  The SARS-CoV-2 RNA is generally detectable in upper respiratory specimens during the acute phase of infection. The lowest concentration of SARS-CoV-2 viral copies this assay can detect is 138 copies/mL. A negative result does not preclude SARS-Cov-2 infection and should not be used as the sole basis for treatment or other patient management decisions. A negative result may occur with  improper specimen collection/handling, submission of specimen other than nasopharyngeal swab, presence of viral mutation(s) within the areas targeted by this assay, and inadequate number of viral copies(<138 copies/mL). A negative result must be combined with clinical observations, patient history, and epidemiological information. The expected result is Negative.  Fact Sheet for Patients:  BloggerCourse.com  Fact Sheet for Healthcare Providers:  SeriousBroker.it  This test is no t yet approved or cleared by the [place] FDA and  has been authorized for detection and/or diagnosis of SARS-CoV-2 by FDA under an Emergency Use Authorization (EUA). This EUA will remain  in effect (meaning this test can be used) for the duration of the COVID-19 declaration under Section 564(b)(1) of the Act, 21 U.S.C.section 360bbb-3(b)(1), unless the authorization is terminated  or revoked sooner.        Influenza A by PCR NEGATIVE NEGATIVE Final   Influenza B by PCR NEGATIVE NEGATIVE Final    Comment: (NOTE) The Xpert Xpress SARS-CoV-2/FLU/RSV plus assay is intended as an aid in the diagnosis of influenza from Nasopharyngeal swab specimens and should not be used as a sole basis for treatment. Nasal washings and aspirates are unacceptable for Xpert Xpress SARS-CoV-2/FLU/RSV testing.  Fact Sheet for Patients: BloggerCourse.com  Fact Sheet for Healthcare Providers: SeriousBroker.it  This test is not yet approved or cleared by the [place] FDA and has been authorized for detection and/or diagnosis of SARS-CoV-2 by FDA under an Emergency Use Authorization (EUA). This EUA will remain in effect (meaning this test can be  used) for the duration of the COVID-19 declaration under Section 564(b)(1) of the Act, 21 U.S.C. section 360bbb-3(b)(1), unless the authorization is terminated or revoked.  Performed at [facility], [address], [place], [place] [address]   Blood Culture (routine x 2)     Status: Abnormal   Collection Time: 12/23/20  7:06 PM   Specimen: BLOOD  Result Value Ref Range Status   Specimen Description   Final    BLOOD RIGHT ANTECUBITAL Performed at [facility], [address], [place], [place] [address]    Special Requests   Final    BOTTLES DRAWN AEROBIC AND ANAEROBIC Blood Culture results may not be optimal due to an inadequate volume of blood received in culture bottles Performed at [facility], [address], [place], [place] [address]    Culture  Setup Time   Final    GRAM POSITIVE COCCI AEROBIC BOTTLE ONLY CRITICAL VALUE NOTED.  VALUE IS CONSISTENT WITH PREVIOUSLY REPORTED AND CALLED VALUE.    Culture (A)  Final    STAPHYLOCOCCUS HOMINIS SUSCEPTIBILITIES PERFORMED ON PREVIOUS CULTURE WITHIN THE LAST 5 DAYS. Performed at [facility], [address], [place], [place] [address]    Report Status 12/27/2020 FINAL  Final  Blood Culture (routine x 2)     Status: Abnormal   Collection Time: 12/23/20  7:11 PM   Specimen: BLOOD  Result Value Ref Range Status   Specimen Description   Final    BLOOD RIGHT ANTECUBITAL Performed at [facility], [address] [place], [place] [address]    Special Requests   Final    BOTTLES DRAWN AEROBIC AND ANAEROBIC Blood Culture results may not be optimal due to an inadequate volume of blood received in culture bottles Performed at [facility], [address] [place], [place] [address]    Culture  Setup Time   Final    GRAM POSITIVE COCCI IN CHAINS IN CLUSTERS IN BOTH AEROBIC AND ANAEROBIC BOTTLES CRITICAL RESULT CALLED TO, READ BACK BY AND VERIFIED WITH: PHARMD ANH PHAM 12/24/20  BY JW Performed at [facility], [address], [place], [place] [address]    Culture (A)  Final    STREPTOCOCCUS MITIS/ORALIS STAPHYLOCOCCUS HOMINIS    Report Status 12/27/2020 FINAL  Final   Organism ID, Bacteria STREPTOCOCCUS MITIS/ORALIS  Final   Organism ID, Bacteria STAPHYLOCOCCUS HOMINIS  Final      Susceptibility   Staphylococcus hominis - MIC*    CIPROFLOXACIN >=8 RESISTANT Resistant     ERYTHROMYCIN >=8 RESISTANT Resistant     GENTAMICIN <=0.5 SENSITIVE Sensitive     OXACILLIN >=4 RESISTANT Resistant     TETRACYCLINE <=1 SENSITIVE Sensitive     VANCOMYCIN <=0.5 SENSITIVE Sensitive     TRIMETH/SULFA 80 RESISTANT Resistant     CLINDAMYCIN RESISTANT Resistant     RIFAMPIN <=0.5 SENSITIVE Sensitive     Inducible Clindamycin POSITIVE Resistant     * STAPHYLOCOCCUS HOMINIS   Streptococcus mitis/oralis - MIC*    TETRACYCLINE 0.5 SENSITIVE Sensitive     VANCOMYCIN 0.5 SENSITIVE Sensitive     CLINDAMYCIN <=0.25 SENSITIVE Sensitive     PENICILLIN Value in next row Sensitive      SENSITIVE0.06    CEFTRIAXONE Value in next row Sensitive       SENSITIVE0.12    * STREPTOCOCCUS MITIS/ORALIS  Blood Culture ID Panel (Reflexed)     Status: Abnormal   Collection Time:  12/23/20  7:11 PM  Result Value Ref Range Status   Enterococcus faecalis NOT DETECTED NOT DETECTED Final   Enterococcus Faecium NOT DETECTED NOT DETECTED Final   Listeria monocytogenes NOT DETECTED NOT DETECTED Final   Staphylococcus species DETECTED (A) NOT DETECTED Final    Comment: CRITICAL RESULT CALLED TO, READ BACK BY AND VERIFIED WITH: PHARMD ANH PHAM 12/24/20  BY JW    Staphylococcus aureus (BCID) NOT DETECTED NOT DETECTED Final   Staphylococcus epidermidis NOT DETECTED NOT DETECTED Final   Staphylococcus lugdunensis NOT DETECTED NOT DETECTED Final   Streptococcus species DETECTED (A) NOT DETECTED Final    Comment: Not Enterococcus species, Streptococcus agalactiae, Streptococcus pyogenes, or Streptococcus pneumoniae. CRITICAL RESULT CALLED TO, READ BACK BY AND VERIFIED WITH: PHARMD ANH PHAM 12/24/20  BY JW    Streptococcus agalactiae NOT DETECTED NOT DETECTED Final   Streptococcus pneumoniae NOT DETECTED NOT DETECTED Final   Streptococcus pyogenes NOT DETECTED NOT DETECTED Final   A.calcoaceticus-baumannii NOT DETECTED NOT DETECTED Final   Bacteroides fragilis NOT DETECTED NOT DETECTED Final   Enterobacterales NOT DETECTED NOT DETECTED Final   Enterobacter cloacae complex NOT DETECTED NOT DETECTED Final   Escherichia coli NOT DETECTED NOT DETECTED Final   Klebsiella aerogenes NOT DETECTED NOT DETECTED Final   Klebsiella oxytoca NOT DETECTED NOT DETECTED Final   Klebsiella pneumoniae NOT DETECTED NOT DETECTED Final   Proteus species NOT DETECTED NOT DETECTED Final   Salmonella species NOT DETECTED NOT DETECTED Final   Serratia marcescens NOT DETECTED NOT DETECTED Final   Haemophilus influenzae NOT DETECTED NOT DETECTED Final   Neisseria meningitidis NOT DETECTED NOT DETECTED Final   Pseudomonas aeruginosa NOT DETECTED NOT DETECTED Final    Stenotrophomonas maltophilia NOT DETECTED NOT DETECTED Final   Candida albicans NOT DETECTED NOT DETECTED Final   Candida auris NOT DETECTED NOT DETECTED Final   Candida glabrata NOT DETECTED NOT DETECTED Final   Candida krusei NOT DETECTED NOT DETECTED Final   Candida parapsilosis NOT DETECTED NOT DETECTED Final   Candida tropicalis NOT DETECTED NOT DETECTED Final   Cryptococcus neoformans/gattii NOT DETECTED NOT DETECTED Final    Comment: Performed at [facility], [address] [place], [place] [address]  Aerobic Culture w Gram Stain (superficial specimen)     Status: None   Collection Time: 12/23/20  9:34 PM   Specimen: RECTAL SWAB  Result Value Ref Range Status   Specimen Description   Final    RECTAL SWAB Performed at [facility], [address], [place], [place] [address]    Special Requests   Final    NONE Performed at [facility], [address] [place], [place] [address]    Gram Stain   Final    RARE WBC PRESENT,BOTH PMN AND MONONUCLEAR FEW GRAM POSITIVE COCCI IN PAIRS RARE GRAM POSITIVE RODS FEW GRAM NEGATIVE RODS    Culture   Final    FEW MULTIPLE ORGANISMS PRESENT, NONE PREDOMINANT NO GROUP A STREP (S.PYOGENES) ISOLATED NO STAPHYLOCOCCUS AUREUS ISOLATED Performed at [facility], [address] [place], [place] [address]    Report Status 12/26/2020 FINAL  Final  Culture, blood (routine x 2)     Status: None (Preliminary result)   Collection Time: 12/26/20  9:34 AM   Specimen: BLOOD  Result Value Ref Range Status   Specimen Description   Final    BLOOD RIGHT ANTECUBITAL Performed at [facility], [address] [place], [place] [address]  Special Requests   Final    BOTTLES DRAWN AEROBIC ONLY Blood Culture adequate volume Performed at [facility], [address], [place], [place] [address]    Culture   Final    NO GROWTH < 24 HOURS Performed at [facility], [address] [place], [place] [address]    Report Status PENDING  Incomplete  Culture, blood (routine x 2)     Status: None (Preliminary result)   Collection Time: 12/26/20  9:35 AM   Specimen: BLOOD LEFT HAND  Result Value Ref Range Status   Specimen Description   Final    BLOOD LEFT HAND Performed at [facility], [address] [place], [place] [address]    Special Requests   Final    BOTTLES DRAWN AEROBIC ONLY Blood Culture adequate volume Performed at [facility], [address] [place], [place] [address]    Culture   Final    NO GROWTH < 24 HOURS Performed at [facility], [address], [place], [place] [address]    Report Status PENDING  Incomplete     Emma Eke, NP [facility]  12/27/2020  11:53 AM

## 2020-12-27 NOTE — Progress Notes (Signed)
PROGRESS NOTE    Emma Maldonado  WUJ:[id number] DOB: 12-02-1950 DOA: 12/23/2020 PCP: Patient, No Pcp Per (Inactive)     Brief Narrative:  Emma Maldonado is a 70 year old Spanish-speaking female with insulin-dependent diabetes, HTN, HLD, status post living donor renal transplant 1997 on chronic immunosuppression, hypothyroidism, orthostatic hypotension on Florinef, advanced Lewy body dementia resides at memory care unit, who was admitted to the hospital for altered mental status.  On the day of admission, patient was noted to be febrile to 103F, confused, weak and having chills.  Upon admission she was in sepsis with tachycardia, febrile, tachypnea.  CT abdomen pelvis showed concerns for proctitis and lower lobe pneumonia.  Patient was started on IV Rocephin, azithromycin and Flagyl along with fluids. Blood cultures with staph hominis in both bottles and strep mitis. ID consulted.   New events last 24 hours / Subjective: Patient sitting in a recliner, ate breakfast this morning.  Has no complaints this morning.  Assessment & Plan:   Principal Problem:   Severe sepsis (HCC) Active Problems:   History of renal transplant   Insulin dependent type 2 diabetes mellitus (HCC)   Dementia (HCC)   Hypothyroidism   Proctitis   Bacteremia   Severe sepsis, POA, secondary to staph hominis and strep mitis bacteremia, possibly from proctitis -Infectious disease following, remains on Rocephin and Flagyl -GI consulted, possible flexible sigmoidoscopy -Repeat blood cultures obtained 12/15 Pending  Acute metabolic encephalopathy on chronic Lewy body dementia -Secondary to above, seems to be at her baseline now -Continue Namenda, Seroquel  History of living donor renal transplant 1997 -Continue prednisone, Prograf  Diabetes mellitus type 2, hyperglycemia -Continue Semglee, sliding scale insulin.  Reduced Semglee dose due to hypoglycemia   Chronic orthostatic hypotension -Continue  Florinef, reduced dose due to resting hypertension  Hyperlipidemia -Continue Crestor  Hypothyroidism -Continue Synthroid  Hypokalemia -Replace, trend  Hypomagnesemia -Replace, trend  DVT prophylaxis:  SCDs Start: 12/24/20 0136  Code Status: Full code Family Communication: No family at bedside Disposition Plan:  Status is: Inpatient  Remains inpatient appropriate because: IV antibiotics.  GI work-up pending      Consultants:  ID GI   Antimicrobials:  Anti-infectives (From admission, onward)    Start     Dose/Rate Route Frequency Ordered Stop   12/25/20 2200  metroNIDAZOLE (FLAGYL) tablet 500 mg  Status:  Discontinued        500 mg Oral Every 12 hours 12/25/20 1420 12/27/20 1012   12/23/20 2300  metroNIDAZOLE (FLAGYL) IVPB 500 mg  Status:  Discontinued        500 mg 100 mL/hr over 60 Minutes Intravenous Every 12 hours 12/23/20 2245 12/25/20 1420   12/23/20 2045  cefTRIAXone (ROCEPHIN) 2 g in sodium chloride 0.9 % 100 mL IVPB  Status:  Discontinued        2 g 200 mL/hr over 30 Minutes Intravenous Every 24 hours 12/23/20 2040 12/27/20 1012   12/23/20 2045  azithromycin (ZITHROMAX) 500 mg in sodium chloride 0.9 % 250 mL IVPB  Status:  Discontinued        500 mg 250 mL/hr over 60 Minutes Intravenous Every 24 hours 12/23/20 2040 12/25/20 1420        Objective: Vitals:   12/26/20 2219 12/27/20 0212 12/27/20 0710 12/27/20 0813  BP: (!) 197/82 (!) 187/79 (!) 190/70 (!) 142/66  Pulse: 77 68 73   Resp: 18 18 17    Temp: 98 F (36.7 C) (!) 97.1 F (36.2 C) 98.6 F (37 C)  TempSrc:   Oral   SpO2: 97% 96% 97%   Weight:      Height:        Intake/Output Summary (Last 24 hours) at 12/27/2020 1154 Last data filed at 12/27/2020 0945 Gross per 24 hour  Intake 120 ml  Output --  Net 120 ml    Filed Weights   12/24/20 1434 12/24/20 2143  Weight: 70 kg 66.3 kg    Examination:  General exam: Appears calm and comfortable  Respiratory system: Clear to  auscultation anteriorly. Respiratory effort normal. No respiratory distress.  Cardiovascular system: S1 & S2 heard, RRR. No murmurs.  Gastrointestinal system: Abdomen is nondistended, soft and nontender.  Central nervous system: Alert  Extremities: Symmetric in appearance  Skin: No rashes, lesions or ulcers on exposed skin  Psychiatry: Dementia   Data Reviewed: I have personally reviewed following labs and imaging studies  CBC: Recent Labs  Lab 12/23/20 1906 12/24/20 0610 12/25/20 0517 12/26/20 0523 12/27/20 0519  WBC 10.3 13.6* 12.2* 10.0 7.2  NEUTROABS 9.3*  --   --   --   --   HGB 13.4 11.9* 11.6* 11.3* 11.7*  HCT 42.5 36.9 35.5* 35.6* 35.9*  MCV 82.0 80.7 80.3 81.7 79.4*  PLT 138* 129* 115* 131* 148*    Basic Metabolic Panel: Recent Labs  Lab 12/23/20 1906 12/24/20 0610 12/25/20 0517 12/26/20 0523 12/27/20 0519  NA 133* 139 136 141 139  K 3.8 2.9* 3.6 3.0* 3.3*  CL 103 109 106 110 107  CO2 20* GLUCOSE 440* 134* 137* 58* 107*  BUN 7*  CREATININE 1.07* 0.92 0.66 0.62 0.62  CALCIUM 8.7* 7.9* 8.4* 8.4* 9.0  MG  --   --  1.4*  --  1.5*    GFR: Estimated Creatinine Clearance: 57.6 mL/min (by C-G formula based on SCr of 0.62 mg/dL). Liver Function Tests: Recent Labs  Lab 12/23/20 1906  AST 20  ALT 15  ALKPHOS 73  BILITOT 2.9*  PROT 6.7  ALBUMIN 3.5    No results for input(s): LIPASE, AMYLASE in the last 168 hours. No results for input(s): AMMONIA in the last 168 hours. Coagulation Profile: Recent Labs  Lab 12/23/20 1906  INR 1.1    Cardiac Enzymes: No results for input(s): CKTOTAL, CKMB, CKMBINDEX, TROPONINI in the last 168 hours. BNP (last 3 results) No results for input(s): PROBNP in the last 8760 hours. HbA1C: No results for input(s): HGBA1C in the last 72 hours.  CBG: Recent Labs  Lab 12/26/20 1106 12/26/20 1649 12/26/20 2222 12/27/20 0750 12/27/20 1122  GLUCAP 125* 257* 270* 149* 330*    Lipid Profile: No  results for input(s): CHOL, HDL, LDLCALC, TRIG, CHOLHDL, LDLDIRECT in the last 72 hours. Thyroid Function Tests: No results for input(s): TSH, T4TOTAL, FREET4, T3FREE, THYROIDAB in the last 72 hours. Anemia Panel: No results for input(s): VITAMINB12, FOLATE, FERRITIN, TIBC, IRON, RETICCTPCT in the last 72 hours. Sepsis Labs: Recent Labs  Lab 12/23/20 1906 12/23/20 2031 12/24/20 0610  PROCALCITON  --   --  13.38  LATICACIDVEN 2.3* 1.8  --      Recent Results (from the past 240 hour(s))  Urine Culture     Status: Abnormal   Collection Time: 12/23/20  2:30 AM   Specimen: Urine, Clean Catch  Result Value Ref Range Status   Specimen Description   Final    URINE, CLEAN CATCH Performed at [facility], [address] [place],  [place] [address]    Special Requests   Final    NONE Performed at [facility], [address], [place], [place] [address]    Culture >=100,000 COLONIES/mL AEROCOCCUS URINAE (A)  Final   Report Status 12/26/2020 FINAL  Final  Resp Panel by RT-PCR (Flu A&B, Covid) Nasopharyngeal Swab     Status: None   Collection Time: 12/23/20  6:59 PM   Specimen: Nasopharyngeal Swab; Nasopharyngeal(NP) swabs in vial transport medium  Result Value Ref Range Status   SARS Coronavirus 2 by RT PCR NEGATIVE NEGATIVE Final    Comment: (NOTE) SARS-CoV-2 target nucleic acids are NOT DETECTED.  The SARS-CoV-2 RNA is generally detectable in upper respiratory specimens during the acute phase of infection. The lowest concentration of SARS-CoV-2 viral copies this assay can detect is 138 copies/mL. A negative result does not preclude SARS-Cov-2 infection and should not be used as the sole basis for treatment or other patient management decisions. A negative result may occur with  improper specimen collection/handling, submission of specimen other than nasopharyngeal swab, presence of viral mutation(s) within the areas targeted by this assay, and  inadequate number of viral copies(<138 copies/mL). A negative result must be combined with clinical observations, patient history, and epidemiological information. The expected result is Negative.  Fact Sheet for Patients:  BloggerCourse.com  Fact Sheet for Healthcare Providers:  SeriousBroker.it  This test is no t yet approved or cleared by the [place] FDA and  has been authorized for detection and/or diagnosis of SARS-CoV-2 by FDA under an Emergency Use Authorization (EUA). This EUA will remain  in effect (meaning this test can be used) for the duration of the COVID-19 declaration under Section 564(b)(1) of the Act, 21 U.S.C.section 360bbb-3(b)(1), unless the authorization is terminated  or revoked sooner.       Influenza A by PCR NEGATIVE NEGATIVE Final   Influenza B by PCR NEGATIVE NEGATIVE Final    Comment: (NOTE) The Xpert Xpress SARS-CoV-2/FLU/RSV plus assay is intended as an aid in the diagnosis of influenza from Nasopharyngeal swab specimens and should not be used as a sole basis for treatment. Nasal washings and aspirates are unacceptable for Xpert Xpress SARS-CoV-2/FLU/RSV testing.  Fact Sheet for Patients: BloggerCourse.com  Fact Sheet for Healthcare Providers: SeriousBroker.it  This test is not yet approved or cleared by the [place] FDA and has been authorized for detection and/or diagnosis of SARS-CoV-2 by FDA under an Emergency Use Authorization (EUA). This EUA will remain in effect (meaning this test can be used) for the duration of the COVID-19 declaration under Section 564(b)(1) of the Act, 21 U.S.C. section 360bbb-3(b)(1), unless the authorization is terminated or revoked.  Performed at [facility], [address] [place], [place] [address]   Blood Culture (routine x 2)     Status: Abnormal   Collection Time: 12/23/20   7:06 PM   Specimen: BLOOD  Result Value Ref Range Status   Specimen Description   Final    BLOOD RIGHT ANTECUBITAL Performed at [facility], [address], [place], [place] [address]    Special Requests   Final    BOTTLES DRAWN AEROBIC AND ANAEROBIC Blood Culture results may not be optimal due to an inadequate volume of blood received in culture bottles Performed at [facility], [address] [place], [place] [address]    Culture  Setup Time   Final    GRAM POSITIVE COCCI AEROBIC BOTTLE ONLY CRITICAL VALUE NOTED.  VALUE  IS CONSISTENT WITH PREVIOUSLY REPORTED AND CALLED VALUE.    Culture (A)  Final    STAPHYLOCOCCUS HOMINIS SUSCEPTIBILITIES PERFORMED ON PREVIOUS CULTURE WITHIN THE LAST 5 DAYS. Performed at [facility], [address] [place], [place] [address]    Report Status 12/27/2020 FINAL  Final  Blood Culture (routine x 2)     Status: Abnormal   Collection Time: 12/23/20  7:11 PM   Specimen: BLOOD  Result Value Ref Range Status   Specimen Description   Final    BLOOD RIGHT ANTECUBITAL Performed at [facility], [address] [place], [place] [address]    Special Requests   Final    BOTTLES DRAWN AEROBIC AND ANAEROBIC Blood Culture results may not be optimal due to an inadequate volume of blood received in culture bottles Performed at [facility], [address] [place], [place] [address]    Culture  Setup Time   Final    GRAM POSITIVE COCCI IN CHAINS IN CLUSTERS IN BOTH AEROBIC AND ANAEROBIC BOTTLES CRITICAL RESULT CALLED TO, READ BACK BY AND VERIFIED WITH: PHARMD ANH PHAM 12/24/20  BY JW Performed at [facility], [address], [place], [place] [address]    Culture (A)  Final    STREPTOCOCCUS MITIS/ORALIS STAPHYLOCOCCUS HOMINIS    Report Status 12/27/2020 FINAL  Final   Organism ID, Bacteria STREPTOCOCCUS MITIS/ORALIS  Final   Organism ID, Bacteria  STAPHYLOCOCCUS HOMINIS  Final      Susceptibility   Staphylococcus hominis - MIC*    CIPROFLOXACIN >=8 RESISTANT Resistant     ERYTHROMYCIN >=8 RESISTANT Resistant     GENTAMICIN <=0.5 SENSITIVE Sensitive     OXACILLIN >=4 RESISTANT Resistant     TETRACYCLINE <=1 SENSITIVE Sensitive     VANCOMYCIN <=0.5 SENSITIVE Sensitive     TRIMETH/SULFA 80 RESISTANT Resistant     CLINDAMYCIN RESISTANT Resistant     RIFAMPIN <=0.5 SENSITIVE Sensitive     Inducible Clindamycin POSITIVE Resistant     * STAPHYLOCOCCUS HOMINIS   Streptococcus mitis/oralis - MIC*    TETRACYCLINE 0.5 SENSITIVE Sensitive     VANCOMYCIN 0.5 SENSITIVE Sensitive     CLINDAMYCIN <=0.25 SENSITIVE Sensitive     PENICILLIN Value in next row Sensitive      SENSITIVE0.06    CEFTRIAXONE Value in next row Sensitive      SENSITIVE0.12    * STREPTOCOCCUS MITIS/ORALIS  Blood Culture ID Panel (Reflexed)     Status: Abnormal   Collection Time: 12/23/20  7:11 PM  Result Value Ref Range Status   Enterococcus faecalis NOT DETECTED NOT DETECTED Final   Enterococcus Faecium NOT DETECTED NOT DETECTED Final   Listeria monocytogenes NOT DETECTED NOT DETECTED Final   Staphylococcus species DETECTED (A) NOT DETECTED Final    Comment: CRITICAL RESULT CALLED TO, READ BACK BY AND VERIFIED WITH: PHARMD ANH PHAM 12/24/20  BY JW    Staphylococcus aureus (BCID) NOT DETECTED NOT DETECTED Final   Staphylococcus epidermidis NOT DETECTED NOT DETECTED Final   Staphylococcus lugdunensis NOT DETECTED NOT DETECTED Final   Streptococcus species DETECTED (A) NOT DETECTED Final    Comment: Not Enterococcus species, Streptococcus agalactiae, Streptococcus pyogenes, or Streptococcus pneumoniae. CRITICAL RESULT CALLED TO, READ BACK BY AND VERIFIED WITH: PHARMD ANH PHAM 12/24/20  BY JW    Streptococcus agalactiae NOT DETECTED NOT DETECTED Final   Streptococcus pneumoniae NOT DETECTED NOT DETECTED Final   Streptococcus pyogenes NOT DETECTED NOT  DETECTED Final   A.calcoaceticus-baumannii  NOT DETECTED NOT DETECTED Final   Bacteroides fragilis NOT DETECTED NOT DETECTED Final   Enterobacterales NOT DETECTED NOT DETECTED Final   Enterobacter cloacae complex NOT DETECTED NOT DETECTED Final   Escherichia coli NOT DETECTED NOT DETECTED Final   Klebsiella aerogenes NOT DETECTED NOT DETECTED Final   Klebsiella oxytoca NOT DETECTED NOT DETECTED Final   Klebsiella pneumoniae NOT DETECTED NOT DETECTED Final   Proteus species NOT DETECTED NOT DETECTED Final   Salmonella species NOT DETECTED NOT DETECTED Final   Serratia marcescens NOT DETECTED NOT DETECTED Final   Haemophilus influenzae NOT DETECTED NOT DETECTED Final   Neisseria meningitidis NOT DETECTED NOT DETECTED Final   Pseudomonas aeruginosa NOT DETECTED NOT DETECTED Final   Stenotrophomonas maltophilia NOT DETECTED NOT DETECTED Final   Candida albicans NOT DETECTED NOT DETECTED Final   Candida auris NOT DETECTED NOT DETECTED Final   Candida glabrata NOT DETECTED NOT DETECTED Final   Candida krusei NOT DETECTED NOT DETECTED Final   Candida parapsilosis NOT DETECTED NOT DETECTED Final   Candida tropicalis NOT DETECTED NOT DETECTED Final   Cryptococcus neoformans/gattii NOT DETECTED NOT DETECTED Final    Comment: Performed at [facility], [address], [place], [place] [address]  Aerobic Culture w Gram Stain (superficial specimen)     Status: None   Collection Time: 12/23/20  9:34 PM   Specimen: RECTAL SWAB  Result Value Ref Range Status   Specimen Description   Final    RECTAL SWAB Performed at [facility], [address], [place], [place] [address]    Special Requests   Final    NONE Performed at [facility], [address] [place], [place] [address]    Gram Stain   Final    RARE WBC PRESENT,BOTH PMN AND MONONUCLEAR FEW GRAM POSITIVE COCCI IN PAIRS RARE GRAM POSITIVE RODS FEW GRAM NEGATIVE RODS    Culture   Final    FEW  MULTIPLE ORGANISMS PRESENT, NONE PREDOMINANT NO GROUP A STREP (S.PYOGENES) ISOLATED NO STAPHYLOCOCCUS AUREUS ISOLATED Performed at [facility], [address] [place], [place] [address]    Report Status 12/26/2020 FINAL  Final  Culture, blood (routine x 2)     Status: None (Preliminary result)   Collection Time: 12/26/20  9:34 AM   Specimen: BLOOD  Result Value Ref Range Status   Specimen Description   Final    BLOOD RIGHT ANTECUBITAL Performed at [facility], [address], [place], [place] [address]    Special Requests   Final    BOTTLES DRAWN AEROBIC ONLY Blood Culture adequate volume Performed at [facility], [address] [place], [place] [address]    Culture   Final    NO GROWTH < 24 HOURS Performed at [facility], [address], [place], [place] [address]    Report Status PENDING  Incomplete  Culture, blood (routine x 2)     Status: None (Preliminary result)   Collection Time: 12/26/20  9:35 AM   Specimen: BLOOD LEFT HAND  Result Value Ref Range Status   Specimen Description   Final    BLOOD LEFT HAND Performed at [facility], [address], [place], [place] [address]    Special Requests   Final    BOTTLES DRAWN AEROBIC ONLY Blood Culture adequate volume Performed at [facility], [address], [place], [place] [address]    Culture   Final  NO GROWTH < 24 HOURS Performed at [facility], [address], [place], [place] [address]    Report Status PENDING  Incomplete       Radiology Studies: No results found.    Scheduled Meds:  feeding supplement (GLUCERNA SHAKE)  237 mL Oral TID BM   fludrocortisone  0.05 mg Oral QHS   insulin aspart  0-15 Units Subcutaneous TID AC & HS   insulin glargine-yfgn  18 Units Subcutaneous q AM   levothyroxine  100 mcg Oral Q0600   memantine  10 mg Oral Q12H   multivitamin with minerals  1 tablet Oral Daily    polyethylene glycol  17 g Oral QID   predniSONE  5 mg Oral Q breakfast   QUEtiapine  25 mg Oral QHS   rosuvastatin  5 mg Oral QHS   sodium chloride flush  3 mL Intravenous Q12H   tacrolimus  0.5 mg Oral Daily   Continuous Infusions:     LOS: 4 days      Time spent: 20 minutes   Noralee Stain, DO [facility] 12/27/2020, 11:54 AM   Available via Epic secure chat 7am-7pm After these hours, please refer to coverage provider listed on [email or web address]

## 2020-12-27 NOTE — Progress Notes (Signed)
[facility] Progress Note  Emma Maldonado 70 y.o. 1950-01-28  CC:  Proctitis   Subjective: Patient sitting comfortably in chair next to bed.  Spoke with daughter today and daughter gave consent for moving forward with flexible sigmoidoscopy tomorrow.  ROS : Review of Systems  Unable to perform ROS: Dementia     Objective: Vital signs in last 24 hours: Vitals:   12/27/20 0710 12/27/20 0813  BP: (!) 190/70 (!) 142/66  Pulse: 73   Resp: 17   Temp: 98.6 F (37 C)   SpO2: 97%     Physical Exam:  General:  Alert, cooperative, no distress, appears stated age  Head:  Normocephalic, without obvious abnormality, atraumatic  Eyes:  Anicteric sclera, EOM's intact  Lungs:   Clear to auscultation bilaterally, respirations unlabored  Heart:  Regular rate and rhythm, S1, S2 normal  Abdomen:   Soft, non-tender, bowel sounds active all four quadrants,  no masses,     Lab Results: Recent Labs    12/25/20 0517 12/26/20 0523 12/27/20 0519  NA 136 141 139  K 3.6 3.0* 3.3*  CL 106 110 107  CO2 23 24 24   GLUCOSE 137* 58* 107*  BUN 9 9 7*  CREATININE 0.66 0.62 0.62  CALCIUM 8.4* 8.4* 9.0  MG 1.4*  --  1.5*   No results for input(s): AST, ALT, ALKPHOS, BILITOT, PROT, ALBUMIN in the last 72 hours. Recent Labs    12/26/20 0523 12/27/20 0519  WBC 10.0 7.2  HGB 11.3* 11.7*  HCT 35.6* 35.9*  MCV 81.7 79.4*  PLT 131* 148*   No results for input(s): LABPROT, INR in the last 72 hours.    Assessment Proctitis, BRBPR -Hgb 11.7, (decreased from 13.4 12/12), MCV normal -Platelets 148 - normal renal function -Potassium 3.3 -CT abdomen pelvis with contrast 12/12: Circumferential thickening of rectum with presacral edema, stranding suggestive of proctitis.  No organized perirectal abscess.   Bacteremia -Receiving Flagyl and Rocephin -Improvement in leukocytosis, now 10.0     Plan: Plan for flexible sigmoidoscopy tomorrow. I thoroughly discussed the procedures  to include nature, alternatives, benefits, and risks including but not limited to bleeding, perforation, infection, anesthesia/cardiac and pulmonary complications. Patient provides understanding and gave verbal consent to proceed.   Clear liquid diet today NPO midnight 4 doses of miralax today Enema in the morning prior to flexible sigmoidoscopy   [facility] will follow.     Everette Dimauro 12/29/20 PA-C 12/27/2020, 11:51 AM  Contact #  [phone]

## 2020-12-27 NOTE — H&P (View-Only) (Signed)
[facility] Progress Note  Emma Maldonado 70 y.o. 1950-01-28  CC:  Proctitis   Subjective: Patient sitting comfortably in chair next to bed.  Spoke with daughter today and daughter gave consent for moving forward with flexible sigmoidoscopy tomorrow.  ROS : Review of Systems  Unable to perform ROS: Dementia     Objective: Vital signs in last 24 hours: Vitals:   12/27/20 0710 12/27/20 0813  BP: (!) 190/70 (!) 142/66  Pulse: 73   Resp: 17   Temp: 98.6 F (37 C)   SpO2: 97%     Physical Exam:  General:  Alert, cooperative, no distress, appears stated age  Head:  Normocephalic, without obvious abnormality, atraumatic  Eyes:  Anicteric sclera, EOM's intact  Lungs:   Clear to auscultation bilaterally, respirations unlabored  Heart:  Regular rate and rhythm, S1, S2 normal  Abdomen:   Soft, non-tender, bowel sounds active all four quadrants,  no masses,     Lab Results: Recent Labs    12/25/20 0517 12/26/20 0523 12/27/20 0519  NA 136 141 139  K 3.6 3.0* 3.3*  CL 106 110 107  CO2 23 24 24   GLUCOSE 137* 58* 107*  BUN 9 9 7*  CREATININE 0.66 0.62 0.62  CALCIUM 8.4* 8.4* 9.0  MG 1.4*  --  1.5*   No results for input(s): AST, ALT, ALKPHOS, BILITOT, PROT, ALBUMIN in the last 72 hours. Recent Labs    12/26/20 0523 12/27/20 0519  WBC 10.0 7.2  HGB 11.3* 11.7*  HCT 35.6* 35.9*  MCV 81.7 79.4*  PLT 131* 148*   No results for input(s): LABPROT, INR in the last 72 hours.    Assessment Proctitis, BRBPR -Hgb 11.7, (decreased from 13.4 12/12), MCV normal -Platelets 148 - normal renal function -Potassium 3.3 -CT abdomen pelvis with contrast 12/12: Circumferential thickening of rectum with presacral edema, stranding suggestive of proctitis.  No organized perirectal abscess.   Bacteremia -Receiving Flagyl and Rocephin -Improvement in leukocytosis, now 10.0     Plan: Plan for flexible sigmoidoscopy tomorrow. I thoroughly discussed the procedures  to include nature, alternatives, benefits, and risks including but not limited to bleeding, perforation, infection, anesthesia/cardiac and pulmonary complications. Patient provides understanding and gave verbal consent to proceed.   Clear liquid diet today NPO midnight 4 doses of miralax today Enema in the morning prior to flexible sigmoidoscopy   [facility] will follow.     Jason Frisbee 12/29/20 PA-C 12/27/2020, 11:51 AM  Contact #  [phone]

## 2020-12-27 NOTE — Progress Notes (Signed)
Inpatient Diabetes Program Recommendations  AACE/ADA: New Consensus Statement on Inpatient Glycemic Control (2015)  Target Ranges:  Prepandial:   less than 140 mg/dL      Peak postprandial:   less than 180 mg/dL (1-2 hours)      Critically ill patients:  140 - 180 mg/dL    Latest Reference Range & Units 12/27/20 07:50 12/27/20 11:22  Glucose-Capillary 70 - 99 mg/dL 237 (H) 628 (H)   Home DM Meds: Lantus 20 units daily     Novolog 10 units tid  Current Orders: Semglee 18 units daily Novolog 0-15 units TID ac/hs    Prednisone 5 mg daily/ Florinef 50 mcg daily    MD- Please consider starting Novolog Meal Coverage:  Novolog 4 units TID with meals  Hold if pt eats <50% of meal, Hold if pt NPO      --Will follow patient during hospitalization--  Ambrose Finland RN, MSN, CDE Diabetes Coordinator Inpatient Glycemic Control Team Team Pager: [phone] (8a-5p)

## 2020-12-28 ENCOUNTER — Encounter (HOSPITAL_COMMUNITY): Payer: Self-pay | Admitting: Internal Medicine

## 2020-12-28 ENCOUNTER — Encounter (HOSPITAL_COMMUNITY)
Admission: EM | Disposition: A | Discharge status: Skilled Nursing Facility | Payer: Self-pay | Source: Skilled Nursing Facility | Attending: Internal Medicine

## 2020-12-28 ENCOUNTER — Inpatient Hospital Stay (HOSPITAL_COMMUNITY): Payer: Medicare Other | Admitting: Anesthesiology

## 2020-12-28 HISTORY — PX: FLEXIBLE SIGMOIDOSCOPY: SHX5431

## 2020-12-28 HISTORY — PX: BIOPSY: SHX5522

## 2020-12-28 LAB — GLUCOSE, CAPILLARY
Glucose-Capillary: 98 mg/dL (ref 70–99)
Glucose-Capillary: 107 mg/dL — ABNORMAL HIGH (ref 70–99)
Glucose-Capillary: 94 mg/dL (ref 70–99)
Glucose-Capillary: 100 mg/dL — ABNORMAL HIGH (ref 70–99)
Glucose-Capillary: 76 mg/dL (ref 70–99)
Glucose-Capillary: 149 mg/dL — ABNORMAL HIGH (ref 70–99)

## 2020-12-28 LAB — CBC
HCT: 38.8 % (ref 36.0–46.0)
Hemoglobin: 12.3 g/dL (ref 12.0–15.0)
MCH: 25.4 pg — ABNORMAL LOW (ref 26.0–34.0)
MCHC: 31.7 g/dL (ref 30.0–36.0)
MCV: 80 fL (ref 80.0–100.0)
Platelets: 221 10*3/uL (ref 150–400)
RBC: 4.85 MIL/uL (ref 3.87–5.11)
RDW: 15.3 % (ref 11.5–15.5)
WBC: 8.7 10*3/uL (ref 4.0–10.5)
nRBC: 0 % (ref 0.0–0.2)

## 2020-12-28 LAB — BASIC METABOLIC PANEL
Anion gap: 9 (ref 5–15)
BUN: 6 mg/dL — ABNORMAL LOW (ref 8–23)
CO2: 26 mmol/L (ref 22–32)
Calcium: 9.4 mg/dL (ref 8.9–10.3)
Chloride: 107 mmol/L (ref 98–111)
Creatinine, Ser: 0.75 mg/dL (ref 0.44–1.00)
GFR, Estimated: >60 mL/min (ref >60)
Glucose, Bld: 85 mg/dL (ref 70–99)
Potassium: 3.9 mmol/L (ref 3.5–5.1)
Sodium: 142 mmol/L (ref 135–145)

## 2020-12-28 LAB — MAGNESIUM: Magnesium: 1.9 mg/dL (ref 1.7–2.4)

## 2020-12-28 SURGERY — SIGMOIDOSCOPY, FLEXIBLE
Anesthesia: Monitor Anesthesia Care

## 2020-12-28 MED ORDER — HYDROMORPHONE HCL 1 MG/ML IJ SOLN: 0.2500 mg | INTRAMUSCULAR | Status: DC | PRN

## 2020-12-28 MED ORDER — PROPOFOL 10 MG/ML IV BOLUS
INTRAVENOUS | Status: DC | PRN
  Administered 2020-12-28: 20 mg via INTRAVENOUS
  Administered 2020-12-28: 40 mg via INTRAVENOUS
  Administered 2020-12-28: 20 mg via INTRAVENOUS

## 2020-12-28 MED ORDER — LIDOCAINE 2% (20 MG/ML) 5 ML SYRINGE
INTRAMUSCULAR | Status: DC | PRN
  Administered 2020-12-28: 20 mg via INTRAVENOUS

## 2020-12-28 MED ORDER — HYDROCORTISONE 100 MG/60ML RE ENEM
100.0000 mg | ENEMA | Freq: Every day | RECTAL | Status: DC
  Administered 2020-12-28: 100 mg via RECTAL
  Filled 2020-12-28 (×2): qty 1

## 2020-12-28 MED ORDER — OXYCODONE HCL 5 MG PO TABS
5.0000 mg | ORAL_TABLET | Freq: Once | ORAL | Status: AC | PRN
  Administered 2020-12-30: 08:00:00 5 mg via ORAL
  Filled 2020-12-28: qty 1

## 2020-12-28 MED ORDER — PROMETHAZINE HCL 25 MG/ML IJ SOLN: 6.2500 mg | INTRAMUSCULAR | Status: DC | PRN

## 2020-12-28 MED ORDER — SODIUM CHLORIDE 0.9 % IV SOLN: INTRAVENOUS | Status: DC | PRN

## 2020-12-28 MED ORDER — OXYCODONE HCL 5 MG/5ML PO SOLN: 5.0000 mg | Freq: Once | ORAL | Status: AC | PRN

## 2020-12-28 NOTE — Progress Notes (Signed)
Signed      Pacu RN Report to floor given   Gave report to  BonnieRN. Room 1501: Discussed surgery, meds given in OR and Pacu, VS, IV fluids given, EBL, urine output, pain and other pertinent information. Also discussed if pt had any family or friends here or belongings with them.    Discussed that pt has her dentures in her mouth, no pain, VSS, done via rectum, no bleeding noted.    Pt exits my care.

## 2020-12-28 NOTE — TOC Progression Note (Addendum)
Transition of Care [facility]) - Progression Note    Patient Details  Name: Emma Maldonado MRN: [id number] Date of Birth: 1950/01/19  Transition of Care [facility]) CM/SW Contact  Darleene Cleaver, [place] Phone Number: 12/28/2020, 1:52 PM  Clinical Narrative:     CSW was informed that patient is from [facility] and may be ready for discharge today.  CSW contacted [facility], and they are not able to accept patient back until Monday due to care manager not being available over the weekend to confirm medications can be ordered.  CSW updated attending physician, CSW also requested that a new covid test be completed prior to patient discharging.  CSW to continue to follow patient's progress throughout discharge planning.   Expected Discharge Plan: Assisted Living Barriers to Discharge: No Barriers Identified  Expected Discharge Plan and Services Expected Discharge Plan: Assisted Living                                               Social Determinants of Health (SDOH) Interventions    Readmission Risk Interventions No flowsheet data found.

## 2020-12-28 NOTE — Progress Notes (Signed)
PROGRESS NOTE    Emma Maldonado  ZWC:[id number] DOB: 1950/08/10 DOA: 12/23/2020 PCP: Patient, No Pcp Per (Inactive)     Brief Narrative:  Emma Maldonado is a 70 year old Spanish-speaking female with insulin-dependent diabetes, HTN, HLD, status post living donor renal transplant 1997 on chronic immunosuppression, hypothyroidism, orthostatic hypotension on Florinef, advanced Lewy body dementia resides at memory care unit, who was admitted to the hospital for altered mental status.  On the day of admission, patient was noted to be febrile to 103F, confused, weak and having chills.  Upon admission she was in sepsis with tachycardia, febrile, tachypnea.  CT abdomen pelvis showed concerns for proctitis and lower lobe pneumonia.  Patient was started on IV Rocephin, azithromycin and Flagyl along with fluids. Blood cultures with staph hominis in both bottles and strep mitis. ID consulted.   New events last 24 hours / Subjective: States she is feeling sick, has a sore throat.   Assessment & Plan:   Principal Problem:   Severe sepsis (HCC) Active Problems:   History of renal transplant   Insulin dependent type 2 diabetes mellitus (HCC)   Dementia (HCC)   Hypothyroidism   Proctitis   Bacteremia   Severe sepsis, POA, secondary to proctitis -Infectious disease following, was on Rocephin and Flagyl, but previous Staphylococcus hominis found to be oxacillin resistant indicating ceftriaxone would not have been effective. At this point supports likely contamination given no growth on cultures to this point. Hold antibiotics and monitor. -Repeat blood cultures obtained 12/15 negative to date  -GI consulted, flexible sigmoidoscopy 12/17   Acute metabolic encephalopathy on chronic Lewy body dementia -Secondary to above, seems to be at her baseline now -Continue Namenda, Seroquel  History of living donor renal transplant 1997 -Continue prednisone, Prograf  Diabetes mellitus type 2,  hyperglycemia -Continue Semglee, sliding scale insulin  Chronic orthostatic hypotension -Continue Florinef, reduced dose due to resting hypertension  Hyperlipidemia -Continue Crestor  Hypothyroidism -Continue Synthroid   DVT prophylaxis:  SCDs Start: 12/24/20 0136  Code Status: Full code Family Communication: No family at bedside Disposition Plan:  Status is: Inpatient  Remains inpatient appropriate because: Flex sig planned today      Consultants:  ID GI   Antimicrobials:  Anti-infectives (From admission, onward)    Start     Dose/Rate Route Frequency Ordered Stop   12/25/20 2200  metroNIDAZOLE (FLAGYL) tablet 500 mg  Status:  Discontinued        500 mg Oral Every 12 hours 12/25/20 1420 12/27/20 1012   12/23/20 2300  metroNIDAZOLE (FLAGYL) IVPB 500 mg  Status:  Discontinued        500 mg 100 mL/hr over 60 Minutes Intravenous Every 12 hours 12/23/20 2245 12/25/20 1420   12/23/20 2045  cefTRIAXone (ROCEPHIN) 2 g in sodium chloride 0.9 % 100 mL IVPB  Status:  Discontinued        2 g 200 mL/hr over 30 Minutes Intravenous Every 24 hours 12/23/20 2040 12/27/20 1012   12/23/20 2045  azithromycin (ZITHROMAX) 500 mg in sodium chloride 0.9 % 250 mL IVPB  Status:  Discontinued        500 mg 250 mL/hr over 60 Minutes Intravenous Every 24 hours 12/23/20 2040 12/25/20 1420        Objective: Vitals:   12/27/20 0813 12/27/20 1315 12/27/20 2024 12/28/20 0419  BP: (!) 142/66 (!) 169/85 (!) 145/76 (!) 107/55  Pulse:  86 85 66  Resp:  16 16 18   Temp:  98.3 F (36.8 C)  97.8 F (36.6 C) 98.1 F (36.7 C)  TempSrc:  Oral Oral Axillary  SpO2:  100% 99% 92%  Weight:      Height:        Intake/Output Summary (Last 24 hours) at 12/28/2020 0933 Last data filed at 12/27/2020 1500 Gross per 24 hour  Intake 120 ml  Output 295 ml  Net -175 ml    Filed Weights   12/24/20 1434 12/24/20 2143  Weight: 70 kg 66.3 kg    Examination:  General exam: Appears calm and  comfortable  Respiratory system: Clear to auscultation anteriorly. Respiratory effort normal. No respiratory distress.  Cardiovascular system: S1 & S2 heard, RRR. No murmurs.  Gastrointestinal system: Abdomen is nondistended, soft and nontender.  Central nervous system: Alert  Extremities: Symmetric in appearance  Skin: No rashes, lesions or ulcers on exposed skin  Psychiatry: Dementia   Data Reviewed: I have personally reviewed following labs and imaging studies  CBC: Recent Labs  Lab 12/23/20 1906 12/24/20 0610 12/25/20 0517 12/26/20 0523 12/27/20 0519 12/28/20 0550  WBC 10.3 13.6* 12.2* 10.0 7.2 8.7  NEUTROABS 9.3*  --   --   --   --   --   HGB 13.4 11.9* 11.6* 11.3* 11.7* 12.3  HCT 42.5 36.9 35.5* 35.6* 35.9* 38.8  MCV 82.0 80.7 80.3 81.7 79.4* 80.0  PLT 138* 129* 115* 131* 148* 221    Basic Metabolic Panel: Recent Labs  Lab 12/24/20 0610 12/25/20 0517 12/26/20 0523 12/27/20 0519 12/28/20 0550  NA 139 136 141 139 142  K 2.9* 3.6 3.0* 3.3* 3.9  CL 109 106 110 107 107  CO2 22 23 24 24 26   GLUCOSE 134* 137* 58* 107* 85  BUN 16 9 9  7* 6*  CREATININE 0.92 0.66 0.62 0.62 0.75  CALCIUM 7.9* 8.4* 8.4* 9.0 9.4  MG  --  1.4*  --  1.5* 1.9    GFR: Estimated Creatinine Clearance: 57.6 mL/min (by C-G formula based on SCr of 0.75 mg/dL). Liver Function Tests: Recent Labs  Lab 12/23/20 1906  AST 20  ALT 15  ALKPHOS 73  BILITOT 2.9*  PROT 6.7  ALBUMIN 3.5    No results for input(s): LIPASE, AMYLASE in the last 168 hours. No results for input(s): AMMONIA in the last 168 hours. Coagulation Profile: Recent Labs  Lab 12/23/20 1906  INR 1.1    Cardiac Enzymes: No results for input(s): CKTOTAL, CKMB, CKMBINDEX, TROPONINI in the last 168 hours. BNP (last 3 results) No results for input(s): PROBNP in the last 8760 hours. HbA1C: No results for input(s): HGBA1C in the last 72 hours.  CBG: Recent Labs  Lab 12/27/20 0750 12/27/20 1122 12/27/20 1636  12/27/20 2131 12/28/20 0731  GLUCAP 149* 330* 174* 186* 98    Lipid Profile: No results for input(s): CHOL, HDL, LDLCALC, TRIG, CHOLHDL, LDLDIRECT in the last 72 hours. Thyroid Function Tests: No results for input(s): TSH, T4TOTAL, FREET4, T3FREE, THYROIDAB in the last 72 hours. Anemia Panel: No results for input(s): VITAMINB12, FOLATE, FERRITIN, TIBC, IRON, RETICCTPCT in the last 72 hours. Sepsis Labs: Recent Labs  Lab 12/23/20 1906 12/23/20 2031 12/24/20 0610  PROCALCITON  --   --  13.38  LATICACIDVEN 2.3* 1.8  --      Recent Results (from the past 240 hour(s))  Urine Culture     Status: Abnormal   Collection Time: 12/23/20  2:30 AM   Specimen: Urine, Clean Catch  Result Value Ref Range Status   Specimen Description  Final    URINE, CLEAN CATCH Performed at [facility], [address], [place], [place] [address]    Special Requests   Final    NONE Performed at [facility], [address], [place], [place] [address]    Culture >=100,000 COLONIES/mL AEROCOCCUS URINAE (A)  Final   Report Status 12/26/2020 FINAL  Final  Resp Panel by RT-PCR (Flu A&B, Covid) Nasopharyngeal Swab     Status: None   Collection Time: 12/23/20  6:59 PM   Specimen: Nasopharyngeal Swab; Nasopharyngeal(NP) swabs in vial transport medium  Result Value Ref Range Status   SARS Coronavirus 2 by RT PCR NEGATIVE NEGATIVE Final    Comment: (NOTE) SARS-CoV-2 target nucleic acids are NOT DETECTED.  The SARS-CoV-2 RNA is generally detectable in upper respiratory specimens during the acute phase of infection. The lowest concentration of SARS-CoV-2 viral copies this assay can detect is 138 copies/mL. A negative result does not preclude SARS-Cov-2 infection and should not be used as the sole basis for treatment or other patient management decisions. A negative result may occur with  improper specimen collection/handling, submission of specimen other than  nasopharyngeal swab, presence of viral mutation(s) within the areas targeted by this assay, and inadequate number of viral copies(<138 copies/mL). A negative result must be combined with clinical observations, patient history, and epidemiological information. The expected result is Negative.  Fact Sheet for Patients:  BloggerCourse.com  Fact Sheet for Healthcare Providers:  SeriousBroker.it  This test is no t yet approved or cleared by the [place] FDA and  has been authorized for detection and/or diagnosis of SARS-CoV-2 by FDA under an Emergency Use Authorization (EUA). This EUA will remain  in effect (meaning this test can be used) for the duration of the COVID-19 declaration under Section 564(b)(1) of the Act, 21 U.S.C.section 360bbb-3(b)(1), unless the authorization is terminated  or revoked sooner.       Influenza A by PCR NEGATIVE NEGATIVE Final   Influenza B by PCR NEGATIVE NEGATIVE Final    Comment: (NOTE) The Xpert Xpress SARS-CoV-2/FLU/RSV plus assay is intended as an aid in the diagnosis of influenza from Nasopharyngeal swab specimens and should not be used as a sole basis for treatment. Nasal washings and aspirates are unacceptable for Xpert Xpress SARS-CoV-2/FLU/RSV testing.  Fact Sheet for Patients: BloggerCourse.com  Fact Sheet for Healthcare Providers: SeriousBroker.it  This test is not yet approved or cleared by the [place] FDA and has been authorized for detection and/or diagnosis of SARS-CoV-2 by FDA under an Emergency Use Authorization (EUA). This EUA will remain in effect (meaning this test can be used) for the duration of the COVID-19 declaration under Section 564(b)(1) of the Act, 21 U.S.C. section 360bbb-3(b)(1), unless the authorization is terminated or revoked.  Performed at [facility], [address] [place], [place] [address]   Blood Culture (routine x 2)     Status: Abnormal   Collection Time: 12/23/20  7:06 PM   Specimen: BLOOD  Result Value Ref Range Status   Specimen Description   Final    BLOOD RIGHT ANTECUBITAL Performed at [facility], [address] [place], [place] [address]    Special Requests   Final    BOTTLES DRAWN AEROBIC AND ANAEROBIC Blood Culture results may not be optimal due to an inadequate volume of blood received in culture bottles Performed at [facility], [address], [place], [place] [address]    Culture  Setup  Time   Final    GRAM POSITIVE COCCI AEROBIC BOTTLE ONLY CRITICAL VALUE NOTED.  VALUE IS CONSISTENT WITH PREVIOUSLY REPORTED AND CALLED VALUE.    Culture (A)  Final    STAPHYLOCOCCUS HOMINIS SUSCEPTIBILITIES PERFORMED ON PREVIOUS CULTURE WITHIN THE LAST 5 DAYS. Performed at [facility], [address] [place], [place] [address]    Report Status 12/27/2020 FINAL  Final  Blood Culture (routine x 2)     Status: Abnormal   Collection Time: 12/23/20  7:11 PM   Specimen: BLOOD  Result Value Ref Range Status   Specimen Description   Final    BLOOD RIGHT ANTECUBITAL Performed at [facility], [address] [place], [place] [address]    Special Requests   Final    BOTTLES DRAWN AEROBIC AND ANAEROBIC Blood Culture results may not be optimal due to an inadequate volume of blood received in culture bottles Performed at [facility], [address] [place], [place] [address]    Culture  Setup Time   Final    GRAM POSITIVE COCCI IN CHAINS IN CLUSTERS IN BOTH AEROBIC AND ANAEROBIC BOTTLES CRITICAL RESULT CALLED TO, READ BACK BY AND VERIFIED WITH: PHARMD ANH PHAM 12/24/20  BY JW Performed at [facility], [address], [place], [place] [address]    Culture (A)  Final    STREPTOCOCCUS MITIS/ORALIS STAPHYLOCOCCUS HOMINIS    Report Status  12/27/2020 FINAL  Final   Organism ID, Bacteria STREPTOCOCCUS MITIS/ORALIS  Final   Organism ID, Bacteria STAPHYLOCOCCUS HOMINIS  Final      Susceptibility   Staphylococcus hominis - MIC*    CIPROFLOXACIN >=8 RESISTANT Resistant     ERYTHROMYCIN >=8 RESISTANT Resistant     GENTAMICIN <=0.5 SENSITIVE Sensitive     OXACILLIN >=4 RESISTANT Resistant     TETRACYCLINE <=1 SENSITIVE Sensitive     VANCOMYCIN <=0.5 SENSITIVE Sensitive     TRIMETH/SULFA 80 RESISTANT Resistant     CLINDAMYCIN RESISTANT Resistant     RIFAMPIN <=0.5 SENSITIVE Sensitive     Inducible Clindamycin POSITIVE Resistant     * STAPHYLOCOCCUS HOMINIS   Streptococcus mitis/oralis - MIC*    TETRACYCLINE 0.5 SENSITIVE Sensitive     VANCOMYCIN 0.5 SENSITIVE Sensitive     CLINDAMYCIN <=0.25 SENSITIVE Sensitive     PENICILLIN Value in next row Sensitive      SENSITIVE0.06    CEFTRIAXONE Value in next row Sensitive      SENSITIVE0.12    * STREPTOCOCCUS MITIS/ORALIS  Blood Culture ID Panel (Reflexed)     Status: Abnormal   Collection Time: 12/23/20  7:11 PM  Result Value Ref Range Status   Enterococcus faecalis NOT DETECTED NOT DETECTED Final   Enterococcus Faecium NOT DETECTED NOT DETECTED Final   Listeria monocytogenes NOT DETECTED NOT DETECTED Final   Staphylococcus species DETECTED (A) NOT DETECTED Final    Comment: CRITICAL RESULT CALLED TO, READ BACK BY AND VERIFIED WITH: PHARMD ANH PHAM 12/24/20  BY JW    Staphylococcus aureus (BCID) NOT DETECTED NOT DETECTED Final   Staphylococcus epidermidis NOT DETECTED NOT DETECTED Final   Staphylococcus lugdunensis NOT DETECTED NOT DETECTED Final   Streptococcus species DETECTED (A) NOT DETECTED Final    Comment: Not Enterococcus species, Streptococcus agalactiae, Streptococcus pyogenes, or Streptococcus pneumoniae. CRITICAL RESULT CALLED TO, READ BACK BY AND VERIFIED WITH: PHARMD ANH PHAM 12/24/20  BY JW    Streptococcus agalactiae NOT DETECTED NOT DETECTED Final    Streptococcus  pneumoniae NOT DETECTED NOT DETECTED Final   Streptococcus pyogenes NOT DETECTED NOT DETECTED Final   A.calcoaceticus-baumannii NOT DETECTED NOT DETECTED Final   Bacteroides fragilis NOT DETECTED NOT DETECTED Final   Enterobacterales NOT DETECTED NOT DETECTED Final   Enterobacter cloacae complex NOT DETECTED NOT DETECTED Final   Escherichia coli NOT DETECTED NOT DETECTED Final   Klebsiella aerogenes NOT DETECTED NOT DETECTED Final   Klebsiella oxytoca NOT DETECTED NOT DETECTED Final   Klebsiella pneumoniae NOT DETECTED NOT DETECTED Final   Proteus species NOT DETECTED NOT DETECTED Final   Salmonella species NOT DETECTED NOT DETECTED Final   Serratia marcescens NOT DETECTED NOT DETECTED Final   Haemophilus influenzae NOT DETECTED NOT DETECTED Final   Neisseria meningitidis NOT DETECTED NOT DETECTED Final   Pseudomonas aeruginosa NOT DETECTED NOT DETECTED Final   Stenotrophomonas maltophilia NOT DETECTED NOT DETECTED Final   Candida albicans NOT DETECTED NOT DETECTED Final   Candida auris NOT DETECTED NOT DETECTED Final   Candida glabrata NOT DETECTED NOT DETECTED Final   Candida krusei NOT DETECTED NOT DETECTED Final   Candida parapsilosis NOT DETECTED NOT DETECTED Final   Candida tropicalis NOT DETECTED NOT DETECTED Final   Cryptococcus neoformans/gattii NOT DETECTED NOT DETECTED Final    Comment: Performed at [facility], [address], [place], [place] [address]  Aerobic Culture w Gram Stain (superficial specimen)     Status: None   Collection Time: 12/23/20  9:34 PM   Specimen: RECTAL SWAB  Result Value Ref Range Status   Specimen Description   Final    RECTAL SWAB Performed at [facility], [address], [place], [place] [address]    Special Requests   Final    NONE Performed at [facility], [address], [place], [place] [address]    Gram Stain   Final    RARE WBC PRESENT,BOTH PMN AND MONONUCLEAR FEW GRAM  POSITIVE COCCI IN PAIRS RARE GRAM POSITIVE RODS FEW GRAM NEGATIVE RODS    Culture   Final    FEW MULTIPLE ORGANISMS PRESENT, NONE PREDOMINANT NO GROUP A STREP (S.PYOGENES) ISOLATED NO STAPHYLOCOCCUS AUREUS ISOLATED Performed at [facility], [address], [place], [place] [address]    Report Status 12/26/2020 FINAL  Final  Culture, blood (routine x 2)     Status: None (Preliminary result)   Collection Time: 12/26/20  9:34 AM   Specimen: BLOOD  Result Value Ref Range Status   Specimen Description   Final    BLOOD RIGHT ANTECUBITAL Performed at [facility], [address], [place], [place] [address]    Special Requests   Final    BOTTLES DRAWN AEROBIC ONLY Blood Culture adequate volume Performed at [facility], [address], [place], [place] [address]    Culture   Final    NO GROWTH < 24 HOURS Performed at [facility], [address], [place], [place] [address]    Report Status PENDING  Incomplete  Culture, blood (routine x 2)     Status: None (Preliminary result)   Collection Time: 12/26/20  9:35 AM   Specimen: BLOOD LEFT HAND  Result Value Ref Range Status   Specimen Description   Final    BLOOD LEFT HAND Performed at [facility], [address] [place], [place] [address]    Special Requests   Final    BOTTLES DRAWN AEROBIC ONLY Blood Culture adequate volume Performed at [facility], [address], [place], [place] [address]    Culture   Final    NO GROWTH < 24 HOURS Performed at [facility], [address], [place], [place] [address]    Report Status PENDING  Incomplete       Radiology Studies: No results found.    Scheduled Meds:  feeding supplement (GLUCERNA SHAKE)  237 mL Oral TID BM   fludrocortisone  0.05 mg Oral QHS   insulin aspart  0-15 Units Subcutaneous TID AC & HS   insulin aspart  4 Units Subcutaneous TID WC   insulin glargine-yfgn  18 Units  Subcutaneous q AM   levothyroxine  100 mcg Oral Q0600   memantine  10 mg Oral Q12H   multivitamin with minerals  1 tablet Oral Daily   polyethylene glycol  17 g Oral QID   predniSONE  5 mg Oral Q breakfast   QUEtiapine  25 mg Oral QHS   rosuvastatin  5 mg Oral QHS   sodium chloride flush  3 mL Intravenous Q12H   tacrolimus  0.5 mg Oral Daily   Continuous Infusions:     LOS: 5 days      Time spent: 20 minutes   Noralee Stain, DO [facility] 12/28/2020, 9:33 AM   Available via Epic secure chat 7am-7pm After these hours, please refer to coverage provider listed on [email or web address]

## 2020-12-28 NOTE — Anesthesia Postprocedure Evaluation (Signed)
Anesthesia Post Note  Patient: Emma Maldonado  Procedure(s) Performed: [facility]     Patient location during evaluation: PACU Anesthesia Type: MAC Level of consciousness: awake and alert Pain management: pain level controlled Vital Signs Assessment: post-procedure vital signs reviewed and stable Respiratory status: spontaneous breathing, nonlabored ventilation and respiratory function stable Cardiovascular status: blood pressure returned to baseline and stable Postop Assessment: no apparent nausea or vomiting Anesthetic complications: no   No notable events documented.  Last Vitals:  Vitals:   12/28/20 1130 12/28/20 1135  BP: (!) 171/84   Pulse: 90 80  Resp: 19 15  Temp: 36.7 C   SpO2: 94% 95%    Last Pain:  Vitals:   12/28/20 1130  TempSrc:   PainSc: 0-No pain                 Lynda Rainwater

## 2020-12-28 NOTE — Interval H&P Note (Signed)
History and Physical Interval Note:  12/28/2020 10:05 AM  Emma Maldonado  has presented today for surgery, with the diagnosis of Proctitis.  The various methods of treatment have been discussed with the patient and family. After consideration of risks, benefits and other options for treatment, the patient has consented to  Procedure(s): FLEXIBLE SIGMOIDOSCOPY (N/A) as a surgical intervention.  The patient's history has been reviewed, patient examined, no change in status, stable for surgery.  I have reviewed the patient's chart and labs.  Questions were answered to the patient's satisfaction.     Takai Chiaramonte

## 2020-12-28 NOTE — Anesthesia Preprocedure Evaluation (Signed)
Anesthesia Evaluation  Patient identified by MRN, date of birth, ID band Patient awake    Reviewed: Allergy & Precautions, NPO status , Patient's Chart, lab work & pertinent test results  Airway Mallampati: II  TM Distance: >3 FB Neck ROM: Full    Dental no notable dental hx.    Pulmonary neg pulmonary ROS,    Pulmonary exam normal breath sounds clear to auscultation       Cardiovascular hypertension, Pt. on medications negative cardio ROS Normal cardiovascular exam Rhythm:Regular Rate:Normal     Neuro/Psych Dementia negative neurological ROS  negative psych ROS   GI/Hepatic negative GI ROS, Neg liver ROS,   Endo/Other  diabetes, Insulin DependentHypothyroidism   Renal/GU negative Renal ROS  negative genitourinary   Musculoskeletal negative musculoskeletal ROS (+)   Abdominal   Peds negative pediatric ROS (+)  Hematology negative hematology ROS (+)   Anesthesia Other Findings   Reproductive/Obstetrics negative OB ROS                             Anesthesia Physical Anesthesia Plan  ASA: 3  Anesthesia Plan: MAC   Post-op Pain Management:    Induction: Intravenous  PONV Risk Score and Plan: 2 and Ondansetron, Midazolam and Treatment may vary due to age or medical condition  Airway Management Planned: Simple Face Mask  Additional Equipment:   Intra-op Plan:   Post-operative Plan:   Informed Consent: I have reviewed the patients History and Physical, chart, labs and discussed the procedure including the risks, benefits and alternatives for the proposed anesthesia with the patient or authorized representative who has indicated his/her understanding and acceptance.     Dental advisory given  Plan Discussed with: CRNA  Anesthesia Plan Comments:         Anesthesia Quick Evaluation

## 2020-12-28 NOTE — Op Note (Signed)
[facility] Patient Name: Emma Maldonado Procedure Date: 12/28/2020 MRN: [id number] Attending MD: Kathi Der , MD Date of Birth: May 07, 1950 CSN: [id number] Age: 70 Admit Type: Inpatient Procedure:                Flexible Sigmoidoscopy Indications:              Abnormal CT of the GI tract Providers:                Kathi Der, MD, Margaree Mackintosh, RN,                            Brion Aliment, Technician, Heron Nay, CRNA Referring MD:              Medicines:                Sedation Administered by an Anesthesia Professional Complications:            No immediate complications. Estimated Blood Loss:     Estimated blood loss was minimal. Estimated blood                            loss was minimal. Procedure:                Pre-Anesthesia Assessment:                           - Prior to the procedure, a History and Physical                            was performed, and patient medications and                            allergies were reviewed. The patient's tolerance of                            previous anesthesia was also reviewed. The risks                            and benefits of the procedure and the sedation                            options and risks were discussed with the patient.                            All questions were answered, and informed consent                            was obtained. Prior Anticoagulants: The patient has                            taken no previous anticoagulant or antiplatelet                            agents. ASA Grade Assessment: III - A patient with  severe systemic disease. After reviewing the risks                            and benefits, the patient was deemed in                            satisfactory condition to undergo the procedure.                           After obtaining informed consent, the scope was                            passed under direct vision. The [id number]-[id number]  [id number]                            Olympus endoscope was introduced through the anus                            and advanced to the 30 cm from the anal verge. The                            flexible sigmoidoscopy was accomplished without                            difficulty. The patient tolerated the procedure                            well. The quality of the bowel preparation was fair. Scope In: 10:31:48 AM Scope Out: 10:37:45 AM Total Procedure Duration: 0 hours 5 minutes 57 seconds  Findings:      The perianal and digital rectal examinations were normal.      Diffuse severe inflammation characterized by congestion (edema),       erythema, friability and mucus was found in the rectum. Biopsies were       taken with a cold forceps for histology.      Scattered mild inflammation characterized by erythema was found in the       recto-sigmoid colon.      Normal mucosa was found in the sigmoid colon. Impression:               - Preparation of the colon was fair.                           - Diffuse severe inflammation was found in the                            rectum secondary to proctitis. Biopsied.                           - Scattered mild inflammation was found in the                            recto-sigmoid colon.                           -  Normal mucosa in the sigmoid colon. Moderate Sedation:      Moderate (conscious) sedation was personally administered by an       anesthesia professional. The following parameters were monitored: oxygen       saturation, heart rate, blood pressure, and response to care. Recommendation:           - Return patient to hospital ward for ongoing care.                           - Resume previous diet.                           - Use Cortenema 1 per rectum daily. Procedure Code(s):        --- Professional ---                           [phone], Sigmoidoscopy, flexible; with biopsy, single                            or multiple Diagnosis Code(s):         --- Professional ---                           K62.89, Other specified diseases of anus and rectum                           K52.9, Noninfective gastroenteritis and colitis,                            unspecified                           R93.3, Abnormal findings on diagnostic imaging of                            other parts of digestive tract CPT copyright 2019 [employer]. All rights reserved. The codes documented in this report are preliminary and upon coder review may  be revised to meet current compliance requirements. Otis Brace, MD Otis Brace, MD 12/28/2020 10:44:09 AM Number of Addenda: 0

## 2020-12-28 NOTE — Anesthesia Procedure Notes (Signed)
Procedure Name: MAC Date/Time: 12/28/2020 10:25 AM Performed by: Cynda Familia, CRNA Pre-anesthesia Checklist: Patient identified, Emergency Drugs available, Suction available, Patient being monitored and Timeout performed Patient Re-evaluated:Patient Re-evaluated prior to induction Oxygen Delivery Method: Simple face mask Placement Confirmation: positive ETCO2 and breath sounds checked- equal and bilateral Dental Injury: Teeth and Oropharynx as per pre-operative assessment

## 2020-12-28 NOTE — Transfer of Care (Signed)
Immediate Anesthesia Transfer of Care Note  Patient: Emma Maldonado  Procedure(s) Performed: FLEXIBLE SIGMOIDOSCOPY BIOPSY  Patient Location: PACU  Anesthesia Type:MAC  Level of Consciousness: sedated  Airway & Oxygen Therapy: Patient Spontanous Breathing and Patient connected to face mask oxygen  Post-op Assessment: Report given to RN and Post -op Vital signs reviewed and stable  Post vital signs: Reviewed and stable  Last Vitals:  Vitals Value Taken Time  BP    Temp    Pulse 72 12/28/20 1053  Resp 19 12/28/20 1053  SpO2 100 % 12/28/20 1053  Vitals shown include unvalidated device data.  Last Pain:  Vitals:   12/28/20 1005  TempSrc: Oral  PainSc: 0-No pain      Patients Stated Pain Goal: 0 (22/58/34 6219)  Complications: No notable events documented.

## 2020-12-28 NOTE — Brief Op Note (Signed)
12/23/2020 - 12/28/2020  10:45 AM  PATIENT:  Emma Maldonado  70 y.o. female  PRE-OPERATIVE DIAGNOSIS:  Proctitis  POST-OPERATIVE DIAGNOSIS:  rectal biopsy; severe colitis   PROCEDURE:  Procedure(s): FLEXIBLE SIGMOIDOSCOPY (N/A) BIOPSY  SURGEON:  Surgeon(s) and Role:    * Aralynn Brake, MD - Primary  Findings --------- -Severe inflammation in the rectum without any mass lesion.  Mild inflammation in the rectosigmoid colon.  Recommendations ------------------------- -Start Cortenema -Okay to resume previous diet -Findings discussed with patient's daughter over the phone -GI will follow periodically  Kathi Der MD, FACP 12/28/2020, 10:46 AM  Contact #  [phone]

## 2020-12-29 LAB — GLUCOSE, CAPILLARY
Glucose-Capillary: 112 mg/dL — ABNORMAL HIGH (ref 70–99)
Glucose-Capillary: 209 mg/dL — ABNORMAL HIGH (ref 70–99)
Glucose-Capillary: 161 mg/dL — ABNORMAL HIGH (ref 70–99)
Glucose-Capillary: 393 mg/dL — ABNORMAL HIGH (ref 70–99)

## 2020-12-29 NOTE — Progress Notes (Signed)
PROGRESS NOTE    Emma Maldonado  ZOX:[id number] DOB: 1950/09/08 DOA: 12/23/2020 PCP: Patient, No Pcp Per (Inactive)     Brief Narrative:  Emma Maldonado is a 70 year old Spanish-speaking female with insulin-dependent diabetes, HTN, HLD, status post living donor renal transplant 1997 on chronic immunosuppression, hypothyroidism, orthostatic hypotension on Florinef, advanced Lewy body dementia resides at memory care unit, who was admitted to the hospital for altered mental status.  On the day of admission, patient was noted to be febrile to 103F, confused, weak and having chills.  Upon admission she was in sepsis with tachycardia, febrile, tachypnea.  CT abdomen pelvis showed concerns for proctitis and lower lobe pneumonia.  Patient was started on IV Rocephin, azithromycin and Flagyl along with fluids. Blood cultures with staph hominis in both bottles and strep mitis. ID consulted.  GI was also consulted for CT abdomen and pelvis findings, underwent flexible sigmoidoscopy 12/17.  New events last 24 hours / Subjective: No new complaints, finished her breakfast today, review of system is inadequate due to her dementia  Assessment & Plan:   Principal Problem:   Severe sepsis (HCC) Active Problems:   History of renal transplant   Insulin dependent type 2 diabetes mellitus (HCC)   Dementia (HCC)   Hypothyroidism   Proctitis   Bacteremia   Severe sepsis, POA, secondary to proctitis -Infectious disease following, was on Rocephin and Flagyl, but previous Staphylococcus hominis found to be oxacillin resistant indicating ceftriaxone would not have been effective. At this point supports likely contamination given no growth on cultures to this point. Hold antibiotics and monitor. -Repeat blood cultures obtained 12/15 negative to date  -GI consulted, flexible sigmoidoscopy 12/17 found severe inflammation in the rectum without any mass lesion, mild inflammation in the rectosigmoid colon.   Started Cortenema  Acute metabolic encephalopathy on chronic Lewy body dementia -Secondary to above, seems to be at her baseline now -Continue Namenda, Seroquel  History of living donor renal transplant 1997 -Continue prednisone, Prograf  Diabetes mellitus type 2, hyperglycemia -Continue Semglee, sliding scale insulin  Chronic orthostatic hypotension -Continue Florinef, reduced dose due to resting hypertension  Hyperlipidemia -Continue Crestor  Hypothyroidism -Continue Synthroid   DVT prophylaxis:  SCDs Start: 12/24/20 0136  Code Status: Full code Family Communication: No family at bedside Disposition Plan:  Status is: Inpatient  Remains inpatient appropriate because: Memory care unit cannot take patient over the weekend.  Plan for discharge 12/19.   Consultants:  ID GI   Antimicrobials:  Anti-infectives (From admission, onward)    Start     Dose/Rate Route Frequency Ordered Stop   12/25/20 2200  metroNIDAZOLE (FLAGYL) tablet 500 mg  Status:  Discontinued        500 mg Oral Every 12 hours 12/25/20 1420 12/27/20 1012   12/23/20 2300  metroNIDAZOLE (FLAGYL) IVPB 500 mg  Status:  Discontinued        500 mg 100 mL/hr over 60 Minutes Intravenous Every 12 hours 12/23/20 2245 12/25/20 1420   12/23/20 2045  cefTRIAXone (ROCEPHIN) 2 g in sodium chloride 0.9 % 100 mL IVPB  Status:  Discontinued        2 g 200 mL/hr over 30 Minutes Intravenous Every 24 hours 12/23/20 2040 12/27/20 1012   12/23/20 2045  azithromycin (ZITHROMAX) 500 mg in sodium chloride 0.9 % 250 mL IVPB  Status:  Discontinued        500 mg 250 mL/hr over 60 Minutes Intravenous Every 24 hours 12/23/20 2040 12/25/20 1420  Objective: Vitals:   12/28/20 1135 12/28/20 1447 12/28/20 1924 12/29/20 0413  BP:  (!) 129/58 130/62 (!) 159/76  Pulse: 80 71 76 73  Resp: 15 17 18 16   Temp:  97.7 F (36.5 C) 98.2 F (36.8 C) 98.8 F (37.1 C)  TempSrc:  Oral Oral Oral  SpO2: 95% 93% 97% 94%  Weight:       Height:        Intake/Output Summary (Last 24 hours) at 12/29/2020 1210 Last data filed at 12/29/2020 0815 Gross per 24 hour  Intake 3 ml  Output 2 ml  Net 1 ml    Filed Weights   12/24/20 1434 12/24/20 2143 12/28/20 1005  Weight: 70 kg 66.3 kg 66 kg    Examination:  General exam: Appears calm and comfortable  Respiratory system: Clear to auscultation anteriorly. Respiratory effort normal. No respiratory distress.  Cardiovascular system: S1 & S2 heard, RRR. No murmurs.  Gastrointestinal system: Abdomen is nondistended, soft and nontender.  Central nervous system: Alert  Extremities: Symmetric in appearance  Skin: No rashes, lesions or ulcers on exposed skin  Psychiatry: Dementia   Data Reviewed: I have personally reviewed following labs and imaging studies  CBC: Recent Labs  Lab 12/23/20 1906 12/24/20 0610 12/25/20 0517 12/26/20 0523 12/27/20 0519 12/28/20 0550  WBC 10.3 13.6* 12.2* 10.0 7.2 8.7  NEUTROABS 9.3*  --   --   --   --   --   HGB 13.4 11.9* 11.6* 11.3* 11.7* 12.3  HCT 42.5 36.9 35.5* 35.6* 35.9* 38.8  MCV 82.0 80.7 80.3 81.7 79.4* 80.0  PLT 138* 129* 115* 131* 148* 221    Basic Metabolic Panel: Recent Labs  Lab 12/24/20 0610 12/25/20 0517 12/26/20 0523 12/27/20 0519 12/28/20 0550  NA 139 136 141 139 142  K 2.9* 3.6 3.0* 3.3* 3.9  CL 109 106 110 107 107  CO2 22 23 24 24 26   GLUCOSE 134* 137* 58* 107* 85  BUN 16 9 9  7* 6*  CREATININE 0.92 0.66 0.62 0.62 0.75  CALCIUM 7.9* 8.4* 8.4* 9.0 9.4  MG  --  1.4*  --  1.5* 1.9    GFR: Estimated Creatinine Clearance: 54.5 mL/min (by C-G formula based on SCr of 0.75 mg/dL). Liver Function Tests: Recent Labs  Lab 12/23/20 1906  AST 20  ALT 15  ALKPHOS 73  BILITOT 2.9*  PROT 6.7  ALBUMIN 3.5    No results for input(s): LIPASE, AMYLASE in the last 168 hours. No results for input(s): AMMONIA in the last 168 hours. Coagulation Profile: Recent Labs  Lab 12/23/20 1906  INR 1.1    Cardiac  Enzymes: No results for input(s): CKTOTAL, CKMB, CKMBINDEX, TROPONINI in the last 168 hours. BNP (last 3 results) No results for input(s): PROBNP in the last 8760 hours. HbA1C: No results for input(s): HGBA1C in the last 72 hours.  CBG: Recent Labs  Lab 12/28/20 1200 12/28/20 1642 12/28/20 1928 12/29/20 0736 12/29/20 1146  GLUCAP 100* 76 149* 112* 209*    Lipid Profile: No results for input(s): CHOL, HDL, LDLCALC, TRIG, CHOLHDL, LDLDIRECT in the last 72 hours. Thyroid Function Tests: No results for input(s): TSH, T4TOTAL, FREET4, T3FREE, THYROIDAB in the last 72 hours. Anemia Panel: No results for input(s): VITAMINB12, FOLATE, FERRITIN, TIBC, IRON, RETICCTPCT in the last 72 hours. Sepsis Labs: Recent Labs  Lab 12/23/20 1906 12/23/20 2031 12/24/20 0610  PROCALCITON  --   --  13.38  LATICACIDVEN 2.3* 1.8  --  Recent Results (from the past 240 hour(s))  Urine Culture     Status: Abnormal   Collection Time: 12/23/20  2:30 AM   Specimen: Urine, Clean Catch  Result Value Ref Range Status   Specimen Description   Final    URINE, CLEAN CATCH Performed at [facility], [address] [place], [place] [address]    Special Requests   Final    NONE Performed at [facility], [address], [place], [place] [address]    Culture >=100,000 COLONIES/mL AEROCOCCUS URINAE (A)  Final   Report Status 12/26/2020 FINAL  Final  Resp Panel by RT-PCR (Flu A&B, Covid) Nasopharyngeal Swab     Status: None   Collection Time: 12/23/20  6:59 PM   Specimen: Nasopharyngeal Swab; Nasopharyngeal(NP) swabs in vial transport medium  Result Value Ref Range Status   SARS Coronavirus 2 by RT PCR NEGATIVE NEGATIVE Final    Comment: (NOTE) SARS-CoV-2 target nucleic acids are NOT DETECTED.  The SARS-CoV-2 RNA is generally detectable in upper respiratory specimens during the acute phase of infection. The lowest concentration of SARS-CoV-2 viral copies this  assay can detect is 138 copies/mL. A negative result does not preclude SARS-Cov-2 infection and should not be used as the sole basis for treatment or other patient management decisions. A negative result may occur with  improper specimen collection/handling, submission of specimen other than nasopharyngeal swab, presence of viral mutation(s) within the areas targeted by this assay, and inadequate number of viral copies(<138 copies/mL). A negative result must be combined with clinical observations, patient history, and epidemiological information. The expected result is Negative.  Fact Sheet for Patients:  BloggerCourse.com  Fact Sheet for Healthcare Providers:  SeriousBroker.it  This test is no t yet approved or cleared by the [place] FDA and  has been authorized for detection and/or diagnosis of SARS-CoV-2 by FDA under an Emergency Use Authorization (EUA). This EUA will remain  in effect (meaning this test can be used) for the duration of the COVID-19 declaration under Section 564(b)(1) of the Act, 21 U.S.C.section 360bbb-3(b)(1), unless the authorization is terminated  or revoked sooner.       Influenza A by PCR NEGATIVE NEGATIVE Final   Influenza B by PCR NEGATIVE NEGATIVE Final    Comment: (NOTE) The Xpert Xpress SARS-CoV-2/FLU/RSV plus assay is intended as an aid in the diagnosis of influenza from Nasopharyngeal swab specimens and should not be used as a sole basis for treatment. Nasal washings and aspirates are unacceptable for Xpert Xpress SARS-CoV-2/FLU/RSV testing.  Fact Sheet for Patients: BloggerCourse.com  Fact Sheet for Healthcare Providers: SeriousBroker.it  This test is not yet approved or cleared by the [place] FDA and has been authorized for detection and/or diagnosis of SARS-CoV-2 by FDA under an Emergency Use Authorization (EUA). This EUA will  remain in effect (meaning this test can be used) for the duration of the COVID-19 declaration under Section 564(b)(1) of the Act, 21 U.S.C. section 360bbb-3(b)(1), unless the authorization is terminated or revoked.  Performed at [facility], [address], [place], [place] [address]   Blood Culture (routine x 2)     Status: Abnormal   Collection Time: 12/23/20  7:06 PM   Specimen: BLOOD  Result Value Ref Range Status   Specimen Description   Final    BLOOD RIGHT ANTECUBITAL Performed at [facility], [address], [place], [place] [address]    Special Requests   Final  BOTTLES DRAWN AEROBIC AND ANAEROBIC Blood Culture results may not be optimal due to an inadequate volume of blood received in culture bottles Performed at [facility], [address] [place], [place] [address]    Culture  Setup Time   Final    GRAM POSITIVE COCCI AEROBIC BOTTLE ONLY CRITICAL VALUE NOTED.  VALUE IS CONSISTENT WITH PREVIOUSLY REPORTED AND CALLED VALUE.    Culture (A)  Final    STAPHYLOCOCCUS HOMINIS SUSCEPTIBILITIES PERFORMED ON PREVIOUS CULTURE WITHIN THE LAST 5 DAYS. Performed at [facility], [address], [place], [place] [address]    Report Status 12/27/2020 FINAL  Final  Blood Culture (routine x 2)     Status: Abnormal   Collection Time: 12/23/20  7:11 PM   Specimen: BLOOD  Result Value Ref Range Status   Specimen Description   Final    BLOOD RIGHT ANTECUBITAL Performed at [facility], [address] [place], [place] [address]    Special Requests   Final    BOTTLES DRAWN AEROBIC AND ANAEROBIC Blood Culture results may not be optimal due to an inadequate volume of blood received in culture bottles Performed at [facility], [address] [place], [place] [address]    Culture  Setup Time   Final    GRAM POSITIVE COCCI IN CHAINS IN CLUSTERS IN BOTH AEROBIC AND ANAEROBIC  BOTTLES CRITICAL RESULT CALLED TO, READ BACK BY AND VERIFIED WITH: PHARMD ANH PHAM 12/24/20 @2035  BY JW Performed at [facility], [address] [place], [place]    Culture (A)  Final    STREPTOCOCCUS MITIS/ORALIS STAPHYLOCOCCUS HOMINIS    Report Status 12/27/2020 FINAL  Final   Organism ID, Bacteria STREPTOCOCCUS MITIS/ORALIS  Final   Organism ID, Bacteria STAPHYLOCOCCUS HOMINIS  Final      Susceptibility   Staphylococcus hominis - MIC*    CIPROFLOXACIN >=8 RESISTANT Resistant     ERYTHROMYCIN >=8 RESISTANT Resistant     GENTAMICIN <=0.5 SENSITIVE Sensitive     OXACILLIN >=4 RESISTANT Resistant     TETRACYCLINE <=1 SENSITIVE Sensitive     VANCOMYCIN <=0.5 SENSITIVE Sensitive     TRIMETH/SULFA 80 RESISTANT Resistant     CLINDAMYCIN RESISTANT Resistant     RIFAMPIN <=0.5 SENSITIVE Sensitive     Inducible Clindamycin POSITIVE Resistant     * STAPHYLOCOCCUS HOMINIS   Streptococcus mitis/oralis - MIC*    TETRACYCLINE 0.5 SENSITIVE Sensitive     VANCOMYCIN 0.5 SENSITIVE Sensitive     CLINDAMYCIN <=0.25 SENSITIVE Sensitive     PENICILLIN Value in next row Sensitive      SENSITIVE0.06    CEFTRIAXONE Value in next row Sensitive      SENSITIVE0.12    * STREPTOCOCCUS MITIS/ORALIS  Blood Culture ID Panel (Reflexed)     Status: Abnormal   Collection Time: 12/23/20  7:11 PM  Result Value Ref Range Status   Enterococcus faecalis NOT DETECTED NOT DETECTED Final   Enterococcus Faecium NOT DETECTED NOT DETECTED Final   Listeria monocytogenes NOT DETECTED NOT DETECTED Final   Staphylococcus species DETECTED (A) NOT DETECTED Final    Comment: CRITICAL RESULT CALLED TO, READ BACK BY AND VERIFIED WITH: PHARMD ANH PHAM 12/24/20 @2035  BY JW    Staphylococcus aureus (BCID) NOT DETECTED NOT DETECTED Final   Staphylococcus epidermidis NOT DETECTED NOT DETECTED Final   Staphylococcus lugdunensis NOT DETECTED NOT DETECTED Final   Streptococcus species DETECTED (A) NOT DETECTED  Final  Comment: Not Enterococcus species, Streptococcus agalactiae, Streptococcus pyogenes, or Streptococcus pneumoniae. CRITICAL RESULT CALLED TO, READ BACK BY AND VERIFIED WITH: PHARMD ANH PHAM 12/24/20  BY JW    Streptococcus agalactiae NOT DETECTED NOT DETECTED Final   Streptococcus pneumoniae NOT DETECTED NOT DETECTED Final   Streptococcus pyogenes NOT DETECTED NOT DETECTED Final   A.calcoaceticus-baumannii NOT DETECTED NOT DETECTED Final   Bacteroides fragilis NOT DETECTED NOT DETECTED Final   Enterobacterales NOT DETECTED NOT DETECTED Final   Enterobacter cloacae complex NOT DETECTED NOT DETECTED Final   Escherichia coli NOT DETECTED NOT DETECTED Final   Klebsiella aerogenes NOT DETECTED NOT DETECTED Final   Klebsiella oxytoca NOT DETECTED NOT DETECTED Final   Klebsiella pneumoniae NOT DETECTED NOT DETECTED Final   Proteus species NOT DETECTED NOT DETECTED Final   Salmonella species NOT DETECTED NOT DETECTED Final   Serratia marcescens NOT DETECTED NOT DETECTED Final   Haemophilus influenzae NOT DETECTED NOT DETECTED Final   Neisseria meningitidis NOT DETECTED NOT DETECTED Final   Pseudomonas aeruginosa NOT DETECTED NOT DETECTED Final   Stenotrophomonas maltophilia NOT DETECTED NOT DETECTED Final   Candida albicans NOT DETECTED NOT DETECTED Final   Candida auris NOT DETECTED NOT DETECTED Final   Candida glabrata NOT DETECTED NOT DETECTED Final   Candida krusei NOT DETECTED NOT DETECTED Final   Candida parapsilosis NOT DETECTED NOT DETECTED Final   Candida tropicalis NOT DETECTED NOT DETECTED Final   Cryptococcus neoformans/gattii NOT DETECTED NOT DETECTED Final    Comment: Performed at [facility], [address], [place], [place] [address]  Aerobic Culture w Gram Stain (superficial specimen)     Status: None   Collection Time: 12/23/20  9:34 PM   Specimen: RECTAL SWAB  Result Value Ref Range Status   Specimen Description   Final    RECTAL SWAB Performed at  [facility], [address], [place], [place] [address]    Special Requests   Final    NONE Performed at [facility], [address], [place], [place] [address]    Gram Stain   Final    RARE WBC PRESENT,BOTH PMN AND MONONUCLEAR FEW GRAM POSITIVE COCCI IN PAIRS RARE GRAM POSITIVE RODS FEW GRAM NEGATIVE RODS    Culture   Final    FEW MULTIPLE ORGANISMS PRESENT, NONE PREDOMINANT NO GROUP A STREP (S.PYOGENES) ISOLATED NO STAPHYLOCOCCUS AUREUS ISOLATED Performed at [facility], [address] [place], [place] [address]    Report Status 12/26/2020 FINAL  Final  Culture, blood (routine x 2)     Status: None (Preliminary result)   Collection Time: 12/26/20  9:34 AM   Specimen: BLOOD  Result Value Ref Range Status   Specimen Description   Final    BLOOD RIGHT ANTECUBITAL Performed at [facility], [address] [place], [place] [address]    Special Requests   Final    BOTTLES DRAWN AEROBIC ONLY Blood Culture adequate volume Performed at [facility], [address] [place], [place] [address]    Culture   Final    NO GROWTH 2 DAYS Performed at [facility], [address] [place], [place] [address]    Report Status PENDING  Incomplete  Culture, blood (routine x 2)     Status: None (Preliminary result)   Collection Time: 12/26/20  9:35 AM   Specimen: BLOOD LEFT HAND  Result Value Ref Range Status   Specimen Description   Final  BLOOD LEFT HAND Performed at [facility], [address] [place], [place] [address]    Special Requests   Final    BOTTLES DRAWN AEROBIC ONLY Blood Culture adequate volume Performed at [facility], [address], [place], [place] [address]    Culture   Final    NO GROWTH 2 DAYS Performed at [facility], [address], [place], [place] [address]    Report Status PENDING  Incomplete       Radiology  Studies: No results found.    Scheduled Meds:  feeding supplement (GLUCERNA SHAKE)  237 mL Oral TID BM   fludrocortisone  0.05 mg Oral QHS   hydrocortisone  100 mg Rectal QHS   insulin aspart  0-15 Units Subcutaneous TID AC & HS   insulin aspart  4 Units Subcutaneous TID WC   insulin glargine-yfgn  18 Units Subcutaneous q AM   levothyroxine  100 mcg Oral Q0600   memantine  10 mg Oral Q12H   multivitamin with minerals  1 tablet Oral Daily   polyethylene glycol  17 g Oral QID   predniSONE  5 mg Oral Q breakfast   QUEtiapine  25 mg Oral QHS   rosuvastatin  5 mg Oral QHS   sodium chloride flush  3 mL Intravenous Q12H   tacrolimus  0.5 mg Oral Daily   Continuous Infusions:     LOS: 6 days      Time spent: 20 minutes   Noralee Stain, DO [facility] 12/29/2020, 12:10 PM   Available via Epic secure chat 7am-7pm After these hours, please refer to coverage provider listed on [email or web address]

## 2020-12-30 ENCOUNTER — Encounter (HOSPITAL_COMMUNITY): Payer: Self-pay | Admitting: Gastroenterology

## 2020-12-30 DIAGNOSIS — A419 Sepsis, unspecified organism: Secondary | ICD-10-CM | POA: Diagnosis not present

## 2020-12-30 LAB — GLUCOSE, CAPILLARY: Glucose-Capillary: 224 mg/dL — ABNORMAL HIGH (ref 70–99)

## 2020-12-30 LAB — SARS CORONAVIRUS 2 BY RT PCR (HOSPITAL ORDER, PERFORMED IN ~~LOC~~ HOSPITAL LAB): SARS Coronavirus 2: NEGATIVE

## 2020-12-30 MED ORDER — HYDROCORTISONE 100 MG/60ML RE ENEM
100.0000 mg | ENEMA | Freq: Every day | RECTAL | 0 refills | Status: DC
Start: 2020-12-30 — End: 2020-12-30

## 2020-12-30 MED ORDER — HYDROCORTISONE 100 MG/60ML RE ENEM: 100.0000 mg | ENEMA | Freq: Every day | RECTAL | 0 refills | Status: DC

## 2020-12-30 NOTE — Progress Notes (Signed)
Pt discharge education and instructions completed. Pt discharged to ALF and facility to come pick up pt. Report called off to nurse Arline Asp at the facility. Discharge RN called to get pt ready for discharge. Dionne Bucy RN

## 2020-12-30 NOTE — Progress Notes (Addendum)
Subjective: No new complaints   Antibiotics:  Anti-infectives (From admission, onward)    Start     Dose/Rate Route Frequency Ordered Stop   12/25/20 2200  metroNIDAZOLE (FLAGYL) tablet 500 mg  Status:  Discontinued        500 mg Oral Every 12 hours 12/25/20 1420 12/27/20 1012   12/23/20 2300  metroNIDAZOLE (FLAGYL) IVPB 500 mg  Status:  Discontinued        500 mg 100 mL/hr over 60 Minutes Intravenous Every 12 hours 12/23/20 2245 12/25/20 1420   12/23/20 2045  cefTRIAXone (ROCEPHIN) 2 g in sodium chloride 0.9 % 100 mL IVPB  Status:  Discontinued        2 g 200 mL/hr over 30 Minutes Intravenous Every 24 hours 12/23/20 2040 12/27/20 1012   12/23/20 2045  azithromycin (ZITHROMAX) 500 mg in sodium chloride 0.9 % 250 mL IVPB  Status:  Discontinued        500 mg 250 mL/hr over 60 Minutes Intravenous Every 24 hours 12/23/20 2040 12/25/20 1420       Medications: Scheduled Meds:  feeding supplement (GLUCERNA SHAKE)  237 mL Oral TID BM   fludrocortisone  0.05 mg Oral QHS   hydrocortisone  100 mg Rectal QHS   insulin aspart  0-15 Units Subcutaneous TID AC & HS   insulin aspart  4 Units Subcutaneous TID WC   insulin glargine-yfgn  18 Units Subcutaneous q AM   levothyroxine  100 mcg Oral Q0600   memantine  10 mg Oral Q12H   multivitamin with minerals  1 tablet Oral Daily   polyethylene glycol  17 g Oral QID   predniSONE  5 mg Oral Q breakfast   QUEtiapine  25 mg Oral QHS   rosuvastatin  5 mg Oral QHS   sodium chloride flush  3 mL Intravenous Q12H   tacrolimus  0.5 mg Oral Daily   Continuous Infusions: PRN Meds:.guaiFENesin, hydrALAZINE, HYDROmorphone (DILAUDID) injection, ipratropium-albuterol, metoprolol tartrate, ondansetron **OR** ondansetron (ZOFRAN) IV, promethazine, senna-docusate, traZODone    Objective: Weight change:   Intake/Output Summary (Last 24 hours) at 12/30/2020 1045 Last data filed at 12/29/2020 1429 Gross per 24 hour  Intake 240 ml  Output --   Net 240 ml   Blood pressure (!) 119/48, pulse 81, temperature 98 F (36.7 C), temperature source Oral, resp. rate 18, height 4' 11.84" (1.52 m), weight 66 kg, SpO2 97 %. Temp:  [98 F (36.7 C)-98.8 F (37.1 C)] 98 F (36.7 C) (12/19 0820) Pulse Rate:  [71-84] 81 (12/19 0820) Resp:  [17-20] 18 (12/19 0820) BP: (119-197)/(48-86) 119/48 (12/19 0820) SpO2:  [94 %-97 %] 97 % (12/19 0820)  Physical Exam: Physical Exam Constitutional:      General: She is not in acute distress.    Appearance: She is well-developed. She is not diaphoretic.  HENT:     Head: Normocephalic and atraumatic.     Right Ear: External ear normal.     Left Ear: External ear normal.     Mouth/Throat:     Pharynx: No oropharyngeal exudate.  Eyes:     General: No scleral icterus.    Conjunctiva/sclera: Conjunctivae normal.     Pupils: Pupils are equal, round, and reactive to light.  Cardiovascular:     Rate and Rhythm: Normal rate and regular rhythm.  Pulmonary:     Effort: Pulmonary effort is normal. No respiratory distress.     Breath sounds: No wheezing.  Abdominal:  General: There is no distension.     Palpations: Abdomen is soft.  Musculoskeletal:        General: No tenderness. Normal range of motion.  Lymphadenopathy:     Cervical: No cervical adenopathy.  Skin:    General: Skin is warm and dry.     Coloration: Skin is not pale.     Findings: No erythema or rash.  Neurological:     General: No focal deficit present.     Mental Status: She is alert.     Motor: No abnormal muscle tone.  Psychiatric:        Cognition and Memory: Memory is impaired. She exhibits impaired recent memory and impaired remote memory.     CBC:    BMET Recent Labs    12/28/20 0550  NA 142  K 3.9  CL 107  CO2 26  GLUCOSE 85  BUN 6*  CREATININE 0.75  CALCIUM 9.4     Liver Panel  No results for input(s): PROT, ALBUMIN, AST, ALT, ALKPHOS, BILITOT, BILIDIR, IBILI in the last 72  hours.     Sedimentation Rate No results for input(s): ESRSEDRATE in the last 72 hours. C-Reactive Protein No results for input(s): CRP in the last 72 hours.  Micro Results: Recent Results (from the past 720 hour(s))  Urine Culture     Status: Abnormal   Collection Time: 12/23/20  2:30 AM   Specimen: Urine, Clean Catch  Result Value Ref Range Status   Specimen Description   Final    URINE, CLEAN CATCH Performed at [facility], [place] [address] [place], [place] [address]    Special Requests   Final    NONE Performed at [facility], [place] [address] [place], [place] [address]    Culture >=100,000 COLONIES/mL AEROCOCCUS URINAE (A)  Final   Report Status 12/26/2020 FINAL  Final  Resp Panel by RT-PCR (Flu A&B, Covid) Nasopharyngeal Swab     Status: None   Collection Time: 12/23/20  6:59 PM   Specimen: Nasopharyngeal Swab; Nasopharyngeal(NP) swabs in vial transport medium  Result Value Ref Range Status   SARS Coronavirus 2 by RT PCR NEGATIVE NEGATIVE Final    Comment: (NOTE) SARS-CoV-2 target nucleic acids are NOT DETECTED.  The SARS-CoV-2 RNA is generally detectable in upper respiratory specimens during the acute phase of infection. The lowest concentration of SARS-CoV-2 viral copies this assay can detect is 138 copies/mL. A negative result does not preclude SARS-Cov-2 infection and should not be used as the sole basis for treatment or other patient management decisions. A negative result may occur with  improper specimen collection/handling, submission of specimen other than nasopharyngeal swab, presence of viral mutation(s) within the areas targeted by this assay, and inadequate number of viral copies(<138 copies/mL). A negative result must be combined with clinical observations, patient history, and epidemiological information. The expected result is Negative.  Fact Sheet for Patients:   EntrepreneurPulse.com.au  Fact Sheet for Healthcare Providers:  IncredibleEmployment.be  This test is no t yet approved or cleared by the [place] FDA and  has been authorized for detection and/or diagnosis of SARS-CoV-2 by FDA under an Emergency Use Authorization (EUA). This EUA will remain  in effect (meaning this test can be used) for the duration of the COVID-19 declaration under Section 564(b)(1) of the Act, 21 U.S.C.section 360bbb-3(b)(1), unless the authorization is terminated  or revoked sooner.       Influenza A by PCR NEGATIVE NEGATIVE Final   Influenza  B by PCR NEGATIVE NEGATIVE Final    Comment: (NOTE) The Xpert Xpress SARS-CoV-2/FLU/RSV plus assay is intended as an aid in the diagnosis of influenza from Nasopharyngeal swab specimens and should not be used as a sole basis for treatment. Nasal washings and aspirates are unacceptable for Xpert Xpress SARS-CoV-2/FLU/RSV testing.  Fact Sheet for Patients: BloggerCourse.com  Fact Sheet for Healthcare Providers: SeriousBroker.it  This test is not yet approved or cleared by the [place] FDA and has been authorized for detection and/or diagnosis of SARS-CoV-2 by FDA under an Emergency Use Authorization (EUA). This EUA will remain in effect (meaning this test can be used) for the duration of the COVID-19 declaration under Section 564(b)(1) of the Act, 21 U.S.C. section 360bbb-3(b)(1), unless the authorization is terminated or revoked.  Performed at [facility], [address] [place], [place] [address]   Blood Culture (routine x 2)     Status: Abnormal   Collection Time: 12/23/20  7:06 PM   Specimen: BLOOD  Result Value Ref Range Status   Specimen Description   Final    BLOOD RIGHT ANTECUBITAL Performed at [facility], [address], [place], [place] [address]    Special Requests    Final    BOTTLES DRAWN AEROBIC AND ANAEROBIC Blood Culture results may not be optimal due to an inadequate volume of blood received in culture bottles Performed at [facility], [address] [place], [place] [address]    Culture  Setup Time   Final    GRAM POSITIVE COCCI AEROBIC BOTTLE ONLY CRITICAL VALUE NOTED.  VALUE IS CONSISTENT WITH PREVIOUSLY REPORTED AND CALLED VALUE.    Culture (A)  Final    STAPHYLOCOCCUS HOMINIS SUSCEPTIBILITIES PERFORMED ON PREVIOUS CULTURE WITHIN THE LAST 5 DAYS. Performed at [facility], [address] [place], [place] [address]    Report Status 12/27/2020 FINAL  Final  Blood Culture (routine x 2)     Status: Abnormal   Collection Time: 12/23/20  7:11 PM   Specimen: BLOOD  Result Value Ref Range Status   Specimen Description   Final    BLOOD RIGHT ANTECUBITAL Performed at [facility], [address] [place], [place] [address]    Special Requests   Final    BOTTLES DRAWN AEROBIC AND ANAEROBIC Blood Culture results may not be optimal due to an inadequate volume of blood received in culture bottles Performed at [facility], [address], [place], [place] [address]    Culture  Setup Time   Final    GRAM POSITIVE COCCI IN CHAINS IN CLUSTERS IN BOTH AEROBIC AND ANAEROBIC BOTTLES CRITICAL RESULT CALLED TO, READ BACK BY AND VERIFIED WITH: PHARMD ANH PHAM 12/24/20 @2035  BY JW Performed at [facility], [address] [place], [place] [address]    Culture (A)  Final    STREPTOCOCCUS MITIS/ORALIS STAPHYLOCOCCUS HOMINIS    Report Status 12/27/2020 FINAL  Final   Organism ID, Bacteria STREPTOCOCCUS MITIS/ORALIS  Final   Organism ID, Bacteria STAPHYLOCOCCUS HOMINIS  Final      Susceptibility   Staphylococcus hominis - MIC*    CIPROFLOXACIN >=8 RESISTANT Resistant     ERYTHROMYCIN >=8 RESISTANT Resistant     GENTAMICIN <=0.5 SENSITIVE Sensitive     OXACILLIN >=4 RESISTANT  Resistant     TETRACYCLINE <=1 SENSITIVE Sensitive     VANCOMYCIN <=0.5 SENSITIVE Sensitive     TRIMETH/SULFA 80 RESISTANT Resistant  CLINDAMYCIN RESISTANT Resistant     RIFAMPIN <=0.5 SENSITIVE Sensitive     Inducible Clindamycin POSITIVE Resistant     * STAPHYLOCOCCUS HOMINIS   Streptococcus mitis/oralis - MIC*    TETRACYCLINE 0.5 SENSITIVE Sensitive     VANCOMYCIN 0.5 SENSITIVE Sensitive     CLINDAMYCIN <=0.25 SENSITIVE Sensitive     PENICILLIN Value in next row Sensitive      SENSITIVE0.06    CEFTRIAXONE Value in next row Sensitive      SENSITIVE0.12    * STREPTOCOCCUS MITIS/ORALIS  Blood Culture ID Panel (Reflexed)     Status: Abnormal   Collection Time: 12/23/20  7:11 PM  Result Value Ref Range Status   Enterococcus faecalis NOT DETECTED NOT DETECTED Final   Enterococcus Faecium NOT DETECTED NOT DETECTED Final   Listeria monocytogenes NOT DETECTED NOT DETECTED Final   Staphylococcus species DETECTED (A) NOT DETECTED Final    Comment: CRITICAL RESULT CALLED TO, READ BACK BY AND VERIFIED WITH: PHARMD ANH PHAM 12/24/20 @2035  BY JW    Staphylococcus aureus (BCID) NOT DETECTED NOT DETECTED Final   Staphylococcus epidermidis NOT DETECTED NOT DETECTED Final   Staphylococcus lugdunensis NOT DETECTED NOT DETECTED Final   Streptococcus species DETECTED (A) NOT DETECTED Final    Comment: Not Enterococcus species, Streptococcus agalactiae, Streptococcus pyogenes, or Streptococcus pneumoniae. CRITICAL RESULT CALLED TO, READ BACK BY AND VERIFIED WITH: PHARMD ANH PHAM 12/24/20 @2035  BY JW    Streptococcus agalactiae NOT DETECTED NOT DETECTED Final   Streptococcus pneumoniae NOT DETECTED NOT DETECTED Final   Streptococcus pyogenes NOT DETECTED NOT DETECTED Final   A.calcoaceticus-baumannii NOT DETECTED NOT DETECTED Final   Bacteroides fragilis NOT DETECTED NOT DETECTED Final   Enterobacterales NOT DETECTED NOT DETECTED Final   Enterobacter cloacae complex NOT DETECTED NOT DETECTED  Final   Escherichia coli NOT DETECTED NOT DETECTED Final   Klebsiella aerogenes NOT DETECTED NOT DETECTED Final   Klebsiella oxytoca NOT DETECTED NOT DETECTED Final   Klebsiella pneumoniae NOT DETECTED NOT DETECTED Final   Proteus species NOT DETECTED NOT DETECTED Final   Salmonella species NOT DETECTED NOT DETECTED Final   Serratia marcescens NOT DETECTED NOT DETECTED Final   Haemophilus influenzae NOT DETECTED NOT DETECTED Final   Neisseria meningitidis NOT DETECTED NOT DETECTED Final   Pseudomonas aeruginosa NOT DETECTED NOT DETECTED Final   Stenotrophomonas maltophilia NOT DETECTED NOT DETECTED Final   Candida albicans NOT DETECTED NOT DETECTED Final   Candida auris NOT DETECTED NOT DETECTED Final   Candida glabrata NOT DETECTED NOT DETECTED Final   Candida krusei NOT DETECTED NOT DETECTED Final   Candida parapsilosis NOT DETECTED NOT DETECTED Final   Candida tropicalis NOT DETECTED NOT DETECTED Final   Cryptococcus neoformans/gattii NOT DETECTED NOT DETECTED Final    Comment: Performed at [facility], [address], [place], [place] [address]  Aerobic Culture w Gram Stain (superficial specimen)     Status: None   Collection Time: 12/23/20  9:34 PM   Specimen: RECTAL SWAB  Result Value Ref Range Status   Specimen Description   Final    RECTAL SWAB Performed at [place] [address], [place], [place] [address]    Special Requests   Final    NONE Performed at [facility], [place] [address], [place], [place] [address]    Gram Stain   Final    RARE WBC PRESENT,BOTH PMN AND MONONUCLEAR FEW GRAM POSITIVE COCCI IN PAIRS RARE GRAM POSITIVE RODS FEW GRAM NEGATIVE  RODS    Culture   Final    FEW MULTIPLE ORGANISMS PRESENT, NONE PREDOMINANT NO GROUP A STREP (S.PYOGENES) ISOLATED NO STAPHYLOCOCCUS AUREUS ISOLATED Performed at [facility], [place] [address] [place], [place] [address]    Report Status 12/26/2020 FINAL  Final   Culture, blood (routine x 2)     Status: None (Preliminary result)   Collection Time: 12/26/20  9:34 AM   Specimen: BLOOD  Result Value Ref Range Status   Specimen Description   Final    BLOOD RIGHT ANTECUBITAL Performed at [place] [address] [place], [place] [address]    Special Requests   Final    BOTTLES DRAWN AEROBIC ONLY Blood Culture adequate volume Performed at [place] [address] [place], [place] [address]    Culture   Final    NO GROWTH 4 DAYS Performed at [facility], [place] [address], [place], [place] [address]    Report Status PENDING  Incomplete  Culture, blood (routine x 2)     Status: None (Preliminary result)   Collection Time: 12/26/20  9:35 AM   Specimen: BLOOD LEFT HAND  Result Value Ref Range Status   Specimen Description   Final    BLOOD LEFT HAND Performed at [place] [address], [place], [place] [address]    Special Requests   Final    BOTTLES DRAWN AEROBIC ONLY Blood Culture adequate volume Performed at [place] [address] [place], [place] [address]    Culture   Final    NO GROWTH 4 DAYS Performed at [facility], [place] [address] [place], [place] [address]    Report Status PENDING  Incomplete  SARS Coronavirus 2 by RT PCR (hospital order, performed in [place] hospital lab)     Status: None   Collection Time: 12/29/20  2:35 PM  Result Value Ref Range Status   SARS Coronavirus 2 NEGATIVE NEGATIVE Final    Comment: (NOTE) SARS-CoV-2 target nucleic acids are NOT DETECTED.  The SARS-CoV-2 RNA is generally detectable in upper and lower respiratory specimens during the acute phase of infection. The lowest concentration of SARS-CoV-2 viral copies this assay can detect is 250 copies / mL. A negative result does not preclude SARS-CoV-2 infection and should not be used as the sole basis for treatment or other patient management  decisions.  A negative result may occur with improper specimen collection / handling, submission of specimen other than nasopharyngeal swab, presence of viral mutation(s) within the areas targeted by this assay, and inadequate number of viral copies (<250 copies / mL). A negative result must be combined with clinical observations, patient history, and epidemiological information.  Fact Sheet for Patients:   StrictlyIdeas.no  Fact Sheet for Healthcare Providers: BankingDealers.co.za  This test is not yet approved or  cleared by the [place] FDA and has been authorized for detection and/or diagnosis of SARS-CoV-2 by FDA under an Emergency Use Authorization (EUA).  This EUA will remain in effect (meaning this test can be used) for the duration of the COVID-19 declaration under Section 564(b)(1) of the Act, 21 U.S.C. section 360bbb-3(b)(1), unless the authorization is terminated or revoked sooner.  Performed at [facility], [place] [address], [place], [place] [address]     Studies/Results: No results found.    Assessment/Plan:  INTERVAL HISTORY: Patient has been stable off antibiotics   Principal Problem:   Severe sepsis (  [place]) Active Problems:   History of renal transplant   Insulin dependent type 2 diabetes mellitus (HCC)   Dementia (HCC)   Hypothyroidism   Proctitis   Bacteremia    Emma Maldonado is a 70 y.o. 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  Blood cultures drawn which grew a Staph hominis from 2 cultures taken from the same hand as well as one culture that was positive with 2 with Streptococcus mitis.  She was on ceftriaxone and metronidazole for possible proctitis seen on CT scan that the patient was not complaining of rectal pain or having diarrhea.  Despite being on antibiotics  that would not be active against her Staph hominis (was methicillin-resistant) repeat blood cultures were sterile consistent with the former cultures representing contamination.  Antibiotics have been stopped.  She underwent flexible sigmoidoscopy which revealed inflammation in the rectum but without any mass lesions.  We have been observing her off antibiotics and she is remained afebrile.  She is being discharged today.     LOS: 7 days   Alcide Evener 12/30/2020, 10:45 AM

## 2020-12-30 NOTE — TOC Transition Note (Signed)
Transition of Care [facility]) - CM/SW Discharge Note   Patient Details  Name: Emma Maldonado MRN: [id number] Date of Birth: 1950/04/02  [facility] CM/SW Contact:  Ida Rogue, LCSW Phone Number: 12/30/2020, 10:44 AM   Clinical Narrative:   Patient who is stable for d/c will return to [facility] today.  Family informed. Ride arranged via facility.  Nursing, please call report to [phone]. TOC sign off.    Final next level of care: Assisted Living Barriers to Discharge: No Barriers Identified   Patient Goals and CMS Choice        Discharge Placement                       Discharge Plan and Services                                     Social Determinants of Health (SDOH) Interventions     Readmission Risk Interventions No flowsheet data found.

## 2020-12-30 NOTE — Discharge Summary (Signed)
Physician Discharge Summary  Emma Maldonado [facility] ZOX:[id number] DOB: 1950-04-12 DOA: 12/23/2020  PCP: Patient, No Pcp Per (Inactive)  Admit date: 12/23/2020 Discharge date: 12/30/2020  Admitted From: Memory care unit Disposition:  Memory care unit   Recommendations for Outpatient Follow-up:  Follow up with GI as needed   Discharge Condition: Stable CODE STATUS: Full  Diet recommendation:  Diet Orders (From admission, onward)     Start     Ordered   12/28/20 1253  Diet Carb Modified Fluid consistency: Thin; Room service appropriate? Yes  Diet effective now       Question Answer Comment  Diet-HS Snack? Nothing   Calorie Level Medium 1600-2000   Fluid consistency: Thin   Room service appropriate? Yes      12/28/20 1252           Brief/Interim Summary: Emma Maldonado is a 70 year old Spanish-speaking female with insulin-dependent diabetes, HTN, HLD, status post living donor renal transplant 1997 on chronic immunosuppression, hypothyroidism, orthostatic hypotension on Florinef, advanced Lewy body dementia resides at memory care unit, who was admitted to the hospital for altered mental status.  On the day of admission, patient was noted to be febrile to 103F, confused, weak and having chills.  Upon admission she was in sepsis with tachycardia, febrile, tachypnea.  CT abdomen pelvis showed concerns for proctitis and lower lobe pneumonia.  Patient was started on IV Rocephin, azithromycin and Flagyl along with fluids. Blood cultures with staph hominis in both bottles and strep mitis. ID consulted.  GI was also consulted for CT abdomen and pelvis findings, underwent flexible sigmoidoscopy 12/17, which revealed severe inflammation in the rectum without any mass lesion.  Mild inflammation in the rectosigmoid colon.  She was started on Cortenema.    Discharge Diagnoses:  Principal Problem:   Severe sepsis (HCC) Active Problems:   History of renal transplant   Insulin dependent type  2 diabetes mellitus (HCC)   Dementia (HCC)   Hypothyroidism   Proctitis   Bacteremia  Severe sepsis, POA, secondary to proctitis -Infectious disease following, was on Rocephin and Flagyl, but previous Staphylococcus hominis found to be oxacillin resistant indicating ceftriaxone would not have been effective. At this point supports likely contamination given no growth on cultures to this point.  -Repeat blood cultures obtained 12/15 negative to date  -GI consulted, flexible sigmoidoscopy 12/17 found severe inflammation in the rectum without any mass lesion, mild inflammation in the rectosigmoid colon.  Started Cortenema  Acute metabolic encephalopathy on chronic Lewy body dementia -Secondary to above, seems to be at her baseline now -Continue Namenda, Seroquel  History of living donor renal transplant 1997 -Continue prednisone, Prograf   Diabetes mellitus type 2, hyperglycemia -Continue Semglee, sliding scale insulin  Chronic orthostatic hypotension -Continue Florinef   Hyperlipidemia -Continue Crestor  Hypothyroidism -Continue Synthroid  Discharge Instructions  Discharge Instructions     Increase activity slowly   Complete by: As directed       Allergies as of 12/30/2020   No Known Allergies      Medication List     TAKE these medications    acetaminophen 500 MG tablet Commonly known as: TYLENOL Take 500 mg by mouth every 6 (six) hours as needed for fever or headache.   alum & mag hydroxide-simeth 200-200-20 MG/5ML suspension Commonly known as: MAALOX/MYLANTA Take 30 mLs by mouth every 6 (six) hours as needed for indigestion or heartburn.   fludrocortisone 0.1 MG tablet Commonly known as: FLORINEF Take 0.1 mg by mouth at  bedtime.   guaifenesin 100 MG/5ML syrup Commonly known as: ROBITUSSIN Take 200 mg by mouth every 6 (six) hours as needed for cough.   hydrocortisone 100 MG/60ML enema Commonly known as: CORTENEMA Place 1 enema (100 mg total) rectally  at bedtime for 14 days.   insulin aspart 100 UNIT/ML injection Commonly known as: novoLOG Inject 20 Units into the skin See admin instructions. Inject 10 units into the skin three times a day after meals and hold if not eating   Lantus SoloStar 100 UNIT/ML Solostar Pen Generic drug: insulin glargine Inject 20 Units into the skin in the morning.   levothyroxine 100 MCG tablet Commonly known as: SYNTHROID Take 100 mcg by mouth daily before breakfast.   loperamide 2 MG capsule Commonly known as: IMODIUM Take 2 mg by mouth as needed (with each loose stool for diarrhea- cannot exceed 8 doses a day).   magnesium hydroxide 400 MG/5ML suspension Commonly known as: MILK OF MAGNESIA Take 30 mLs by mouth at bedtime as needed for mild constipation.   memantine 10 MG tablet Commonly known as: NAMENDA Take 10 mg by mouth every 12 (twelve) hours.   multivitamin with minerals Tabs tablet Take 1 tablet by mouth daily.   neomycin-bacitracin-polymyxin [phone] ointment Apply 1 application topically as needed (for minor skin tears or abrasions, after cleaning with normal saline- bandage with gauze and tape).   predniSONE 5 MG tablet Commonly known as: DELTASONE Take 5 mg by mouth every morning.   QUEtiapine 25 MG tablet Commonly known as: SEROQUEL Take 25 mg by mouth at bedtime.   rosuvastatin 5 MG tablet Commonly known as: CRESTOR Take 5 mg by mouth at bedtime.   tacrolimus 0.5 MG capsule Commonly known as: PROGRAF Take 1 capsule (0.5 mg total) by mouth daily.   Vitamin D3 50 MCG (2000 UT) Tabs Take 2,000 Units by mouth daily.        Follow-up Information     Kathi Der, MD Follow up.   Specialty: Gastroenterology Why: As needed, If symptoms worsen Contact information: [address] [place] [address] [phone]                No Known Allergies  Consultations: GI ID    Procedures/Studies: CT ABDOMEN PELVIS W CONTRAST  Result  Date: 12/23/2020 CLINICAL DATA:  Sepsis possible rectal abscess EXAM: CT ABDOMEN AND PELVIS WITH CONTRAST TECHNIQUE: Multidetector CT imaging of the abdomen and pelvis was performed using the standard protocol following bolus administration of intravenous contrast. CONTRAST:  80mL OMNIPAQUE IOHEXOL 350 MG/ML SOLN COMPARISON:  CT 09/09/2020, 05/01/2020 FINDINGS: Lower chest: Lung bases demonstrate new airspace consolidation at the right greater than left lung base with mild bronchial wall thickening, suspicious for pneumonia or aspiration. No pleural effusion. Stable cardiac size. Hepatobiliary: Status post cholecystectomy. No intra hepatic biliary dilatation. Stable prominence of the common bile duct. Pancreas: Atrophic.  No inflammatory changes. Spleen: Normal in size without focal abnormality. Adrenals/Urinary Tract: Adrenal glands are normal. Atrophic native kidneys with vascular calcification. Right lower quadrant transplanted kidney with several small cysts. Mild right hydronephrosis. Mild urothelial thickening and possible narrowing at the ureteral implantation site at the anterior bladder, series 2, image 78. Stomach/Bowel: The stomach is nonenlarged. No dilated small bowel. Diverticular disease of the left colon without acute wall thickening. Circumferential wall thickening of the rectum with mild presacral soft tissue stranding. No organized perirectal abscess. Mild soft tissue infiltration of the gluteal soft tissues but without organized abscess. Vascular/Lymphatic: Moderate  aortic atherosclerosis. No aneurysm. No suspicious nodes Reproductive: Status post hysterectomy. No adnexal masses. Other: Negative for pelvic effusion or free air. Fat fluid level in the subcutaneous fat of the right abdominal wall. Soft tissue infiltration and calcifications of the subcutaneous fat of the anterior abdominal wall as before. Musculoskeletal: No acute osseous abnormality. Healing posterior rib fractures. Healing right  first second third and fourth transverse process fractures. IMPRESSION: 1. Circumferential thickening of the rectum with presacral edema and mild soft tissue stranding suggestive of proctitis. No organized perirectal abscess. Correlate with direct inspection to exclude wall thickening secondary to neoplasm/mass. 2. Right lower quadrant transplant kidney with mild right hydronephrosis. Mild urothelial thickening and enhancement of the distal ureter with possible narrowing at implantation site of distal ureter. Similar appearance on CT from April 2022. Suggest correlation with urinalysis to exclude ascending urinary tract infection. 3. Patchy airspace disease at the right greater than left lung base suspicious for pneumonia or aspiration Electronically Signed   By: Jasmine Pang M.D.   On: 12/23/2020 22:25   DG Chest Port 1 View  Result Date: 12/23/2020 CLINICAL DATA:  Multiple status, code sepsis EXAM: PORTABLE CHEST 1 VIEW COMPARISON:  08/22/2020 FINDINGS: Lungs are clear.  No pleural effusion or pneumothorax. The heart is normal in size. IMPRESSION: No evidence of acute cardiopulmonary disease. Electronically Signed   By: Charline Bills M.D.   On: 12/23/2020 19:43       Discharge Exam: Vitals:   12/30/20 0353 12/30/20 0820  BP: (!) 197/86 (!) 119/48  Pulse: 71 81  Resp:  18  Temp: 98.8 F (37.1 C) 98 F (36.7 C)  SpO2: 94% 97%    General: Pt is alert, awake, not in acute distress Cardiovascular: RRR, S1/S2 +, no edema Respiratory: CTA bilaterally, no wheezing, no rhonchi, no respiratory distress, no conversational dyspnea  Abdominal: Soft, NT, ND, bowel sounds + Extremities: no edema, no cyanosis Psych: Normal mood and affect   The results of significant diagnostics from this hospitalization (including imaging, microbiology, ancillary and laboratory) are listed below for reference.     Microbiology: Recent Results (from the past 240 hour(s))  Urine Culture     Status: Abnormal    Collection Time: 12/23/20  2:30 AM   Specimen: Urine, Clean Catch  Result Value Ref Range Status   Specimen Description   Final    URINE, CLEAN CATCH Performed at [facility], [address] [place], [place] [address]    Special Requests   Final    NONE Performed at [facility], [address] [place], [place] [address]    Culture >=100,000 COLONIES/mL AEROCOCCUS URINAE (A)  Final   Report Status 12/26/2020 FINAL  Final  Resp Panel by RT-PCR (Flu A&B, Covid) Nasopharyngeal Swab     Status: None   Collection Time: 12/23/20  6:59 PM   Specimen: Nasopharyngeal Swab; Nasopharyngeal(NP) swabs in vial transport medium  Result Value Ref Range Status   SARS Coronavirus 2 by RT PCR NEGATIVE NEGATIVE Final    Comment: (NOTE) SARS-CoV-2 target nucleic acids are NOT DETECTED.  The SARS-CoV-2 RNA is generally detectable in upper respiratory specimens during the acute phase of infection. The lowest concentration of SARS-CoV-2 viral copies this assay can detect is 138 copies/mL. A negative result does not preclude SARS-Cov-2 infection and should not be used as the sole basis for treatment or other patient management decisions. A negative result may occur with  improper specimen collection/handling, submission of specimen other  than nasopharyngeal swab, presence of viral mutation(s) within the areas targeted by this assay, and inadequate number of viral copies(<138 copies/mL). A negative result must be combined with clinical observations, patient history, and epidemiological information. The expected result is Negative.  Fact Sheet for Patients:  BloggerCourse.com  Fact Sheet for Healthcare Providers:  SeriousBroker.it  This test is no t yet approved or cleared by the [place] FDA and  has been authorized for detection and/or diagnosis of SARS-CoV-2 by FDA under an Emergency Use Authorization  (EUA). This EUA will remain  in effect (meaning this test can be used) for the duration of the COVID-19 declaration under Section 564(b)(1) of the Act, 21 U.S.C.section 360bbb-3(b)(1), unless the authorization is terminated  or revoked sooner.       Influenza A by PCR NEGATIVE NEGATIVE Final   Influenza B by PCR NEGATIVE NEGATIVE Final    Comment: (NOTE) The Xpert Xpress SARS-CoV-2/FLU/RSV plus assay is intended as an aid in the diagnosis of influenza from Nasopharyngeal swab specimens and should not be used as a sole basis for treatment. Nasal washings and aspirates are unacceptable for Xpert Xpress SARS-CoV-2/FLU/RSV testing.  Fact Sheet for Patients: BloggerCourse.com  Fact Sheet for Healthcare Providers: SeriousBroker.it  This test is not yet approved or cleared by the [place] FDA and has been authorized for detection and/or diagnosis of SARS-CoV-2 by FDA under an Emergency Use Authorization (EUA). This EUA will remain in effect (meaning this test can be used) for the duration of the COVID-19 declaration under Section 564(b)(1) of the Act, 21 U.S.C. section 360bbb-3(b)(1), unless the authorization is terminated or revoked.  Performed at [facility], [address] [place], [place] [address]   Blood Culture (routine x 2)     Status: Abnormal   Collection Time: 12/23/20  7:06 PM   Specimen: BLOOD  Result Value Ref Range Status   Specimen Description   Final    BLOOD RIGHT ANTECUBITAL Performed at [facility], [address], [place], [place] [address]    Special Requests   Final    BOTTLES DRAWN AEROBIC AND ANAEROBIC Blood Culture results may not be optimal due to an inadequate volume of blood received in culture bottles Performed at [facility], [address], [place], [place] [address]    Culture  Setup Time   Final    GRAM POSITIVE COCCI AEROBIC  BOTTLE ONLY CRITICAL VALUE NOTED.  VALUE IS CONSISTENT WITH PREVIOUSLY REPORTED AND CALLED VALUE.    Culture (A)  Final    STAPHYLOCOCCUS HOMINIS SUSCEPTIBILITIES PERFORMED ON PREVIOUS CULTURE WITHIN THE LAST 5 DAYS. Performed at [facility], [address], [place], [place] [address]    Report Status 12/27/2020 FINAL  Final  Blood Culture (routine x 2)     Status: Abnormal   Collection Time: 12/23/20  7:11 PM   Specimen: BLOOD  Result Value Ref Range Status   Specimen Description   Final    BLOOD RIGHT ANTECUBITAL Performed at [facility], [address] [place], [place] [address]    Special Requests   Final    BOTTLES DRAWN AEROBIC AND ANAEROBIC Blood Culture results may not be optimal due to an inadequate volume of blood received in culture bottles Performed at [facility], [address] [place], [place] [address]    Culture  Setup Time   Final    GRAM POSITIVE COCCI IN CHAINS IN CLUSTERS IN BOTH AEROBIC AND  ANAEROBIC BOTTLES CRITICAL RESULT CALLED TO, READ BACK BY AND VERIFIED WITH: PHARMD ANH PHAM 12/24/20 @2035  BY JW Performed at [facility], [address], [place], [place] [address]    Culture (A)  Final    STREPTOCOCCUS MITIS/ORALIS STAPHYLOCOCCUS HOMINIS    Report Status 12/27/2020 FINAL  Final   Organism ID, Bacteria STREPTOCOCCUS MITIS/ORALIS  Final   Organism ID, Bacteria STAPHYLOCOCCUS HOMINIS  Final      Susceptibility   Staphylococcus hominis - MIC*    CIPROFLOXACIN >=8 RESISTANT Resistant     ERYTHROMYCIN >=8 RESISTANT Resistant     GENTAMICIN <=0.5 SENSITIVE Sensitive     OXACILLIN >=4 RESISTANT Resistant     TETRACYCLINE <=1 SENSITIVE Sensitive     VANCOMYCIN <=0.5 SENSITIVE Sensitive     TRIMETH/SULFA 80 RESISTANT Resistant     CLINDAMYCIN RESISTANT Resistant     RIFAMPIN <=0.5 SENSITIVE Sensitive     Inducible Clindamycin POSITIVE Resistant     * STAPHYLOCOCCUS HOMINIS   Streptococcus  mitis/oralis - MIC*    TETRACYCLINE 0.5 SENSITIVE Sensitive     VANCOMYCIN 0.5 SENSITIVE Sensitive     CLINDAMYCIN <=0.25 SENSITIVE Sensitive     PENICILLIN Value in next row Sensitive      SENSITIVE0.06    CEFTRIAXONE Value in next row Sensitive      SENSITIVE0.12    * STREPTOCOCCUS MITIS/ORALIS  Blood Culture ID Panel (Reflexed)     Status: Abnormal   Collection Time: 12/23/20  7:11 PM  Result Value Ref Range Status   Enterococcus faecalis NOT DETECTED NOT DETECTED Final   Enterococcus Faecium NOT DETECTED NOT DETECTED Final   Listeria monocytogenes NOT DETECTED NOT DETECTED Final   Staphylococcus species DETECTED (A) NOT DETECTED Final    Comment: CRITICAL RESULT CALLED TO, READ BACK BY AND VERIFIED WITH: PHARMD ANH PHAM 12/24/20 @2035  BY JW    Staphylococcus aureus (BCID) NOT DETECTED NOT DETECTED Final   Staphylococcus epidermidis NOT DETECTED NOT DETECTED Final   Staphylococcus lugdunensis NOT DETECTED NOT DETECTED Final   Streptococcus species DETECTED (A) NOT DETECTED Final    Comment: Not Enterococcus species, Streptococcus agalactiae, Streptococcus pyogenes, or Streptococcus pneumoniae. CRITICAL RESULT CALLED TO, READ BACK BY AND VERIFIED WITH: PHARMD ANH PHAM 12/24/20 @2035  BY JW    Streptococcus agalactiae NOT DETECTED NOT DETECTED Final   Streptococcus pneumoniae NOT DETECTED NOT DETECTED Final   Streptococcus pyogenes NOT DETECTED NOT DETECTED Final   A.calcoaceticus-baumannii NOT DETECTED NOT DETECTED Final   Bacteroides fragilis NOT DETECTED NOT DETECTED Final   Enterobacterales NOT DETECTED NOT DETECTED Final   Enterobacter cloacae complex NOT DETECTED NOT DETECTED Final   Escherichia coli NOT DETECTED NOT DETECTED Final   Klebsiella aerogenes NOT DETECTED NOT DETECTED Final   Klebsiella oxytoca NOT DETECTED NOT DETECTED Final   Klebsiella pneumoniae NOT DETECTED NOT DETECTED Final   Proteus species NOT DETECTED NOT DETECTED Final   Salmonella species NOT  DETECTED NOT DETECTED Final   Serratia marcescens NOT DETECTED NOT DETECTED Final   Haemophilus influenzae NOT DETECTED NOT DETECTED Final   Neisseria meningitidis NOT DETECTED NOT DETECTED Final   Pseudomonas aeruginosa NOT DETECTED NOT DETECTED Final   Stenotrophomonas maltophilia NOT DETECTED NOT DETECTED Final   Candida albicans NOT DETECTED NOT DETECTED Final   Candida auris NOT DETECTED NOT DETECTED Final   Candida glabrata NOT DETECTED NOT DETECTED Final   Candida krusei NOT DETECTED NOT DETECTED Final   Candida parapsilosis NOT DETECTED NOT DETECTED Final   Candida  tropicalis NOT DETECTED NOT DETECTED Final   Cryptococcus neoformans/gattii NOT DETECTED NOT DETECTED Final    Comment: Performed at [facility], [address] [place], [place] [address]  Aerobic Culture w Gram Stain (superficial specimen)     Status: None   Collection Time: 12/23/20  9:34 PM   Specimen: RECTAL SWAB  Result Value Ref Range Status   Specimen Description   Final    RECTAL SWAB Performed at [facility], [address] [place], [place] [address]    Special Requests   Final    NONE Performed at [facility], [address] [place], [place] [address]    Gram Stain   Final    RARE WBC PRESENT,BOTH PMN AND MONONUCLEAR FEW GRAM POSITIVE COCCI IN PAIRS RARE GRAM POSITIVE RODS FEW GRAM NEGATIVE RODS    Culture   Final    FEW MULTIPLE ORGANISMS PRESENT, NONE PREDOMINANT NO GROUP A STREP (S.PYOGENES) ISOLATED NO STAPHYLOCOCCUS AUREUS ISOLATED Performed at [facility], [address] [place], [place] [address]    Report Status 12/26/2020 FINAL  Final  Culture, blood (routine x 2)     Status: None (Preliminary result)   Collection Time: 12/26/20  9:34 AM   Specimen: BLOOD  Result Value Ref Range Status   Specimen Description   Final    BLOOD RIGHT ANTECUBITAL Performed at [facility], [address], [place], [place]  [address]    Special Requests   Final    BOTTLES DRAWN AEROBIC ONLY Blood Culture adequate volume Performed at [facility], [address], [place], [place] [address]    Culture   Final    NO GROWTH 4 DAYS Performed at [facility], [address], [place], [place] [address]    Report Status PENDING  Incomplete  Culture, blood (routine x 2)     Status: None (Preliminary result)   Collection Time: 12/26/20  9:35 AM   Specimen: BLOOD LEFT HAND  Result Value Ref Range Status   Specimen Description   Final    BLOOD LEFT HAND Performed at [facility], [address] [place], [place] [address]    Special Requests   Final    BOTTLES DRAWN AEROBIC ONLY Blood Culture adequate volume Performed at [facility], [address] [place], [place] [address]    Culture   Final    NO GROWTH 4 DAYS Performed at [facility], [address], [place], [place] [address]    Report Status PENDING  Incomplete     Labs: BNP (last 3 results) Recent Labs    03/27/20 0149 03/28/20 0241 03/29/20 0210  BNP 174.8* 124.5* 110.3*   Basic Metabolic Panel: Recent Labs  Lab 12/24/20 0610 12/25/20 0517 12/26/20 0523 12/27/20 0519 12/28/20 0550  NA 139 136 141 139 142  K 2.9* 3.6 3.0* 3.3* 3.9  CL 109 106 110 107 107  CO2 22 23 24 24 26   GLUCOSE 134* 137* 58* 107* 85  BUN 16 9 9  7* 6*  CREATININE 0.92 0.66 0.62 0.62 0.75  CALCIUM 7.9* 8.4* 8.4* 9.0 9.4  MG  --  1.4*  --  1.5* 1.9   Liver Function Tests: Recent Labs  Lab 12/23/20 1906  AST 20  ALT 15  ALKPHOS 73  BILITOT 2.9*  PROT 6.7  ALBUMIN 3.5   No results for input(s): LIPASE, AMYLASE in the last 168 hours.  No results for input(s): AMMONIA in the last 168 hours. CBC: Recent Labs  Lab 12/23/20 1906 12/24/20 0610 12/25/20 0517 12/26/20 0523 12/27/20 0519 12/28/20 0550  WBC 10.3 13.6* 12.2* 10.0 7.2 8.7  NEUTROABS 9.3*  --   --   --   --   --   HGB 13.4  11.9* 11.6* 11.3* 11.7* 12.3  HCT 42.5 36.9 35.5* 35.6* 35.9* 38.8  MCV 82.0 80.7 80.3 81.7 79.4* 80.0  PLT 138* 129* 115* 131* 148* 221   Cardiac Enzymes: No results for input(s): CKTOTAL, CKMB, CKMBINDEX, TROPONINI in the last 168 hours. BNP: Invalid input(s): POCBNP CBG: Recent Labs  Lab 12/29/20 0736 12/29/20 1146 12/29/20 1543 12/29/20 1959 12/30/20 0752  GLUCAP 112* 209* 161* 393* 224*   D-Dimer No results for input(s): DDIMER in the last 72 hours. Hgb A1c No results for input(s): HGBA1C in the last 72 hours. Lipid Profile No results for input(s): CHOL, HDL, LDLCALC, TRIG, CHOLHDL, LDLDIRECT in the last 72 hours. Thyroid function studies No results for input(s): TSH, T4TOTAL, T3FREE, THYROIDAB in the last 72 hours.  Invalid input(s): FREET3 Anemia work up No results for input(s): VITAMINB12, FOLATE, FERRITIN, TIBC, IRON, RETICCTPCT in the last 72 hours. Urinalysis    Component Value Date/Time   COLORURINE YELLOW 12/23/2020 2203   APPEARANCEUR CLOUDY (A) 12/23/2020 2203   LABSPEC 1.015 12/23/2020 2203   PHURINE 5.0 12/23/2020 2203   GLUCOSEU >=500 (A) 12/23/2020 2203   HGBUR NEGATIVE 12/23/2020 2203   BILIRUBINUR NEGATIVE 12/23/2020 2203   KETONESUR 5 (A) 12/23/2020 2203   PROTEINUR NEGATIVE 12/23/2020 2203   UROBILINOGEN 1.0 08/14/2012 1022   NITRITE NEGATIVE 12/23/2020 2203   LEUKOCYTESUR SMALL (A) 12/23/2020 2203   Sepsis Labs Invalid input(s): PROCALCITONIN,  WBC,  LACTICIDVEN Microbiology Recent Results (from the past 240 hour(s))  Urine Culture     Status: Abnormal   Collection Time: 12/23/20  2:30 AM   Specimen: Urine, Clean Catch  Result Value Ref Range Status   Specimen Description   Final    URINE, CLEAN CATCH Performed at [facility], [address], [place], [place] [address]    Special Requests   Final    NONE Performed at [facility], [address] [place], [place] [address]    Culture  >=100,000 COLONIES/mL AEROCOCCUS URINAE (A)  Final   Report Status 12/26/2020 FINAL  Final  Resp Panel by RT-PCR (Flu A&B, Covid) Nasopharyngeal Swab     Status: None   Collection Time: 12/23/20  6:59 PM   Specimen: Nasopharyngeal Swab; Nasopharyngeal(NP) swabs in vial transport medium  Result Value Ref Range Status   SARS Coronavirus 2 by RT PCR NEGATIVE NEGATIVE Final    Comment: (NOTE) SARS-CoV-2 target nucleic acids are NOT DETECTED.  The SARS-CoV-2 RNA is generally detectable in upper respiratory specimens during the acute phase of infection. The lowest concentration of SARS-CoV-2 viral copies this assay can detect is 138 copies/mL. A negative result does not preclude SARS-Cov-2 infection and should not be used as the sole basis for treatment or other patient management decisions. A negative result may occur with  improper specimen collection/handling, submission of specimen other than nasopharyngeal swab, presence of viral mutation(s) within the areas targeted by this assay, and inadequate number of viral copies(<138 copies/mL). A negative result must be combined with clinical observations, patient history, and epidemiological information. The expected result is Negative.  Fact Sheet for Patients:  BloggerCourse.com  Fact Sheet for Healthcare Providers:  SeriousBroker.it  This test is no t yet approved or cleared by the [place] and  has been authorized for detection and/or diagnosis of SARS-CoV-2 by FDA under an Emergency Use Authorization (EUA). This EUA will remain  in effect (meaning this test can be used) for the duration of the COVID-19 declaration under Section 564(b)(1) of the Act, 21 U.S.C.section 360bbb-3(b)(1), unless the authorization is terminated  or revoked sooner.       Influenza A by PCR NEGATIVE NEGATIVE Final   Influenza B by PCR NEGATIVE NEGATIVE Final    Comment: (NOTE) The Xpert Xpress  SARS-CoV-2/FLU/RSV plus assay is intended as an aid in the diagnosis of influenza from Nasopharyngeal swab specimens and should not be used as a sole basis for treatment. Nasal washings and aspirates are unacceptable for Xpert Xpress SARS-CoV-2/FLU/RSV testing.  Fact Sheet for Patients: BloggerCourse.com  Fact Sheet for Healthcare Providers: SeriousBroker.it  This test is not yet approved or cleared by the [place] FDA and has been authorized for detection and/or diagnosis of SARS-CoV-2 by FDA under an Emergency Use Authorization (EUA). This EUA will remain in effect (meaning this test can be used) for the duration of the COVID-19 declaration under Section 564(b)(1) of the Act, 21 U.S.C. section 360bbb-3(b)(1), unless the authorization is terminated or revoked.  Performed at [facility], [address] [place], [place] [address]   Blood Culture (routine x 2)     Status: Abnormal   Collection Time: 12/23/20  7:06 PM   Specimen: BLOOD  Result Value Ref Range Status   Specimen Description   Final    BLOOD RIGHT ANTECUBITAL Performed at [facility], [address], [place], [place] [address]    Special Requests   Final    BOTTLES DRAWN AEROBIC AND ANAEROBIC Blood Culture results may not be optimal due to an inadequate volume of blood received in culture bottles Performed at [facility], [address], [place], [place] [address]    Culture  Setup Time   Final    GRAM POSITIVE COCCI AEROBIC BOTTLE ONLY CRITICAL VALUE NOTED.  VALUE IS CONSISTENT WITH PREVIOUSLY REPORTED AND CALLED VALUE.    Culture (A)  Final    STAPHYLOCOCCUS HOMINIS SUSCEPTIBILITIES PERFORMED ON PREVIOUS CULTURE WITHIN THE LAST 5 DAYS. Performed at [facility], [address] [place], [place] [address]    Report Status 12/27/2020 FINAL  Final  Blood Culture (routine x 2)     Status:  Abnormal   Collection Time: 12/23/20  7:11 PM   Specimen: BLOOD  Result Value Ref Range Status   Specimen Description   Final    BLOOD RIGHT ANTECUBITAL Performed at [facility], [address], [place], [place] [address]    Special Requests   Final    BOTTLES DRAWN AEROBIC AND ANAEROBIC Blood Culture results may not be optimal due to an inadequate volume of blood received in culture bottles Performed at [facility], [address], [place], [place] [address]    Culture  Setup Time   Final    GRAM POSITIVE COCCI IN CHAINS IN CLUSTERS IN BOTH AEROBIC AND ANAEROBIC BOTTLES CRITICAL RESULT CALLED TO, READ BACK BY AND VERIFIED WITH: PHARMD ANH PHAM 12/24/20  BY JW Performed at [facility], [address], [place], [place] [address]    Culture (A)  Final    STREPTOCOCCUS MITIS/ORALIS STAPHYLOCOCCUS HOMINIS    Report Status 12/27/2020 FINAL  Final  Organism ID, Bacteria STREPTOCOCCUS MITIS/ORALIS  Final   Organism ID, Bacteria STAPHYLOCOCCUS HOMINIS  Final      Susceptibility   Staphylococcus hominis - MIC*    CIPROFLOXACIN >=8 RESISTANT Resistant     ERYTHROMYCIN >=8 RESISTANT Resistant     GENTAMICIN <=0.5 SENSITIVE Sensitive     OXACILLIN >=4 RESISTANT Resistant     TETRACYCLINE <=1 SENSITIVE Sensitive     VANCOMYCIN <=0.5 SENSITIVE Sensitive     TRIMETH/SULFA 80 RESISTANT Resistant     CLINDAMYCIN RESISTANT Resistant     RIFAMPIN <=0.5 SENSITIVE Sensitive     Inducible Clindamycin POSITIVE Resistant     * STAPHYLOCOCCUS HOMINIS   Streptococcus mitis/oralis - MIC*    TETRACYCLINE 0.5 SENSITIVE Sensitive     VANCOMYCIN 0.5 SENSITIVE Sensitive     CLINDAMYCIN <=0.25 SENSITIVE Sensitive     PENICILLIN Value in next row Sensitive      SENSITIVE0.06    CEFTRIAXONE Value in next row Sensitive      SENSITIVE0.12    * STREPTOCOCCUS MITIS/ORALIS  Blood Culture ID Panel (Reflexed)     Status: Abnormal   Collection Time: 12/23/20   7:11 PM  Result Value Ref Range Status   Enterococcus faecalis NOT DETECTED NOT DETECTED Final   Enterococcus Faecium NOT DETECTED NOT DETECTED Final   Listeria monocytogenes NOT DETECTED NOT DETECTED Final   Staphylococcus species DETECTED (A) NOT DETECTED Final    Comment: CRITICAL RESULT CALLED TO, READ BACK BY AND VERIFIED WITH: PHARMD ANH PHAM 12/24/20 @2035  BY JW    Staphylococcus aureus (BCID) NOT DETECTED NOT DETECTED Final   Staphylococcus epidermidis NOT DETECTED NOT DETECTED Final   Staphylococcus lugdunensis NOT DETECTED NOT DETECTED Final   Streptococcus species DETECTED (A) NOT DETECTED Final    Comment: Not Enterococcus species, Streptococcus agalactiae, Streptococcus pyogenes, or Streptococcus pneumoniae. CRITICAL RESULT CALLED TO, READ BACK BY AND VERIFIED WITH: PHARMD ANH PHAM 12/24/20 @2035  BY JW    Streptococcus agalactiae NOT DETECTED NOT DETECTED Final   Streptococcus pneumoniae NOT DETECTED NOT DETECTED Final   Streptococcus pyogenes NOT DETECTED NOT DETECTED Final   A.calcoaceticus-baumannii NOT DETECTED NOT DETECTED Final   Bacteroides fragilis NOT DETECTED NOT DETECTED Final   Enterobacterales NOT DETECTED NOT DETECTED Final   Enterobacter cloacae complex NOT DETECTED NOT DETECTED Final   Escherichia coli NOT DETECTED NOT DETECTED Final   Klebsiella aerogenes NOT DETECTED NOT DETECTED Final   Klebsiella oxytoca NOT DETECTED NOT DETECTED Final   Klebsiella pneumoniae NOT DETECTED NOT DETECTED Final   Proteus species NOT DETECTED NOT DETECTED Final   Salmonella species NOT DETECTED NOT DETECTED Final   Serratia marcescens NOT DETECTED NOT DETECTED Final   Haemophilus influenzae NOT DETECTED NOT DETECTED Final   Neisseria meningitidis NOT DETECTED NOT DETECTED Final   Pseudomonas aeruginosa NOT DETECTED NOT DETECTED Final   Stenotrophomonas maltophilia NOT DETECTED NOT DETECTED Final   Candida albicans NOT DETECTED NOT DETECTED Final   Candida auris NOT  DETECTED NOT DETECTED Final   Candida glabrata NOT DETECTED NOT DETECTED Final   Candida krusei NOT DETECTED NOT DETECTED Final   Candida parapsilosis NOT DETECTED NOT DETECTED Final   Candida tropicalis NOT DETECTED NOT DETECTED Final   Cryptococcus neoformans/gattii NOT DETECTED NOT DETECTED Final    Comment: Performed at [facility], [address], [place], [place] [address]  Aerobic Culture w Gram Stain (superficial specimen)     Status: None   Collection Time: 12/23/20  9:34 PM   Specimen:  RECTAL SWAB  Result Value Ref Range Status   Specimen Description   Final    RECTAL SWAB Performed at [facility], [address] [place], [place] [address]    Special Requests   Final    NONE Performed at [facility], [address] [place], [place] [address]    Gram Stain   Final    RARE WBC PRESENT,BOTH PMN AND MONONUCLEAR FEW GRAM POSITIVE COCCI IN PAIRS RARE GRAM POSITIVE RODS FEW GRAM NEGATIVE RODS    Culture   Final    FEW MULTIPLE ORGANISMS PRESENT, NONE PREDOMINANT NO GROUP A STREP (S.PYOGENES) ISOLATED NO STAPHYLOCOCCUS AUREUS ISOLATED Performed at [facility], [address], [place], [place] [address]    Report Status 12/26/2020 FINAL  Final  Culture, blood (routine x 2)     Status: None (Preliminary result)   Collection Time: 12/26/20  9:34 AM   Specimen: BLOOD  Result Value Ref Range Status   Specimen Description   Final    BLOOD RIGHT ANTECUBITAL Performed at [facility], [address], [place], [place] [address]    Special Requests   Final    BOTTLES DRAWN AEROBIC ONLY Blood Culture adequate volume Performed at [facility], [address], [place], [place] [address]    Culture   Final    NO GROWTH 4 DAYS Performed at [facility], [address] [place], [place] [address]    Report Status PENDING  Incomplete  Culture, blood (routine x 2)     Status: None  (Preliminary result)   Collection Time: 12/26/20  9:35 AM   Specimen: BLOOD LEFT HAND  Result Value Ref Range Status   Specimen Description   Final    BLOOD LEFT HAND Performed at [facility], [address], [place], [place] [address]    Special Requests   Final    BOTTLES DRAWN AEROBIC ONLY Blood Culture adequate volume Performed at [facility], [address] [place], [place] [address]    Culture   Final    NO GROWTH 4 DAYS Performed at [facility], [address], [place], [place] [address]    Report Status PENDING  Incomplete     Patient was seen and examined on the day of discharge and was found to be in stable condition. Time coordinating discharge: 25 minutes including assessment and coordination of care, as well as examination of the patient.   SIGNED:  Noralee Stain, DO [facility] 12/30/2020, 9:44 AM

## 2020-12-30 NOTE — [place] FL2 (Signed)
[place] MEDICAID FL2 LEVEL OF CARE SCREENING TOOL     IDENTIFICATION  Patient Name: Emma Maldonado Birthdate: 11/24/1950 Sex: female Admission Date (Current Location): 12/23/2020  [facility] and [place] Number:  Producer, television/film/video and Address:  [facility],  [address] [place], [place] [address]      Provider Number: [id number]  Attending Physician Name and Address:  Noralee Stain, DO  Relative Name and Phone Number:  Wernli,Paul (Spouse)   [phone]    Current Level of Care: Hospital Recommended Level of Care: Assisted Living Facility, Memory Care Prior Approval Number:    Date Approved/Denied:   PASRR Number:    Discharge Plan: Domiciliary (Rest home) [facility])    Current Diagnoses: Patient Active Problem List   Diagnosis Date Noted   Bacteremia    Severe sepsis (HCC) 12/23/2020   Hypothyroidism 12/23/2020   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   Hypomagnesemia    Acute renal failure (HCC)    Hypotension 08/22/2020   Hydronephrosis of right kidney 05/01/2020   Rib fractures 05/01/2020   UTI (urinary tract infection) 03/25/2020   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   Hypokalemia 03/25/2020   Essential hypertension 03/25/2020   Dementia (HCC) 03/25/2020   Altered mental status 03/25/2020   Uncontrolled type 2 diabetes mellitus with hyperglycemia (HCC) 03/25/2020   Accelerated hypertension 03/28/2012   Viral gastroenteritis 03/28/2012   Sepsis (HCC) 03/26/2012   History of renal transplant 03/26/2012   Diabetes mellitus (HCC) 03/26/2012   Hyperlipidemia 03/26/2012    Orientation RESPIRATION BLADDER Height & Weight     Self  Normal Incontinent (intermittent) Weight: 66 kg Height:  4' 11.84" (152 cm)  BEHAVIORAL SYMPTOMS/MOOD NEUROLOGICAL BOWEL NUTRITION STATUS      Incontinent (intermittent) Diet (Carb Modified)  AMBULATORY STATUS COMMUNICATION OF NEEDS Skin   Supervision Verbally Normal                        Personal Care Assistance Level of Assistance  Bathing, Feeding, Dressing Bathing Assistance: Limited assistance Feeding assistance: Limited assistance Dressing Assistance: Limited assistance     Functional Limitations Info  Sight, Hearing, Speech Sight Info: Adequate Hearing Info: Adequate Speech Info: Adequate    SPECIAL CARE FACTORS FREQUENCY                       Contractures Contractures Info: Not present    Additional Factors Info  Code Status, Allergies Code Status Info: full Allergies Info: NKA           Discharge Medications: acetaminophen 500 MG tablet Tambin conocido como: TYLENOL Take 500 mg by mouth every 6 (six) hours as needed for fever or headache.           alum & mag hydroxide-simeth 200-200-20 MG/5ML suspension Tambin conocido como: MAALOX/MYLANTA Take 30 mLs by mouth every 6 (six) hours as needed for indigestion or heartburn.          fludrocortisone 0.1 MG tablet Tambin conocido como: FLORINEF Take 0.1 mg by mouth at bedtime.          guaifenesin 100 MG/5ML syrup Tambin conocido como: ROBITUSSIN Take 200 mg by mouth every 6 (six) hours as needed for cough.         Icono de los medicamentos que debe empezar a tomar   hydrocortisone 100 MG/60ML enema Tambin conocido como: CORTENEMA Place 1 enema (100 mg total) rectally at bedtime for 14 days.  insulin aspart 100 UNIT/ML injection Tambin conocido como: novoLOG Inject 20 Units into the skin See admin instructions. Inject 10 units into the skin three times a day after meals and hold if not eating          Lantus SoloStar 100 UNIT/ML Solostar Pen Inject 20 Units into the skin in the morning. Medicamento genrico: insulin glargine          levothyroxine 100 MCG tablet Tambin conocido como: SYNTHROID Take 100 mcg by mouth daily before breakfast.          loperamide 2 MG capsule Tambin conocido como: IMODIUM Take 2 mg by mouth as needed (with each loose stool for diarrhea-  cannot exceed 8 doses a day).          magnesium hydroxide 400 MG/5ML suspension Tambin conocido como: MILK OF MAGNESIA Take 30 mLs by mouth at bedtime as needed for mild constipation.          memantine 10 MG tablet Tambin conocido como: NAMENDA Take 10 mg by mouth every 12 (twelve) hours.          multivitamin with minerals Tabs tablet Tome 1 tableta por va oral diariamente.          neomycin-bacitracin-polymyxin [phone] ointment Apply 1 application topically as needed (for minor skin tears or abrasions, after cleaning with normal saline- bandage with gauze and tape).          predniSONE 5 MG tablet Tambin conocido como: DELTASONE Take 5 mg by mouth every morning.          QUEtiapine 25 MG tablet Tambin conocido como: SEROQUEL Take 25 mg by mouth at bedtime.          rosuvastatin 5 MG tablet Tambin conocido como: CRESTOR Take 5 mg by mouth at bedtime.          tacrolimus 0.5 MG capsule Tambin conocido como: PROGRAF Take 1 capsule (0.5 mg total) by mouth daily.          Vitamin D3 50 MCG (2000 UT) Tabs Take 2,000 Units by mouth daily.            Relevant Imaging Results:  Relevant Lab Results:   Additional Information SSN: [id number]  Baldo Daub [place], [place]

## 2020-12-31 LAB — CULTURE, BLOOD (ROUTINE X 2)
Culture: NO GROWTH
Culture: NO GROWTH
Special Requests: ADEQUATE
Special Requests: ADEQUATE

## 2020-12-31 LAB — SURGICAL PATHOLOGY

## 2021-01-01 ENCOUNTER — Inpatient Hospital Stay (HOSPITAL_COMMUNITY)
Admission: EM | Admit: 2021-01-01 | Discharge: 2021-01-14 | DRG: 871 | Disposition: A | Discharge status: Skilled Nursing Facility | Payer: Medicare Other | Source: Skilled Nursing Facility | Attending: Internal Medicine | Admitting: Internal Medicine

## 2021-01-01 ENCOUNTER — Emergency Department (HOSPITAL_COMMUNITY): Payer: Medicare Other

## 2021-01-01 ENCOUNTER — Encounter (HOSPITAL_COMMUNITY): Payer: Self-pay

## 2021-01-01 DIAGNOSIS — Z90710 Acquired absence of both cervix and uterus: Secondary | ICD-10-CM

## 2021-01-01 DIAGNOSIS — I700 Atherosclerosis of aorta: Secondary | ICD-10-CM | POA: Diagnosis present

## 2021-01-01 DIAGNOSIS — K529 Noninfective gastroenteritis and colitis, unspecified: Secondary | ICD-10-CM

## 2021-01-01 DIAGNOSIS — I10 Essential (primary) hypertension: Secondary | ICD-10-CM

## 2021-01-01 DIAGNOSIS — F0390 Unspecified dementia without behavioral disturbance: Secondary | ICD-10-CM | POA: Diagnosis present

## 2021-01-01 DIAGNOSIS — Z794 Long term (current) use of insulin: Secondary | ICD-10-CM

## 2021-01-01 DIAGNOSIS — A419 Sepsis, unspecified organism: Secondary | ICD-10-CM | POA: Diagnosis not present

## 2021-01-01 DIAGNOSIS — Z7960 Long term (current) use of unspecified immunomodulators and immunosuppressants: Secondary | ICD-10-CM

## 2021-01-01 DIAGNOSIS — F03918 Unspecified dementia, unspecified severity, with other behavioral disturbance: Secondary | ICD-10-CM

## 2021-01-01 DIAGNOSIS — G9341 Metabolic encephalopathy: Secondary | ICD-10-CM | POA: Diagnosis present

## 2021-01-01 DIAGNOSIS — N390 Urinary tract infection, site not specified: Secondary | ICD-10-CM

## 2021-01-01 DIAGNOSIS — E039 Hypothyroidism, unspecified: Secondary | ICD-10-CM | POA: Diagnosis present

## 2021-01-01 DIAGNOSIS — Z7952 Long term (current) use of systemic steroids: Secondary | ICD-10-CM

## 2021-01-01 DIAGNOSIS — Z79890 Hormone replacement therapy (postmenopausal): Secondary | ICD-10-CM

## 2021-01-01 DIAGNOSIS — I951 Orthostatic hypotension: Secondary | ICD-10-CM | POA: Diagnosis present

## 2021-01-01 DIAGNOSIS — T68XXXA Hypothermia, initial encounter: Secondary | ICD-10-CM

## 2021-01-01 DIAGNOSIS — Z20822 Contact with and (suspected) exposure to covid-19: Secondary | ICD-10-CM | POA: Diagnosis present

## 2021-01-01 DIAGNOSIS — K6289 Other specified diseases of anus and rectum: Secondary | ICD-10-CM | POA: Diagnosis present

## 2021-01-01 DIAGNOSIS — L8990 Pressure ulcer of unspecified site, unspecified stage: Secondary | ICD-10-CM | POA: Insufficient documentation

## 2021-01-01 DIAGNOSIS — E785 Hyperlipidemia, unspecified: Secondary | ICD-10-CM | POA: Diagnosis present

## 2021-01-01 DIAGNOSIS — B952 Enterococcus as the cause of diseases classified elsewhere: Secondary | ICD-10-CM | POA: Diagnosis present

## 2021-01-01 DIAGNOSIS — Z940 Kidney transplant status: Secondary | ICD-10-CM

## 2021-01-01 DIAGNOSIS — E11649 Type 2 diabetes mellitus with hypoglycemia without coma: Secondary | ICD-10-CM | POA: Diagnosis present

## 2021-01-01 DIAGNOSIS — R338 Other retention of urine: Secondary | ICD-10-CM

## 2021-01-01 DIAGNOSIS — R680 Hypothermia, not associated with low environmental temperature: Secondary | ICD-10-CM | POA: Diagnosis present

## 2021-01-01 DIAGNOSIS — Z79899 Other long term (current) drug therapy: Secondary | ICD-10-CM

## 2021-01-01 DIAGNOSIS — N39490 Overflow incontinence: Secondary | ICD-10-CM | POA: Diagnosis present

## 2021-01-01 DIAGNOSIS — Z9049 Acquired absence of other specified parts of digestive tract: Secondary | ICD-10-CM

## 2021-01-01 DIAGNOSIS — L89152 Pressure ulcer of sacral region, stage 2: Secondary | ICD-10-CM | POA: Diagnosis present

## 2021-01-01 DIAGNOSIS — J189 Pneumonia, unspecified organism: Secondary | ICD-10-CM

## 2021-01-01 DIAGNOSIS — D84821 Immunodeficiency due to drugs: Secondary | ICD-10-CM | POA: Diagnosis present

## 2021-01-01 NOTE — ED Triage Notes (Signed)
Pt to ED via [facility] from [facility] with c/o hypotension.  Call was placed earlier for hypoglycemia, CBG was 33, D10 given and call cleared.

## 2021-01-01 NOTE — ED Provider Notes (Signed)
[facility]-EMERGENCY DEPT Provider Note   CSN: [id number] Arrival date & time: 01/01/21  2304     History Chief Complaint  Patient presents with   Hypotension    Emma Maldonado is a 70 y.o. female.  Patient is a 70 year old female who presents with hypotension and altered mental status.  She has a history of dementia, diabetes, hypertension, she is status post prior renal transplant on chronic immunosuppressive's.  She was recently admitted for proctitis and sepsis.  She lives at the [facility] memory care unit.  Her blood sugars have been up and down recently over the last couple days.  Earlier today it was noted to be 33.  She was treated by EMS and remained in the facility.  Today they were called back again due to altered mental status.  Her daughter who is power of attorney is at bedside and said that she has been more confused than her baseline today.  She was noted to be hypotensive by EMS with initial blood pressure of 68 systolic.  This responded to IV fluids.  She was given 600 cc by EMS of normal saline.  Patient is denying any complaints of pain.  She has not had any noted recent vomiting or diarrhea.      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   Hypokalemia 03/25/2020   Essential hypertension 03/25/2020   Dementia (HCC) 03/25/2020   Altered mental status 03/25/2020   Uncontrolled type 2  diabetes mellitus with hyperglycemia (HCC) 03/25/2020   Accelerated hypertension 03/28/2012   Viral gastroenteritis 03/28/2012   Sepsis (HCC) 03/26/2012   History of renal transplant 03/26/2012   Diabetes mellitus (HCC) 03/26/2012   Hyperlipidemia 03/26/2012    Past Surgical History:  Procedure Laterality Date   ABDOMINAL HYSTERECTOMY     BIOPSY  12/28/2020   Procedure: BIOPSY;  Surgeon: Kathi Der, MD;  Location: [facility];  Service: Gastroenterology;;   CHOLECYSTECTOMY     FLEXIBLE SIGMOIDOSCOPY N/A 12/28/2020   Procedure: FLEXIBLE SIGMOIDOSCOPY;  Surgeon: Kathi Der, MD;  Location: [facility];  Service: Gastroenterology;  Laterality: N/A;   NEPHRECTOMY TRANSPLANTED ORGAN       OB History   No obstetric history on file.     Family History  Problem Relation Age of Onset   Diabetes Mellitus II Father    Colon cancer Father    Colon cancer Brother     Social History   Tobacco Use   Smoking status: Never   Smokeless tobacco: Never  Substance Use Topics   Alcohol use: No   Drug use: No    Home Medications Prior to Admission medications   Medication Sig Start Date End Date Taking? Authorizing Provider  acetaminophen (TYLENOL) 500 MG tablet Take 500 mg by mouth every 6 (six) hours as needed for fever or headache.   Yes [provider]  alum & Jodelle Green  hydroxide-simeth (MAALOX/MYLANTA) 200-200-20 MG/5ML suspension Take 30 mLs by mouth every 6 (six) hours as needed for indigestion or heartburn.   Yes [provider]  Cholecalciferol (VITAMIN D3) 50 MCG (2000 UT) TABS Take 2,000 Units by mouth daily.   Yes [provider]  fludrocortisone (FLORINEF) 0.1 MG tablet Take 0.1 mg by mouth at bedtime.    Yes [provider]  guaifenesin (ROBITUSSIN) 100 MG/5ML syrup Take 200 mg by mouth every 6 (six) hours as needed for cough.   Yes [provider]  hydrocortisone (CORTENEMA) 100 MG/60ML enema Place 1 enema (100 mg total)  rectally at bedtime for 14 days. 12/30/20 01/13/21 Yes Noralee Stain, DO  insulin aspart (NOVOLOG) 100 UNIT/ML injection Inject 20 Units into the skin See admin instructions. Inject 10 units into the skin three times a day after meals and hold if not eating   Yes [provider]  LANTUS SOLOSTAR 100 UNIT/ML Solostar Pen Inject 20 Units into the skin in the morning. Patient taking differently: Inject 24 Units into the skin in the morning. 08/26/20  Yes [name], MD  levothyroxine (SYNTHROID) 100 MCG tablet Take 100 mcg by mouth daily before breakfast. 03/04/20  Yes [provider]  loperamide (IMODIUM) 2 MG capsule Take 2 mg by mouth as needed (with each loose stool for diarrhea- cannot exceed 8 doses a day). 02/20/20  Yes [provider]  magnesium hydroxide (MILK OF MAGNESIA) 400 MG/5ML suspension Take 30 mLs by mouth at bedtime as needed for mild constipation.   Yes [provider]  memantine (NAMENDA) 10 MG tablet Take 10 mg by mouth every 12 (twelve) hours.   Yes [provider]  Multiple Vitamin (MULTIVITAMIN WITH MINERALS) TABS tablet Take 1 tablet by mouth daily. 08/26/20  Yes [name], MD  neomycin-bacitracin-polymyxin (NEOSPORIN) [phone] ointment Apply 1 application topically as needed (for minor skin tears or abrasions, after cleaning with normal saline- bandage with gauze and tape).   Yes [provider]  predniSONE (DELTASONE) 5 MG tablet Take 5 mg by mouth every morning.   Yes [provider]  QUEtiapine (SEROQUEL) 25 MG tablet Take 25 mg by mouth at bedtime.   Yes [provider]  rosuvastatin (CRESTOR) 5 MG tablet Take 5 mg by mouth at bedtime. 12/01/19  Yes [provider]  tacrolimus (PROGRAF) 0.5 MG capsule Take 1 capsule (0.5 mg total) by mouth daily. 03/28/12  Yes Dhungel, Theda Belfast, MD    Allergies    Patient has no known allergies.  Review of Systems   Review of Systems  Unable to perform  ROS: Dementia   Physical Exam Updated Vital Signs BP (!) 148/63    Pulse 80    Temp (!) 95 F (35 C) (Rectal)    Resp 20    Ht 5' (1.524 m)    Wt 66 kg    SpO2 94%    BMI 28.42 kg/m   Physical Exam Constitutional:      Appearance: She is well-developed. She is ill-appearing.     Comments: Hypothermic  HENT:     Head: Normocephalic and atraumatic.  Eyes:     Pupils: Pupils are equal, round, and reactive to light.  Cardiovascular:     Rate and Rhythm: Normal rate and regular rhythm.     Heart sounds: Normal heart sounds.  Pulmonary:     Effort: Pulmonary effort is normal. No respiratory distress.     Breath sounds: Normal breath sounds. No wheezing  or rales.  Chest:     Chest wall: No tenderness.  Abdominal:     General: Bowel sounds are normal.     Palpations: Abdomen is soft.     Tenderness: There is no abdominal tenderness. There is no guarding or rebound.  Genitourinary:    Comments: She has some redness around her perennial area.  No noted skin breakdown.  She has some mucousy/bloody discharge from her rectum. Musculoskeletal:        General: Normal range of motion.     Cervical back: Normal range of motion and neck supple.  Lymphadenopathy:     Cervical: No cervical adenopathy.  Skin:    General: Skin is warm and dry.     Findings: No rash.  Neurological:     General: No focal deficit present.     Mental Status: She is alert.     Comments: Oriented to person only.  She is awake and alert    ED Results / Procedures / Treatments   Labs (all labs ordered are listed, but only abnormal results are displayed) Labs Reviewed  COMPREHENSIVE METABOLIC PANEL - Abnormal; Notable for the following components:      Result Value   Glucose, Bld 116 (*)    Albumin 3.1 (*)    All other components within normal limits  CBC WITH DIFFERENTIAL/PLATELET - Abnormal; Notable for the following components:   RDW 15.9 (*)    Neutro Abs 8.3 (*)    Abs Immature Granulocytes 0.10 (*)     All other components within normal limits  RESP PANEL BY RT-PCR (FLU A&B, COVID) ARPGX2  CULTURE, BLOOD (ROUTINE X 2)  CULTURE, BLOOD (ROUTINE X 2)  URINE CULTURE  LACTIC ACID, PLASMA  PROTIME-INR  APTT  LACTIC ACID, PLASMA  URINALYSIS, ROUTINE W REFLEX MICROSCOPIC    EKG EKG Interpretation  Date/Time:  Wednesday January 01 2021 23:53:22 EST Ventricular Rate:  73 PR Interval:  123 QRS Duration: 92 QT Interval:  434 QTC Calculation: 479 R Axis:   60 Text Interpretation: Sinus rhythm Normal ECG When compared with ECG of EARLIER SAME DATE Nonspecific T wave abnormality is no longer present Confirmed by Dione Booze ([id number]) on 01/02/2021 3:57:37 AM  Radiology CT Abdomen Pelvis Wo Contrast  Result Date: 01/02/2021 CLINICAL DATA:  70 year old female with recent rectal inflammation, proctitis. Abdominal pain. Right lower quadrant transplant kidney. EXAM: CT ABDOMEN AND PELVIS WITHOUT CONTRAST TECHNIQUE: Multidetector CT imaging of the abdomen and pelvis was performed following the standard protocol without IV contrast. COMPARISON:  CT Abdomen and Pelvis 12/23/2020 and earlier. FINDINGS: Lower chest: Stable borderline to mild cardiomegaly. No pericardial or pleural effusion. But confluent bilateral lower lobe peribronchial opacity, worse on the right, persists and is new compared to August. The airspace disease has not significantly changed from December 12th. There are multiple bilateral chronic lower rib fractures. Hepatobiliary: Negative noncontrast liver with diminutive or absent gallbladder. Pancreas: Atrophied. Spleen: Negative. Adrenals/Urinary Tract: Normal adrenal glands. Bilateral native renal atrophy. Right lower quadrant transplant kidney with prominent collecting system unchanged from earlier this month. No pararenal inflammatory stranding. Dilated urinary bladder as before. Estimated bladder volume 750 mL. Transplant ureter redemonstrated along the right anterior bladder on series  2, image 77, no definite hydroureter or periureteral stranding. No transplant nephrolithiasis identified. Stomach/Bowel: Regressed but not fully resolved circumferential rectal wall thickening and perirectal inflammatory stranding since December 12th. Redundant large bowel with diverticulosis and retained stool elsewhere. Appendix remains gas-filled and normal near the midline on  series 2, image 56. Midline cecum redemonstrated. Nondilated terminal ileum. No dilated small bowel. Decompressed stomach and duodenum. No free air or free fluid. Vascular/Lymphatic: Diffusely advanced calcified atherosclerosis throughout the abdomen and pelvis. Normal caliber abdominal aorta. Vascular patency is not evaluated in the absence of IV contrast. No lymphadenopathy. Reproductive: Surgically absent uterus, diminutive or absent ovaries. Other: No pelvic free fluid. Chronic partially calcified nodular ventral abdominal wall thickening, probably the sequelae of chronic subcutaneous injections. Musculoskeletal: Chronic lower rib fractures. Chronic lower lumbar spine degeneration. Chronic right lumbar transverse process fractures. No acute or suspicious osseous lesion. IMPRESSION: 1. Unresolved bilateral lower lobe pulmonary opacity from earlier in this month, new since August and more compatible with lung infection than atelectasis. No pleural effusion. 2. Regressed but not resolved rectal inflammation since December 12th. No complicating features. No new bowel abnormality. Large bowel diverticulosis. Normal appendix. 3. Chronic distension of the urinary bladder (750 mL). Query urinary retention. Right lower quadrant transplant kidney with no obvious inflammation or obstructive uropathy. 4. No other acute or inflammatory process identified in the noncontrast abdomen or pelvis. Aortic Atherosclerosis (ICD10-I70.0). Electronically Signed   By: Odessa Fleming M.D.   On: 01/02/2021 04:36   CT Head Wo Contrast  Result Date:  01/01/2021 CLINICAL DATA:  Altered mental status. EXAM: CT HEAD WITHOUT CONTRAST TECHNIQUE: Contiguous axial images were obtained from the base of the skull through the vertex without intravenous contrast. COMPARISON:  March 24, 2020 FINDINGS: Brain: There is mild cerebral atrophy with widening of the extra-axial spaces and ventricular dilatation. There are areas of decreased attenuation within the white matter tracts of the supratentorial brain, consistent with microvascular disease changes. Vascular: No hyperdense vessel or unexpected calcification. Skull: Normal. Negative for fracture or focal lesion. Sinuses/Orbits: There is moderate severity left maxillary sinus mucosal thickening. Other: None. IMPRESSION: 1. Generalized cerebral atrophy. 2. No acute intracranial abnormality. 3. Moderate severity left maxillary sinus disease. Electronically Signed   By: Aram Candela M.D.   On: 01/01/2021 23:56   DG Chest Port 1 View  Result Date: 01/01/2021 CLINICAL DATA:  Weakness and hypotension. EXAM: PORTABLE CHEST 1 VIEW COMPARISON:  December 23, 2020 FINDINGS: The heart size and mediastinal contours are within normal limits. Both lungs are clear. A chronic appearing deformity is seen involving the distal right clavicle. IMPRESSION: No active disease. Electronically Signed   By: Aram Candela M.D.   On: 01/01/2021 23:39    Procedures Procedures   Medications Ordered in ED Medications  sodium chloride 0.9 % bolus 500 mL (has no administration in time range)  ceFEPIme (MAXIPIME) 2 g in sodium chloride 0.9 % 100 mL IVPB (has no administration in time range)  hydrocortisone sodium succinate (SOLU-CORTEF) 100 MG injection 100 mg (has no administration in time range)  vancomycin (VANCOREADY) IVPB 1500 mg/300 mL (has no administration in time range)    ED Course  I have reviewed the triage vital signs and the nursing notes.  Pertinent labs & imaging results that were available during my care of the  patient were reviewed by me and considered in my medical decision making (see chart for details).    MDM Rules/Calculators/A&P                         Patient is a 70 year old female who presents with altered mental status and generalized weakness.  She normally ambulates unassisted and has been generally weak over the last 2 days.  Her daughter said that  she has been more confused today.  Her blood sugars have been up and down and earlier she was hypoglycemic at 33 today.  She was hypotensive by EMS with a blood pressure of 68 systolic but has not been hypotensive in the ED.  She was hypothermic on arrival with a core temp of 95.  Her lactate is normal.  Her white count is normal.  She was recently treated for sepsis/proctitis.  CT scan today shows some improving proctitis and unchanged areas of pneumonia.  She was started on antibiotics.  I spoke with Dr. Imogene Burn.  He advised that the morning hospitalist will see the patient for admission.  He also recommends giving the patient Solu-Cortef due to her hypothermia.  CRITICAL CARE Performed by: Rolan Bucco Total critical care time: 60 minutes Critical care time was exclusive of separately billable procedures and treating other patients. Critical care was necessary to treat or prevent imminent or life-threatening deterioration. Critical care was time spent personally by me on the following activities: development of treatment plan with patient and/or surrogate as well as nursing, discussions with consultants, evaluation of patient's response to treatment, examination of patient, obtaining history from patient or surrogate, ordering and performing treatments and interventions, ordering and review of laboratory studies, ordering and review of radiographic studies, pulse oximetry and re-evaluation of patient's condition.     Final Clinical Impression(s) / ED Diagnoses Final diagnoses:  Hypothermia, initial encounter  HCAP (healthcare-associated  pneumonia)    Rx / DC Orders ED Discharge Orders     None        Rolan Bucco, MD 01/02/21 [phone]

## 2021-01-02 ENCOUNTER — Encounter (HOSPITAL_COMMUNITY): Payer: Self-pay

## 2021-01-02 ENCOUNTER — Emergency Department (HOSPITAL_COMMUNITY): Payer: Medicare Other

## 2021-01-02 DIAGNOSIS — Z90710 Acquired absence of both cervix and uterus: Secondary | ICD-10-CM | POA: Diagnosis not present

## 2021-01-02 DIAGNOSIS — Z20822 Contact with and (suspected) exposure to covid-19: Secondary | ICD-10-CM | POA: Diagnosis present

## 2021-01-02 DIAGNOSIS — R6510 Systemic inflammatory response syndrome (SIRS) of non-infectious origin without acute organ dysfunction: Secondary | ICD-10-CM | POA: Diagnosis not present

## 2021-01-02 DIAGNOSIS — Z7960 Long term (current) use of unspecified immunomodulators and immunosuppressants: Secondary | ICD-10-CM | POA: Diagnosis not present

## 2021-01-02 DIAGNOSIS — N390 Urinary tract infection, site not specified: Secondary | ICD-10-CM | POA: Diagnosis present

## 2021-01-02 DIAGNOSIS — F0390 Unspecified dementia without behavioral disturbance: Secondary | ICD-10-CM | POA: Diagnosis present

## 2021-01-02 DIAGNOSIS — Z79890 Hormone replacement therapy (postmenopausal): Secondary | ICD-10-CM | POA: Diagnosis not present

## 2021-01-02 DIAGNOSIS — L89152 Pressure ulcer of sacral region, stage 2: Secondary | ICD-10-CM | POA: Diagnosis present

## 2021-01-02 DIAGNOSIS — E11649 Type 2 diabetes mellitus with hypoglycemia without coma: Secondary | ICD-10-CM | POA: Diagnosis present

## 2021-01-02 DIAGNOSIS — E785 Hyperlipidemia, unspecified: Secondary | ICD-10-CM | POA: Diagnosis present

## 2021-01-02 DIAGNOSIS — E039 Hypothyroidism, unspecified: Secondary | ICD-10-CM | POA: Diagnosis present

## 2021-01-02 DIAGNOSIS — B952 Enterococcus as the cause of diseases classified elsewhere: Secondary | ICD-10-CM | POA: Diagnosis present

## 2021-01-02 DIAGNOSIS — Z794 Long term (current) use of insulin: Secondary | ICD-10-CM | POA: Diagnosis not present

## 2021-01-02 DIAGNOSIS — I10 Essential (primary) hypertension: Secondary | ICD-10-CM | POA: Diagnosis present

## 2021-01-02 DIAGNOSIS — K529 Noninfective gastroenteritis and colitis, unspecified: Secondary | ICD-10-CM | POA: Diagnosis present

## 2021-01-02 DIAGNOSIS — G9341 Metabolic encephalopathy: Secondary | ICD-10-CM | POA: Diagnosis present

## 2021-01-02 DIAGNOSIS — R680 Hypothermia, not associated with low environmental temperature: Secondary | ICD-10-CM | POA: Diagnosis present

## 2021-01-02 DIAGNOSIS — T68XXXA Hypothermia, initial encounter: Secondary | ICD-10-CM | POA: Diagnosis present

## 2021-01-02 DIAGNOSIS — K6289 Other specified diseases of anus and rectum: Secondary | ICD-10-CM | POA: Diagnosis present

## 2021-01-02 DIAGNOSIS — N39490 Overflow incontinence: Secondary | ICD-10-CM | POA: Diagnosis present

## 2021-01-02 DIAGNOSIS — I951 Orthostatic hypotension: Secondary | ICD-10-CM | POA: Diagnosis present

## 2021-01-02 DIAGNOSIS — Z9049 Acquired absence of other specified parts of digestive tract: Secondary | ICD-10-CM | POA: Diagnosis not present

## 2021-01-02 DIAGNOSIS — Z940 Kidney transplant status: Secondary | ICD-10-CM | POA: Diagnosis not present

## 2021-01-02 DIAGNOSIS — R338 Other retention of urine: Secondary | ICD-10-CM | POA: Diagnosis not present

## 2021-01-02 DIAGNOSIS — D84821 Immunodeficiency due to drugs: Secondary | ICD-10-CM | POA: Diagnosis present

## 2021-01-02 DIAGNOSIS — I700 Atherosclerosis of aorta: Secondary | ICD-10-CM | POA: Diagnosis present

## 2021-01-02 DIAGNOSIS — A419 Sepsis, unspecified organism: Secondary | ICD-10-CM | POA: Diagnosis present

## 2021-01-02 LAB — COMPREHENSIVE METABOLIC PANEL
ALT: 19 U/L (ref 0–44)
AST: 27 U/L (ref 15–41)
Albumin: 3.1 g/dL — ABNORMAL LOW (ref 3.5–5.0)
Alkaline Phosphatase: 65 U/L (ref 38–126)
Anion gap: 6 (ref 5–15)
BUN: 14 mg/dL (ref 8–23)
CO2: 27 mmol/L (ref 22–32)
Calcium: 8.9 mg/dL (ref 8.9–10.3)
Chloride: 108 mmol/L (ref 98–111)
Creatinine, Ser: 0.64 mg/dL (ref 0.44–1.00)
GFR, Estimated: >60 mL/min (ref >60)
Glucose, Bld: 116 mg/dL — ABNORMAL HIGH (ref 70–99)
Potassium: 3.6 mmol/L (ref 3.5–5.1)
Sodium: 141 mmol/L (ref 135–145)
Total Bilirubin: 0.8 mg/dL (ref 0.3–1.2)
Total Protein: 6.9 g/dL (ref 6.5–8.1)

## 2021-01-02 LAB — CBC WITH DIFFERENTIAL/PLATELET
Abs Immature Granulocytes: 0.1 10*3/uL — ABNORMAL HIGH (ref 0.00–0.07)
Basophils Absolute: 0.1 10*3/uL (ref 0.0–0.1)
Basophils Relative: 1 %
Eosinophils Absolute: 0 10*3/uL (ref 0.0–0.5)
Eosinophils Relative: 0 %
HCT: 40.3 % (ref 36.0–46.0)
Hemoglobin: 12.7 g/dL (ref 12.0–15.0)
Immature Granulocytes: 1 %
Lymphocytes Relative: 13 %
Lymphs Abs: 1.3 10*3/uL (ref 0.7–4.0)
MCH: 26 pg (ref 26.0–34.0)
MCHC: 31.5 g/dL (ref 30.0–36.0)
MCV: 82.4 fL (ref 80.0–100.0)
Monocytes Absolute: 0.6 10*3/uL (ref 0.1–1.0)
Monocytes Relative: 5 %
Neutro Abs: 8.3 10*3/uL — ABNORMAL HIGH (ref 1.7–7.7)
Neutrophils Relative %: 80 %
Platelets: 295 10*3/uL (ref 150–400)
RBC: 4.89 MIL/uL (ref 3.87–5.11)
RDW: 15.9 % — ABNORMAL HIGH (ref 11.5–15.5)
WBC: 10.4 10*3/uL (ref 4.0–10.5)
nRBC: 0 % (ref 0.0–0.2)

## 2021-01-02 LAB — CBC
HCT: 30.9 % — ABNORMAL LOW (ref 36.0–46.0)
Hemoglobin: 9.6 g/dL — ABNORMAL LOW (ref 12.0–15.0)
MCH: 25.9 pg — ABNORMAL LOW (ref 26.0–34.0)
MCHC: 31.1 g/dL (ref 30.0–36.0)
MCV: 83.5 fL (ref 80.0–100.0)
Platelets: 259 10*3/uL (ref 150–400)
RBC: 3.7 MIL/uL — ABNORMAL LOW (ref 3.87–5.11)
RDW: 15.9 % — ABNORMAL HIGH (ref 11.5–15.5)
WBC: 9.4 10*3/uL (ref 4.0–10.5)
nRBC: 0 % (ref 0.0–0.2)

## 2021-01-02 LAB — GLUCOSE, CAPILLARY
Glucose-Capillary: 269 mg/dL — ABNORMAL HIGH (ref 70–99)
Glucose-Capillary: 242 mg/dL — ABNORMAL HIGH (ref 70–99)

## 2021-01-02 LAB — PROTIME-INR
INR: 1 (ref 0.8–1.2)
Prothrombin Time: 13.5 seconds (ref 11.4–15.2)

## 2021-01-02 LAB — URINALYSIS, ROUTINE W REFLEX MICROSCOPIC
Bilirubin Urine: NEGATIVE
Glucose, UA: NEGATIVE mg/dL
Hgb urine dipstick: NEGATIVE
Ketones, ur: NEGATIVE mg/dL
Nitrite: NEGATIVE
Protein, ur: NEGATIVE mg/dL
Specific Gravity, Urine: 1.01 (ref 1.005–1.030)
pH: 7 (ref 5.0–8.0)

## 2021-01-02 LAB — CBG MONITORING, ED: Glucose-Capillary: 226 mg/dL — ABNORMAL HIGH (ref 70–99)

## 2021-01-02 LAB — CREATININE, SERUM
Creatinine, Ser: 0.67 mg/dL (ref 0.44–1.00)
GFR, Estimated: >60 mL/min (ref >60)

## 2021-01-02 LAB — APTT: aPTT: 30 seconds (ref 24–36)

## 2021-01-02 LAB — MRSA NEXT GEN BY PCR, NASAL: MRSA by PCR Next Gen: NOT DETECTED

## 2021-01-02 LAB — LACTIC ACID, PLASMA
Lactic Acid, Venous: 1.1 mmol/L (ref 0.5–1.9)
Lactic Acid, Venous: 1.5 mmol/L (ref 0.5–1.9)

## 2021-01-02 LAB — RESP PANEL BY RT-PCR (FLU A&B, COVID) ARPGX2
Influenza A by PCR: NEGATIVE
Influenza B by PCR: NEGATIVE
SARS Coronavirus 2 by RT PCR: NEGATIVE

## 2021-01-02 MED ORDER — HYDROCORTISONE SOD SUC (PF) 100 MG IJ SOLR
100.0000 mg | Freq: Three times a day (TID) | INTRAMUSCULAR | Status: DC
  Administered 2021-01-02 – 2021-01-03 (×4): 100 mg via INTRAVENOUS
  Filled 2021-01-02 (×4): qty 2

## 2021-01-02 MED ORDER — ENOXAPARIN SODIUM 40 MG/0.4ML IJ SOSY
40.0000 mg | PREFILLED_SYRINGE | INTRAMUSCULAR | Status: DC
  Administered 2021-01-02 – 2021-01-14 (×13): 40 mg via SUBCUTANEOUS
  Filled 2021-01-02 (×13): qty 0.4

## 2021-01-02 MED ORDER — INSULIN ASPART 100 UNIT/ML IJ SOLN
0.0000 [IU] | Freq: Three times a day (TID) | INTRAMUSCULAR | Status: DC
  Administered 2021-01-02: 18:00:00 3 [IU] via SUBCUTANEOUS
  Administered 2021-01-03: 12:00:00 4 [IU] via SUBCUTANEOUS
  Administered 2021-01-03: 08:00:00 2 [IU] via SUBCUTANEOUS
  Filled 2021-01-02: qty 0.06

## 2021-01-02 MED ORDER — ONDANSETRON HCL 4 MG/2ML IJ SOLN: 4.0000 mg | Freq: Four times a day (QID) | INTRAMUSCULAR | Status: DC | PRN

## 2021-01-02 MED ORDER — SODIUM CHLORIDE 0.9 % IV SOLN
2.0000 g | Freq: Once | INTRAVENOUS | Status: AC
  Administered 2021-01-02: 07:00:00 2 g via INTRAVENOUS
  Filled 2021-01-02: qty 2

## 2021-01-02 MED ORDER — VANCOMYCIN HCL 1500 MG/300ML IV SOLN
1500.0000 mg | Freq: Once | INTRAVENOUS | Status: AC
  Administered 2021-01-02: 08:00:00 1500 mg via INTRAVENOUS
  Filled 2021-01-02: qty 300

## 2021-01-02 MED ORDER — METRONIDAZOLE 500 MG/100ML IV SOLN
500.0000 mg | Freq: Two times a day (BID) | INTRAVENOUS | Status: DC
  Administered 2021-01-02 – 2021-01-03 (×3): 500 mg via INTRAVENOUS
  Filled 2021-01-02 (×3): qty 100

## 2021-01-02 MED ORDER — SODIUM CHLORIDE 0.9 % IV SOLN
2.0000 g | Freq: Two times a day (BID) | INTRAVENOUS | Status: DC
  Administered 2021-01-02 – 2021-01-03 (×2): 2 g via INTRAVENOUS
  Filled 2021-01-02 (×3): qty 2

## 2021-01-02 MED ORDER — SODIUM CHLORIDE 0.9 % IV SOLN: INTRAVENOUS | Status: DC

## 2021-01-02 MED ORDER — SODIUM CHLORIDE 0.9 % IV BOLUS
500.0000 mL | Freq: Once | INTRAVENOUS | Status: AC
  Administered 2021-01-02: 07:00:00 500 mL via INTRAVENOUS

## 2021-01-02 MED ORDER — ONDANSETRON HCL 4 MG PO TABS: 4.0000 mg | ORAL_TABLET | Freq: Four times a day (QID) | ORAL | Status: DC | PRN

## 2021-01-02 MED ORDER — VANCOMYCIN HCL 500 MG/100ML IV SOLN
500.0000 mg | INTRAVENOUS | Status: DC
  Administered 2021-01-03: 08:00:00 500 mg via INTRAVENOUS
  Filled 2021-01-02: qty 100

## 2021-01-02 MED ORDER — TACROLIMUS 0.5 MG PO CAPS
0.5000 mg | ORAL_CAPSULE | Freq: Every day | ORAL | Status: DC
Start: 2021-01-02 — End: 2021-01-02
  Administered 2021-01-03: 02:00:00 0.5 mg via ORAL
  Filled 2021-01-02: qty 1

## 2021-01-02 MED ORDER — HYDROCORTISONE SOD SUC (PF) 100 MG IJ SOLR
100.0000 mg | Freq: Once | INTRAMUSCULAR | Status: AC
  Administered 2021-01-02: 06:00:00 100 mg via INTRAVENOUS
  Filled 2021-01-02: qty 2

## 2021-01-02 MED ORDER — TACROLIMUS 1 MG/ML ORAL SUSPENSION
0.5000 mg | Freq: Every day | ORAL | Status: DC
  Filled 2021-01-02 (×7): qty 0.5

## 2021-01-02 MED ORDER — HYDROCORTISONE SOD SUC (PF) 100 MG IJ SOLR: 100.0000 mg | Freq: Every day | INTRAMUSCULAR | Status: DC

## 2021-01-02 NOTE — Progress Notes (Signed)
Pharmacy Antibiotic Note  Emma Maldonado is a 70 y.o. female with a h/o renal transplant admitted on 01/01/2021 with altered mental status. Pharmacy has been consulted for vancomycin and cefepime dosing for empiric coverage of unknown source.   Plan: Vancomycin 1500 mg iv loading dose followed by 500 mg IV Q 24 hrs. Goal AUC 400-550. Expected AUC: 431 SCr used: Using SCr of 1 for more conservative dosing initially  Cefepime 2 g iv q 12 hours  Flagyl per MD  Will f/u renal function, culture results, and clinical course.  Levels if/when indicated    Height: 5' (152.4 cm) Weight: 66 kg (145 lb 8.1 oz) IBW/kg (Calculated) : 45.5  Temp (24hrs), Avg:97.4 F (36.3 C), Min:95 F (35 C), Max:99.8 F (37.7 C)  Recent Labs  Lab 12/27/20 0519 12/28/20 0550 01/01/21 2340  WBC 7.2 8.7 10.4  CREATININE 0.62 0.75 0.64  LATICACIDVEN  --   --  1.5    Estimated Creatinine Clearance: 54.7 mL/min (by C-G formula based on SCr of 0.64 mg/dL).    No Known Allergies  Antimicrobials this admission: 12/22 cefepime >>  12/22 Flagyl >>  12/22 vancomycin >>  Dose adjustments this admission:  Microbiology results: 12/21 BCx:  12/12 UCx:   12/22 MRSA PCR:   Thank you for allowing pharmacy to be a part of this patients care.  Luisa Hart D 01/02/2021 9:57 AM

## 2021-01-02 NOTE — H&P (Signed)
History and Physical    Carlei Huang WYO:[id number] DOB: 05-Dec-1950 DOA: 01/01/2021  PCP: Patient, No Pcp Per (Inactive)  Patient coming from: [facility]  Chief Complaint: altered mental status  HPI: Emma Maldonado is a 70 y.o. female with medical history significant of renal transplant on chronic immune suppression, recent proctitis, DM2, hypothyroidism, dementia. Presenting with altered mental status. History per daughter. She reports that she received a call last night stating that the patient was hypoglycemic. EMS was called and the patient was given fluids. The patient's glucose recovered. When the daughter saw the patient, the patient seemed confused and disheveled. She then became more lethargic. They rechecked her sugars and they were improved. EMS was called again. They check her vitals and found she was hypotensive. She was given fluids and brought to the ED for evaluation.   ED Course: She was found to be hypothermic. CT showed unresolved proctitis and PNA. She was started on abx. [facility] was called for admission.   Review of Systems:  Unable to obtain d/t mentation.   PMHx Past Medical History:  Diagnosis Date   Dementia (HCC)    Diabetes mellitus without complication (HCC)    Hypertension    Renal disorder    Renal insufficiency     PSHx Past Surgical History:  Procedure Laterality Date   ABDOMINAL HYSTERECTOMY     BIOPSY  12/28/2020   Procedure: BIOPSY;  Surgeon: Kathi Der, MD;  Location: [facility];  Service: Gastroenterology;;   CHOLECYSTECTOMY     FLEXIBLE SIGMOIDOSCOPY N/A 12/28/2020   Procedure: FLEXIBLE SIGMOIDOSCOPY;  Surgeon: Kathi Der, MD;  Location: [facility];  Service: Gastroenterology;  Laterality: N/A;   NEPHRECTOMY TRANSPLANTED ORGAN      SocHx  reports that she has never smoked. She has never used smokeless tobacco. She reports that she does not drink alcohol and does not use drugs.  No Known  Allergies  FamHx Family History  Problem Relation Age of Onset   Diabetes Mellitus II Father    Colon cancer Father    Colon cancer Brother     Prior to Admission medications   Medication Sig Start Date End Date Taking? Authorizing Provider  acetaminophen (TYLENOL) 500 MG tablet Take 500 mg by mouth every 6 (six) hours as needed for fever or headache.   Yes [provider]  alum & mag hydroxide-simeth (MAALOX/MYLANTA) 200-200-20 MG/5ML suspension Take 30 mLs by mouth every 6 (six) hours as needed for indigestion or heartburn.   Yes [provider]  Cholecalciferol (VITAMIN D3) 50 MCG (2000 UT) TABS Take 2,000 Units by mouth daily.   Yes [provider]  fludrocortisone (FLORINEF) 0.1 MG tablet Take 0.1 mg by mouth at bedtime.    Yes [provider]  guaifenesin (ROBITUSSIN) 100 MG/5ML syrup Take 200 mg by mouth every 6 (six) hours as needed for cough.   Yes [provider]  hydrocortisone (CORTENEMA) 100 MG/60ML enema Place 1 enema (100 mg total) rectally at bedtime for 14 days. 12/30/20 01/13/21 Yes Noralee Stain, DO  insulin aspart (NOVOLOG) 100 UNIT/ML injection Inject 20 Units into the skin See admin instructions. Inject 10 units into the skin three times a day after meals and hold if not eating   Yes [provider]  LANTUS SOLOSTAR 100 UNIT/ML Solostar Pen Inject 20 Units into the skin in the morning. Patient taking differently: Inject 24 Units into the skin in the morning. 08/26/20  Yes [name], MD  levothyroxine (SYNTHROID) 100  MCG tablet Take 100 mcg by mouth daily before breakfast. 03/04/20  Yes [provider]  loperamide (IMODIUM) 2 MG capsule Take 2 mg by mouth as needed (with each loose stool for diarrhea- cannot exceed 8 doses a day). 02/20/20  Yes [provider]  magnesium hydroxide (MILK OF MAGNESIA) 400 MG/5ML suspension Take 30 mLs by mouth at bedtime as needed for mild constipation.   Yes [provider]  memantine (NAMENDA) 10 MG tablet Take 10 mg by mouth every 12 (twelve) hours.   Yes [provider]  Multiple Vitamin (MULTIVITAMIN WITH MINERALS) TABS tablet Take 1 tablet by mouth daily. 08/26/20  Yes [name], MD  neomycin-bacitracin-polymyxin (NEOSPORIN) [phone] ointment Apply 1 application topically as needed (for minor skin tears or abrasions, after cleaning with normal saline- bandage with gauze and tape).   Yes [provider]  predniSONE (DELTASONE) 5 MG tablet Take 5 mg by mouth every morning.   Yes [provider]  QUEtiapine (SEROQUEL) 25 MG tablet Take 25 mg by mouth at bedtime.   Yes [provider]  rosuvastatin (CRESTOR) 5 MG tablet Take 5 mg by mouth at bedtime. 12/01/19  Yes [provider]  tacrolimus (PROGRAF) 0.5 MG capsule Take 1 capsule (0.5 mg total) by mouth daily. 03/28/12  Yes Dhungel, Theda Belfast, MD    Physical Exam: Vitals:   01/02/21 0621 01/02/21 0630 01/02/21 0730 01/02/21 0745  BP:  (!) 160/63 (!) 158/62 (!) 173/62  Pulse:  74 77 77  Resp:  (!) 21 18 (!) 23  Temp: 99.8 F (37.7 C)     TempSrc: Rectal     SpO2:  92% 92% 91%  Weight:      Height:        General: 70 y.o. female resting in bed in NAD Eyes: PERRL, normal sclera ENMT: Nares patent w/o discharge, orophaynx clear, dentition normal, ears w/o discharge/lesions/ulcers Neck: Supple, trachea midline Cardiovascular: RRR, +S1, S2, no m/g/r, equal pulses throughout Respiratory: decreased at bases, normal WOB GI: BS+, NDNT, no masses noted, no organomegaly noted MSK: No e/c/c Skin: No rashes, bruises, ulcerations noted Neuro: somnolent, responsive to noxious stimuli  Labs on Admission: I have personally reviewed following labs and imaging studies  CBC: Recent Labs  Lab 12/27/20 0519 12/28/20 0550 01/01/21 2340  WBC 7.2 8.7 10.4  NEUTROABS  --   --  8.3*  HGB 11.7* 12.3 12.7  HCT 35.9* 38.8 40.3  MCV 79.4* 80.0 82.4  PLT  148* 221 295   Basic Metabolic Panel: Recent Labs  Lab 12/27/20 0519 12/28/20 0550 01/01/21 2340  NA 139 142 141  K 3.3* 3.9 3.6  CL 107 107 108  CO2 24 26 27   GLUCOSE 107* 85 116*  BUN 7* 6* 14  CREATININE 0.62 0.75 0.64  CALCIUM 9.0 9.4 8.9  MG 1.5* 1.9  --    GFR: Estimated Creatinine Clearance: 54.7 mL/min (by C-G formula based on SCr of 0.64 mg/dL). Liver Function Tests: Recent Labs  Lab 01/01/21 2340  AST 27  ALT 19  ALKPHOS 65  BILITOT 0.8  PROT 6.9  ALBUMIN 3.1*   No results for input(s): LIPASE, AMYLASE in the last 168 hours. No results for input(s): AMMONIA in the last 168 hours. Coagulation Profile: Recent Labs  Lab 01/01/21 2340  INR 1.0   Cardiac Enzymes: No results for input(s): CKTOTAL, CKMB, CKMBINDEX, TROPONINI in the last 168 hours. BNP (last 3 results) No results for input(s): PROBNP in the last  8760 hours. HbA1C: No results for input(s): HGBA1C in the last 72 hours. CBG: Recent Labs  Lab 12/29/20 0736 12/29/20 1146 12/29/20 1543 12/29/20 1959 12/30/20 0752  GLUCAP 112* 209* 161* 393* 224*   Lipid Profile: No results for input(s): CHOL, HDL, LDLCALC, TRIG, CHOLHDL, LDLDIRECT in the last 72 hours. Thyroid Function Tests: No results for input(s): TSH, T4TOTAL, FREET4, T3FREE, THYROIDAB in the last 72 hours. Anemia Panel: No results for input(s): VITAMINB12, FOLATE, FERRITIN, TIBC, IRON, RETICCTPCT in the last 72 hours. Urine analysis:    Component Value Date/Time   COLORURINE YELLOW 01/01/2021 0620   APPEARANCEUR CLEAR 01/01/2021 0620   LABSPEC 1.010 01/01/2021 0620   PHURINE 7.0 01/01/2021 0620   GLUCOSEU NEGATIVE 01/01/2021 0620   HGBUR NEGATIVE 01/01/2021 0620   BILIRUBINUR NEGATIVE 01/01/2021 0620   KETONESUR NEGATIVE 01/01/2021 0620   PROTEINUR NEGATIVE 01/01/2021 0620   UROBILINOGEN 1.0 08/14/2012 1022   NITRITE NEGATIVE 01/01/2021 0620   LEUKOCYTESUR LARGE (A) 01/01/2021 0620    Radiological Exams on Admission: CT  Abdomen Pelvis Wo Contrast  Result Date: 01/02/2021 CLINICAL DATA:  70 year old female with recent rectal inflammation, proctitis. Abdominal pain. Right lower quadrant transplant kidney. EXAM: CT ABDOMEN AND PELVIS WITHOUT CONTRAST TECHNIQUE: Multidetector CT imaging of the abdomen and pelvis was performed following the standard protocol without IV contrast. COMPARISON:  CT Abdomen and Pelvis 12/23/2020 and earlier. FINDINGS: Lower chest: Stable borderline to mild cardiomegaly. No pericardial or pleural effusion. But confluent bilateral lower lobe peribronchial opacity, worse on the right, persists and is new compared to August. The airspace disease has not significantly changed from December 12th. There are multiple bilateral chronic lower rib fractures. Hepatobiliary: Negative noncontrast liver with diminutive or absent gallbladder. Pancreas: Atrophied. Spleen: Negative. Adrenals/Urinary Tract: Normal adrenal glands. Bilateral native renal atrophy. Right lower quadrant transplant kidney with prominent collecting system unchanged from earlier this month. No pararenal inflammatory stranding. Dilated urinary bladder as before. Estimated bladder volume 750 mL. Transplant ureter redemonstrated along the right anterior bladder on series 2, image 77, no definite hydroureter or periureteral stranding. No transplant nephrolithiasis identified. Stomach/Bowel: Regressed but not fully resolved circumferential rectal wall thickening and perirectal inflammatory stranding since December 12th. Redundant large bowel with diverticulosis and retained stool elsewhere. Appendix remains gas-filled and normal near the midline on series 2, image 56. Midline cecum redemonstrated. Nondilated terminal ileum. No dilated small bowel. Decompressed stomach and duodenum. No free air or free fluid. Vascular/Lymphatic: Diffusely advanced calcified atherosclerosis throughout the abdomen and pelvis. Normal caliber abdominal aorta. Vascular  patency is not evaluated in the absence of IV contrast. No lymphadenopathy. Reproductive: Surgically absent uterus, diminutive or absent ovaries. Other: No pelvic free fluid. Chronic partially calcified nodular ventral abdominal wall thickening, probably the sequelae of chronic subcutaneous injections. Musculoskeletal: Chronic lower rib fractures. Chronic lower lumbar spine degeneration. Chronic right lumbar transverse process fractures. No acute or suspicious osseous lesion. IMPRESSION: 1. Unresolved bilateral lower lobe pulmonary opacity from earlier in this month, new since August and more compatible with lung infection than atelectasis. No pleural effusion. 2. Regressed but not resolved rectal inflammation since December 12th. No complicating features. No new bowel abnormality. Large bowel diverticulosis. Normal appendix. 3. Chronic distension of the urinary bladder (750 mL). Query urinary retention. Right lower quadrant transplant kidney with no obvious inflammation or obstructive uropathy. 4. No other acute or inflammatory process identified in the noncontrast abdomen or pelvis. Aortic Atherosclerosis (ICD10-I70.0). Electronically Signed   By: Odessa Fleming M.D.   On: 01/02/2021 04:36  CT Head Wo Contrast  Result Date: 01/01/2021 CLINICAL DATA:  Altered mental status. EXAM: CT HEAD WITHOUT CONTRAST TECHNIQUE: Contiguous axial images were obtained from the base of the skull through the vertex without intravenous contrast. COMPARISON:  March 24, 2020 FINDINGS: Brain: There is mild cerebral atrophy with widening of the extra-axial spaces and ventricular dilatation. There are areas of decreased attenuation within the white matter tracts of the supratentorial brain, consistent with microvascular disease changes. Vascular: No hyperdense vessel or unexpected calcification. Skull: Normal. Negative for fracture or focal lesion. Sinuses/Orbits: There is moderate severity left maxillary sinus mucosal thickening. Other:  None. IMPRESSION: 1. Generalized cerebral atrophy. 2. No acute intracranial abnormality. 3. Moderate severity left maxillary sinus disease. Electronically Signed   By: Aram Candela M.D.   On: 01/01/2021 23:56   DG Chest Port 1 View  Result Date: 01/01/2021 CLINICAL DATA:  Weakness and hypotension. EXAM: PORTABLE CHEST 1 VIEW COMPARISON:  December 23, 2020 FINDINGS: The heart size and mediastinal contours are within normal limits. Both lungs are clear. A chronic appearing deformity is seen involving the distal right clavicle. IMPRESSION: No active disease. Electronically Signed   By: Aram Candela M.D.   On: 01/01/2021 23:39    EKG: Independently reviewed. Sinus, no st elevation  Assessment/Plan Acute metabolic encephalopathy SIRS Hypothermia secondary to unknown infection     - placed in obs, SDU     - started on broad spec abx (vanc, cefepime)     - follow Bld Cx, Ucx     - CXR is negative     - CT ab/pelvis shows incompletely resolved proctitis and possible PNA in lower lobes; let's add flagyl to the regimen for now     - continue the cortisol for now     - lactic acid is ok, BP is ok  Hx of renal transplant chronically immune suppressed    - continue   Hypothyroidism     - continue home regimen when taking PO  HLD     - continue home regimen when taking PO  Dementia     - continue home regimen when taking PO  DM2     - at her facility, she was hypoglycemic but that has resolved since she has been in the ED     - right now, let's do q4h glucose checks; if glucose is stable, can start very sensitive SSI  DVT prophylaxis: lovenox  Code Status: DNI, confirmed with daughter Youth worker) Family Communication: w/ daughter by phone  Consults called: None   Status is: Observation  The patient remains OBS appropriate and will d/c before 2 midnights.  Teddy Spike DO [facility]  If 7PM-7AM, please contact night-coverage [email or web address]  01/02/2021, 8:35 AM

## 2021-01-02 NOTE — Progress Notes (Signed)
A consult was received from an ED physician for Vancomycin per pharmacy dosing.  The patient's profile has been reviewed for ht/wt/allergies/indication/available labs.   A one time order has been placed for Vancomycin 1500mg  IV.  Further antibiotics/pharmacy consults should be ordered by admitting physician if indicated.                       Thank you, PharmD 01/02/2021  5:25 AM

## 2021-01-03 ENCOUNTER — Other Ambulatory Visit: Payer: Self-pay

## 2021-01-03 DIAGNOSIS — L8990 Pressure ulcer of unspecified site, unspecified stage: Secondary | ICD-10-CM | POA: Insufficient documentation

## 2021-01-03 DIAGNOSIS — F0390 Unspecified dementia without behavioral disturbance: Secondary | ICD-10-CM

## 2021-01-03 DIAGNOSIS — A419 Sepsis, unspecified organism: Secondary | ICD-10-CM | POA: Insufficient documentation

## 2021-01-03 DIAGNOSIS — R338 Other retention of urine: Secondary | ICD-10-CM

## 2021-01-03 DIAGNOSIS — R6510 Systemic inflammatory response syndrome (SIRS) of non-infectious origin without acute organ dysfunction: Secondary | ICD-10-CM

## 2021-01-03 LAB — GLUCOSE, CAPILLARY
Glucose-Capillary: 241 mg/dL — ABNORMAL HIGH (ref 70–99)
Glucose-Capillary: 226 mg/dL — ABNORMAL HIGH (ref 70–99)
Glucose-Capillary: 242 mg/dL — ABNORMAL HIGH (ref 70–99)
Glucose-Capillary: 350 mg/dL — ABNORMAL HIGH (ref 70–99)
Glucose-Capillary: 476 mg/dL — ABNORMAL HIGH (ref 70–99)
Glucose-Capillary: 482 mg/dL — ABNORMAL HIGH (ref 70–99)

## 2021-01-03 LAB — PROCALCITONIN: Procalcitonin: <0.1 ng/mL

## 2021-01-03 LAB — COMPREHENSIVE METABOLIC PANEL
ALT: 16 U/L (ref 0–44)
AST: 14 U/L — ABNORMAL LOW (ref 15–41)
Albumin: 2.6 g/dL — ABNORMAL LOW (ref 3.5–5.0)
Alkaline Phosphatase: 49 U/L (ref 38–126)
Anion gap: 7 (ref 5–15)
BUN: 16 mg/dL (ref 8–23)
CO2: 22 mmol/L (ref 22–32)
Calcium: 8.3 mg/dL — ABNORMAL LOW (ref 8.9–10.3)
Chloride: 114 mmol/L — ABNORMAL HIGH (ref 98–111)
Creatinine, Ser: 0.57 mg/dL (ref 0.44–1.00)
GFR, Estimated: >60 mL/min (ref >60)
Glucose, Bld: 266 mg/dL — ABNORMAL HIGH (ref 70–99)
Potassium: 3.8 mmol/L (ref 3.5–5.1)
Sodium: 143 mmol/L (ref 135–145)
Total Bilirubin: 0.9 mg/dL (ref 0.3–1.2)
Total Protein: 5.9 g/dL — ABNORMAL LOW (ref 6.5–8.1)

## 2021-01-03 LAB — CBC
HCT: 34.7 % — ABNORMAL LOW (ref 36.0–46.0)
Hemoglobin: 11.1 g/dL — ABNORMAL LOW (ref 12.0–15.0)
MCH: 25.9 pg — ABNORMAL LOW (ref 26.0–34.0)
MCHC: 32 g/dL (ref 30.0–36.0)
MCV: 80.9 fL (ref 80.0–100.0)
Platelets: 290 10*3/uL (ref 150–400)
RBC: 4.29 MIL/uL (ref 3.87–5.11)
RDW: 15.8 % — ABNORMAL HIGH (ref 11.5–15.5)
WBC: 9.3 10*3/uL (ref 4.0–10.5)
nRBC: 0 % (ref 0.0–0.2)

## 2021-01-03 LAB — PROTIME-INR
INR: 1.1 (ref 0.8–1.2)
Prothrombin Time: 14.6 seconds (ref 11.4–15.2)

## 2021-01-03 LAB — CORTISOL-AM, BLOOD: Cortisol - AM: 34.8 ug/dL — ABNORMAL HIGH (ref 6.7–22.6)

## 2021-01-03 MED ORDER — ADULT MULTIVITAMIN W/MINERALS CH: 1.0000 | ORAL_TABLET | Freq: Every day | ORAL | Status: DC

## 2021-01-03 MED ORDER — ADULT MULTIVITAMIN W/MINERALS CH
1.0000 | ORAL_TABLET | Freq: Every day | ORAL | Status: DC
  Administered 2021-01-03 – 2021-01-14 (×12): 1 via ORAL
  Filled 2021-01-03 (×12): qty 1

## 2021-01-03 MED ORDER — INSULIN ASPART 100 UNIT/ML IJ SOLN
15.0000 [IU] | Freq: Once | INTRAMUSCULAR | Status: AC
  Administered 2021-01-03: 17:00:00 15 [IU] via SUBCUTANEOUS

## 2021-01-03 MED ORDER — LEVOTHYROXINE SODIUM 100 MCG PO TABS
100.0000 ug | ORAL_TABLET | Freq: Every day | ORAL | Status: DC
  Administered 2021-01-04 – 2021-01-14 (×11): 100 ug via ORAL
  Filled 2021-01-03 (×11): qty 1

## 2021-01-03 MED ORDER — PREDNISONE 5 MG PO TABS
5.0000 mg | ORAL_TABLET | Freq: Every morning | ORAL | Status: DC
  Administered 2021-01-04 – 2021-01-14 (×11): 5 mg via ORAL
  Filled 2021-01-03 (×11): qty 1

## 2021-01-03 MED ORDER — TACROLIMUS 0.5 MG PO CAPS
0.5000 mg | ORAL_CAPSULE | Freq: Every day | ORAL | Status: DC
  Administered 2021-01-03 – 2021-01-14 (×12): 0.5 mg via ORAL
  Filled 2021-01-03 (×12): qty 1

## 2021-01-03 MED ORDER — INSULIN ASPART 100 UNIT/ML IJ SOLN
0.0000 [IU] | Freq: Every day | INTRAMUSCULAR | Status: DC
Start: 2021-01-03 — End: 2021-01-15
  Administered 2021-01-03: 22:00:00 5 [IU] via SUBCUTANEOUS
  Administered 2021-01-04: 21:00:00 2 [IU] via SUBCUTANEOUS
  Administered 2021-01-05 – 2021-01-06 (×2): 3 [IU] via SUBCUTANEOUS
  Administered 2021-01-07: 22:00:00 2 [IU] via SUBCUTANEOUS
  Administered 2021-01-08: 22:00:00 3 [IU] via SUBCUTANEOUS
  Administered 2021-01-09: 23:00:00 2 [IU] via SUBCUTANEOUS
  Administered 2021-01-12: 1 [IU] via SUBCUTANEOUS
  Administered 2021-01-14: 2 [IU] via SUBCUTANEOUS

## 2021-01-03 MED ORDER — GERHARDT'S BUTT CREAM
TOPICAL_CREAM | Freq: Three times a day (TID) | CUTANEOUS | Status: DC
  Administered 2021-01-07 – 2021-01-11 (×2): 1 via TOPICAL
  Filled 2021-01-03 (×6): qty 1

## 2021-01-03 MED ORDER — FLUDROCORTISONE ACETATE 0.1 MG PO TABS
0.1000 mg | ORAL_TABLET | Freq: Every day | ORAL | Status: DC
  Administered 2021-01-03 – 2021-01-14 (×12): 0.1 mg via ORAL
  Filled 2021-01-03 (×13): qty 1

## 2021-01-03 MED ORDER — INSULIN ASPART 100 UNIT/ML IJ SOLN
15.0000 [IU] | Freq: Once | INTRAMUSCULAR | Status: AC
  Administered 2021-01-03: 20:00:00 15 [IU] via SUBCUTANEOUS

## 2021-01-03 MED ORDER — ROSUVASTATIN CALCIUM 5 MG PO TABS
5.0000 mg | ORAL_TABLET | Freq: Every day | ORAL | Status: DC
  Administered 2021-01-03 – 2021-01-14 (×12): 5 mg via ORAL
  Filled 2021-01-03 (×12): qty 1

## 2021-01-03 MED ORDER — MEMANTINE HCL 10 MG PO TABS
10.0000 mg | ORAL_TABLET | Freq: Two times a day (BID) | ORAL | Status: DC
  Administered 2021-01-03 – 2021-01-14 (×24): 10 mg via ORAL
  Filled 2021-01-03 (×24): qty 1

## 2021-01-03 MED ORDER — INSULIN ASPART 100 UNIT/ML IJ SOLN
0.0000 [IU] | Freq: Three times a day (TID) | INTRAMUSCULAR | Status: DC
  Administered 2021-01-04: 08:00:00 5 [IU] via SUBCUTANEOUS
  Administered 2021-01-04 (×2): 11 [IU] via SUBCUTANEOUS
  Administered 2021-01-05: 09:00:00 5 [IU] via SUBCUTANEOUS
  Administered 2021-01-05: 18:00:00 15 [IU] via SUBCUTANEOUS
  Administered 2021-01-05: 13:00:00 3 [IU] via SUBCUTANEOUS
  Administered 2021-01-06: 17:00:00 15 [IU] via SUBCUTANEOUS
  Administered 2021-01-06: 13:00:00 5 [IU] via SUBCUTANEOUS
  Administered 2021-01-06: 08:00:00 3 [IU] via SUBCUTANEOUS
  Administered 2021-01-07: 09:00:00 5 [IU] via SUBCUTANEOUS
  Administered 2021-01-07 – 2021-01-08 (×3): 11 [IU] via SUBCUTANEOUS
  Administered 2021-01-08: 18:00:00 5 [IU] via SUBCUTANEOUS
  Administered 2021-01-08: 10:00:00 8 [IU] via SUBCUTANEOUS
  Administered 2021-01-09: 12:00:00 11 [IU] via SUBCUTANEOUS
  Administered 2021-01-09: 19:00:00 3 [IU] via SUBCUTANEOUS
  Administered 2021-01-09: 08:00:00 11 [IU] via SUBCUTANEOUS
  Administered 2021-01-10: 14:00:00 8 [IU] via SUBCUTANEOUS
  Administered 2021-01-10 (×2): 5 [IU] via SUBCUTANEOUS
  Administered 2021-01-11: 3 [IU] via SUBCUTANEOUS
  Administered 2021-01-11 – 2021-01-12 (×3): 8 [IU] via SUBCUTANEOUS
  Administered 2021-01-12: 3 [IU] via SUBCUTANEOUS
  Administered 2021-01-12: 11 [IU] via SUBCUTANEOUS
  Administered 2021-01-13: 5 [IU] via SUBCUTANEOUS
  Administered 2021-01-13: 3 [IU] via SUBCUTANEOUS
  Administered 2021-01-13: 5 [IU] via SUBCUTANEOUS
  Administered 2021-01-14: 2 [IU] via SUBCUTANEOUS
  Administered 2021-01-14: 8 [IU] via SUBCUTANEOUS

## 2021-01-03 MED ORDER — HYDROCORTISONE 100 MG/60ML RE ENEM
100.0000 mg | ENEMA | Freq: Every day | RECTAL | Status: DC
  Administered 2021-01-03 – 2021-01-13 (×11): 100 mg via RECTAL
  Filled 2021-01-03 (×14): qty 1

## 2021-01-03 MED ORDER — GLUCERNA SHAKE PO LIQD
237.0000 mL | Freq: Three times a day (TID) | ORAL | Status: DC
  Administered 2021-01-03 – 2021-01-14 (×25): 237 mL via ORAL
  Filled 2021-01-03 (×36): qty 237

## 2021-01-03 MED ORDER — CHLORHEXIDINE GLUCONATE CLOTH 2 % EX PADS
6.0000 | MEDICATED_PAD | Freq: Every day | CUTANEOUS | Status: DC
  Administered 2021-01-03 – 2021-01-06 (×4): 6 via TOPICAL

## 2021-01-03 MED ORDER — ENSURE MAX PROTEIN PO LIQD
11.0000 [oz_av] | Freq: Every day | ORAL | Status: DC
  Administered 2021-01-03 – 2021-01-14 (×10): 11 [oz_av] via ORAL
  Filled 2021-01-03 (×8): qty 330

## 2021-01-03 MED ORDER — QUETIAPINE FUMARATE 25 MG PO TABS
25.0000 mg | ORAL_TABLET | Freq: Every day | ORAL | Status: DC
  Administered 2021-01-03 – 2021-01-14 (×12): 25 mg via ORAL
  Filled 2021-01-03 (×12): qty 1

## 2021-01-03 NOTE — Progress Notes (Signed)
Progress Note    Linnie Delgrande   YTW:[id number]  DOB: 06/17/1950  DOA: 01/01/2021     1 PCP: Patient, No Pcp Per (Inactive)  Initial CC: AMS, lethargy  Hospital Course: Ms. Camposano is a 70 yo female with PMH dementia, renal transplant, DMII, HTN who presented with reported worsening mentation.  She was also noted to be hypoglycemic on initial evaluation with EMS.  She was also evaluated by her daughter and also felt to be more confused and lethargic.  After reevaluation she was found to be hypotensive and therefore brought to the ER for further evaluation.  She was recently discharged on 12/30/2020 after being hospitalized for proctitis.  She had also undergone flex sig on 12/28/2020 which revealed severe inflammation in the rectum with out any masses or lesions.  She was also evaluated by infectious disease during her hospitalization due to positive blood cultures which were felt to be contamination.  She had been treated with some antibiotics during hospital course as well. She was discharged with cortenema for her rectal inflammation to continue at discharge.   She was started on broad spectrum abx on admission and admitted for further workup.   Interval History:  Sitting up in recliner this morning.  iPad interpreter used for Spanish.  Patient poor historian due to underlying dementia.  She did endorse lower abdominal discomfort likely from her distended bladder at the time.  Also endorsed ongoing discomfort involving her rectum from ongoing treatment for proctitis.  Assessment & Plan: * Hypothermia-resolved as of 01/03/2021, (present on admission) - resolved   Acute urinary retention - CT abdomen/pelvis showed significantly distended bladder.  Patient also has been noted to have what appears to be overflow incontinence reported by staff.  Bedside bladder scan this morning showed at least 800 cc in bladder.  Given her debilitated state, dementia, recurrent hospitalization, at risk  for ongoing retention, therefore will place indwelling Foley catheter and she will need outpatient follow-up with urology for TOV - Catheter placed this morning, nurse voided approximately 900 cc after catheter placement  SIRS (systemic inflammatory response syndrome) (HCC) - ongoing rectal inflammation and ongoing B/L LL opacities seen on prior CT 12/12 as well (therefore less likely to be uncontrolled PNA at this time) - given vanc/cefepime/flagyl on admission; no leukocytosis nor fever/hypothermia noted. Negative PCT - at this time, will d/c all abx and monitor off for now - d/c stress dose steroids as BP also is rapidly improved   Acute metabolic encephalopathy-resolved as of 01/03/2021, (present on admission) - possibly from hypoglycemia and hypothermia on admission; unclear if truly precipitated by infection given rapid improvement   Dementia without behavioral disturbance (HCC) - mentation is now baseline per daughter; patient not a great historian at baseline   History of renal transplant - Continue prednisone and tacrolimus - No abnormalities with transplanted kidney appreciated on CT abdomen/pelvis    Old records reviewed in assessment of this patient  Antimicrobials: Cefepime 12/22 x 2 doses Vanc 12/22 x 1 dose Flagyl 12/22 x 3 doses  DVT prophylaxis: Lovenox  Code Status:   Code Status: Partial Code  Disposition Plan:   Status is: Inpt  Objective: Blood pressure (!) 178/68, pulse 75, temperature 97.8 F (36.6 C), temperature source Oral, resp. rate 20, height 5' (1.524 m), weight 69.1 kg, SpO2 98 %.  Examination:  Physical Exam Constitutional:      Appearance: Normal appearance.  HENT:     Head: Normocephalic and atraumatic.     Mouth/Throat:  Mouth: Mucous membranes are moist.  Eyes:     Extraocular Movements: Extraocular movements intact.  Cardiovascular:     Rate and Rhythm: Normal rate and regular rhythm.  Pulmonary:     Effort: Pulmonary effort  is normal.     Breath sounds: Normal breath sounds.  Abdominal:     General: Bowel sounds are normal.     Palpations: Abdomen is soft.     Comments: Palpable bladder appreciated in lower abdomen   Musculoskeletal:     Cervical back: Normal range of motion and neck supple.  Skin:    General: Skin is warm and dry.  Neurological:     General: No focal deficit present.     Mental Status: She is alert.     Comments: Dementia appreciated   Psychiatric:        Mood and Affect: Mood normal.        Behavior: Behavior normal.     Consultants:    Procedures:    Data Reviewed: I have personally reviewed labs and imaging studies    LOS: 1 day  Time spent: Greater than 50% of the 35 minute visit was spent in counseling/coordination of care for the patient as laid out in the A&P.   Lewie Chamber, MD [facility] 01/03/2021, 2:37 PM

## 2021-01-03 NOTE — Assessment & Plan Note (Addendum)
-   Hypothermia, tachypnea.  Source unclear on admission but after work-up, appears to be probable UTI  - ongoing rectal inflammation and ongoing B/L LL opacities seen on prior CT 12/12 as well (therefore less likely to be uncontrolled PNA at this time) - given vanc/cefepime/flagyl on admission; negative PCT -Emma Maldonado was monitored off of antibiotics after admission - Given her dementia, difficult to know her symptom complaint although Emma Maldonado did have significant urinary retention requiring placement of Foley catheter on 01/03/2021 and now urine culture is growing E faecalis; therefore, will initiate treatment to treat for presumed UTI contributing to her clinical presentation on admission -continue amoxicillin

## 2021-01-03 NOTE — Progress Notes (Addendum)
Inpatient Diabetes Program Recommendations  AACE/ADA: New Consensus Statement on Inpatient Glycemic Control (2015)  Target Ranges:  Prepandial:   less than 140 mg/dL      Peak postprandial:   less than 180 mg/dL (1-2 hours)      Critically ill patients:  140 - 180 mg/dL   Lab Results  Component Value Date   GLUCAP 242 (H) 01/03/2021   HGBA1C 9.6 (H) 12/24/2020    Review of Glycemic Control  Latest Reference Range & Units 01/02/21 11:51 01/02/21 18:11 01/02/21 19:59 01/03/21 02:08 01/03/21 05:38 01/03/21 07:40  Glucose-Capillary 70 - 99 mg/dL 628 (H) 315 (H) 176 (H) 241 (H) 226 (H) 242 (H)   Diabetes history: DM 2 Outpatient Diabetes medications:  Novolog 10 units tid with meals (after meals), Lantus 24 units q AM-lives at facility on memory care unit Current orders for Inpatient glycemic control:  Novolog 0-6 units tid with meals  Inpatient Diabetes Program Recommendations:    Note patient admitted last night.  Had low blood sugar at facility and EMS had to be called. Continued to be disheveled and so EMS was called back.   Consider adding Semglee 10 units daily. May need adjustment in home meds prior to d/c back to facility.   Thanks,  Beryl Meager, RN, BC-ADM Inpatient Diabetes Coordinator Pager [phone]  (8a-5p)

## 2021-01-03 NOTE — Assessment & Plan Note (Addendum)
-   CT abdomen/pelvis showed significantly distended bladder.  Patient also has been noted to have what appears to be overflow incontinence reported by staff. Bedside bladder scan on 12/23 showed at least 800 cc in bladder.  Given her debilitated state, dementia, recurrent hospitalization, at risk for ongoing retention, therefore will place indwelling Foley catheter -Weakness has improved and she was ambulating well with physical therapy this morning - Foley has been in since 01/03/2021 - foley removed on 12/26; she required I&O multiple times for 24 hours so unfortunately has failed TOV.  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  Therefore, catheter will be replaced on 01/08/2021.  I have contacted her urology office at [facility], the soonest appointment is March which she is scheduled for but they will also call for any cancellations prior to then -She may not be able to go back to [facility] with indwelling Foley, therefore alternative facility may need to also be pursued.  Will be discussed at progression rounds

## 2021-01-03 NOTE — Hospital Course (Addendum)
Emma Maldonado is a 70 yo female with PMH dementia, renal transplant, DMII, HTN who presented with reported worsening mentation.  She was also noted to be hypoglycemic on initial evaluation with EMS.  She was also evaluated by her daughter and also felt to be more confused and lethargic.  After reevaluation she was found to be hypotensive and therefore brought to the ER for further evaluation.  She was recently discharged on 12/30/2020 after being hospitalized for proctitis.  She had also undergone flex sig on 12/28/2020 which revealed severe inflammation in the rectum with out any masses or lesions.  She was also evaluated by infectious disease during her hospitalization due to positive blood cultures which were felt to be contamination.  She had been treated with some antibiotics during hospital course as well. She was discharged with cortenema for her rectal inflammation to continue at discharge.   She was started on broad spectrum abx on admission and admitted for further workup.

## 2021-01-03 NOTE — Assessment & Plan Note (Addendum)
-   Continue prednisone and tacrolimus - No abnormalities with transplanted kidney appreciated on CT abdomen/pelvis -Follows closely with [facility], recently seen October 2022

## 2021-01-03 NOTE — Progress Notes (Signed)
Patient had 4 type 7 brown stools from 7a-7p.  Bradd Burner, RN

## 2021-01-03 NOTE — Progress Notes (Signed)
Bladder scan revealed ~850 mL urine.  Foley catheter inserted per MD order, 900 mL clear yellow urine returned.  Patient tolerated well.  Bradd Burner, RN

## 2021-01-03 NOTE — TOC Initial Note (Signed)
Transition of Care [facility]) - Initial/Assessment Note    Patient Details  Name: Emma Maldonado MRN: [id number] Date of Birth: Feb 05, 1950  Transition of Care (TOC) CM/SW Contact:    Armanda Heritage, RN Phone Number: 01/03/2021, 2:24 PM  Clinical Narrative:                 CM noted patient is from [facility] memory care.  Spoke with [employer] rep who states patient is ambulatory with rollator at baseline and requires prompts for eating and is able to follow commands.  Per [employer], if patient remains at baseline functionally facility will be able to take patient back when medically stable.  Should medical stability occur over the weekend, TOC will need to contact facility main number to arrange for return.  Of note, PT/OT evals are pending.  CM made contact with patients daughter and discussed discharge planning.  Daughter is aware that is patient remains at baseline plan will be to return to [facility] and is in agreement.  TOC will follow for therapy recommendations.  Expected Discharge Plan: Assisted Living Barriers to Discharge: Continued Medical Work up   Patient Goals and CMS Choice Patient states their goals for this hospitalization and ongoing recovery are:: to return to ALF      Expected Discharge Plan and Services Expected Discharge Plan: Assisted Living   Discharge Planning Services: CM Consult   Living arrangements for the past 2 months: Assisted Living Facility                                      Prior Living Arrangements/Services Living arrangements for the past 2 months: Assisted Living Facility Lives with:: Facility Resident Patient language and need for interpreter reviewed:: Yes Do you feel safe going back to the place where you live?: Yes      Need for Family Participation in Patient Care: Yes (Comment) Care giver support system in place?: Yes (comment)   Criminal Activity/Legal Involvement Pertinent to Current  Situation/Hospitalization: No - Comment as needed  Activities of Daily Living      Permission Sought/Granted                  Emotional Assessment Appearance:: Appears stated age     Orientation: : Fluctuating Orientation (Suspected and/or reported Sundowners) Alcohol / Substance Use: Not Applicable Psych Involvement: No (comment)  Admission diagnosis:  Hypothermia [T68.XXXA] HCAP (healthcare-associated pneumonia) [J18.9] Hypothermia, initial encounter [T68.XXXA] Acute metabolic encephalopathy [G93.41] Patient Active Problem List   Diagnosis Date Noted   Pressure injury of skin 01/03/2021   Hypothermia 01/02/2021   Acute metabolic encephalopathy 01/02/2021   Bacteremia    Severe sepsis (HCC) 12/23/2020   Hypothyroidism 12/23/2020   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   Hypomagnesemia    Acute renal failure (HCC)    Hypotension 08/22/2020   Hydronephrosis of right kidney 05/01/2020   Rib fractures 05/01/2020   UTI (urinary tract infection) 03/25/2020   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 [place] [address] Phone: [phone] Fax: [phone]     Social Determinants of Health (SDOH) Interventions    Readmission Risk Interventions Readmission Risk Prevention Plan 01/03/2021  Transportation Screening Complete  HRI or Home Care Consult Complete  Social Work Consult for Recovery Care Planning/Counseling  Complete  Palliative Care Screening Not Applicable  Medication Review Oceanographer) Complete  Some recent data might be hidden

## 2021-01-03 NOTE — Assessment & Plan Note (Signed)
-   mentation is now baseline per daughter; patient not a great historian at baseline

## 2021-01-03 NOTE — Assessment & Plan Note (Signed)
resolved 

## 2021-01-03 NOTE — Assessment & Plan Note (Signed)
-   possibly from hypoglycemia and hypothermia on admission; unclear if truly precipitated by infection given rapid improvement

## 2021-01-03 NOTE — Progress Notes (Signed)
Initial Nutrition Assessment  DOCUMENTATION CODES:  Not applicable  INTERVENTION:  Add Glucerna Shake po TID, each supplement provides 220 kcal and 10 grams of protein.  Add Ensure Max po daily, each supplement provides 150 kcal and 30 grams of protein.    Add MVI with minerals daily.  Encourage PO and supplement intake.  NUTRITION DIAGNOSIS:  Increased nutrient needs related to acute illness as evidenced by estimated needs.  GOAL:  Patient will meet greater than or equal to 90% of their needs  MONITOR:  PO intake, Supplement acceptance, Labs, Weight trends, Skin, I & O's  REASON FOR ASSESSMENT:  Malnutrition Screening Tool    ASSESSMENT:  70 yo female who is Spanish-speaking with a PMH of renal transplant on chronic immune suppression, recent proctitis, DM2, hypothyroidism, dementia. Presenting with altered mental status. History per daughter. She reports that she received a call last night stating that the patient was hypoglycemic. EMS was called and the patient was given fluids. The patient's glucose recovered. When the daughter saw the patient, the patient seemed confused and disheveled. She then became more lethargic. They rechecked her sugars and they were improved. EMS was called again. They check her vitals and found she was hypotensive. She was given fluids and brought to the ED for evaluation.  Per Epic, pt ate a 100% CLD breakfast this morning and was transitioned a carb modified diet right after.  No family at bedside. Pt able to report that she is hungry and has an appetite.   She reports some weight loss, but does not know how much.  Just seen by another RD about a week ago.   Weight has since increased since previous admission.  New stage 2 sacral ulcer.  Medications: reviewed; SSI, NaCl @ 100 ml/hr, IV ABX  Labs: reviewed; CBG 226-350 (H) HbA1c: 9.6% (12/24/2020)  NUTRITION - FOCUSED PHYSICAL EXAM: Flowsheet Row Most Recent Value  Orbital Region No  depletion  Upper Arm Region Mild depletion  Thoracic and Lumbar Region No depletion  Buccal Region No depletion  Temple Region Moderate depletion  Clavicle Bone Region No depletion  Clavicle and Acromion Bone Region No depletion  Scapular Bone Region No depletion  Dorsal Hand No depletion  Patellar Region No depletion  Anterior Thigh Region No depletion  Posterior Calf Region Mild depletion  Edema (RD Assessment) None  Hair Reviewed  Eyes Reviewed  Mouth Reviewed  Skin Reviewed  Nails Reviewed   Diet Order:   Diet Order             Diet Carb Modified Fluid consistency: Thin; Room service appropriate? Yes  Diet effective now                  EDUCATION NEEDS:  Education needs have been addressed  Skin:  Skin Assessment: Skin Integrity Issues: Skin Integrity Issues:: Stage II Stage II: Sacrum  Last BM:  01/03/21 - Type 6, large  Height:  Ht Readings from Last 1 Encounters:  01/01/21 5' (1.524 m)   Weight:  Wt Readings from Last 1 Encounters:  01/03/21 69.1 kg   BMI:  Body mass index is 29.75 kg/m.  Estimated Nutritional Needs:  Kcal:  1700-1900 Protein:  75-90 grams Fluid:  >1.7 L  Vertell Limber, RD, LDN (she/her/hers) Clinical Inpatient Dietitian RD Pager/After-Hours/Weekend Pager # in [place]

## 2021-01-03 NOTE — Evaluation (Addendum)
Physical Therapy Evaluation Patient Details Name: Emma Maldonado MRN: [id number] DOB: 09/11/1950 Today's Date: 01/03/2021  History of Present Illness  70 yo Spanish Speaking female admitted from memory care unit with AMS, UTI, SIRS. Hx of dementia, DM, COVID, renal transplant. Recent d/c 12/19  Clinical Impression  On eval, pt was Min guard-Min A for mobility. She pivoted to bsc then took a few steps around the room with a RW. Pt followed commands well with rehab tech translating. No family present during session. Will plan to follow and progress activity as tolerated. Will update recommendations as necessary.      Recommendations for follow up therapy are one component of a multi-disciplinary discharge planning process, led by the attending physician.  Recommendations may be updated based on patient status, additional functional criteria and insurance authorization.  Follow Up Recommendations No PT follow up    Assistance Recommended at Discharge Frequent or constant Supervision/Assistance  Functional Status Assessment Patient has had a recent decline in their functional status and demonstrates the ability to make significant improvements in function in a reasonable and predictable amount of time.  Equipment Recommendations  None recommended by PT    Recommendations for Other Services       Precautions / Restrictions Precautions Precautions: Fall Precaution Comments: incontinence Restrictions Weight Bearing Restrictions: No      Mobility  Bed Mobility Overal bed mobility: Needs Assistance Bed Mobility: Supine to Sit;Sit to Supine     Supine to sit: Min assist;HOB elevated Sit to supine: Min guard   General bed mobility comments: Min A for trunk and to scoot to EOB. Increased time.    Transfers Overall transfer level: Needs assistance Equipment used: Rolling walker (2 wheels) Transfers: Sit to/from Stand;Bed to chair/wheelchair/BSC Sit to Stand: Min guard   Step  pivot transfers: Min guard       General transfer comment: x 2. Cues provided. Min guard with RW.    Ambulation/Gait Ambulation/Gait assistance: Min guard Gait Distance (Feet): 5 Feet Assistive device: Rolling walker (2 wheels) Gait Pattern/deviations: Step-through pattern;Decreased stride length       General Gait Details: walked a short distance from bsc to [facility] with RW. Min guard assist. Deferred further ambulation 2* bowel incontinence.  Stairs            Wheelchair Mobility    Modified Rankin (Stroke Patients Only)       Balance Overall balance assessment: Needs assistance         Standing balance support: Bilateral upper extremity supported;Reliant on assistive device for balance Standing balance-Leahy Scale: Fair                               Pertinent Vitals/Pain Pain Assessment: Faces Faces Pain Scale: No hurt    Home Living Family/patient expects to be discharged to:: Assisted living     Type of Home: Assisted living (Memory Care) Home Access: Level entry       Home Layout: One level   Additional Comments: Patient unreliable historian. Suspect needs assistance for mobility and ADLs - for safety.    Prior Function               Mobility Comments: pt reports she did not use a device. Per Chart review-uses rollator ADLs Comments: likely     Hand Dominance        Extremity/Trunk Assessment   Upper Extremity Assessment Upper Extremity Assessment: Generalized weakness  Lower Extremity Assessment Lower Extremity Assessment: Generalized weakness    Cervical / Trunk Assessment Cervical / Trunk Assessment: Normal  Communication      Cognition Arousal/Alertness: Awake/alert Behavior During Therapy: WFL for tasks assessed/performed Overall Cognitive Status: History of cognitive impairments - at baseline                                          General Comments      Exercises      Assessment/Plan    PT Assessment Patient needs continued PT services  PT Problem List Decreased strength;Decreased mobility;Decreased activity tolerance;Decreased balance;Decreased knowledge of use of DME       PT Treatment Interventions DME instruction;Gait training;Therapeutic activities;Therapeutic exercise;Patient/family education;Balance training;Functional mobility training    PT Goals (Current goals can be found in the Care Plan section)  Acute Rehab PT Goals Patient Stated Goal: none stated PT Goal Formulation: Patient unable to participate in goal setting Time For Goal Achievement: 01/17/21 Potential to Achieve Goals: Fair    Frequency Min 3X/week   Barriers to discharge        Co-evaluation               AM-PAC PT "6 Clicks" Mobility  Outcome Measure Help needed turning from your back to your side while in a flat bed without using bedrails?: A Little Help needed moving from lying on your back to sitting on the side of a flat bed without using bedrails?: A Little Help needed moving to and from a bed to a chair (including a wheelchair)?: A Little Help needed standing up from a chair using your arms (e.g., wheelchair or bedside chair)?: A Little Help needed to walk in hospital room?: A Little Help needed climbing 3-5 steps with a railing? : A Lot 6 Click Score: 17    End of Session   Activity Tolerance: Patient tolerated treatment well Patient left: in bed;with call bell/phone within reach;with bed alarm set   PT Visit Diagnosis: Muscle weakness (generalized) (M62.81);Difficulty in walking, not elsewhere classified (R26.2)    Time: 5093-2671 PT Time Calculation (min) (ACUTE ONLY): 18 min   Charges:   PT Evaluation $PT Eval Moderate Complexity: 1 Mod             Faye Ramsay, PT Acute Rehabilitation  Office: [phone] Pager: [phone]

## 2021-01-04 ENCOUNTER — Other Ambulatory Visit: Payer: Self-pay

## 2021-01-04 DIAGNOSIS — A419 Sepsis, unspecified organism: Principal | ICD-10-CM

## 2021-01-04 LAB — CBC WITH DIFFERENTIAL/PLATELET
Abs Immature Granulocytes: 0.04 10*3/uL (ref 0.00–0.07)
Basophils Absolute: 0 10*3/uL (ref 0.0–0.1)
Basophils Relative: 0 %
Eosinophils Absolute: 0 10*3/uL (ref 0.0–0.5)
Eosinophils Relative: 0 %
HCT: 33.3 % — ABNORMAL LOW (ref 36.0–46.0)
Hemoglobin: 10.7 g/dL — ABNORMAL LOW (ref 12.0–15.0)
Immature Granulocytes: 0 %
Lymphocytes Relative: 23 %
Lymphs Abs: 2.3 10*3/uL (ref 0.7–4.0)
MCH: 25.8 pg — ABNORMAL LOW (ref 26.0–34.0)
MCHC: 32.1 g/dL (ref 30.0–36.0)
MCV: 80.4 fL (ref 80.0–100.0)
Monocytes Absolute: 0.5 10*3/uL (ref 0.1–1.0)
Monocytes Relative: 5 %
Neutro Abs: 7 10*3/uL (ref 1.7–7.7)
Neutrophils Relative %: 72 %
Platelets: 280 10*3/uL (ref 150–400)
RBC: 4.14 MIL/uL (ref 3.87–5.11)
RDW: 15.3 % (ref 11.5–15.5)
WBC: 9.8 10*3/uL (ref 4.0–10.5)
nRBC: 0 % (ref 0.0–0.2)

## 2021-01-04 LAB — BASIC METABOLIC PANEL
Anion gap: 7 (ref 5–15)
BUN: 16 mg/dL (ref 8–23)
CO2: 24 mmol/L (ref 22–32)
Calcium: 8.4 mg/dL — ABNORMAL LOW (ref 8.9–10.3)
Chloride: 113 mmol/L — ABNORMAL HIGH (ref 98–111)
Creatinine, Ser: 0.5 mg/dL (ref 0.44–1.00)
GFR, Estimated: >60 mL/min (ref >60)
Glucose, Bld: 265 mg/dL — ABNORMAL HIGH (ref 70–99)
Potassium: 2.6 mmol/L — CL (ref 3.5–5.1)
Sodium: 144 mmol/L (ref 135–145)

## 2021-01-04 LAB — GLUCOSE, CAPILLARY
Glucose-Capillary: 213 mg/dL — ABNORMAL HIGH (ref 70–99)
Glucose-Capillary: 297 mg/dL — ABNORMAL HIGH (ref 70–99)
Glucose-Capillary: 328 mg/dL — ABNORMAL HIGH (ref 70–99)
Glucose-Capillary: 304 mg/dL — ABNORMAL HIGH (ref 70–99)
Glucose-Capillary: 230 mg/dL — ABNORMAL HIGH (ref 70–99)

## 2021-01-04 LAB — URINE CULTURE: Culture: >=100000 — AB

## 2021-01-04 LAB — MAGNESIUM: Magnesium: 1.7 mg/dL (ref 1.7–2.4)

## 2021-01-04 MED ORDER — HYDRALAZINE HCL 20 MG/ML IJ SOLN
5.0000 mg | Freq: Once | INTRAMUSCULAR | Status: AC
  Administered 2021-01-04: 21:00:00 5 mg via INTRAVENOUS
  Filled 2021-01-04: qty 1

## 2021-01-04 MED ORDER — POTASSIUM CHLORIDE CRYS ER 20 MEQ PO TBCR
40.0000 meq | EXTENDED_RELEASE_TABLET | Freq: Once | ORAL | Status: AC
  Administered 2021-01-04: 10:00:00 40 meq via ORAL
  Filled 2021-01-04: qty 2

## 2021-01-04 MED ORDER — MAGNESIUM SULFATE 2 GM/50ML IV SOLN
2.0000 g | Freq: Once | INTRAVENOUS | Status: AC
  Administered 2021-01-04: 09:00:00 2 g via INTRAVENOUS
  Filled 2021-01-04: qty 50

## 2021-01-04 MED ORDER — POTASSIUM CHLORIDE CRYS ER 20 MEQ PO TBCR
40.0000 meq | EXTENDED_RELEASE_TABLET | Freq: Two times a day (BID) | ORAL | Status: DC
  Administered 2021-01-04: 07:00:00 40 meq via ORAL
  Filled 2021-01-04: qty 2

## 2021-01-04 MED ORDER — POTASSIUM CHLORIDE 10 MEQ/100ML IV SOLN
10.0000 meq | INTRAVENOUS | Status: AC
  Administered 2021-01-04 (×4): 10 meq via INTRAVENOUS
  Filled 2021-01-04 (×4): qty 100

## 2021-01-04 MED ORDER — AMOXICILLIN 250 MG PO CAPS
500.0000 mg | ORAL_CAPSULE | Freq: Three times a day (TID) | ORAL | Status: DC
  Administered 2021-01-04 – 2021-01-10 (×19): 500 mg via ORAL
  Filled 2021-01-04 (×19): qty 2

## 2021-01-04 NOTE — Plan of Care (Signed)
Discussed with patient plan of care for the evening, pain management and blood pressure medications with some teach back displayed.  Problem: Education: Goal: Knowledge of General Education information will improve Description: Including pain rating scale, medication(s)/side effects and non-pharmacologic comfort measures Outcome: Progressing   Problem: Health Behavior/Discharge Planning: Goal: Ability to manage health-related needs will improve Outcome: Progressing

## 2021-01-04 NOTE — Progress Notes (Signed)
Patient had 3-4 very small, mucus bowel movements today.  Per lab, none amounted to the 10 mL necessary for GI panel.  Bradd Burner, RN

## 2021-01-04 NOTE — Progress Notes (Signed)
Progress Note    Hanne Kegg   KDX:[id number]  DOB: 1950-06-15  DOA: 01/01/2021     2 PCP: Patient, No Pcp Per (Inactive)  Initial CC: AMS, lethargy  Hospital Course: Ms. Ayars is a 70 yo female with PMH dementia, renal transplant, DMII, HTN who presented with reported worsening mentation.  She was also noted to be hypoglycemic on initial evaluation with EMS.  She was also evaluated by her daughter and also felt to be more confused and lethargic.  After reevaluation she was found to be hypotensive and therefore brought to the ER for further evaluation.  She was recently discharged on 12/30/2020 after being hospitalized for proctitis.  She had also undergone flex sig on 12/28/2020 which revealed severe inflammation in the rectum with out any masses or lesions.  She was also evaluated by infectious disease during her hospitalization due to positive blood cultures which were felt to be contamination.  She had been treated with some antibiotics during hospital course as well. She was discharged with cortenema for her rectal inflammation to continue at discharge.   She was started on broad spectrum abx on admission and admitted for further workup.   Interval History:  No events overnight.  Foley catheter remains in place and abdominal pain/distention has improved since yesterday.  She is still noting a little bit of left lower quadrant abdominal pain but unable to elaborate on this due to her underlying dementia.  Otherwise comfortably sitting in recliner; plan at this time is now to await further disposition regarding discharge to SNF.  Assessment & Plan: * Hypothermia-resolved as of 01/03/2021, (present on admission) - resolved   Acute urinary retention - CT abdomen/pelvis showed significantly distended bladder.  Patient also has been noted to have what appears to be overflow incontinence reported by staff. Bedside bladder scan on 12/23 showed at least 800 cc in bladder.  Given her  debilitated state, dementia, recurrent hospitalization, at risk for ongoing retention, therefore will place indwelling Foley catheter and she will need outpatient follow-up with urology for TOV - continue foley, placed on 01/03/21 - see sepsis also   Sepsis (HCC) - Hypothermia, tachypnea.  Source unclear on admission but after work-up, appears to be probable UTI  - ongoing rectal inflammation and ongoing B/L LL opacities seen on prior CT 12/12 as well (therefore less likely to be uncontrolled PNA at this time) - given vanc/cefepime/flagyl on admission; negative PCT -She was monitored off of antibiotics after admission - Given her dementia, difficult to know her symptom complaint although she did have significant urinary retention requiring placement of Foley catheter on 01/03/2021 and now urine culture is growing E faecalis; therefore, will initiate treatment to treat for presumed UTI contributing to her clinical presentation on admission -Start on amoxicillin  Acute metabolic encephalopathy-resolved as of 01/03/2021, (present on admission) - possibly from hypoglycemia and hypothermia on admission; unclear if truly precipitated by infection given rapid improvement   Dementia without behavioral disturbance (HCC) - mentation is now baseline per daughter; patient not a great historian at baseline   History of renal transplant - Continue prednisone and tacrolimus - No abnormalities with transplanted kidney appreciated on CT abdomen/pelvis    Old records reviewed in assessment of this patient  Antimicrobials: Cefepime 12/22 x 2 doses Vanc 12/22 x 1 dose Flagyl 12/22 x 3 doses Amoxicillin 12/24 >> current   DVT prophylaxis: Lovenox  Code Status:   Code Status: Partial Code  Disposition Plan:   Status is: Inpt  Objective: Blood pressure (!) 172/57, pulse (!) 58, temperature 98.6 F (37 C), temperature source Oral, resp. rate (!) 21, height 5' (1.524 m), weight 70.2 kg, SpO2 97 %.   Examination:  Physical Exam Constitutional:      Appearance: Normal appearance.  HENT:     Head: Normocephalic and atraumatic.     Mouth/Throat:     Mouth: Mucous membranes are moist.  Eyes:     Extraocular Movements: Extraocular movements intact.  Cardiovascular:     Rate and Rhythm: Normal rate and regular rhythm.  Pulmonary:     Effort: Pulmonary effort is normal.     Breath sounds: Normal breath sounds.  Abdominal:     General: Bowel sounds are normal. There is no distension.     Palpations: Abdomen is soft.     Tenderness: There is no abdominal tenderness.  Musculoskeletal:     Cervical back: Normal range of motion and neck supple.  Skin:    General: Skin is warm and dry.  Neurological:     General: No focal deficit present.     Mental Status: She is alert.     Comments: Dementia appreciated   Psychiatric:        Mood and Affect: Mood normal.        Behavior: Behavior normal.     Consultants:    Procedures:    Data Reviewed: I have personally reviewed labs and imaging studies    LOS: 2 days  Time spent: Greater than 50% of the 35 minute visit was spent in counseling/coordination of care for the patient as laid out in the A&P.   Lewie Chamber, MD [facility] 01/04/2021, 2:25 PM

## 2021-01-04 NOTE — Progress Notes (Signed)
OT Cancellation Note  Patient Details Name: Emma Maldonado MRN: [id number] DOB: 1950/10/29   Cancelled Treatment:    Reason Eval/Treat Not Completed: Medical issues which prohibited therapy Patient was noted to have low potassium (2.6) this AM with increased HR and BP in afternoon. Ot to continue to follow and check back as schedule will allow.  Sharyn Blitz OTR/L, MS Acute Rehabilitation Department Office# [phone] Pager# [phone]   01/04/2021, 3:44 PM

## 2021-01-05 DIAGNOSIS — G9341 Metabolic encephalopathy: Secondary | ICD-10-CM

## 2021-01-05 DIAGNOSIS — K529 Noninfective gastroenteritis and colitis, unspecified: Secondary | ICD-10-CM

## 2021-01-05 LAB — CBC WITH DIFFERENTIAL/PLATELET
Abs Immature Granulocytes: 0.02 10*3/uL (ref 0.00–0.07)
Basophils Absolute: 0 10*3/uL (ref 0.0–0.1)
Basophils Relative: 1 %
Eosinophils Absolute: 0 10*3/uL (ref 0.0–0.5)
Eosinophils Relative: 1 %
HCT: 35.9 % — ABNORMAL LOW (ref 36.0–46.0)
Hemoglobin: 11.8 g/dL — ABNORMAL LOW (ref 12.0–15.0)
Immature Granulocytes: 0 %
Lymphocytes Relative: 28 %
Lymphs Abs: 2 10*3/uL (ref 0.7–4.0)
MCH: 25.7 pg — ABNORMAL LOW (ref 26.0–34.0)
MCHC: 32.9 g/dL (ref 30.0–36.0)
MCV: 78 fL — ABNORMAL LOW (ref 80.0–100.0)
Monocytes Absolute: 0.5 10*3/uL (ref 0.1–1.0)
Monocytes Relative: 7 %
Neutro Abs: 4.6 10*3/uL (ref 1.7–7.7)
Neutrophils Relative %: 63 %
Platelets: 294 10*3/uL (ref 150–400)
RBC: 4.6 MIL/uL (ref 3.87–5.11)
RDW: 15.7 % — ABNORMAL HIGH (ref 11.5–15.5)
WBC: 7.2 10*3/uL (ref 4.0–10.5)
nRBC: 0 % (ref 0.0–0.2)

## 2021-01-05 LAB — MAGNESIUM: Magnesium: 1.9 mg/dL (ref 1.7–2.4)

## 2021-01-05 LAB — GLUCOSE, CAPILLARY
Glucose-Capillary: 207 mg/dL — ABNORMAL HIGH (ref 70–99)
Glucose-Capillary: 198 mg/dL — ABNORMAL HIGH (ref 70–99)
Glucose-Capillary: 414 mg/dL — ABNORMAL HIGH (ref 70–99)
Glucose-Capillary: 346 mg/dL — ABNORMAL HIGH (ref 70–99)
Glucose-Capillary: 267 mg/dL — ABNORMAL HIGH (ref 70–99)

## 2021-01-05 LAB — BASIC METABOLIC PANEL
Anion gap: 4 — ABNORMAL LOW (ref 5–15)
BUN: 10 mg/dL (ref 8–23)
CO2: 26 mmol/L (ref 22–32)
Calcium: 7.9 mg/dL — ABNORMAL LOW (ref 8.9–10.3)
Chloride: 112 mmol/L — ABNORMAL HIGH (ref 98–111)
Creatinine, Ser: 0.37 mg/dL — ABNORMAL LOW (ref 0.44–1.00)
GFR, Estimated: >60 mL/min (ref >60)
Glucose, Bld: 160 mg/dL — ABNORMAL HIGH (ref 70–99)
Potassium: 3.2 mmol/L — ABNORMAL LOW (ref 3.5–5.1)
Sodium: 142 mmol/L (ref 135–145)

## 2021-01-05 MED ORDER — POTASSIUM CHLORIDE CRYS ER 20 MEQ PO TBCR
40.0000 meq | EXTENDED_RELEASE_TABLET | ORAL | Status: AC
  Administered 2021-01-05 (×2): 40 meq via ORAL
  Filled 2021-01-05 (×2): qty 2

## 2021-01-05 MED ORDER — INSULIN ASPART 100 UNIT/ML IJ SOLN
11.0000 [IU] | Freq: Once | INTRAMUSCULAR | Status: AC
Start: 2021-01-05 — End: 2021-01-05
  Administered 2021-01-05: 19:00:00 11 [IU] via SUBCUTANEOUS

## 2021-01-05 MED ORDER — MAGNESIUM SULFATE 2 GM/50ML IV SOLN
2.0000 g | Freq: Once | INTRAVENOUS | Status: AC
  Administered 2021-01-05: 10:00:00 2 g via INTRAVENOUS
  Filled 2021-01-05: qty 50

## 2021-01-05 NOTE — Progress Notes (Signed)
Progress Note    Emma Maldonado   VQQ:[id number]  DOB: 01/21/1950  DOA: 01/01/2021     3 PCP: Patient, No Pcp Per (Inactive)  Initial CC: AMS, lethargy  Hospital Course: Emma Maldonado is a 70 yo female with PMH dementia, renal transplant, DMII, HTN who presented with reported worsening mentation.  She was also noted to be hypoglycemic on initial evaluation with EMS.  She was also evaluated by her daughter and also felt to be more confused and lethargic.  After reevaluation she was found to be hypotensive and therefore brought to the ER for further evaluation.  She was recently discharged on 12/30/2020 after being hospitalized for proctitis.  She had also undergone flex sig on 12/28/2020 which revealed severe inflammation in the rectum with out any masses or lesions.  She was also evaluated by infectious disease during her hospitalization due to positive blood cultures which were felt to be contamination.  She had been treated with some antibiotics during hospital course as well. She was discharged with cortenema for her rectal inflammation to continue at discharge.   She was started on broad spectrum abx on admission and admitted for further workup.  Interval History:  No events overnight.  Foley continues to function well.  Clear yellow urine noted in bag.  She had no concerns this morning and the mild left lower abdominal pain she had yesterday is no longer present.  Assessment & Plan: * Hypothermia-resolved as of 01/03/2021, (present on admission) - resolved   Acute urinary retention - CT abdomen/pelvis showed significantly distended bladder.  Patient also has been noted to have what appears to be overflow incontinence reported by staff. Bedside bladder scan on 12/23 showed at least 800 cc in bladder.  Given her debilitated state, dementia, recurrent hospitalization, at risk for ongoing retention, therefore will place indwelling Foley catheter and she will need outpatient follow-up  with urology for TOV - continue foley, placed on 01/03/21 - see sepsis also   Sepsis (HCC) - Hypothermia, tachypnea.  Source unclear on admission but after work-up, appears to be probable UTI  - ongoing rectal inflammation and ongoing B/L LL opacities seen on prior CT 12/12 as well (therefore less likely to be uncontrolled PNA at this time) - given vanc/cefepime/flagyl on admission; negative PCT -She was monitored off of antibiotics after admission - Given her dementia, difficult to know her symptom complaint although she did have significant urinary retention requiring placement of Foley catheter on 01/03/2021 and now urine culture is growing E faecalis; therefore, will initiate treatment to treat for presumed UTI contributing to her clinical presentation on admission -Start on amoxicillin  Acute metabolic encephalopathy-resolved as of 01/03/2021, (present on admission) - possibly from hypoglycemia and hypothermia on admission; unclear if truly precipitated by infection given rapid improvement   Chronic diarrhea - no fever, leukocytosis, or abdominal pain - No indication for checking C. difficile - Followup GI pathogen panel - Typically is on Lomotil outpatient  Dementia without behavioral disturbance (HCC) - mentation is now baseline per daughter; patient not a great historian at baseline   History of renal transplant - Continue prednisone and tacrolimus - No abnormalities with transplanted kidney appreciated on CT abdomen/pelvis    Old records reviewed in assessment of this patient  Antimicrobials: Cefepime 12/22 x 2 doses Vanc 12/22 x 1 dose Flagyl 12/22 x 3 doses Amoxicillin 12/24 >> current   DVT prophylaxis: Lovenox  Code Status:   Code Status: Partial Code  Disposition Plan:   Status  is: Inpt  Objective: Blood pressure (!) 162/69, pulse 74, temperature 98.7 F (37.1 C), temperature source Oral, resp. rate 16, height 5' (1.524 m), weight 70.2 kg, SpO2 99 %.   Examination:  Physical Exam Constitutional:      Appearance: Normal appearance.  HENT:     Head: Normocephalic and atraumatic.     Mouth/Throat:     Mouth: Mucous membranes are moist.  Eyes:     Extraocular Movements: Extraocular movements intact.  Cardiovascular:     Rate and Rhythm: Normal rate and regular rhythm.  Pulmonary:     Effort: Pulmonary effort is normal.     Breath sounds: Normal breath sounds.  Abdominal:     General: Bowel sounds are normal. There is no distension.     Palpations: Abdomen is soft.     Tenderness: There is no abdominal tenderness.  Musculoskeletal:     Cervical back: Normal range of motion and neck supple.  Skin:    General: Skin is warm and dry.  Neurological:     General: No focal deficit present.     Mental Status: She is alert.     Comments: Dementia appreciated   Psychiatric:        Mood and Affect: Mood normal.        Behavior: Behavior normal.     Consultants:    Procedures:    Data Reviewed: I have personally reviewed labs and imaging studies    LOS: 3 days  Time spent: Greater than 50% of the 35 minute visit was spent in counseling/coordination of care for the patient as laid out in the A&P.   Emma Chamber, MD [facility] 01/05/2021, 12:07 PM

## 2021-01-05 NOTE — Evaluation (Signed)
Occupational Therapy Evaluation Patient Details Name: Emma Maldonado MRN: [id number] DOB: November 02, 1950 Today's Date: 01/05/2021   History of Present Illness 70 yo Spanish Speaking female admitted from memory care unit with AMS, UTI, SIRS. Hx of dementia, DM, COVID, renal transplant. Recent d/c 12/19   Clinical Impression   Patient evaluated by Occupational Therapy with no further acute OT needs identified. All education has been completed and the patient has no further questions. Patient is at baseline with SUP/min guard needed for ADL tasks. Patient was able to complete toileting, LB dressing, transfers, grooming and functional mobility in room at PLOF from last admission on this date.  See below for any follow-up Occupational Therapy or equipment needs. OT is signing off. Thank you for this referral.       Recommendations for follow up therapy are one component of a multi-disciplinary discharge planning process, led by the attending physician.  Recommendations may be updated based on patient status, additional functional criteria and insurance authorization.   Follow Up Recommendations  Other (comment) (patient to transition back to memory care unit for LTC)    Assistance Recommended at Discharge Frequent or constant Supervision/Assistance  Functional Status Assessment  Patient has not had a recent decline in their functional status  Equipment Recommendations  None recommended by OT    Recommendations for Other Services       Precautions / Restrictions Precautions Precautions: Fall Precaution Comments: incontinence Restrictions Weight Bearing Restrictions: No      Mobility Bed Mobility               General bed mobility comments: up in recliner at start of session    Transfers Overall transfer level: Needs assistance     Sit to Stand: Min guard     Step pivot transfers: Min guard     General transfer comment: min guard with safety cues provided for all  standing and transfers      Balance Overall balance assessment: Mild deficits observed, not formally tested                                         ADL either performed or assessed with clinical judgement   ADL Overall ADL's : At baseline                                       General ADL Comments: patient is at baseline for ADLs with sup/min guard for ADLs with no AD with LB dressing,grooming tasks, toileting, transfers, functional mobility in room. patient is from memory care. patient plans to transition back to memory care unit at time of d/c. nurse reported patient is back to baseline from previous admission as well.     Vision Patient Visual Report: No change from baseline       Perception     Praxis      Pertinent Vitals/Pain Pain Assessment: No/denies pain     Hand Dominance Right   Extremity/Trunk Assessment Upper Extremity Assessment Upper Extremity Assessment: Overall WFL for tasks assessed   Lower Extremity Assessment Lower Extremity Assessment: Defer to PT evaluation   Cervical / Trunk Assessment Cervical / Trunk Assessment: Normal   Communication Communication Communication: No difficulties   Cognition Arousal/Alertness: Awake/alert Behavior During Therapy: WFL for tasks assessed/performed Overall Cognitive Status: History of cognitive  impairments - at baseline                                       General Comments       Exercises     Shoulder Instructions      Home Living Family/patient expects to be discharged to:: Assisted living     Type of Home: Assisted living Home Access: Level entry     Home Layout: One level                   Additional Comments: Patient unreliable historian. Suspect needs assistance for mobility and ADLs - for safety.      Prior Functioning/Environment               Mobility Comments: pt reports she did not use a device ADLs Comments: was min  guard for ADLs at d/c from last admission        OT Problem List:        OT Treatment/Interventions:      OT Goals(Current goals can be found in the care plan section) Acute Rehab OT Goals OT Goal Formulation: All assessment and education complete, DC therapy  OT Frequency:     Barriers to D/C:            Co-evaluation              AM-PAC OT "6 Clicks" Daily Activity     Outcome Measure Help from another person eating meals?: None Help from another person taking care of personal grooming?: None   Help from another person bathing (including washing, rinsing, drying)?: A Little Help from another person to put on and taking off regular upper body clothing?: A Little Help from another person to put on and taking off regular lower body clothing?: A Little 6 Click Score: 17   End of Session Nurse Communication: Mobility status  Activity Tolerance: Patient tolerated treatment well Patient left: in chair;with call bell/phone within reach;with chair alarm set  OT Visit Diagnosis: Unsteadiness on feet (R26.81)                Time: 0955-1010 OT Time Calculation (min): 15 min Charges:  OT General Charges $OT Visit: 1 Visit OT Evaluation $OT Eval Low Complexity: 1 Low  Viann Shove, MS Acute Rehabilitation Department Office# [phone] Pager# [phone]   Ardyth Harps 01/05/2021, 10:16 AM

## 2021-01-05 NOTE — Assessment & Plan Note (Addendum)
-   no fever, leukocytosis, or abdominal pain - No indication for checking C. difficile - Negative GI pathogen panel - Okay for as needed Imodium

## 2021-01-06 LAB — CBC WITH DIFFERENTIAL/PLATELET
Abs Immature Granulocytes: 0.04 10*3/uL (ref 0.00–0.07)
Basophils Absolute: 0 10*3/uL (ref 0.0–0.1)
Basophils Relative: 1 %
Eosinophils Absolute: 0.1 10*3/uL (ref 0.0–0.5)
Eosinophils Relative: 2 %
HCT: 36.3 % (ref 36.0–46.0)
Hemoglobin: 11.8 g/dL — ABNORMAL LOW (ref 12.0–15.0)
Immature Granulocytes: 1 %
Lymphocytes Relative: 31 %
Lymphs Abs: 1.7 10*3/uL (ref 0.7–4.0)
MCH: 25.4 pg — ABNORMAL LOW (ref 26.0–34.0)
MCHC: 32.5 g/dL (ref 30.0–36.0)
MCV: 78.2 fL — ABNORMAL LOW (ref 80.0–100.0)
Monocytes Absolute: 0.5 10*3/uL (ref 0.1–1.0)
Monocytes Relative: 8 %
Neutro Abs: 3.3 10*3/uL (ref 1.7–7.7)
Neutrophils Relative %: 57 %
Platelets: 299 10*3/uL (ref 150–400)
RBC: 4.64 MIL/uL (ref 3.87–5.11)
RDW: 15.5 % (ref 11.5–15.5)
WBC: 5.7 10*3/uL (ref 4.0–10.5)
nRBC: 0 % (ref 0.0–0.2)

## 2021-01-06 LAB — BASIC METABOLIC PANEL
Anion gap: 5 (ref 5–15)
BUN: 8 mg/dL (ref 8–23)
CO2: 27 mmol/L (ref 22–32)
Calcium: 8.6 mg/dL — ABNORMAL LOW (ref 8.9–10.3)
Chloride: 110 mmol/L (ref 98–111)
Creatinine, Ser: 0.5 mg/dL (ref 0.44–1.00)
GFR, Estimated: >60 mL/min (ref >60)
Glucose, Bld: 113 mg/dL — ABNORMAL HIGH (ref 70–99)
Potassium: 3.7 mmol/L (ref 3.5–5.1)
Sodium: 142 mmol/L (ref 135–145)

## 2021-01-06 LAB — GASTROINTESTINAL PANEL BY PCR, STOOL (REPLACES STOOL CULTURE)

## 2021-01-06 LAB — MAGNESIUM: Magnesium: 1.9 mg/dL (ref 1.7–2.4)

## 2021-01-06 LAB — GLUCOSE, CAPILLARY
Glucose-Capillary: 375 mg/dL — ABNORMAL HIGH (ref 70–99)
Glucose-Capillary: 343 mg/dL — ABNORMAL HIGH (ref 70–99)
Glucose-Capillary: 172 mg/dL — ABNORMAL HIGH (ref 70–99)
Glucose-Capillary: 224 mg/dL — ABNORMAL HIGH (ref 70–99)
Glucose-Capillary: 389 mg/dL — ABNORMAL HIGH (ref 70–99)
Glucose-Capillary: 272 mg/dL — ABNORMAL HIGH (ref 70–99)

## 2021-01-06 MED ORDER — LOPERAMIDE HCL 2 MG PO CAPS
4.0000 mg | ORAL_CAPSULE | ORAL | Status: DC | PRN
  Administered 2021-01-12: 4 mg via ORAL
  Filled 2021-01-06: qty 2

## 2021-01-06 MED ORDER — INSULIN GLARGINE-YFGN 100 UNIT/ML ~~LOC~~ SOLN
10.0000 [IU] | Freq: Every day | SUBCUTANEOUS | Status: DC
  Administered 2021-01-06 – 2021-01-07 (×2): 10 [IU] via SUBCUTANEOUS
  Filled 2021-01-06 (×2): qty 0.1

## 2021-01-06 NOTE — Progress Notes (Signed)
Progress Note    Emma Maldonado   [id number]  DOB: 12/08/1950  DOA: 01/01/2021     4 PCP: Patient, No Pcp Per (Inactive)  Initial CC: AMS, lethargy  Hospital Course: Emma Maldonado is a 70 yo female with PMH dementia, renal transplant, DMII, HTN who presented with reported worsening mentation.  She was also noted to be hypoglycemic on initial evaluation with EMS.  She was also evaluated by her daughter and also felt to be more confused and lethargic.  After reevaluation she was found to be hypotensive and therefore brought to the ER for further evaluation.  She was recently discharged on 12/30/2020 after being hospitalized for proctitis.  She had also undergone flex sig on 12/28/2020 which revealed severe inflammation in the rectum with out any masses or lesions.  She was also evaluated by infectious disease during her hospitalization due to positive blood cultures which were felt to be contamination.  She had been treated with some antibiotics during hospital course as well. She was discharged with cortenema for her rectal inflammation to continue at discharge.   She was started on broad spectrum abx on admission and admitted for further workup.  Interval History:  No events overnight.  Patient seen by physical therapy this morning and ambulated very well.  No needs from a PT standpoint at discharge.  Assessment & Plan: * Hypothermia-resolved as of 01/03/2021, (present on admission) - resolved   Acute urinary retention - CT abdomen/pelvis showed significantly distended bladder.  Patient also has been noted to have what appears to be overflow incontinence reported by staff. Bedside bladder scan on 12/23 showed at least 800 cc in bladder.  Given her debilitated state, dementia, recurrent hospitalization, at risk for ongoing retention, therefore will place indwelling Foley catheter -Weakness has improved and she was ambulating well with physical therapy this morning - Foley has been in  since 01/03/2021 -Given improved functional status, we will pursue trial of void with Foley catheter discontinuation today and bladder scanning.  If she retains again, she will need indwelling Foley catheter again and outpatient follow-up with urology  Sepsis (HCC) - Hypothermia, tachypnea.  Source unclear on admission but after work-up, appears to be probable UTI  - ongoing rectal inflammation and ongoing B/L LL opacities seen on prior CT 12/12 as well (therefore less likely to be uncontrolled PNA at this time) - given vanc/cefepime/flagyl on admission; negative PCT -She was monitored off of antibiotics after admission - Given her dementia, difficult to know her symptom complaint although she did have significant urinary retention requiring placement of Foley catheter on 01/03/2021 and now urine culture is growing E faecalis; therefore, will initiate treatment to treat for presumed UTI contributing to her clinical presentation on admission -continue amoxicillin  Acute metabolic encephalopathy-resolved as of 01/03/2021, (present on admission) - possibly from hypoglycemia and hypothermia on admission; unclear if truly precipitated by infection given rapid improvement   Chronic diarrhea - no fever, leukocytosis, or abdominal pain - No indication for checking C. difficile - Negative GI pathogen panel - Okay for as needed Imodium  Dementia without behavioral disturbance (HCC) - mentation is now baseline per daughter; patient not a great historian at baseline   History of renal transplant - Continue prednisone and tacrolimus - No abnormalities with transplanted kidney appreciated on CT abdomen/pelvis    Old records reviewed in assessment of this patient  Antimicrobials: Cefepime 12/22 x 2 doses Vanc 12/22 x 1 dose Flagyl 12/22 x 3 doses Amoxicillin 12/24 >> current  DVT prophylaxis: Lovenox  Code Status:   Code Status: Partial Code  Disposition Plan: Possible discharge tomorrow  if able to void well after Foley removal Status is: Inpt  Objective: Blood pressure (!) 159/70, pulse 70, temperature 98.4 F (36.9 C), temperature source Oral, resp. rate 16, height 5' (1.524 m), weight 70.2 kg, SpO2 97 %.  Examination:  Physical Exam Constitutional:      Appearance: Normal appearance.  HENT:     Head: Normocephalic and atraumatic.     Mouth/Throat:     Mouth: Mucous membranes are moist.  Eyes:     Extraocular Movements: Extraocular movements intact.  Cardiovascular:     Rate and Rhythm: Normal rate and regular rhythm.  Pulmonary:     Effort: Pulmonary effort is normal.     Breath sounds: Normal breath sounds.  Abdominal:     General: Bowel sounds are normal. There is no distension.     Palpations: Abdomen is soft.     Tenderness: There is no abdominal tenderness.  Musculoskeletal:     Cervical back: Normal range of motion and neck supple.  Skin:    General: Skin is warm and dry.  Neurological:     General: No focal deficit present.     Mental Status: She is alert.     Comments: Dementia appreciated   Psychiatric:        Mood and Affect: Mood normal.        Behavior: Behavior normal.     Consultants:    Procedures:    Data Reviewed: I have personally reviewed labs and imaging studies    LOS: 4 days  Time spent: Greater than 50% of the 35 minute visit was spent in counseling/coordination of care for the patient as laid out in the A&P.   Lewie Chamber, MD [facility] 01/06/2021, 3:18 PM

## 2021-01-06 NOTE — Progress Notes (Signed)
Physical Therapy Treatment Patient Details Name: Emma Maldonado MRN: [id number] DOB: [birth year] Today's Date: 01/06/2021   History of Present Illness 70 yo Spanish Speaking female admitted from memory care unit with AMS, UTI, SIRS. Hx of dementia, DM, COVID, renal transplant. Recent d/c 12/19    PT Comments    Patient received in recliner, pleasant and cooperative but not very talkative; able to follow all commands well. No incontinence noted today, able to walk in the hallway which she tolerated well. Requested to go back to bed at EOS, did need light MinA for LE management to get back in bed. Left positioned to comfort with all needs met, bed alarm active. Will continue to follow.    Recommendations for follow up therapy are one component of a multi-disciplinary discharge planning process, led by the attending physician.  Recommendations may be updated based on patient status, additional functional criteria and insurance authorization.  Follow Up Recommendations  No PT follow up     Assistance Recommended at Discharge Frequent or constant Supervision/Assistance  Equipment Recommendations  None recommended by PT    Recommendations for Other Services       Precautions / Restrictions Precautions Precautions: Fall Precaution Comments: incontinence Restrictions Weight Bearing Restrictions: No     Mobility  Bed Mobility Overal bed mobility: Needs Assistance Bed Mobility: Sit to Supine       Sit to supine: Min assist   General bed mobility comments: very light MinA for LE management    Transfers Overall transfer level: Needs assistance Equipment used: Rolling walker (2 wheels) Transfers: Sit to/from Stand Sit to Stand: Min guard           General transfer comment: min guard, cues for hand placement    Ambulation/Gait Ambulation/Gait assistance: Supervision Gait Distance (Feet): 150 Feet Assistive device: Rolling walker (2 wheels) Gait Pattern/deviations:  Step-through pattern;Decreased stride length;Trunk flexed Gait velocity: decreased     General Gait Details: slow and steady with RW, able to go in the hall today   Stairs             Wheelchair Mobility    Modified Rankin (Stroke Patients Only)       Balance Overall balance assessment: Mild deficits observed, not formally tested         Standing balance support: Bilateral upper extremity supported;Reliant on assistive device for balance Standing balance-Leahy Scale: Fair                              Cognition Arousal/Alertness: Awake/alert Behavior During Therapy: WFL for tasks assessed/performed Overall Cognitive Status: History of cognitive impairments - at baseline                                 General Comments: cooperative and often with 1-2 word answers during session, followed verbal and non-verbal commands well        Exercises      General Comments        Pertinent Vitals/Pain Pain Assessment: Faces Faces Pain Scale: No hurt Pain Intervention(s): Monitored during session;Limited activity within patient's tolerance    Home Living                          Prior Function            PT Goals (current goals can now be found  in the care plan section) Acute Rehab PT Goals Patient Stated Goal: none stated PT Goal Formulation: Patient unable to participate in goal setting Time For Goal Achievement: 01/17/21 Potential to Achieve Goals: Fair Progress towards PT goals: Progressing toward goals    Frequency    Min 3X/week      PT Plan Current plan remains appropriate    Co-evaluation              AM-PAC PT "6 Clicks" Mobility   Outcome Measure  Help needed turning from your back to your side while in a flat bed without using bedrails?: A Little Help needed moving from lying on your back to sitting on the side of a flat bed without using bedrails?: A Little Help needed moving to and from a bed  to a chair (including a wheelchair)?: A Little Help needed standing up from a chair using your arms (e.g., wheelchair or bedside chair)?: A Little Help needed to walk in hospital room?: A Little Help needed climbing 3-5 steps with a railing? : A Little 6 Click Score: 18    End of Session   Activity Tolerance: Patient tolerated treatment well Patient left: in bed;with call bell/phone within reach;with bed alarm set Nurse Communication: Mobility status PT Visit Diagnosis: Muscle weakness (generalized) (M62.81);Difficulty in walking, not elsewhere classified (R26.2)     Time: 1045-1100 PT Time Calculation (min) (ACUTE ONLY): 15 min  Charges:  $Gait Training: 8-22 mins                    Windell Norfolk, DPT, PN2   Supplemental Physical Therapist [place]    Pager [phone] Acute Rehab Office [phone]

## 2021-01-06 NOTE — Plan of Care (Signed)
  Problem: Clinical Measurements: Goal: Respiratory complications will improve Outcome: Progressing Goal: Cardiovascular complication will be avoided Outcome: Progressing   Problem: Nutrition: Goal: Adequate nutrition will be maintained Outcome: Progressing   

## 2021-01-07 LAB — GLUCOSE, CAPILLARY
Glucose-Capillary: 223 mg/dL — ABNORMAL HIGH (ref 70–99)
Glucose-Capillary: 328 mg/dL — ABNORMAL HIGH (ref 70–99)
Glucose-Capillary: 325 mg/dL — ABNORMAL HIGH (ref 70–99)
Glucose-Capillary: 250 mg/dL — ABNORMAL HIGH (ref 70–99)

## 2021-01-07 LAB — CBC WITH DIFFERENTIAL/PLATELET
Abs Immature Granulocytes: 0.06 10*3/uL (ref 0.00–0.07)
Basophils Absolute: 0.1 10*3/uL (ref 0.0–0.1)
Basophils Relative: 1 %
Eosinophils Absolute: 0.2 10*3/uL (ref 0.0–0.5)
Eosinophils Relative: 3 %
HCT: 37.3 % (ref 36.0–46.0)
Hemoglobin: 12 g/dL (ref 12.0–15.0)
Immature Granulocytes: 1 %
Lymphocytes Relative: 30 %
Lymphs Abs: 1.9 10*3/uL (ref 0.7–4.0)
MCH: 25.4 pg — ABNORMAL LOW (ref 26.0–34.0)
MCHC: 32.2 g/dL (ref 30.0–36.0)
MCV: 79 fL — ABNORMAL LOW (ref 80.0–100.0)
Monocytes Absolute: 0.5 10*3/uL (ref 0.1–1.0)
Monocytes Relative: 8 %
Neutro Abs: 3.5 10*3/uL (ref 1.7–7.7)
Neutrophils Relative %: 57 %
Platelets: 268 10*3/uL (ref 150–400)
RBC: 4.72 MIL/uL (ref 3.87–5.11)
RDW: 15.8 % — ABNORMAL HIGH (ref 11.5–15.5)
WBC: 6.2 10*3/uL (ref 4.0–10.5)
nRBC: 0 % (ref 0.0–0.2)

## 2021-01-07 LAB — BASIC METABOLIC PANEL
Anion gap: 5 (ref 5–15)
BUN: 14 mg/dL (ref 8–23)
CO2: 26 mmol/L (ref 22–32)
Calcium: 9.2 mg/dL (ref 8.9–10.3)
Chloride: 109 mmol/L (ref 98–111)
Creatinine, Ser: 0.52 mg/dL (ref 0.44–1.00)
GFR, Estimated: >60 mL/min (ref >60)
Glucose, Bld: 213 mg/dL — ABNORMAL HIGH (ref 70–99)
Potassium: 3.8 mmol/L (ref 3.5–5.1)
Sodium: 140 mmol/L (ref 135–145)

## 2021-01-07 LAB — CULTURE, BLOOD (ROUTINE X 2): Culture: NO GROWTH

## 2021-01-07 LAB — MAGNESIUM: Magnesium: 1.9 mg/dL (ref 1.7–2.4)

## 2021-01-07 MED ORDER — INSULIN GLARGINE-YFGN 100 UNIT/ML ~~LOC~~ SOLN
5.0000 [IU] | Freq: Once | SUBCUTANEOUS | Status: AC
  Administered 2021-01-07: 12:00:00 5 [IU] via SUBCUTANEOUS
  Filled 2021-01-07: qty 0.05

## 2021-01-07 MED ORDER — INSULIN GLARGINE-YFGN 100 UNIT/ML ~~LOC~~ SOLN
15.0000 [IU] | Freq: Every day | SUBCUTANEOUS | Status: DC
Start: 2021-01-08 — End: 2021-01-09
  Administered 2021-01-08: 10:00:00 15 [IU] via SUBCUTANEOUS
  Filled 2021-01-07 (×2): qty 0.15

## 2021-01-07 NOTE — Progress Notes (Signed)
Physical Therapy Treatment Patient Details Name: Emma Maldonado MRN: [id number] DOB: 08/20/1950 Today's Date: 01/07/2021   History of Present Illness 70 yo Spanish Speaking female admitted from memory care unit with AMS, UTI, SIRS. Hx of dementia, DM, COVID, renal transplant. Recent d/c 12/19    PT Comments    Pt agreeable to ambulate. Daughter arrived end of session. Pt tolerated distance well.    Recommendations for follow up therapy are one component of a multi-disciplinary discharge planning process, led by the attending physician.  Recommendations may be updated based on patient status, additional functional criteria and insurance authorization.  Follow Up Recommendations  No PT follow up     Assistance Recommended at Discharge Frequent or constant Supervision/Assistance  Equipment Recommendations  None recommended by PT    Recommendations for Other Services       Precautions / Restrictions Precautions Precautions: Fall Precaution Comments: incontinence Restrictions Weight Bearing Restrictions: No     Mobility  Bed Mobility               General bed mobility comments: oob in recliner    Transfers Overall transfer level: Needs assistance Equipment used: Rolling walker (2 wheels) Transfers: Sit to/from Stand Sit to Stand: Min guard           General transfer comment: min guard, cues for hand placement    Ambulation/Gait Ambulation/Gait assistance: Min assist Gait Distance (Feet): 200 Feet Assistive device: Rolling walker (2 wheels) Gait Pattern/deviations: Step-through pattern;Decreased stride length       General Gait Details: Assist to steer RW. Bumped into objects in the environment several times   Stairs             Wheelchair Mobility    Modified Rankin (Stroke Patients Only)       Balance Overall balance assessment: Needs assistance         Standing balance support: Bilateral upper extremity supported;Reliant on  assistive device for balance Standing balance-Leahy Scale: Fair                              Cognition Arousal/Alertness: Awake/alert Behavior During Therapy: WFL for tasks assessed/performed Overall Cognitive Status: History of cognitive impairments - at baseline                                 General Comments: cooperative and often with 1-2 word answers during session, followed verbal and non-verbal commands well        Exercises      General Comments        Pertinent Vitals/Pain Pain Assessment: No/denies pain    Home Living                          Prior Function            PT Goals (current goals can now be found in the care plan section) Progress towards PT goals: Progressing toward goals    Frequency    Min 3X/week      PT Plan Current plan remains appropriate    Co-evaluation              AM-PAC PT "6 Clicks" Mobility   Outcome Measure  Help needed turning from your back to your side while in a flat bed without using bedrails?: A Little Help needed moving from lying  on your back to sitting on the side of a flat bed without using bedrails?: A Little Help needed moving to and from a bed to a chair (including a wheelchair)?: A Little Help needed standing up from a chair using your arms (e.g., wheelchair or bedside chair)?: A Little Help needed to walk in hospital room?: A Little Help needed climbing 3-5 steps with a railing? : A Little 6 Click Score: 18    End of Session Equipment Utilized During Treatment: Gait belt Activity Tolerance: Patient tolerated treatment well Patient left: in chair;with call bell/phone within reach;with family/visitor present   PT Visit Diagnosis: Muscle weakness (generalized) (M62.81);Difficulty in walking, not elsewhere classified (R26.2)     Time: 6803-2122 PT Time Calculation (min) (ACUTE ONLY): 22 min  Charges:  $Gait Training: 8-22 mins                          Faye Ramsay, PT Acute Rehabilitation  Office: [phone] Pager: [phone]

## 2021-01-07 NOTE — TOC Progression Note (Addendum)
Transition of Care [facility]) - Progression Note    Patient Details  Name: Emma Maldonado MRN: [id number] Date of Birth: 09/06/1950  Transition of Care [facility]) CM/SW Contact  Darleene Cleaver, [place] Phone Number: 01/07/2021, 12:21 PM  Clinical Narrative:     CSW spoke to [place] at [facility], and they said patient can return once medically ready, but she can not have a catheter.  Per bedside nurse patient was still retaining some urine.  Physician and bedside nurse are discussing if patient is ready for discharge or not.  CSW to call [facility] back and update on physician's decision.  4:00pm  CSW attempted to contact Katie at [facility], CSW had to leave a message awaiting for a call back.   Expected Discharge Plan: Assisted Living Barriers to Discharge: Continued Medical Work up  Expected Discharge Plan and Services Expected Discharge Plan: Assisted Living   Discharge Planning Services: CM Consult   Living arrangements for the past 2 months: Assisted Living Facility                                       Social Determinants of Health (SDOH) Interventions    Readmission Risk Interventions Readmission Risk Prevention Plan 01/03/2021  Transportation Screening Complete  HRI or Home Care Consult Complete  Social Work Consult for Recovery Care Planning/Counseling Complete  Palliative Care Screening Not Applicable  Medication Review Oceanographer) Complete  Some recent data might be hidden

## 2021-01-07 NOTE — Progress Notes (Signed)
Progress Note    Analisse Randle   [id number]  DOB: August 08, 1950  DOA: 01/01/2021     5 PCP: Patient, No Pcp Per (Inactive)  Initial CC: AMS, lethargy  Hospital Course: Ms. Rada is a 70 yo female with PMH dementia, renal transplant, DMII, HTN who presented with reported worsening mentation.  She was also noted to be hypoglycemic on initial evaluation with EMS.  She was also evaluated by her daughter and also felt to be more confused and lethargic.  After reevaluation she was found to be hypotensive and therefore brought to the ER for further evaluation.  She was recently discharged on 12/30/2020 after being hospitalized for proctitis.  She had also undergone flex sig on 12/28/2020 which revealed severe inflammation in the rectum with out any masses or lesions.  She was also evaluated by infectious disease during her hospitalization due to positive blood cultures which were felt to be contamination.  She had been treated with some antibiotics during hospital course as well. She was discharged with cortenema for her rectal inflammation to continue at discharge.   She was started on broad spectrum abx on admission and admitted for further workup.  Interval History:  Foley removed late yesterday and she did have some retention requiring straight cath this morning.  We will keep her in the hospital a little longer while we continue bladder scanning and monitoring her retention. Called and discussed this with her daughter as well.  Assessment & Plan: * Hypothermia-resolved as of 01/03/2021, (present on admission) - resolved   Acute urinary retention - CT abdomen/pelvis showed significantly distended bladder.  Patient also has been noted to have what appears to be overflow incontinence reported by staff. Bedside bladder scan on 12/23 showed at least 800 cc in bladder.  Given her debilitated state, dementia, recurrent hospitalization, at risk for ongoing retention, therefore will place  indwelling Foley catheter -Weakness has improved and she was ambulating well with physical therapy this morning - Foley has been in since 01/03/2021 - foley removed on 12/26; she required I&O around 5 am this morning; she appears to void after I&O then retains. She cannot return to [facility] with a foley, so we will keep her 1 more day in hospital while we continue bladder scans and monitor her retention, then decide next steps thereafter - I have called her daughter and informed her of this as well  Sepsis (HCC) - Hypothermia, tachypnea.  Source unclear on admission but after work-up, appears to be probable UTI  - ongoing rectal inflammation and ongoing B/L LL opacities seen on prior CT 12/12 as well (therefore less likely to be uncontrolled PNA at this time) - given vanc/cefepime/flagyl on admission; negative PCT -She was monitored off of antibiotics after admission - Given her dementia, difficult to know her symptom complaint although she did have significant urinary retention requiring placement of Foley catheter on 01/03/2021 and now urine culture is growing E faecalis; therefore, will initiate treatment to treat for presumed UTI contributing to her clinical presentation on admission -continue amoxicillin  Acute metabolic encephalopathy-resolved as of 01/03/2021, (present on admission) - possibly from hypoglycemia and hypothermia on admission; unclear if truly precipitated by infection given rapid improvement   Chronic diarrhea - no fever, leukocytosis, or abdominal pain - No indication for checking C. difficile - Negative GI pathogen panel - Okay for as needed Imodium  Dementia without behavioral disturbance (HCC) - mentation is now baseline per daughter; patient not a great historian at baseline  History of renal transplant - Continue prednisone and tacrolimus - No abnormalities with transplanted kidney appreciated on CT abdomen/pelvis    Old records reviewed in  assessment of this patient  Antimicrobials: Cefepime 12/22 x 2 doses Vanc 12/22 x 1 dose Flagyl 12/22 x 3 doses Amoxicillin 12/24 >> current   DVT prophylaxis: Lovenox  Code Status:   Code Status: Partial Code  Disposition Plan: Back to hopefully [facility] in 1 to 2 days Status is: Inpt  Objective: Blood pressure (!) 173/68, pulse 63, temperature 98.1 F (36.7 C), temperature source Axillary, resp. rate 15, height 5' (1.524 m), weight 70.2 kg, SpO2 93 %.  Examination:  Physical Exam Constitutional:      Appearance: Normal appearance.  HENT:     Head: Normocephalic and atraumatic.     Mouth/Throat:     Mouth: Mucous membranes are moist.  Eyes:     Extraocular Movements: Extraocular movements intact.  Cardiovascular:     Rate and Rhythm: Normal rate and regular rhythm.  Pulmonary:     Effort: Pulmonary effort is normal.     Breath sounds: Normal breath sounds.  Abdominal:     General: Bowel sounds are normal. There is no distension.     Palpations: Abdomen is soft.     Tenderness: There is no abdominal tenderness.  Musculoskeletal:     Cervical back: Normal range of motion and neck supple.  Skin:    General: Skin is warm and dry.  Neurological:     General: No focal deficit present.     Mental Status: She is alert.     Comments: Dementia appreciated   Psychiatric:        Mood and Affect: Mood normal.        Behavior: Behavior normal.     Consultants:    Procedures:    Data Reviewed: I have personally reviewed labs and imaging studies    LOS: 5 days  Time spent: Greater than 50% of the 35 minute visit was spent in counseling/coordination of care for the patient as laid out in the A&P.   Lewie Chamber, MD [facility] 01/07/2021, 1:03 PM

## 2021-01-07 NOTE — Care Management Important Message (Signed)
Important Message  Patient Details IM Letter placed in Patients room. Name: Emma Maldonado MRN: [id number] Date of Birth: 07-22-1950   Medicare Important Message Given:  Yes     [name] 01/07/2021, 11:46 AM

## 2021-01-08 LAB — GLUCOSE, CAPILLARY
Glucose-Capillary: 274 mg/dL — ABNORMAL HIGH (ref 70–99)
Glucose-Capillary: 304 mg/dL — ABNORMAL HIGH (ref 70–99)
Glucose-Capillary: 240 mg/dL — ABNORMAL HIGH (ref 70–99)
Glucose-Capillary: 279 mg/dL — ABNORMAL HIGH (ref 70–99)

## 2021-01-08 LAB — CBC WITH DIFFERENTIAL/PLATELET
Abs Immature Granulocytes: 0.07 10*3/uL (ref 0.00–0.07)
Basophils Absolute: 0 10*3/uL (ref 0.0–0.1)
Basophils Relative: 1 %
Eosinophils Absolute: 0.1 10*3/uL (ref 0.0–0.5)
Eosinophils Relative: 2 %
HCT: 35.6 % — ABNORMAL LOW (ref 36.0–46.0)
Hemoglobin: 11.9 g/dL — ABNORMAL LOW (ref 12.0–15.0)
Immature Granulocytes: 1 %
Lymphocytes Relative: 32 %
Lymphs Abs: 1.8 10*3/uL (ref 0.7–4.0)
MCH: 26 pg (ref 26.0–34.0)
MCHC: 33.4 g/dL (ref 30.0–36.0)
MCV: 77.9 fL — ABNORMAL LOW (ref 80.0–100.0)
Monocytes Absolute: 0.4 10*3/uL (ref 0.1–1.0)
Monocytes Relative: 8 %
Neutro Abs: 3.3 10*3/uL (ref 1.7–7.7)
Neutrophils Relative %: 56 %
Platelets: 245 10*3/uL (ref 150–400)
RBC: 4.57 MIL/uL (ref 3.87–5.11)
RDW: 15.9 % — ABNORMAL HIGH (ref 11.5–15.5)
WBC: 5.8 10*3/uL (ref 4.0–10.5)
nRBC: 0 % (ref 0.0–0.2)

## 2021-01-08 LAB — BASIC METABOLIC PANEL
Anion gap: 8 (ref 5–15)
BUN: 12 mg/dL (ref 8–23)
CO2: 23 mmol/L (ref 22–32)
Calcium: 9.6 mg/dL (ref 8.9–10.3)
Chloride: 105 mmol/L (ref 98–111)
Creatinine, Ser: 0.49 mg/dL (ref 0.44–1.00)
GFR, Estimated: >60 mL/min (ref >60)
Glucose, Bld: 264 mg/dL — ABNORMAL HIGH (ref 70–99)
Potassium: 3.1 mmol/L — ABNORMAL LOW (ref 3.5–5.1)
Sodium: 136 mmol/L (ref 135–145)

## 2021-01-08 LAB — MAGNESIUM: Magnesium: 1.5 mg/dL — ABNORMAL LOW (ref 1.7–2.4)

## 2021-01-08 MED ORDER — POTASSIUM CHLORIDE CRYS ER 20 MEQ PO TBCR
40.0000 meq | EXTENDED_RELEASE_TABLET | ORAL | Status: AC
  Administered 2021-01-08 (×2): 40 meq via ORAL
  Filled 2021-01-08 (×2): qty 2

## 2021-01-08 MED ORDER — CHLORHEXIDINE GLUCONATE CLOTH 2 % EX PADS
6.0000 | MEDICATED_PAD | Freq: Every day | CUTANEOUS | Status: DC
  Administered 2021-01-08 – 2021-01-13 (×6): 6 via TOPICAL

## 2021-01-08 MED ORDER — MAGNESIUM SULFATE 2 GM/50ML IV SOLN
2.0000 g | Freq: Once | INTRAVENOUS | Status: AC
  Administered 2021-01-08: 10:00:00 2 g via INTRAVENOUS
  Filled 2021-01-08: qty 50

## 2021-01-08 NOTE — Progress Notes (Signed)
Progress Note    Emma Maldonado   [id number]  DOB: 05-11-1950  DOA: 01/01/2021     6 PCP: Patient, No Pcp Per (Inactive)  Initial CC: AMS, lethargy  Hospital Course: [name] is a 70 yo female with PMH dementia, renal transplant, DMII, HTN who presented with reported worsening mentation.  She was also noted to be hypoglycemic on initial evaluation with EMS.  She was also evaluated by her daughter and also felt to be more confused and lethargic.  After reevaluation she was found to be hypotensive and therefore brought to the ER for further evaluation.  She was recently discharged on 12/30/2020 after being hospitalized for proctitis.  She had also undergone flex sig on 12/28/2020 which revealed severe inflammation in the rectum with out any masses or lesions.  She was also evaluated by infectious disease during her hospitalization due to positive blood cultures which were felt to be contamination.  She had been treated with some antibiotics during hospital course as well. She was discharged with cortenema for her rectal inflammation to continue at discharge.   She was started on broad spectrum abx on admission and admitted for further workup.  Interval History:  No events overnight.  Unfortunately still retaining urine since catheter removal and has required repeat straight caths.  Indwelling Foley catheter to be placed back again today.  I have called her urology office to schedule follow-up outpatient and will inform her daughter as well.  Assessment & Plan: * Hypothermia-resolved as of 01/03/2021, (present on admission) - resolved   Acute urinary retention - CT abdomen/pelvis showed significantly distended bladder.  Patient also has been noted to have what appears to be overflow incontinence reported by staff. Bedside bladder scan on 12/23 showed at least 800 cc in bladder.  Given her debilitated state, dementia, recurrent hospitalization, at risk for ongoing retention,  therefore will place indwelling Foley catheter -Weakness has improved and she was ambulating well with physical therapy this morning - Foley has been in since 01/03/2021 - foley removed on 12/26; she required I&O multiple times for 24 hours so unfortunately has failed TOV.  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  Therefore, catheter will be replaced on 01/08/2021.  I have contacted her urology office at [facility] the soonest appointment is March which she is scheduled for but they will also call for any cancellations prior to then -She may not be able to go back to [facility] with indwelling Foley, therefore alternative facility may need to also be pursued.  Will be discussed at progression rounds  Sepsis (HCC) - Hypothermia, tachypnea.  Source unclear on admission but after work-up, appears to be probable UTI  - ongoing rectal inflammation and ongoing B/L LL opacities seen on prior CT 12/12 as well (therefore less likely to be uncontrolled PNA at this time) - given vanc/cefepime/flagyl on admission; negative PCT -She was monitored off of antibiotics after admission - Given her dementia, difficult to know her symptom complaint although she did have significant urinary retention requiring placement of Foley catheter on 01/03/2021 and now urine culture is growing E faecalis; therefore, will initiate treatment to treat for presumed UTI contributing to her clinical presentation on admission -continue amoxicillin  Acute metabolic encephalopathy-resolved as of 01/03/2021, (present on admission) - possibly from hypoglycemia and hypothermia on admission; unclear if truly precipitated by infection given rapid improvement   Chronic  diarrhea - no fever, leukocytosis, or abdominal pain - No indication for checking C. difficile - Negative GI pathogen panel - Okay for as  needed Imodium  Dementia without behavioral disturbance (HCC) - mentation is now baseline per daughter; patient not a great historian at baseline   History of renal transplant - Continue prednisone and tacrolimus - No abnormalities with transplanted kidney appreciated on CT abdomen/pelvis -Follows [facility] with [facility], recently seen October 2022    Old records reviewed in assessment of this patient  Antimicrobials: Cefepime 12/22 x 2 doses Vanc 12/22 x 1 dose Flagyl 12/22 x 3 doses Amoxicillin 12/24 >> current   DVT prophylaxis: Lovenox  Code Status:   Code Status: Partial Code  Disposition Plan: Back to hopefully [facility] in 1 to 2 days Status is: Inpt  Objective: Blood pressure 136/65, pulse 71, temperature 98.3 F (36.8 C), temperature source Oral, resp. rate 18, height 5' (1.524 m), weight 70.2 kg, SpO2 97 %.  Examination:  Physical Exam Constitutional:      Appearance: Normal appearance.  HENT:     Head: Normocephalic and atraumatic.     Mouth/Throat:     Mouth: Mucous membranes are moist.  Eyes:     Extraocular Movements: Extraocular movements intact.  Cardiovascular:     Rate and Rhythm: Normal rate and regular rhythm.  Pulmonary:     Effort: Pulmonary effort is normal.     Breath sounds: Normal breath sounds.  Abdominal:     General: Bowel sounds are normal. There is no distension.     Palpations: Abdomen is soft.     Tenderness: There is no abdominal tenderness.  Musculoskeletal:     Cervical back: Normal range of motion and neck supple.  Skin:    General: Skin is warm and dry.  Neurological:     General: No focal deficit present.     Mental Status: She is alert.     Comments: Dementia appreciated   Psychiatric:        Mood and Affect: Mood normal.        Behavior: Behavior normal.     Consultants:    Procedures:    Data Reviewed: I have personally reviewed labs and imaging studies    LOS: 6 days  Time spent: Greater  than 50% of the 35 minute visit was spent in counseling/coordination of care for the patient as laid out in the A&P.   Lewie Chamber, MD [facility] 01/08/2021, 11:05 AM

## 2021-01-08 NOTE — Plan of Care (Signed)
  Problem: Health Behavior/Discharge Planning: Goal: Ability to manage health-related needs will improve Outcome: Progressing   Problem: Clinical Measurements: Goal: Will remain free from infection Outcome: Progressing   Problem: Nutrition: Goal: Adequate nutrition will be maintained Outcome: Progressing   Problem: Coping: Goal: Level of anxiety will decrease Outcome: Progressing   Problem: Elimination: Goal: Will not experience complications related to bowel motility Outcome: Progressing   Problem: Safety: Goal: Ability to remain free from injury will improve Outcome: Progressing   Problem: Skin Integrity: Goal: Risk for impaired skin integrity will decrease Outcome: Progressing   

## 2021-01-08 NOTE — Plan of Care (Signed)
  Problem: Activity: Goal: Risk for activity intolerance will decrease Outcome: Progressing   Problem: Coping: Goal: Level of anxiety will decrease Outcome: Progressing   Problem: Safety: Goal: Ability to remain free from injury will improve Outcome: Progressing   

## 2021-01-09 DIAGNOSIS — Z940 Kidney transplant status: Secondary | ICD-10-CM

## 2021-01-09 DIAGNOSIS — I951 Orthostatic hypotension: Secondary | ICD-10-CM

## 2021-01-09 DIAGNOSIS — N390 Urinary tract infection, site not specified: Secondary | ICD-10-CM

## 2021-01-09 DIAGNOSIS — I10 Essential (primary) hypertension: Secondary | ICD-10-CM

## 2021-01-09 DIAGNOSIS — K529 Noninfective gastroenteritis and colitis, unspecified: Secondary | ICD-10-CM

## 2021-01-09 LAB — GLUCOSE, CAPILLARY
Glucose-Capillary: 313 mg/dL — ABNORMAL HIGH (ref 70–99)
Glucose-Capillary: 329 mg/dL — ABNORMAL HIGH (ref 70–99)
Glucose-Capillary: 202 mg/dL — ABNORMAL HIGH (ref 70–99)
Glucose-Capillary: 238 mg/dL — ABNORMAL HIGH (ref 70–99)

## 2021-01-09 LAB — CBC WITH DIFFERENTIAL/PLATELET
Abs Immature Granulocytes: 0.06 10*3/uL (ref 0.00–0.07)
Basophils Absolute: 0 10*3/uL (ref 0.0–0.1)
Basophils Relative: 1 %
Eosinophils Absolute: 0.1 10*3/uL (ref 0.0–0.5)
Eosinophils Relative: 1 %
HCT: 38 % (ref 36.0–46.0)
Hemoglobin: 12.4 g/dL (ref 12.0–15.0)
Immature Granulocytes: 1 %
Lymphocytes Relative: 28 %
Lymphs Abs: 1.9 10*3/uL (ref 0.7–4.0)
MCH: 25.9 pg — ABNORMAL LOW (ref 26.0–34.0)
MCHC: 32.6 g/dL (ref 30.0–36.0)
MCV: 79.3 fL — ABNORMAL LOW (ref 80.0–100.0)
Monocytes Absolute: 0.5 10*3/uL (ref 0.1–1.0)
Monocytes Relative: 8 %
Neutro Abs: 4.4 10*3/uL (ref 1.7–7.7)
Neutrophils Relative %: 61 %
Platelets: 244 10*3/uL (ref 150–400)
RBC: 4.79 MIL/uL (ref 3.87–5.11)
RDW: 15.9 % — ABNORMAL HIGH (ref 11.5–15.5)
WBC: 7 10*3/uL (ref 4.0–10.5)
nRBC: 0 % (ref 0.0–0.2)

## 2021-01-09 LAB — BASIC METABOLIC PANEL
Anion gap: 8 (ref 5–15)
BUN: 15 mg/dL (ref 8–23)
CO2: 28 mmol/L (ref 22–32)
Calcium: 10.2 mg/dL (ref 8.9–10.3)
Chloride: 105 mmol/L (ref 98–111)
Creatinine, Ser: 0.54 mg/dL (ref 0.44–1.00)
GFR, Estimated: >60 mL/min (ref >60)
Glucose, Bld: 293 mg/dL — ABNORMAL HIGH (ref 70–99)
Potassium: 4.2 mmol/L (ref 3.5–5.1)
Sodium: 141 mmol/L (ref 135–145)

## 2021-01-09 LAB — MAGNESIUM: Magnesium: 2 mg/dL (ref 1.7–2.4)

## 2021-01-09 MED ORDER — LOSARTAN POTASSIUM 25 MG PO TABS
25.0000 mg | ORAL_TABLET | Freq: Every day | ORAL | Status: DC
  Administered 2021-01-09 – 2021-01-14 (×6): 25 mg via ORAL
  Filled 2021-01-09 (×6): qty 1

## 2021-01-09 MED ORDER — INSULIN GLARGINE-YFGN 100 UNIT/ML ~~LOC~~ SOLN
18.0000 [IU] | Freq: Every day | SUBCUTANEOUS | Status: DC
Start: 2021-01-09 — End: 2021-01-10
  Administered 2021-01-09: 11:00:00 18 [IU] via SUBCUTANEOUS
  Filled 2021-01-09 (×2): qty 0.18

## 2021-01-09 NOTE — Progress Notes (Signed)
PROGRESS NOTE    Emma Maldonado  ZOX:[id number] DOB: 1950/05/17 DOA: 01/01/2021 PCP: Patient, No Pcp Per (Inactive)   Chief Complaint  Patient presents with   Hypotension    Brief Narrative:  Emma Maldonado is a 70 yo female with PMH dementia, renal transplant, DMII, HTN who presented with reported worsening mentation.  She was also noted to be hypoglycemic on initial evaluation with EMS.  She was also evaluated by her daughter and also felt to be more confused and lethargic.  After reevaluation she was found to be hypotensive and therefore brought to the ER for further evaluation.   She was recently discharged on 12/30/2020 after being hospitalized for proctitis.  She had also undergone flex sig on 12/28/2020 which revealed severe inflammation in the rectum with out any masses or lesions.  She was also evaluated by infectious disease during her hospitalization due to positive blood cultures which were felt to be contamination.  She had been treated with some antibiotics during hospital course as well. She was discharged with cortenema for her rectal inflammation to continue at discharge.    She was started on broad spectrum abx on admission and admitted for further workup.     Assessment & Plan:   Active Problems:   History of renal transplant   Dementia without behavioral disturbance (HCC)   Pressure injury of skin   Sepsis (HCC)   Acute urinary retention   Chronic diarrhea  #1 sepsis, POA -Patient noted to have presented with hypothermia tachypnea, work-up with urinalysis consistent with UTI. -Patient also with ongoing recent rectal inflammation with bilateral ongoing lower lobe opacity seen on prior CT 12/12. -On initial presentation patient received IV Vanco/cefepime/Flagyl with a negative procalcitonin. -IV antibiotics discontinued on admission. -Due to baseline dementia difficult to determine symptom complaints as patient did have significant urinary retention requiring  placement of Foley catheter 01/03/2021 with urine cultures positive for E faecalis. -Continue amoxicillin.  2.  Acute urinary retention -Noted on CT abdomen pelvis with significant bladder distention. -Patient also noted to have some overflow incontinence noted per staff with bedside bladder scan on 01/03/2021 with at least 800 cc of urine in the bladder. -Due to patient's debility, dementia, recurrent hospitalization risk for ongoing retention Foley cath replaced. -Improvement with generalized weakness as patient noted to be ambulating with physical therapy. -Foley catheter placed 01/03/2021. -Foley catheter initially removed 01/06/2021 but due to multiple I and O requirements for 24 hours failed voiding trial. -Patient noted to have severely distended bladder on CT on admission with description baseline for overflow incontinence and as such likely with chronic retention and Foley catheter placed back on 01/08/2021. -Dr. Frederick Peers has contacted patient's urology office at [facility] and closest appointment is March which patient is scheduled for but patient will be called if any cancellations prior to that. -Patient will need to be discharged with Foley catheter and may not be able to go back to give for help with indwelling Foley catheter and as such another facility may need to be pursued.  TOC following.  3.  Acute metabolic encephalopathy -Resolved. -Likely secondary to acute infection.  4.  Chronic diarrhea -Patient afebrile.  Normal white count.  No abdominal pain. -GI pathogen panel negative. -Imodium as needed.  5.  Dementia without behavioral disturbance -Mentation currently close to baseline.  6.  History of renal transplant -No abnormalities noted of transplanted cleanly on CT abdomen and pelvis. -Continue prednisone, tacrolimus. -Outpatient follow-up.  7.  History of orthostatic  hypotension -Continue Florinef.  8.  Hypertension -Resume home regimen  Cozaar.   DVT prophylaxis: Lovenox Code Status: Partial Family Communication: No family at bedside Disposition:   Status is: Inpatient  Remains inpatient appropriate because: Severity of illness       Consultants:  None  Procedures:  CT head 01/01/2021 CT abdomen and pelvis 01/02/2021 Chest x-ray 01/01/2021  Antimicrobials:  Amoxicillin 01/04/2021>>>> IV cefepime 01/02/2021>>>> 01/03/2021 IV Flagyl 01/02/2021>>>> 01/03/2021 IV vancomycin 01/02/2021>>>>> 01/03/2021   Subjective: Sitting up in recliner.  Denies any chest pain.  No shortness of breath.  No abdominal pain.  Objective: Vitals:   01/08/21 2151 01/09/21 0510 01/09/21 0659 01/09/21 0948  BP: (!) 177/77 (!) 181/72 (!) 170/72 (!) 145/75  Pulse:  63  76  Resp:  15    Temp:  (!) 97.4 F (36.3 C)    TempSrc:  Oral    SpO2:  95%    Weight:      Height:        Intake/Output Summary (Last 24 hours) at 01/09/2021 1340 Last data filed at 01/09/2021 0725 Gross per 24 hour  Intake 120 ml  Output 4200 ml  Net -4080 ml   Filed Weights   01/01/21 2322 01/03/21 0411 01/04/21 0500  Weight: 66 kg 69.1 kg 70.2 kg    Examination:  General exam: Appears calm and comfortable  Respiratory system: Clear to auscultation anterior lung fields.  No wheezes, no crackles, no rhonchi.Marland Kitchen Respiratory effort normal. Cardiovascular system: S1 & S2 heard, RRR. No JVD, murmurs, rubs, gallops or clicks. No pedal edema. Gastrointestinal system: Abdomen is nondistended, soft and nontender. No organomegaly or masses felt. Normal bowel sounds heard. Central nervous system: Alert. No focal neurological deficits. Extremities: Symmetric 5 x 5 power. Skin: No rashes, lesions or ulcers Psychiatry: Judgement and insight appear fair. Mood & affect appropriate.     Data Reviewed: I have personally reviewed following labs and imaging studies  CBC: Recent Labs  Lab 01/05/21 0340 01/06/21 0339 01/07/21 0338 01/08/21 0401  01/09/21 0342  WBC 7.2 5.7 6.2 5.8 7.0  NEUTROABS 4.6 3.3 3.5 3.3 4.4  HGB 11.8* 11.8* 12.0 11.9* 12.4  HCT 35.9* 36.3 37.3 35.6* 38.0  MCV 78.0* 78.2* 79.0* 77.9* 79.3*  PLT 294 299 268 245 244    Basic Metabolic Panel: Recent Labs  Lab 01/05/21 0340 01/06/21 0339 01/07/21 0338 01/08/21 0401 01/09/21 0342  NA 142 142 140 136 141  K 3.2* 3.7 3.8 3.1* 4.2  CL 112* 110 109 105 105  CO2 26 27 26 23 28   GLUCOSE 160* 113* 213* 264* 293*  BUN 10 8 14 12 15   CREATININE 0.37* 0.50 0.52 0.49 0.54  CALCIUM 7.9* 8.6* 9.2 9.6 10.2  MG 1.9 1.9 1.9 1.5* 2.0    GFR: Estimated Creatinine Clearance: 56.4 mL/min (by C-G formula based on SCr of 0.54 mg/dL).  Liver Function Tests: Recent Labs  Lab 01/03/21 0417  AST 14*  ALT 16  ALKPHOS 49  BILITOT 0.9  PROT 5.9*  ALBUMIN 2.6*    CBG: Recent Labs  Lab 01/08/21 1120 01/08/21 1700 01/08/21 2102 01/09/21 0726 01/09/21 1144  GLUCAP 304* 240* 279* 313* 329*     Recent Results (from the past 240 hour(s))  Urine Culture     Status: Abnormal   Collection Time: 01/01/21  6:20 AM   Specimen: In/Out Cath Urine  Result Value Ref Range Status   Specimen Description   Final    IN/OUT CATH URINE Performed at  [facility], [address], [place], [place] [address]    Special Requests   Final    NONE Performed at [facility], [address] [place], [place] [address]    Culture >=100,000 COLONIES/mL ENTEROCOCCUS FAECALIS (A)  Final   Report Status 01/04/2021 FINAL  Final   Organism ID, Bacteria ENTEROCOCCUS FAECALIS (A)  Final      Susceptibility   Enterococcus faecalis - MIC*    AMPICILLIN <=2 SENSITIVE Sensitive     NITROFURANTOIN <=16 SENSITIVE Sensitive     VANCOMYCIN 1 SENSITIVE Sensitive     * >=100,000 COLONIES/mL ENTEROCOCCUS FAECALIS  Blood Culture (routine x 2)     Status: None   Collection Time: 01/01/21 11:40 PM   Specimen: BLOOD  Result Value Ref Range Status    Specimen Description   Final    BLOOD LEFT ANTECUBITAL Performed at [facility], [address] [place], [place] [address]    Special Requests   Final    BOTTLES DRAWN AEROBIC AND ANAEROBIC Blood Culture results may not be optimal due to an inadequate volume of blood received in culture bottles Performed at [facility], [address] [place], [place] [address]    Culture   Final    NO GROWTH 5 DAYS Performed at [facility], [address] [place], [place] [address]    Report Status 01/07/2021 FINAL  Final  Resp Panel by RT-PCR (Flu A&B, Covid) Nasopharyngeal Swab     Status: None   Collection Time: 01/02/21  1:01 AM   Specimen: Nasopharyngeal Swab; Nasopharyngeal(NP) swabs in vial transport medium  Result Value Ref Range Status   SARS Coronavirus 2 by RT PCR NEGATIVE NEGATIVE Final    Comment: (NOTE) SARS-CoV-2 target nucleic acids are NOT DETECTED.  The SARS-CoV-2 RNA is generally detectable in upper respiratory specimens during the acute phase of infection. The lowest concentration of SARS-CoV-2 viral copies this assay can detect is 138 copies/mL. A negative result does not preclude SARS-Cov-2 infection and should not be used as the sole basis for treatment or other patient management decisions. A negative result may occur with  improper specimen collection/handling, submission of specimen other than nasopharyngeal swab, presence of viral mutation(s) within the areas targeted by this assay, and inadequate number of viral copies(<138 copies/mL). A negative result must be combined with clinical observations, patient history, and epidemiological information. The expected result is Negative.  Fact Sheet for Patients:  BloggerCourse.com  Fact Sheet for Healthcare Providers:  SeriousBroker.it  This test is no t yet approved or cleared by the [place] FDA and  has been  authorized for detection and/or diagnosis of SARS-CoV-2 by FDA under an Emergency Use Authorization (EUA). This EUA will remain  in effect (meaning this test can be used) for the duration of the COVID-19 declaration under Section 564(b)(1) of the Act, 21 U.S.C.section 360bbb-3(b)(1), unless the authorization is terminated  or revoked sooner.       Influenza A by PCR NEGATIVE NEGATIVE Final   Influenza B by PCR NEGATIVE NEGATIVE Final    Comment: (NOTE) The Xpert Xpress SARS-CoV-2/FLU/RSV plus assay is intended as an aid in the diagnosis of influenza from Nasopharyngeal swab specimens and should not be used as a sole basis for treatment. Nasal washings and aspirates are unacceptable for Xpert Xpress SARS-CoV-2/FLU/RSV testing.  Fact Sheet for Patients: BloggerCourse.com  Fact Sheet for Healthcare Providers: SeriousBroker.it  This test is not yet approved or cleared  by the [place] and has been authorized for detection and/or diagnosis of SARS-CoV-2 by FDA under an Emergency Use Authorization (EUA). This EUA will remain in effect (meaning this test can be used) for the duration of the COVID-19 declaration under Section 564(b)(1) of the Act, 21 U.S.C. section 360bbb-3(b)(1), unless the authorization is terminated or revoked.  Performed at [facility], [address] [place], [place] [address]   MRSA Next Gen by PCR, Nasal     Status: None   Collection Time: 01/02/21  9:31 AM   Specimen: Nasal Mucosa; Nasal Swab  Result Value Ref Range Status   MRSA by PCR Next Gen NOT DETECTED NOT DETECTED Final    Comment: (NOTE) The GeneXpert MRSA Assay (FDA approved for NASAL specimens only), is one component of a comprehensive MRSA colonization surveillance program. It is not intended to diagnose MRSA infection nor to guide or monitor treatment for MRSA infections. Test performance is not FDA approved in  patients less than 17 years old. Performed at [facility], [address], [place], [place] [address]   Gastrointestinal Panel by PCR , Stool     Status: None   Collection Time: 01/05/21 10:04 AM   Specimen: Stool  Result Value Ref Range Status   Campylobacter species NOT DETECTED NOT DETECTED Final   Plesimonas shigelloides NOT DETECTED NOT DETECTED Final   Salmonella species NOT DETECTED NOT DETECTED Final   Yersinia enterocolitica NOT DETECTED NOT DETECTED Final   Vibrio species NOT DETECTED NOT DETECTED Final   Vibrio cholerae NOT DETECTED NOT DETECTED Final   Enteroaggregative E coli (EAEC) NOT DETECTED NOT DETECTED Final   Enteropathogenic E coli (EPEC) NOT DETECTED NOT DETECTED Final   Enterotoxigenic E coli (ETEC) NOT DETECTED NOT DETECTED Final   Shiga like toxin producing E coli (STEC) NOT DETECTED NOT DETECTED Final   Shigella/Enteroinvasive E coli (EIEC) NOT DETECTED NOT DETECTED Final   Cryptosporidium NOT DETECTED NOT DETECTED Final   Cyclospora cayetanensis NOT DETECTED NOT DETECTED Final   Entamoeba histolytica NOT DETECTED NOT DETECTED Final   Giardia lamblia NOT DETECTED NOT DETECTED Final   Adenovirus F40/41 NOT DETECTED NOT DETECTED Final   Astrovirus NOT DETECTED NOT DETECTED Final   Norovirus GI/GII NOT DETECTED NOT DETECTED Final   Rotavirus A NOT DETECTED NOT DETECTED Final   Sapovirus (I, II, IV, and V) NOT DETECTED NOT DETECTED Final    Comment: Performed at [facility], [address] [place], [place] [address]         Radiology Studies: No results found.      Scheduled Meds:  amoxicillin  500 mg Oral Q8H   Chlorhexidine Gluconate Cloth  6 each Topical Daily   enoxaparin (LOVENOX) injection  40 mg Subcutaneous Q24H   feeding supplement (GLUCERNA SHAKE)  237 mL Oral TID BM   fludrocortisone  0.1 mg Oral QHS   Gerhardt's butt cream   Topical TID   hydrocortisone  100 mg Rectal QHS   insulin aspart  0-15 Units  Subcutaneous TID WC   insulin aspart  0-5 Units Subcutaneous QHS   insulin glargine-yfgn  18 Units Subcutaneous Daily   levothyroxine  100 mcg Oral Q0600   losartan  25 mg Oral Daily   memantine  10 mg Oral Q12H   multivitamin with minerals  1 tablet Oral Daily   predniSONE  5 mg Oral q morning   Ensure Max Protein  11 oz Oral Daily   QUEtiapine  25 mg Oral QHS   rosuvastatin  5 mg Oral QHS   tacrolimus  0.5 mg Oral Daily   Continuous Infusions:   LOS: 7 days    Time spent: 35 minutes    Ramiro Harvest, MD [facility]   To contact the attending provider between 7A-7P or the covering provider during after hours 7P-7A, please log into the web site [email or web address] and access using universal [place] password for that web site. If you do not have the password, please call the hospital operator.  01/09/2021, 1:40 PM

## 2021-01-09 NOTE — Plan of Care (Signed)
  Problem: Health Behavior/Discharge Planning: Goal: Ability to manage health-related needs will improve Outcome: Progressing   Problem: Clinical Measurements: Goal: Diagnostic test results will improve Outcome: Progressing   Problem: Nutrition: Goal: Adequate nutrition will be maintained Outcome: Progressing   

## 2021-01-09 NOTE — Plan of Care (Signed)
°  Problem: Clinical Measurements: °Goal: Will remain free from infection °Outcome: Progressing °  °Problem: Activity: °Goal: Risk for activity intolerance will decrease °Outcome: Progressing °  °Problem: Nutrition: °Goal: Adequate nutrition will be maintained °Outcome: Progressing °  °

## 2021-01-10 LAB — BASIC METABOLIC PANEL
Anion gap: 6 (ref 5–15)
BUN: 15 mg/dL (ref 8–23)
CO2: 27 mmol/L (ref 22–32)
Calcium: 9.9 mg/dL (ref 8.9–10.3)
Chloride: 105 mmol/L (ref 98–111)
Creatinine, Ser: 0.61 mg/dL (ref 0.44–1.00)
GFR, Estimated: >60 mL/min (ref >60)
Glucose, Bld: 207 mg/dL — ABNORMAL HIGH (ref 70–99)
Potassium: 3.8 mmol/L (ref 3.5–5.1)
Sodium: 138 mmol/L (ref 135–145)

## 2021-01-10 LAB — CBC WITH DIFFERENTIAL/PLATELET
Abs Immature Granulocytes: 0.07 10*3/uL (ref 0.00–0.07)
Basophils Absolute: 0.1 10*3/uL (ref 0.0–0.1)
Basophils Relative: 1 %
Eosinophils Absolute: 0.1 10*3/uL (ref 0.0–0.5)
Eosinophils Relative: 1 %
HCT: 37.3 % (ref 36.0–46.0)
Hemoglobin: 12.1 g/dL (ref 12.0–15.0)
Immature Granulocytes: 1 %
Lymphocytes Relative: 25 %
Lymphs Abs: 2.1 10*3/uL (ref 0.7–4.0)
MCH: 26 pg (ref 26.0–34.0)
MCHC: 32.4 g/dL (ref 30.0–36.0)
MCV: 80 fL (ref 80.0–100.0)
Monocytes Absolute: 0.6 10*3/uL (ref 0.1–1.0)
Monocytes Relative: 7 %
Neutro Abs: 5.6 10*3/uL (ref 1.7–7.7)
Neutrophils Relative %: 65 %
Platelets: 240 10*3/uL (ref 150–400)
RBC: 4.66 MIL/uL (ref 3.87–5.11)
RDW: 16.3 % — ABNORMAL HIGH (ref 11.5–15.5)
WBC: 8.5 10*3/uL (ref 4.0–10.5)
nRBC: 0 % (ref 0.0–0.2)

## 2021-01-10 LAB — MAGNESIUM: Magnesium: 1.9 mg/dL (ref 1.7–2.4)

## 2021-01-10 LAB — GLUCOSE, CAPILLARY
Glucose-Capillary: 204 mg/dL — ABNORMAL HIGH (ref 70–99)
Glucose-Capillary: 263 mg/dL — ABNORMAL HIGH (ref 70–99)
Glucose-Capillary: 244 mg/dL — ABNORMAL HIGH (ref 70–99)
Glucose-Capillary: 173 mg/dL — ABNORMAL HIGH (ref 70–99)

## 2021-01-10 MED ORDER — AMOXICILLIN 250 MG PO CHEW
500.0000 mg | CHEWABLE_TABLET | Freq: Three times a day (TID) | ORAL | Status: DC
Start: 2021-01-10 — End: 2021-01-10

## 2021-01-10 MED ORDER — INSULIN GLARGINE-YFGN 100 UNIT/ML ~~LOC~~ SOLN
20.0000 [IU] | Freq: Every day | SUBCUTANEOUS | Status: DC
Start: 2021-01-10 — End: 2021-01-13
  Administered 2021-01-10 – 2021-01-12 (×3): 20 [IU] via SUBCUTANEOUS
  Filled 2021-01-10 (×4): qty 0.2

## 2021-01-10 NOTE — Progress Notes (Signed)
PROGRESS NOTE    Emma Maldonado  ZOX:[id number] DOB: [birth year] DOA: 01/01/2021 PCP: Patient, No Pcp Per (Inactive)   Chief Complaint  Patient presents with   Hypotension    Brief Narrative:  Emma Maldonado is a 70 yo female with PMH dementia, renal transplant, DMII, HTN who presented with reported worsening mentation.  She was also noted to be hypoglycemic on initial evaluation with EMS.  She was also evaluated by her daughter and also felt to be more confused and lethargic.  After reevaluation she was found to be hypotensive and therefore brought to the ER for further evaluation.   She was recently discharged on 12/30/2020 after being hospitalized for proctitis.  She had also undergone flex sig on 12/28/2020 which revealed severe inflammation in the rectum with out any masses or lesions.  She was also evaluated by infectious disease during her hospitalization due to positive blood cultures which were felt to be contamination.  She had been treated with some antibiotics during hospital course as well. She was discharged with cortenema for her rectal inflammation to continue at discharge.    She was started on broad spectrum abx on admission and admitted for further workup.     Assessment & Plan:   Active Problems:   History of renal transplant   Hypertension   Dementia without behavioral disturbance (HCC)   Pressure injury of skin   Sepsis (HCC)   Acute urinary retention   Chronic diarrhea   Acute lower UTI  #1 sepsis, POA -Patient noted to have presented with hypothermia tachypnea, work-up with urinalysis consistent with UTI. -Patient also with ongoing recent rectal inflammation with bilateral ongoing lower lobe opacity seen on prior CT 12/12. -On initial presentation patient received IV Vanco/cefepime/Flagyl with a negative procalcitonin. -IV antibiotics discontinued on admission. -Due to baseline dementia difficult to determine symptom complaints as patient did have  significant urinary retention requiring placement of Foley catheter 01/03/2021 with urine cultures positive for E faecalis. -Continue amoxicillin.  2.  Acute urinary retention -Noted on CT abdomen pelvis with significant bladder distention. -Patient also noted to have some overflow incontinence noted per staff with bedside bladder scan on 01/03/2021 with at least 800 cc of urine in the bladder. -Due to patient's debility, dementia, recurrent hospitalization risk for ongoing retention Foley cath replaced. -Improvement with generalized weakness as patient noted to be ambulating with physical therapy. -Foley catheter placed 01/03/2021. -Foley catheter initially removed 01/06/2021 but due to multiple I and O requirements for 24 hours failed voiding trial. -Patient noted to have severely distended bladder on CT on admission with description baseline for overflow incontinence and as such likely with chronic retention and Foley catheter placed back on 01/08/2021. -Dr. Frederick Peers has contacted patient's urology office at [facility] and closest appointment is March which patient is scheduled for but patient will be called if any cancellations prior to that. -Patient will need to be discharged with Foley catheter and may not be able to go back to prior facility with indwelling Foley catheter and as such another facility may need to be pursued.  TOC following.  3.  Acute metabolic encephalopathy -Resolved. -Likely secondary to acute infection.  4.  Chronic diarrhea -Afebrile.  Normal white count.  No abdominal pain.   -GI pathogen panel negative.   -Imodium as needed.    5.  Dementia without behavioral disturbance -Likely close to baseline.   6.  History of renal transplant -No abnormalities noted of transplanted cleanly on CT abdomen and  pelvis. -Continue prednisone, tacrolimus. -Outpatient follow-up.  7.  History of orthostatic hypotension -Continue Florinef.  8.   Hypertension -Continue Cozaar.    DVT prophylaxis: Lovenox Code Status: Partial Family Communication: No family at bedside Disposition:   Status is: Inpatient  Remains inpatient appropriate because: Severity of illness       Consultants:  None  Procedures:  CT head 01/01/2021 CT abdomen and pelvis 01/02/2021 Chest x-ray 01/01/2021  Antimicrobials:  Amoxicillin 01/04/2021>>>> IV cefepime 01/02/2021>>>> 01/03/2021 IV Flagyl 01/02/2021>>>> 01/03/2021 IV vancomycin 01/02/2021>>>>> 01/03/2021   Subjective: Sitting up in chair.  Does not like her lunch.  No chest pain.  No shortness of breath.  No abdominal pain.  Overall feeling better.  Objective: Vitals:   01/09/21 0948 01/09/21 1449 01/09/21 2026 01/10/21 0544  BP: (!) 145/75 (!) 142/62 (!) 148/71 132/60  Pulse: 76 72 76 65  Resp:  20 19 15   Temp:  98.4 F (36.9 C) 98.5 F (36.9 C) 98.3 F (36.8 C)  TempSrc:  Oral Oral Oral  SpO2:  96% 94% 96%  Weight:      Height:        Intake/Output Summary (Last 24 hours) at 01/10/2021 1241 Last data filed at 01/10/2021 1055 Gross per 24 hour  Intake 360 ml  Output 3100 ml  Net -2740 ml    Filed Weights   01/01/21 2322 01/03/21 0411 01/04/21 0500  Weight: 66 kg 69.1 kg 70.2 kg    Examination:  General exam: NAD. Respiratory system: CTA B.  No wheezes, no crackles, no rhonchi.  Normal respiratory effort.  Speaking in full sentences.  Cardiovascular system: Regular rate and rhythm no murmurs rubs or gallops.  No JVD.  No lower extremity edema.  Gastrointestinal system: Abdomen is soft, nontender, nondistended, positive bowel sounds.  No rebound.  No guarding. Central nervous system: Alert. No focal neurological deficits. Extremities: Symmetric 5 x 5 power. Skin: No rashes, lesions or ulcers Psychiatry: Judgement and insight appear fair. Mood & affect appropriate.     Data Reviewed: I have personally reviewed following labs and imaging  studies  CBC: Recent Labs  Lab 01/06/21 0339 01/07/21 0338 01/08/21 0401 01/09/21 0342 01/10/21 0354  WBC 5.7 6.2 5.8 7.0 8.5  NEUTROABS 3.3 3.5 3.3 4.4 5.6  HGB 11.8* 12.0 11.9* 12.4 12.1  HCT 36.3 37.3 35.6* 38.0 37.3  MCV 78.2* 79.0* 77.9* 79.3* 80.0  PLT 299 268 245 244 240     Basic Metabolic Panel: Recent Labs  Lab 01/06/21 0339 01/07/21 0338 01/08/21 0401 01/09/21 0342 01/10/21 0354  NA 142 140 136 141 138  K 3.7 3.8 3.1* 4.2 3.8  CL 110 109 105 105 105  CO2 27 26 23 28 27   GLUCOSE 113* 213* 264* 293* 207*  BUN 8 14 12 15 15   CREATININE 0.50 0.52 0.49 0.54 0.61  CALCIUM 8.6* 9.2 9.6 10.2 9.9  MG 1.9 1.9 1.5* 2.0 1.9     GFR: Estimated Creatinine Clearance: 56.4 mL/min (by C-G formula based on SCr of 0.61 mg/dL).  Liver Function Tests: No results for input(s): AST, ALT, ALKPHOS, BILITOT, PROT, ALBUMIN in the last 168 hours.   CBG: Recent Labs  Lab 01/09/21 1144 01/09/21 1806 01/09/21 2024 01/10/21 0745 01/10/21 1145  GLUCAP 329* 202* 238* 204* 263*      Recent Results (from the past 240 hour(s))  Urine Culture     Status: Abnormal   Collection Time: 01/01/21  6:20 AM   Specimen: In/Out Cath Urine  Result  Value Ref Range Status   Specimen Description   Final    IN/OUT CATH URINE Performed at [facility], [address] [place], [place] [address]    Special Requests   Final    NONE Performed at [facility], [address], [place], [place] [address]    Culture >=100,000 COLONIES/mL ENTEROCOCCUS FAECALIS (A)  Final   Report Status 01/04/2021 FINAL  Final   Organism ID, Bacteria ENTEROCOCCUS FAECALIS (A)  Final      Susceptibility   Enterococcus faecalis - MIC*    AMPICILLIN <=2 SENSITIVE Sensitive     NITROFURANTOIN <=16 SENSITIVE Sensitive     VANCOMYCIN 1 SENSITIVE Sensitive     * >=100,000 COLONIES/mL ENTEROCOCCUS FAECALIS  Blood Culture (routine x 2)     Status: None   Collection Time:  01/01/21 11:40 PM   Specimen: BLOOD  Result Value Ref Range Status   Specimen Description   Final    BLOOD LEFT ANTECUBITAL Performed at [facility], [address], [place], [place] [address]    Special Requests   Final    BOTTLES DRAWN AEROBIC AND ANAEROBIC Blood Culture results may not be optimal due to an inadequate volume of blood received in culture bottles Performed at [facility], [address], [place], [place] [address]    Culture   Final    NO GROWTH 5 DAYS Performed at [facility], [address] [place], [place] [address]    Report Status 01/07/2021 FINAL  Final  Resp Panel by RT-PCR (Flu A&B, Covid) Nasopharyngeal Swab     Status: None   Collection Time: 01/02/21  1:01 AM   Specimen: Nasopharyngeal Swab; Nasopharyngeal(NP) swabs in vial transport medium  Result Value Ref Range Status   SARS Coronavirus 2 by RT PCR NEGATIVE NEGATIVE Final    Comment: (NOTE) SARS-CoV-2 target nucleic acids are NOT DETECTED.  The SARS-CoV-2 RNA is generally detectable in upper respiratory specimens during the acute phase of infection. The lowest concentration of SARS-CoV-2 viral copies this assay can detect is 138 copies/mL. A negative result does not preclude SARS-Cov-2 infection and should not be used as the sole basis for treatment or other patient management decisions. A negative result may occur with  improper specimen collection/handling, submission of specimen other than nasopharyngeal swab, presence of viral mutation(s) within the areas targeted by this assay, and inadequate number of viral copies(<138 copies/mL). A negative result must be combined with clinical observations, patient history, and epidemiological information. The expected result is Negative.  Fact Sheet for Patients:  BloggerCourse.com  Fact Sheet for Healthcare Providers:  SeriousBroker.it  This test is no t  yet approved or cleared by the [place] FDA and  has been authorized for detection and/or diagnosis of SARS-CoV-2 by FDA under an Emergency Use Authorization (EUA). This EUA will remain  in effect (meaning this test can be used) for the duration of the COVID-19 declaration under Section 564(b)(1) of the Act, 21 U.S.C.section 360bbb-3(b)(1), unless the authorization is terminated  or revoked sooner.       Influenza A by PCR NEGATIVE NEGATIVE Final   Influenza B by PCR NEGATIVE NEGATIVE Final    Comment: (NOTE) The Xpert Xpress SARS-CoV-2/FLU/RSV plus assay is intended as an aid in the diagnosis of influenza from Nasopharyngeal swab specimens and should not be used as a sole basis for treatment. Nasal washings and aspirates are unacceptable for Xpert Xpress SARS-CoV-2/FLU/RSV testing.  Fact Sheet  for Patients: BloggerCourse.com  Fact Sheet for Healthcare Providers: SeriousBroker.it  This test is not yet approved or cleared by the [place] FDA and has been authorized for detection and/or diagnosis of SARS-CoV-2 by FDA under an Emergency Use Authorization (EUA). This EUA will remain in effect (meaning this test can be used) for the duration of the COVID-19 declaration under Section 564(b)(1) of the Act, 21 U.S.C. section 360bbb-3(b)(1), unless the authorization is terminated or revoked.  Performed at [facility], [address] [place], [place] [address]   MRSA Next Gen by PCR, Nasal     Status: None   Collection Time: 01/02/21  9:31 AM   Specimen: Nasal Mucosa; Nasal Swab  Result Value Ref Range Status   MRSA by PCR Next Gen NOT DETECTED NOT DETECTED Final    Comment: (NOTE) The GeneXpert MRSA Assay (FDA approved for NASAL specimens only), is one component of a comprehensive MRSA colonization surveillance program. It is not intended to diagnose MRSA infection nor to guide or monitor treatment for  MRSA infections. Test performance is not FDA approved in patients less than 74 years old. Performed at [facility], [address], [place], [place] [address]   Gastrointestinal Panel by PCR , Stool     Status: None   Collection Time: 01/05/21 10:04 AM   Specimen: Stool  Result Value Ref Range Status   Campylobacter species NOT DETECTED NOT DETECTED Final   Plesimonas shigelloides NOT DETECTED NOT DETECTED Final   Salmonella species NOT DETECTED NOT DETECTED Final   Yersinia enterocolitica NOT DETECTED NOT DETECTED Final   Vibrio species NOT DETECTED NOT DETECTED Final   Vibrio cholerae NOT DETECTED NOT DETECTED Final   Enteroaggregative E coli (EAEC) NOT DETECTED NOT DETECTED Final   Enteropathogenic E coli (EPEC) NOT DETECTED NOT DETECTED Final   Enterotoxigenic E coli (ETEC) NOT DETECTED NOT DETECTED Final   Shiga like toxin producing E coli (STEC) NOT DETECTED NOT DETECTED Final   Shigella/Enteroinvasive E coli (EIEC) NOT DETECTED NOT DETECTED Final   Cryptosporidium NOT DETECTED NOT DETECTED Final   Cyclospora cayetanensis NOT DETECTED NOT DETECTED Final   Entamoeba histolytica NOT DETECTED NOT DETECTED Final   Giardia lamblia NOT DETECTED NOT DETECTED Final   Adenovirus F40/41 NOT DETECTED NOT DETECTED Final   Astrovirus NOT DETECTED NOT DETECTED Final   Norovirus GI/GII NOT DETECTED NOT DETECTED Final   Rotavirus A NOT DETECTED NOT DETECTED Final   Sapovirus (I, II, IV, and V) NOT DETECTED NOT DETECTED Final    Comment: Performed at [facility], [address], [place], [place] [address]          Radiology Studies: No results found.      Scheduled Meds:  Chlorhexidine Gluconate Cloth  6 each Topical Daily   enoxaparin (LOVENOX) injection  40 mg Subcutaneous Q24H   feeding supplement (GLUCERNA SHAKE)  237 mL Oral TID BM   fludrocortisone  0.1 mg Oral QHS   Gerhardt's butt cream   Topical TID   hydrocortisone  100 mg Rectal QHS    insulin aspart  0-15 Units Subcutaneous TID WC   insulin aspart  0-5 Units Subcutaneous QHS   insulin glargine-yfgn  20 Units Subcutaneous Daily   levothyroxine  100 mcg Oral Q0600   losartan  25 mg Oral Daily   memantine  10 mg Oral Q12H   multivitamin with minerals  1 tablet Oral Daily   predniSONE  5 mg Oral q morning  Ensure Max Protein  11 oz Oral Daily   QUEtiapine  25 mg Oral QHS   rosuvastatin  5 mg Oral QHS   tacrolimus  0.5 mg Oral Daily   Continuous Infusions:   LOS: 8 days    Time spent: 35 minutes    Ramiro Harvest, MD [facility]   To contact the attending provider between 7A-7P or the covering provider during after hours 7P-7A, please log into the web site [email or web address] and access using universal [place] password for that web site. If you do not have the password, please call the hospital operator.  01/10/2021, 12:41 PM

## 2021-01-10 NOTE — TOC Progression Note (Addendum)
Transition of Care [facility]) - Progression Note    Patient Details  Name: Emma Maldonado MRN: [id number] Date of Birth: March 06, 1950  Transition of Care [facility]) CM/SW Contact  Geni Bers, RN Phone Number: 01/10/2021, 1:41 PM  Clinical Narrative:    Spoke with pt's daughter and husband concerning discharge plans. Daughter is asking for pt to go back to [facility].  A call was made to [facility] to ask if [facility] could come in for Foley Cath Care with no answer VM was left.    Expected Discharge Plan: Assisted Living Barriers to Discharge: Continued Medical Work up  Expected Discharge Plan and Services Expected Discharge Plan: Assisted Living   Discharge Planning Services: CM Consult   Living arrangements for the past 2 months: Assisted Living Facility                                       Social Determinants of Health (SDOH) Interventions    Readmission Risk Interventions Readmission Risk Prevention Plan 01/03/2021  Transportation Screening Complete  HRI or Home Care Consult Complete  Social Work Consult for Recovery Care Planning/Counseling Complete  Palliative Care Screening Not Applicable  Medication Review Oceanographer) Complete  Some recent data might be hidden

## 2021-01-10 NOTE — Progress Notes (Addendum)
PT Cancellation Note  Patient Details Name: Emma Maldonado MRN: [id number] DOB: 1950-04-17   Cancelled Treatment:    Reason Eval/Treat Not Completed: Patient declined, no reason specified. Pt declined ambulation, but when asked if she would walk later she nodded.  Will check back later as schedule permits.  Addendum: Returned and pt eating lunch.    Enzo Montgomery 01/10/2021, 9:28 AM

## 2021-01-10 NOTE — Progress Notes (Signed)
Nutrition Follow-up  INTERVENTION:   -Glucerna Shake po TID, each supplement provides 220 kcal and 10 grams of protein  -Ensure MAX Protein po daily, each supplement provides 150 kcal and 30 grams of protein   -Multivitamin with minerals daily  NUTRITION DIAGNOSIS:   Increased nutrient needs related to acute illness as evidenced by estimated needs.  Ongoing.  GOAL:   Patient will meet greater than or equal to 90% of their needs  Progressing.  MONITOR:   PO intake, Supplement acceptance, Labs, Weight trends, Skin, I & O's  ASSESSMENT:   70 yo female who is Spanish-speaking with a PMH of renal transplant on chronic immune suppression, recent proctitis, DM2, hypothyroidism, dementia. Presenting with altered mental status. History per daughter. She reports that she received a call last night stating that the patient was hypoglycemic. EMS was called and the patient was given fluids. The patient's glucose recovered. When the daughter saw the patient, the patient seemed confused and disheveled. She then became more lethargic. They rechecked her sugars and they were improved. EMS was called again. They check her vitals and found she was hypotensive. She was given fluids and brought to the ED for evaluation.  Patient currently consuming 50-75% of meals. Accepting Glucerna at least daily and Ensure Max.   Admission weight: 145 lbs. Current weight: 154 lbs  I/Os: -10.9L since admit UOP: 2L x 24 hrs  Medications: Multivitamin with minerals daily Labs reviewed:  CBGs: 202-329  Diet Order:   Diet Order             Diet Carb Modified Fluid consistency: Thin; Room service appropriate? Yes  Diet effective now                   EDUCATION NEEDS:   Education needs have been addressed  Skin:  Skin Assessment: Skin Integrity Issues: Skin Integrity Issues:: Stage II Stage II: Sacrum  Last BM:  12/29 -type 5  Height:   Ht Readings from Last 1 Encounters:  01/01/21 5'  (1.524 m)    Weight:   Wt Readings from Last 1 Encounters:  01/04/21 70.2 kg    BMI:  Body mass index is 30.23 kg/m.  Estimated Nutritional Needs:   Kcal:  1700-1900  Protein:  75-90 grams  Fluid:  >1.7 L  Tilda Franco, MS, RD, LDN Inpatient Clinical Dietitian Contact information available via Amion

## 2021-01-10 NOTE — Plan of Care (Signed)
  Problem: Clinical Measurements: Goal: Ability to maintain clinical measurements within normal limits will improve Outcome: Progressing Goal: Will remain free from infection Outcome: Progressing   Problem: Activity: Goal: Risk for activity intolerance will decrease Outcome: Progressing   Problem: Nutrition: Goal: Adequate nutrition will be maintained Outcome: Progressing   Problem: Safety: Goal: Ability to remain free from injury will improve Outcome: Progressing   

## 2021-01-11 LAB — CBC WITH DIFFERENTIAL/PLATELET
Abs Immature Granulocytes: 0.06 10*3/uL (ref 0.00–0.07)
Basophils Absolute: 0.1 10*3/uL (ref 0.0–0.1)
Basophils Relative: 1 %
Eosinophils Absolute: 0 10*3/uL (ref 0.0–0.5)
Eosinophils Relative: 0 %
HCT: 38.5 % (ref 36.0–46.0)
Hemoglobin: 12.2 g/dL (ref 12.0–15.0)
Immature Granulocytes: 1 %
Lymphocytes Relative: 28 %
Lymphs Abs: 2.4 10*3/uL (ref 0.7–4.0)
MCH: 25.5 pg — ABNORMAL LOW (ref 26.0–34.0)
MCHC: 31.7 g/dL (ref 30.0–36.0)
MCV: 80.4 fL (ref 80.0–100.0)
Monocytes Absolute: 0.6 10*3/uL (ref 0.1–1.0)
Monocytes Relative: 7 %
Neutro Abs: 5.3 10*3/uL (ref 1.7–7.7)
Neutrophils Relative %: 63 %
Platelets: 223 10*3/uL (ref 150–400)
RBC: 4.79 MIL/uL (ref 3.87–5.11)
RDW: 16 % — ABNORMAL HIGH (ref 11.5–15.5)
WBC: 8.4 10*3/uL (ref 4.0–10.5)
nRBC: 0 % (ref 0.0–0.2)

## 2021-01-11 LAB — BASIC METABOLIC PANEL
Anion gap: 10 (ref 5–15)
BUN: 15 mg/dL (ref 8–23)
CO2: 27 mmol/L (ref 22–32)
Calcium: 10 mg/dL (ref 8.9–10.3)
Chloride: 104 mmol/L (ref 98–111)
Creatinine, Ser: 0.54 mg/dL (ref 0.44–1.00)
GFR, Estimated: >60 mL/min (ref >60)
Glucose, Bld: 174 mg/dL — ABNORMAL HIGH (ref 70–99)
Potassium: 4 mmol/L (ref 3.5–5.1)
Sodium: 141 mmol/L (ref 135–145)

## 2021-01-11 LAB — GLUCOSE, CAPILLARY
Glucose-Capillary: 159 mg/dL — ABNORMAL HIGH (ref 70–99)
Glucose-Capillary: 277 mg/dL — ABNORMAL HIGH (ref 70–99)
Glucose-Capillary: 268 mg/dL — ABNORMAL HIGH (ref 70–99)
Glucose-Capillary: 108 mg/dL — ABNORMAL HIGH (ref 70–99)

## 2021-01-11 LAB — MAGNESIUM: Magnesium: 1.7 mg/dL (ref 1.7–2.4)

## 2021-01-11 MED ORDER — MAGNESIUM SULFATE 4 GM/100ML IV SOLN
4.0000 g | Freq: Once | INTRAVENOUS | Status: AC
  Administered 2021-01-11: 4 g via INTRAVENOUS
  Filled 2021-01-11: qty 100

## 2021-01-11 NOTE — Progress Notes (Signed)
PROGRESS NOTE    Emma Maldonado  AOZ:[id number] DOB: [birth year] DOA: 01/01/2021 PCP: Patient, No Pcp Per (Inactive)   Chief Complaint  Patient presents with   Hypotension    Brief Narrative:  Ms. Enyeart is a 70 yo female with PMH dementia, renal transplant, DMII, HTN who presented with reported worsening mentation.  She was also noted to be hypoglycemic on initial evaluation with EMS.  She was also evaluated by her daughter and also felt to be more confused and lethargic.  After reevaluation she was found to be hypotensive and therefore brought to the ER for further evaluation.   She was recently discharged on 12/30/2020 after being hospitalized for proctitis.  She had also undergone flex sig on 12/28/2020 which revealed severe inflammation in the rectum with out any masses or lesions.  She was also evaluated by infectious disease during her hospitalization due to positive blood cultures which were felt to be contamination.  She had been treated with some antibiotics during hospital course as well. She was discharged with cortenema for her rectal inflammation to continue at discharge.    She was started on broad spectrum abx on admission and admitted for further workup.     Assessment & Plan:   Active Problems:   History of renal transplant   Hypertension   Dementia without behavioral disturbance (HCC)   Pressure injury of skin   Sepsis (HCC)   Acute urinary retention   Chronic diarrhea   Acute lower UTI  #1 sepsis, POA -Patient noted to have presented with hypothermia tachypnea, work-up with urinalysis consistent with UTI. -Patient also with ongoing recent rectal inflammation with bilateral ongoing lower lobe opacity seen on prior CT 12/12. -On initial presentation patient received IV Vanco/cefepime/Flagyl with a negative procalcitonin. -IV antibiotics discontinued on admission. -Due to baseline dementia difficult to determine symptom complaints as patient did have  significant urinary retention requiring placement of Foley catheter 01/03/2021 with urine cultures positive for E faecalis. -Patient completed course of amoxicillin/course of antibiotic treatment and antibiotics discontinued 01/10/2021.   2.  Acute urinary retention -Noted on CT abdomen pelvis with significant bladder distention. -Patient also noted to have some overflow incontinence noted per staff with bedside bladder scan on 01/03/2021 with at least 800 cc of urine in the bladder. -Due to patient's debility, dementia, recurrent hospitalization risk for ongoing retention Foley cath replaced. -Improvement with generalized weakness as patient noted to be ambulating with physical therapy. -Foley catheter placed 01/03/2021. -Foley catheter initially removed 01/06/2021 but due to multiple I and O requirements for 24 hours failed voiding trial. -Patient noted to have severely distended bladder on CT on admission with description baseline for overflow incontinence and as such likely with chronic retention and Foley catheter placed back on 01/08/2021. -Dr. Frederick Peers has contacted patient's urology office at [facility] and closest appointment is March which patient is scheduled for but patient will be called if any cancellations prior to that. -Patient will need to be discharged with Foley catheter and may not be able to go back to prior facility with indwelling Foley catheter and as such another facility may need to be pursued.  TOC following.  3.  Acute metabolic encephalopathy -Likely secondary to acute infection.   -Resolved.   4.  Chronic diarrhea -Afebrile.  Normal white count.  No abdominal pain.   -GI pathogen panel negative.   -Imodium as needed.    5.  Dementia without behavioral disturbance -Likely close to baseline.   6.  History of renal transplant -No abnormalities noted of transplanted cleanly on CT abdomen and pelvis. -Continue prednisone, tacrolimus. -Outpatient  follow-up.  7.  History of orthostatic hypotension -Florinef.  8.  Hypertension -Cozaar.    DVT prophylaxis: Lovenox Code Status: Partial Family Communication: No family at bedside Disposition:   Status is: Inpatient  Remains inpatient appropriate because: Unsafe disposition       Consultants:  None  Procedures:  CT head 01/01/2021 CT abdomen and pelvis 01/02/2021 Chest x-ray 01/01/2021  Antimicrobials:  Amoxicillin 01/04/2021>>>> 01/10/2021 IV cefepime 01/02/2021>>>> 01/03/2021 IV Flagyl 01/02/2021>>>> 01/03/2021 IV vancomycin 01/02/2021>>>>> 01/03/2021   Subjective: Sleeping but arousable.  Denies any chest pain.  No shortness of breath.  No abdominal pain.  Tolerated breakfast this morning per RN.  Objective: Vitals:   01/10/21 2040 01/10/21 2050 01/11/21 0544 01/11/21 1344  BP: 136/67 131/71 127/60 (!) 121/58  Pulse: 74 76 65 72  Resp: 17 17 16 18   Temp: 98.8 F (37.1 C) 98 F (36.7 C) 97.7 F (36.5 C) 97.8 F (36.6 C)  TempSrc: Oral Oral Oral Oral  SpO2: 99% 99% 94% 99%  Weight:      Height:        Intake/Output Summary (Last 24 hours) at 01/11/2021 1544 Last data filed at 01/11/2021 1300 Gross per 24 hour  Intake 1360 ml  Output 2800 ml  Net -1440 ml    Filed Weights   01/01/21 2322 01/03/21 0411 01/04/21 0500  Weight: 66 kg 69.1 kg 70.2 kg    Examination:  General exam: NAD. Respiratory system: Lungs clear to auscultation bilaterally.  No wheezes, no crackles, no rhonchi.  Normal respiratory effort.  Speaking in full sentences. Cardiovascular system: Regular rate rhythm no murmurs rubs or gallops.  No JVD.  No lower extremity edema.  Gastrointestinal system: Abdomen is soft, nontender, nondistended, positive bowel sounds.  No rebound.  No guarding. Central nervous system: Alert. No focal neurological deficits. Extremities: Symmetric 5 x 5 power. Skin: No rashes, lesions or ulcers Psychiatry: Judgement and insight appear fair. Mood  & affect appropriate.     Data Reviewed: I have personally reviewed following labs and imaging studies  CBC: Recent Labs  Lab 01/07/21 0338 01/08/21 0401 01/09/21 0342 01/10/21 0354 01/11/21 0515  WBC 6.2 5.8 7.0 8.5 8.4  NEUTROABS 3.5 3.3 4.4 5.6 5.3  HGB 12.0 11.9* 12.4 12.1 12.2  HCT 37.3 35.6* 38.0 37.3 38.5  MCV 79.0* 77.9* 79.3* 80.0 80.4  PLT 268 245 244 240 223     Basic Metabolic Panel: Recent Labs  Lab 01/07/21 0338 01/08/21 0401 01/09/21 0342 01/10/21 0354 01/11/21 0515  NA 140 136 141 138 141  K 3.8 3.1* 4.2 3.8 4.0  CL 109 105 105 105 104  CO2 26 23 28 27 27   GLUCOSE 213* 264* 293* 207* 174*  BUN 14 12 15 15 15   CREATININE 0.52 0.49 0.54 0.61 0.54  CALCIUM 9.2 9.6 10.2 9.9 10.0  MG 1.9 1.5* 2.0 1.9 1.7     GFR: Estimated Creatinine Clearance: 56.4 mL/min (by C-G formula based on SCr of 0.54 mg/dL).  Liver Function Tests: No results for input(s): AST, ALT, ALKPHOS, BILITOT, PROT, ALBUMIN in the last 168 hours.   CBG: Recent Labs  Lab 01/10/21 1145 01/10/21 1651 01/10/21 2055 01/11/21 0723 01/11/21 1228  GLUCAP 263* 244* 173* 159* 277*      Recent Results (from the past 240 hour(s))  Blood Culture (routine x 2)     Status: None  Collection Time: 01/01/21 11:40 PM   Specimen: BLOOD  Result Value Ref Range Status   Specimen Description   Final    BLOOD LEFT ANTECUBITAL Performed at [facility], [address], [place], [place] [address]    Special Requests   Final    BOTTLES DRAWN AEROBIC AND ANAEROBIC Blood Culture results may not be optimal due to an inadequate volume of blood received in culture bottles Performed at [facility], [address] [place], [place] [address]    Culture   Final    NO GROWTH 5 DAYS Performed at [facility], [address] [place], [place] [address]    Report Status 01/07/2021 FINAL  Final  Resp Panel by RT-PCR (Flu A&B, Covid) Nasopharyngeal Swab      Status: None   Collection Time: 01/02/21  1:01 AM   Specimen: Nasopharyngeal Swab; Nasopharyngeal(NP) swabs in vial transport medium  Result Value Ref Range Status   SARS Coronavirus 2 by RT PCR NEGATIVE NEGATIVE Final    Comment: (NOTE) SARS-CoV-2 target nucleic acids are NOT DETECTED.  The SARS-CoV-2 RNA is generally detectable in upper respiratory specimens during the acute phase of infection. The lowest concentration of SARS-CoV-2 viral copies this assay can detect is 138 copies/mL. A negative result does not preclude SARS-Cov-2 infection and should not be used as the sole basis for treatment or other patient management decisions. A negative result may occur with  improper specimen collection/handling, submission of specimen other than nasopharyngeal swab, presence of viral mutation(s) within the areas targeted by this assay, and inadequate number of viral copies(<138 copies/mL). A negative result must be combined with clinical observations, patient history, and epidemiological information. The expected result is Negative.  Fact Sheet for Patients:  BloggerCourse.com  Fact Sheet for Healthcare Providers:  SeriousBroker.it  This test is no t yet approved or cleared by the [place] FDA and  has been authorized for detection and/or diagnosis of SARS-CoV-2 by FDA under an Emergency Use Authorization (EUA). This EUA will remain  in effect (meaning this test can be used) for the duration of the COVID-19 declaration under Section 564(b)(1) of the Act, 21 U.S.C.section 360bbb-3(b)(1), unless the authorization is terminated  or revoked sooner.       Influenza A by PCR NEGATIVE NEGATIVE Final   Influenza B by PCR NEGATIVE NEGATIVE Final    Comment: (NOTE) The Xpert Xpress SARS-CoV-2/FLU/RSV plus assay is intended as an aid in the diagnosis of influenza from Nasopharyngeal swab specimens and should not be used as a sole basis  for treatment. Nasal washings and aspirates are unacceptable for Xpert Xpress SARS-CoV-2/FLU/RSV testing.  Fact Sheet for Patients: BloggerCourse.com  Fact Sheet for Healthcare Providers: SeriousBroker.it  This test is not yet approved or cleared by the [place] FDA and has been authorized for detection and/or diagnosis of SARS-CoV-2 by FDA under an Emergency Use Authorization (EUA). This EUA will remain in effect (meaning this test can be used) for the duration of the COVID-19 declaration under Section 564(b)(1) of the Act, 21 U.S.C. section 360bbb-3(b)(1), unless the authorization is terminated or revoked.  Performed at [facility], [address] [place], [place] [address]   MRSA Next Gen by PCR, Nasal     Status: None   Collection Time: 01/02/21  9:31 AM   Specimen: Nasal Mucosa; Nasal Swab  Result Value Ref Range Status   MRSA by PCR Next Gen NOT DETECTED NOT DETECTED Final  Comment: (NOTE) The GeneXpert MRSA Assay (FDA approved for NASAL specimens only), is one component of a comprehensive MRSA colonization surveillance program. It is not intended to diagnose MRSA infection nor to guide or monitor treatment for MRSA infections. Test performance is not FDA approved in patients less than 95 years old. Performed at [facility], [address], [place], [place] [address]   Gastrointestinal Panel by PCR , Stool     Status: None   Collection Time: 01/05/21 10:04 AM   Specimen: Stool  Result Value Ref Range Status   Campylobacter species NOT DETECTED NOT DETECTED Final   Plesimonas shigelloides NOT DETECTED NOT DETECTED Final   Salmonella species NOT DETECTED NOT DETECTED Final   Yersinia enterocolitica NOT DETECTED NOT DETECTED Final   Vibrio species NOT DETECTED NOT DETECTED Final   Vibrio cholerae NOT DETECTED NOT DETECTED Final   Enteroaggregative E coli (EAEC) NOT DETECTED  NOT DETECTED Final   Enteropathogenic E coli (EPEC) NOT DETECTED NOT DETECTED Final   Enterotoxigenic E coli (ETEC) NOT DETECTED NOT DETECTED Final   Shiga like toxin producing E coli (STEC) NOT DETECTED NOT DETECTED Final   Shigella/Enteroinvasive E coli (EIEC) NOT DETECTED NOT DETECTED Final   Cryptosporidium NOT DETECTED NOT DETECTED Final   Cyclospora cayetanensis NOT DETECTED NOT DETECTED Final   Entamoeba histolytica NOT DETECTED NOT DETECTED Final   Giardia lamblia NOT DETECTED NOT DETECTED Final   Adenovirus F40/41 NOT DETECTED NOT DETECTED Final   Astrovirus NOT DETECTED NOT DETECTED Final   Norovirus GI/GII NOT DETECTED NOT DETECTED Final   Rotavirus A NOT DETECTED NOT DETECTED Final   Sapovirus (I, II, IV, and V) NOT DETECTED NOT DETECTED Final    Comment: Performed at [facility], [address] [place], [place] [address]          Radiology Studies: No results found.      Scheduled Meds:  Chlorhexidine Gluconate Cloth  6 each Topical Daily   enoxaparin (LOVENOX) injection  40 mg Subcutaneous Q24H   feeding supplement (GLUCERNA SHAKE)  237 mL Oral TID BM   fludrocortisone  0.1 mg Oral QHS   Gerhardt's butt cream   Topical TID   hydrocortisone  100 mg Rectal QHS   insulin aspart  0-15 Units Subcutaneous TID WC   insulin aspart  0-5 Units Subcutaneous QHS   insulin glargine-yfgn  20 Units Subcutaneous Daily   levothyroxine  100 mcg Oral Q0600   losartan  25 mg Oral Daily   memantine  10 mg Oral Q12H   multivitamin with minerals  1 tablet Oral Daily   predniSONE  5 mg Oral q morning   Ensure Max Protein  11 oz Oral Daily   QUEtiapine  25 mg Oral QHS   rosuvastatin  5 mg Oral QHS   tacrolimus  0.5 mg Oral Daily   Continuous Infusions:   LOS: 9 days    Time spent: 35 minutes    Ramiro Harvest, MD [facility]   To contact the attending provider between 7A-7P or the covering provider during after hours 7P-7A, please log into the  web site [email or web address] and access using universal [place] password for that web site. If you do not have the password, please call the hospital operator.  01/11/2021, 3:44 PM

## 2021-01-12 LAB — BASIC METABOLIC PANEL
Anion gap: 6 (ref 5–15)
BUN: 17 mg/dL (ref 8–23)
CO2: 25 mmol/L (ref 22–32)
Calcium: 9.5 mg/dL (ref 8.9–10.3)
Chloride: 108 mmol/L (ref 98–111)
Creatinine, Ser: 0.5 mg/dL (ref 0.44–1.00)
GFR, Estimated: >60 mL/min (ref >60)
Glucose, Bld: 158 mg/dL — ABNORMAL HIGH (ref 70–99)
Potassium: 3.6 mmol/L (ref 3.5–5.1)
Sodium: 139 mmol/L (ref 135–145)

## 2021-01-12 LAB — GLUCOSE, CAPILLARY
Glucose-Capillary: 154 mg/dL — ABNORMAL HIGH (ref 70–99)
Glucose-Capillary: 258 mg/dL — ABNORMAL HIGH (ref 70–99)
Glucose-Capillary: 298 mg/dL — ABNORMAL HIGH (ref 70–99)
Glucose-Capillary: 307 mg/dL — ABNORMAL HIGH (ref 70–99)
Glucose-Capillary: 142 mg/dL — ABNORMAL HIGH (ref 70–99)

## 2021-01-12 LAB — MAGNESIUM: Magnesium: 2 mg/dL (ref 1.7–2.4)

## 2021-01-12 LAB — CBC WITH DIFFERENTIAL/PLATELET
Abs Immature Granulocytes: 0.04 10*3/uL (ref 0.00–0.07)
Basophils Absolute: 0 10*3/uL (ref 0.0–0.1)
Basophils Relative: 1 %
Eosinophils Absolute: 0.1 10*3/uL (ref 0.0–0.5)
Eosinophils Relative: 1 %
HCT: 39.4 % (ref 36.0–46.0)
Hemoglobin: 12.6 g/dL (ref 12.0–15.0)
Immature Granulocytes: 1 %
Lymphocytes Relative: 33 %
Lymphs Abs: 2.5 10*3/uL (ref 0.7–4.0)
MCH: 26.1 pg (ref 26.0–34.0)
MCHC: 32 g/dL (ref 30.0–36.0)
MCV: 81.6 fL (ref 80.0–100.0)
Monocytes Absolute: 0.5 10*3/uL (ref 0.1–1.0)
Monocytes Relative: 7 %
Neutro Abs: 4.6 10*3/uL (ref 1.7–7.7)
Neutrophils Relative %: 57 %
Platelets: 200 10*3/uL (ref 150–400)
RBC: 4.83 MIL/uL (ref 3.87–5.11)
RDW: 15.9 % — ABNORMAL HIGH (ref 11.5–15.5)
WBC: 7.7 10*3/uL (ref 4.0–10.5)
nRBC: 0 % (ref 0.0–0.2)

## 2021-01-12 NOTE — Progress Notes (Signed)
PROGRESS NOTE    Emma Maldonado  OYD:[id number] DOB: 01/19/1950 DOA: 01/01/2021 PCP: Patient, No Pcp Per (Inactive)   Chief Complaint  Patient presents with   Hypotension    Brief Narrative:  Emma Maldonado is a 71 yo female with PMH dementia, renal transplant, DMII, HTN who presented with reported worsening mentation.  She was also noted to be hypoglycemic on initial evaluation with EMS.  She was also evaluated by her daughter and also felt to be more confused and lethargic.  After reevaluation she was found to be hypotensive and therefore brought to the ER for further evaluation.   She was recently discharged on 12/30/2020 after being hospitalized for proctitis.  She had also undergone flex sig on 12/28/2020 which revealed severe inflammation in the rectum with out any masses or lesions.  She was also evaluated by infectious disease during her hospitalization due to positive blood cultures which were felt to be contamination.  She had been treated with some antibiotics during hospital course as well. She was discharged with cortenema for her rectal inflammation to continue at discharge.    She was started on broad spectrum abx on admission and admitted for further workup.     Assessment & Plan:   Active Problems:   History of renal transplant   Hypertension   Dementia without behavioral disturbance (HCC)   Pressure injury of skin   Sepsis (HCC)   Acute urinary retention   Chronic diarrhea   Acute lower UTI  1 sepsis, POA -Patient noted to have presented with hypothermia tachypnea, work-up with urinalysis consistent with UTI. -Patient also with ongoing recent rectal inflammation with bilateral ongoing lower lobe opacity seen on prior CT 12/12. -On initial presentation patient received IV Vanco/cefepime/Flagyl with a negative procalcitonin. -IV antibiotics discontinued on admission. -Due to baseline dementia difficult to determine symptom complaints as patient did have  significant urinary retention requiring placement of Foley catheter 01/03/2021 with urine cultures positive for E faecalis. -Patient completed course of amoxicillin/course of antibiotic treatment and antibiotics discontinued 01/10/2021.   2.  Acute urinary retention -Noted on CT abdomen pelvis with significant bladder distention. -Patient also noted to have some overflow incontinence noted per staff with bedside bladder scan on 01/03/2021 with at least 800 cc of urine in the bladder. -Due to patient's debility, dementia, recurrent hospitalization risk for ongoing retention Foley cath replaced. -Improvement with generalized weakness as patient noted to be ambulating with physical therapy. -Foley catheter placed 01/03/2021. -Foley catheter initially removed 01/06/2021 but due to multiple I and O requirements for 24 hours failed voiding trial. -Patient noted to have severely distended bladder on CT on admission with description baseline for overflow incontinence and as such likely with chronic retention and Foley catheter placed back on 01/08/2021. -Dr. Frederick Maldonado has contacted patient's urology office at [facility] and closest appointment is March which patient is scheduled for but patient will be called if any cancellations prior to that. -Patient will need to be discharged with Foley catheter and may not be able to go back to prior facility with indwelling Foley catheter and as such another facility may need to be pursued.  TOC following.  3.  Acute metabolic encephalopathy -Likely secondary to acute infection.   -Resolved.   4.  Chronic diarrhea -Afebrile.  Normal white count.  No abdominal pain.   -GI pathogen panel negative.   -Imodium as needed.    5.  Dementia without behavioral disturbance -Likely close to baseline.   6.  History of renal transplant -No abnormalities noted of transplanted cleanly on CT abdomen and pelvis. -Continue tacrolimus, prednisone.   -Outpatient  follow-up.    7.  History of orthostatic hypotension -Continue Florinef.  8.  Hypertension -Controlled on Cozaar.    DVT prophylaxis: Lovenox Code Status: Partial Family Communication: No family at bedside Disposition:   Status is: Inpatient  Remains inpatient appropriate because: Unsafe disposition       Consultants:  None  Procedures:  CT head 01/01/2021 CT abdomen and pelvis 01/02/2021 Chest x-ray 01/01/2021  Antimicrobials:  Amoxicillin 01/04/2021>>>> 01/10/2021 IV cefepime 01/02/2021>>>> 01/03/2021 IV Flagyl 01/02/2021>>>> 01/03/2021 IV vancomycin 01/02/2021>>>>> 01/03/2021   Subjective: Laying in bed.  No chest pain.  No shortness of breath.  Tolerating diet.   Objective: Vitals:   01/11/21 0544 01/11/21 1344 01/11/21 2225 01/12/21 0539  BP: 127/60 (!) 121/58 (!) 142/56 (!) 113/56  Pulse: 65 72 72 72  Resp: 16 18 14 16   Temp: 97.7 F (36.5 C) 97.8 F (36.6 C) 97.9 F (36.6 C) (!) 97.5 F (36.4 C)  TempSrc: Oral Oral Oral Oral  SpO2: 94% 99% 99% 96%  Weight:      Height:        Intake/Output Summary (Last 24 hours) at 01/12/2021 1301 Last data filed at 01/12/2021 1000 Gross per 24 hour  Intake 760 ml  Output 2650 ml  Net -1890 ml    Filed Weights   01/01/21 2322 01/03/21 0411 01/04/21 0500  Weight: 66 kg 69.1 kg 70.2 kg    Examination:  General exam: : NAD Respiratory system: CTA B.  No wheezes, no rhonchi.  Speaking in full sentences.  Normal respiratory effort. Cardiovascular system: Regular rate and rhythm no murmurs rubs or gallops.  No JVD.  No lower extremity edema.  Gastrointestinal system: Abdomen soft, nontender, nondistended, positive bowel sounds.  No rebound.  No guarding. Central nervous system: Alert and oriented. No focal neurological deficits. Extremities: Symmetric 5 x 5 power. Skin: No rashes, lesions or ulcers Psychiatry: Judgement and insight appear normal. Mood & affect appropriate.   Data Reviewed: I have  personally reviewed following labs and imaging studies  CBC: Recent Labs  Lab 01/08/21 0401 01/09/21 0342 01/10/21 0354 01/11/21 0515 01/12/21 0450  WBC 5.8 7.0 8.5 8.4 7.7  NEUTROABS 3.3 4.4 5.6 5.3 4.6  HGB 11.9* 12.4 12.1 12.2 12.6  HCT 35.6* 38.0 37.3 38.5 39.4  MCV 77.9* 79.3* 80.0 80.4 81.6  PLT 245 244 240 223 200     Basic Metabolic Panel: Recent Labs  Lab 01/08/21 0401 01/09/21 0342 01/10/21 0354 01/11/21 0515 01/12/21 0450  NA 136 141 138 141 139  K 3.1* 4.2 3.8 4.0 3.6  CL 105 105 105 104 108  CO2 23 28 27 27 25   GLUCOSE 264* 293* 207* 174* 158*  BUN 12 15 15 15 17   CREATININE 0.49 0.54 0.61 0.54 0.50  CALCIUM 9.6 10.2 9.9 10.0 9.5  MG 1.5* 2.0 1.9 1.7 2.0     GFR: Estimated Creatinine Clearance: 56.4 mL/min (by C-G formula based on SCr of 0.5 mg/dL).  Liver Function Tests: No results for input(s): AST, ALT, ALKPHOS, BILITOT, PROT, ALBUMIN in the last 168 hours.   CBG: Recent Labs  Lab 01/11/21 1228 01/11/21 1642 01/11/21 2108 01/12/21 0755 01/12/21 1118  GLUCAP 277* 268* 108* 154* 258*      Recent Results (from the past 240 hour(s))  Gastrointestinal Panel by PCR , Stool     Status: None   Collection  Time: 01/05/21 10:04 AM   Specimen: Stool  Result Value Ref Range Status   Campylobacter species NOT DETECTED NOT DETECTED Final   Plesimonas shigelloides NOT DETECTED NOT DETECTED Final   Salmonella species NOT DETECTED NOT DETECTED Final   Yersinia enterocolitica NOT DETECTED NOT DETECTED Final   Vibrio species NOT DETECTED NOT DETECTED Final   Vibrio cholerae NOT DETECTED NOT DETECTED Final   Enteroaggregative E coli (EAEC) NOT DETECTED NOT DETECTED Final   Enteropathogenic E coli (EPEC) NOT DETECTED NOT DETECTED Final   Enterotoxigenic E coli (ETEC) NOT DETECTED NOT DETECTED Final   Shiga like toxin producing E coli (STEC) NOT DETECTED NOT DETECTED Final   Shigella/Enteroinvasive E coli (EIEC) NOT DETECTED NOT DETECTED Final    Cryptosporidium NOT DETECTED NOT DETECTED Final   Cyclospora cayetanensis NOT DETECTED NOT DETECTED Final   Entamoeba histolytica NOT DETECTED NOT DETECTED Final   Giardia lamblia NOT DETECTED NOT DETECTED Final   Adenovirus F40/41 NOT DETECTED NOT DETECTED Final   Astrovirus NOT DETECTED NOT DETECTED Final   Norovirus GI/GII NOT DETECTED NOT DETECTED Final   Rotavirus A NOT DETECTED NOT DETECTED Final   Sapovirus (I, II, IV, and V) NOT DETECTED NOT DETECTED Final    Comment: Performed at [facility], [address], [place], [place] [address]          Radiology Studies: No results found.      Scheduled Meds:  Chlorhexidine Gluconate Cloth  6 each Topical Daily   enoxaparin (LOVENOX) injection  40 mg Subcutaneous Q24H   feeding supplement (GLUCERNA SHAKE)  237 mL Oral TID BM   fludrocortisone  0.1 mg Oral QHS   Gerhardt's butt cream   Topical TID   hydrocortisone  100 mg Rectal QHS   insulin aspart  0-15 Units Subcutaneous TID WC   insulin aspart  0-5 Units Subcutaneous QHS   insulin glargine-yfgn  20 Units Subcutaneous Daily   levothyroxine  100 mcg Oral Q0600   losartan  25 mg Oral Daily   memantine  10 mg Oral Q12H   multivitamin with minerals  1 tablet Oral Daily   predniSONE  5 mg Oral q morning   Ensure Max Protein  11 oz Oral Daily   QUEtiapine  25 mg Oral QHS   rosuvastatin  5 mg Oral QHS   tacrolimus  0.5 mg Oral Daily   Continuous Infusions:   LOS: 10 days    Time spent: 35 minutes    Ramiro Harvest, MD [facility]   To contact the attending provider between 7A-7P or the covering provider during after hours 7P-7A, please log into the web site [email or web address] and access using universal [place] password for that web site. If you do not have the password, please call the hospital operator.  01/12/2021, 1:01 PM

## 2021-01-13 LAB — CBC WITH DIFFERENTIAL/PLATELET
Abs Immature Granulocytes: 0.03 10*3/uL (ref 0.00–0.07)
Basophils Absolute: 0 10*3/uL (ref 0.0–0.1)
Basophils Relative: 1 %
Eosinophils Absolute: 0 10*3/uL (ref 0.0–0.5)
Eosinophils Relative: 0 %
HCT: 38.8 % (ref 36.0–46.0)
Hemoglobin: 12.2 g/dL (ref 12.0–15.0)
Immature Granulocytes: 1 %
Lymphocytes Relative: 25 %
Lymphs Abs: 1.6 10*3/uL (ref 0.7–4.0)
MCH: 25.9 pg — ABNORMAL LOW (ref 26.0–34.0)
MCHC: 31.4 g/dL (ref 30.0–36.0)
MCV: 82.4 fL (ref 80.0–100.0)
Monocytes Absolute: 0.4 10*3/uL (ref 0.1–1.0)
Monocytes Relative: 6 %
Neutro Abs: 4.3 10*3/uL (ref 1.7–7.7)
Neutrophils Relative %: 67 %
Platelets: 198 10*3/uL (ref 150–400)
RBC: 4.71 MIL/uL (ref 3.87–5.11)
RDW: 15.8 % — ABNORMAL HIGH (ref 11.5–15.5)
WBC: 6.4 10*3/uL (ref 4.0–10.5)
nRBC: 0 % (ref 0.0–0.2)

## 2021-01-13 LAB — GLUCOSE, CAPILLARY
Glucose-Capillary: 226 mg/dL — ABNORMAL HIGH (ref 70–99)
Glucose-Capillary: 153 mg/dL — ABNORMAL HIGH (ref 70–99)
Glucose-Capillary: 201 mg/dL — ABNORMAL HIGH (ref 70–99)
Glucose-Capillary: 184 mg/dL — ABNORMAL HIGH (ref 70–99)

## 2021-01-13 LAB — BASIC METABOLIC PANEL
Anion gap: 7 (ref 5–15)
BUN: 16 mg/dL (ref 8–23)
CO2: 27 mmol/L (ref 22–32)
Calcium: 9.9 mg/dL (ref 8.9–10.3)
Chloride: 107 mmol/L (ref 98–111)
Creatinine, Ser: 0.59 mg/dL (ref 0.44–1.00)
GFR, Estimated: >60 mL/min (ref >60)
Glucose, Bld: 241 mg/dL — ABNORMAL HIGH (ref 70–99)
Potassium: 4 mmol/L (ref 3.5–5.1)
Sodium: 141 mmol/L (ref 135–145)

## 2021-01-13 LAB — MAGNESIUM: Magnesium: 1.9 mg/dL (ref 1.7–2.4)

## 2021-01-13 MED ORDER — INSULIN GLARGINE-YFGN 100 UNIT/ML ~~LOC~~ SOLN
24.0000 [IU] | Freq: Every day | SUBCUTANEOUS | Status: DC
  Administered 2021-01-13: 24 [IU] via SUBCUTANEOUS
  Filled 2021-01-13 (×2): qty 0.24

## 2021-01-13 NOTE — Progress Notes (Signed)
Physical Therapy Treatment Patient Details Name: Emma Maldonado MRN: [id number] DOB: 01-27-1950 Today's Date: 01/13/2021   History of Present Illness 71 yo Spanish Speaking female admitted from memory care unit with AMS, UTI, SIRS. Hx of dementia, DM, COVID, renal transplant. Recent d/c 12/19    PT Comments    Pt progressing however overall weaker than last session. May benefit from SNF at d/c  Recommendations for follow up therapy are one component of a multi-disciplinary discharge planning process, led by the attending physician.  Recommendations may be updated based on patient status, additional functional criteria and insurance authorization.  Follow Up Recommendations  SNF     Assistance Recommended at Discharge Frequent or constant Supervision/Assistance  Equipment Recommendations  None recommended by PT    Recommendations for Other Services       Precautions / Restrictions Precautions Precautions: Fall Precaution Comments: incontinence Restrictions Weight Bearing Restrictions: No     Mobility  Bed Mobility Overal bed mobility: Needs Assistance Bed Mobility: Supine to Sit     Supine to sit: Min assist;HOB elevated     General bed mobility comments: assist to elevate trunk, incr time    Transfers Overall transfer level: Needs assistance Equipment used: Rolling walker (2 wheels) Transfers: Sit to/from Stand Sit to Stand: Min assist           General transfer comment: repeated STS x4 for peri-care.    Ambulation/Gait Ambulation/Gait assistance: Min assist Gait Distance (Feet): 18 Feet Assistive device: Rolling walker (2 wheels) Gait Pattern/deviations: Step-through pattern;Decreased stride length       General Gait Details: assist to balance and maneuver RW, in room distance d/t bowel incontinence, pt declined BSC   Stairs             Wheelchair Mobility    Modified Rankin (Stroke Patients Only)       Balance            Standing balance support: Bilateral upper extremity supported;Reliant on assistive device for balance Standing balance-Leahy Scale: Poor                              Cognition Arousal/Alertness: Awake/alert Behavior During Therapy: WFL for tasks assessed/performed Overall Cognitive Status: History of cognitive impairments - at baseline                                          Exercises      General Comments        Pertinent Vitals/Pain Pain Assessment: No/denies pain    Home Living                          Prior Function            PT Goals (current goals can now be found in the care plan section) Acute Rehab PT Goals Patient Stated Goal: none stated PT Goal Formulation: Patient unable to participate in goal setting Time For Goal Achievement: 01/17/21 Potential to Achieve Goals: Fair Progress towards PT goals: Progressing toward goals    Frequency    Min 3X/week      PT Plan Current plan remains appropriate    Co-evaluation              AM-PAC PT "6 Clicks" Mobility   Outcome Measure  Help needed  turning from your back to your side while in a flat bed without using bedrails?: A Little Help needed moving from lying on your back to sitting on the side of a flat bed without using bedrails?: A Little Help needed moving to and from a bed to a chair (including a wheelchair)?: A Little Help needed standing up from a chair using your arms (e.g., wheelchair or bedside chair)?: A Little Help needed to walk in hospital room?: A Little Help needed climbing 3-5 steps with a railing? : A Little 6 Click Score: 18    End of Session Equipment Utilized During Treatment: Gait belt Activity Tolerance: Patient tolerated treatment well Patient left: in chair;with call bell/phone within reach;with chair alarm set Nurse Communication: Mobility status PT Visit Diagnosis: Muscle weakness (generalized) (M62.81);Difficulty in walking,  not elsewhere classified (R26.2)     Time: 1414-1430 PT Time Calculation (min) (ACUTE ONLY): 16 min  Charges:  $Gait Training: 8-22 mins                     Delice Bison, PT  Acute Rehab Dept ([facility]) [phone] Pager [phone]  01/13/2021    [facility] 01/13/2021, 3:14 PM

## 2021-01-13 NOTE — [place] FL2 (Signed)
[place] MEDICAID FL2 LEVEL OF CARE SCREENING TOOL     IDENTIFICATION  Patient Name: Emma Maldonado Birthdate: 1950-09-26 Sex: female Admission Date (Current Location): 01/01/2021  [facility] and [place] Number:  Producer, television/film/video and Address:  [facility],  [address] [place], [place] [address]      Provider Number: [id number]  Attending Physician Name and Address:  Rodolph Bong, MD  Relative Name and Phone Number:  [name] Daughter  [phone],Kmetz,Paul Spouse   [phone]    Current Level of Care: Hospital Recommended Level of Care: Skilled Nursing Facility Prior Approval Number:    Date Approved/Denied:   PASRR Number: Pending  Discharge Plan: SNF    Current Diagnoses: Patient Active Problem List   Diagnosis Date Noted   Acute lower UTI    Chronic diarrhea 01/05/2021   Pressure injury of skin 01/03/2021   Sepsis (HCC) 01/03/2021   Acute urinary retention 01/03/2021   Bacteremia    Severe sepsis (HCC) 12/23/2020   Hypothyroidism 12/23/2020   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   Hypomagnesemia    Acute renal failure (HCC)    Hypotension 08/22/2020   Hydronephrosis of right kidney 05/01/2020   Rib fractures 05/01/2020   UTI (urinary tract infection) 03/25/2020   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   Hypokalemia 03/25/2020   Hypertension 03/25/2020   Dementia without behavioral disturbance (HCC) 03/25/2020   Altered mental status 03/25/2020   Uncontrolled type 2 diabetes mellitus with hyperglycemia (HCC) 03/25/2020   Accelerated hypertension 03/28/2012   Viral gastroenteritis 03/28/2012   History of renal transplant 03/26/2012   Diabetes mellitus (HCC) 03/26/2012   Hyperlipidemia 03/26/2012    Orientation RESPIRATION BLADDER Height & Weight     Self, Time, Situation, Place  Normal Incontinent, Indwelling catheter Weight: 154 lb 12.2 oz (70.2 kg) Height:  5' (152.4 cm)  BEHAVIORAL SYMPTOMS/MOOD  NEUROLOGICAL BOWEL NUTRITION STATUS      Incontinent Diet (Carb modified)  AMBULATORY STATUS COMMUNICATION OF NEEDS Skin   Limited Assist Verbally PU Stage and Appropriate Care   PU Stage 2 Dressing: Daily                   Personal Care Assistance Level of Assistance  Bathing, Feeding, Dressing Bathing Assistance: Limited assistance Feeding assistance: Independent Dressing Assistance: Limited assistance     Functional Limitations Info  Sight, Hearing, Speech Sight Info: Adequate Hearing Info: Adequate Speech Info: Adequate    SPECIAL CARE FACTORS FREQUENCY  PT (By licensed PT), OT (By licensed OT)     PT Frequency: x5 week OT Frequency: x5 week            Contractures Contractures Info: Not present    Additional Factors Info  Code Status, Allergies, Insulin Sliding Scale Code Status Info: Full Allergies Info: NKA   Insulin Sliding Scale Info: See discharge summary       Current Medications (01/13/2021):  This is the current hospital active medication list Current Facility-Administered Medications  Medication Dose Route Frequency Provider Last Rate Last Admin   Chlorhexidine Gluconate Cloth 2 % PADS 6 each  6 each Topical Daily Lewie Chamber, MD   6 each at 01/13/21 1059   enoxaparin (LOVENOX) injection 40 mg  40 mg Subcutaneous Q24H Kyle, Tyrone A, DO   40 mg at 01/13/21 1137   feeding supplement (GLUCERNA SHAKE) (GLUCERNA SHAKE) liquid 237 mL  237 mL Oral TID BM Lewie Chamber, MD   237 mL at 01/13/21 1059   fludrocortisone (  FLORINEF) tablet 0.1 mg  0.1 mg Oral QHS Lewie Chamber, MD   0.1 mg at 01/12/21 2120   Gerhardt's butt cream   Topical TID Lewie Chamber, MD   Given at 01/13/21 1059   hydrocortisone (CORTENEMA) enema 100 mg  100 mg Rectal QHS Lewie Chamber, MD   100 mg at 01/12/21 2123   insulin aspart (novoLOG) injection 0-15 Units  0-15 Units Subcutaneous TID WC Lewie Chamber, MD   3 Units at 01/13/21 1136   insulin aspart (novoLOG) injection 0-5  Units  0-5 Units Subcutaneous QHS Lewie Chamber, MD   1 Units at 01/12/21 2159   insulin glargine-yfgn (SEMGLEE) injection 24 Units  24 Units Subcutaneous Daily Rodolph Bong, MD   24 Units at 01/13/21 1059   levothyroxine (SYNTHROID) tablet 100 mcg  100 mcg Oral Q0600 Lewie Chamber, MD   100 mcg at 01/13/21 0500   loperamide (IMODIUM) capsule 4 mg  4 mg Oral Q4H PRN Lewie Chamber, MD   4 mg at 01/12/21 2120   losartan (COZAAR) tablet 25 mg  25 mg Oral Daily Rodolph Bong, MD   25 mg at 01/13/21 1059   memantine (NAMENDA) tablet 10 mg  10 mg Oral Orlene Erm, MD   10 mg at 01/13/21 1059   multivitamin with minerals tablet 1 tablet  1 tablet Oral Daily Lewie Chamber, MD   1 tablet at 01/13/21 1059   ondansetron (ZOFRAN) tablet 4 mg  4 mg Oral Q6H PRN Margie Ege A, DO       Or   ondansetron (ZOFRAN) injection 4 mg  4 mg Intravenous Q6H PRN Ronaldo Miyamoto, Tyrone A, DO       predniSONE (DELTASONE) tablet 5 mg  5 mg Oral q morning Lewie Chamber, MD   5 mg at 01/13/21 1100   protein supplement (ENSURE MAX) liquid  11 oz Oral Daily Lewie Chamber, MD   11 oz at 01/13/21 1059   QUEtiapine (SEROQUEL) tablet 25 mg  25 mg Oral Axel Filler, MD   25 mg at 01/12/21 2120   rosuvastatin (CRESTOR) tablet 5 mg  5 mg Oral Axel Filler, MD   5 mg at 01/12/21 2120   tacrolimus (PROGRAF) capsule 0.5 mg  0.5 mg Oral Daily Lewie Chamber, MD   0.5 mg at 01/13/21 1059     Discharge Medications: Please see discharge summary for a list of discharge medications.  Relevant Imaging Results:  Relevant Lab Results:   Additional Information SSN: [id number]  Ewing Schlein, LCSW

## 2021-01-13 NOTE — Progress Notes (Signed)
PROGRESS NOTE    Emma Maldonado  SVX:[id number] DOB: 1950/06/18 DOA: 01/01/2021 PCP: Patient, No Pcp Per (Inactive)   Chief Complaint  Patient presents with   Hypotension    Brief Narrative:  Emma Maldonado is a 71 yo female with PMH dementia, renal transplant, DMII, HTN who presented with reported worsening mentation.  She was also noted to be hypoglycemic on initial evaluation with EMS.  She was also evaluated by her daughter and also felt to be more confused and lethargic.  After reevaluation she was found to be hypotensive and therefore brought to the ER for further evaluation.   She was recently discharged on 12/30/2020 after being hospitalized for proctitis.  She had also undergone flex sig on 12/28/2020 which revealed severe inflammation in the rectum with out any masses or lesions.  She was also evaluated by infectious disease during her hospitalization due to positive blood cultures which were felt to be contamination.  She had been treated with some antibiotics during hospital course as well. She was discharged with cortenema for her rectal inflammation to continue at discharge.    She was started on broad spectrum abx on admission and admitted for further workup.     Assessment & Plan:   Active Problems:   History of renal transplant   Hypertension   Dementia without behavioral disturbance (HCC)   Pressure injury of skin   Sepsis (HCC)   Acute urinary retention   Chronic diarrhea   Acute lower UTI  1 sepsis, POA -Patient noted to have presented with hypothermia tachypnea, work-up with urinalysis consistent with UTI. -Patient also with ongoing recent rectal inflammation with bilateral ongoing lower lobe opacity seen on prior CT 12/12. -On initial presentation patient received IV Vanco/cefepime/Flagyl with a negative procalcitonin. -IV antibiotics discontinued on admission. -Due to baseline dementia difficult to determine symptom complaints as patient did have  significant urinary retention requiring placement of Foley catheter 01/03/2021 with urine cultures positive for E faecalis. -Patient completed course of amoxicillin/course of antibiotic treatment and antibiotics discontinued 01/10/2021.   2.  Acute urinary retention -Noted on CT abdomen pelvis with significant bladder distention. -Patient also noted to have some overflow incontinence noted per staff with bedside bladder scan on 01/03/2021 with at least 800 cc of urine in the bladder. -Due to patient's debility, dementia, recurrent hospitalization risk for ongoing retention Foley cath replaced. -Improvement with generalized weakness as patient noted to be ambulating with physical therapy. -Foley catheter placed 01/03/2021. -Foley catheter initially removed 01/06/2021 but due to multiple I and O requirements for 24 hours failed voiding trial. -Patient noted to have severely distended bladder on CT on admission with description baseline for overflow incontinence and as such likely with chronic retention and Foley catheter placed back on 01/08/2021. -Dr. Frederick Peers has contacted patient's urology office at [facility] and closest appointment is March which patient is scheduled for but patient will be called if any cancellations prior to that. -Patient will need to be discharged with Foley catheter and may not be able to go back to prior facility with indwelling Foley catheter and as such another facility may need to be pursued.  TOC following.  3.  Acute metabolic encephalopathy -Likely secondary to acute infection.   -Resolved.   4.  Chronic diarrhea -Afebrile.  Normal white count.  No abdominal pain.   -GI pathogen panel negative.   -Imodium as needed.    5.  Dementia without behavioral disturbance -Likely close to baseline.  6.  History of renal transplant -No abnormalities noted of transplanted cleanly on CT abdomen and pelvis. -Continue tacrolimus, prednisone.   -Outpatient  follow-up.    7.  History of orthostatic hypotension -Florinef.    8.  Hypertension -Cozaar.    DVT prophylaxis: Lovenox Code Status: Partial Family Communication: No family at bedside Disposition: Medically stable for discharge.  Status is: Inpatient  Remains inpatient appropriate because: Unsafe disposition       Consultants:  None  Procedures:  CT head 01/01/2021 CT abdomen and pelvis 01/02/2021 Chest x-ray 01/01/2021  Antimicrobials:  Amoxicillin 01/04/2021>>>> 01/10/2021 IV cefepime 01/02/2021>>>> 01/03/2021 IV Flagyl 01/02/2021>>>> 01/03/2021 IV vancomycin 01/02/2021>>>>> 01/03/2021   Subjective: Sleeping but arousable.  Tolerating diet.  No chest pain.  No shortness of breath.   Objective: Vitals:   01/12/21 1414 01/12/21 2151 01/13/21 0447 01/13/21 1241  BP: 114/60 132/64 119/63 129/62  Pulse: 79 73 65 69  Resp: 16 17 16 16   Temp: 98.7 F (37.1 C) 99 F (37.2 C) 98.5 F (36.9 C) 98.7 F (37.1 C)  TempSrc:  Oral Oral Oral  SpO2: 94% 100% 94% 96%  Weight:      Height:        Intake/Output Summary (Last 24 hours) at 01/13/2021 1342 Last data filed at 01/13/2021 1242 Gross per 24 hour  Intake 480 ml  Output 1600 ml  Net -1120 ml    Filed Weights   01/01/21 2322 01/03/21 0411 01/04/21 0500  Weight: 66 kg 69.1 kg 70.2 kg    Examination:  General exam: NAD. Respiratory system: Lungs clear to auscultation bilaterally anterior lung fields.  No wheezes, no crackles, no rhonchi.  Normal respiratory effort.  Speaking in full sentences.  Cardiovascular system: Regular rate rhythm no murmurs rubs or gallops.  No JVD.  No lower extremity edema.  Gastrointestinal system: Abdomen is soft, nontender, nondistended, positive bowel sounds.  No rebound.  No guarding.  Central nervous system: Alert and oriented. No focal neurological deficits. Extremities: Symmetric 5 x 5 power. Skin: No rashes, lesions or ulcers Psychiatry: Judgement and insight appear  fair. Mood & affect appropriate.   Data Reviewed: I have personally reviewed following labs and imaging studies  CBC: Recent Labs  Lab 01/09/21 0342 01/10/21 0354 01/11/21 0515 01/12/21 0450 01/13/21 0437  WBC 7.0 8.5 8.4 7.7 6.4  NEUTROABS 4.4 5.6 5.3 4.6 4.3  HGB 12.4 12.1 12.2 12.6 12.2  HCT 38.0 37.3 38.5 39.4 38.8  MCV 79.3* 80.0 80.4 81.6 82.4  PLT 244 240 223 200 198     Basic Metabolic Panel: Recent Labs  Lab 01/09/21 0342 01/10/21 0354 01/11/21 0515 01/12/21 0450 01/13/21 0437  NA 141 138 141 139 141  K 4.2 3.8 4.0 3.6 4.0  CL 105 105 104 108 107  CO2 28 27 27 25 27   GLUCOSE 293* 207* 174* 158* 241*  BUN 15 15 15 17 16   CREATININE 0.54 0.61 0.54 0.50 0.59  CALCIUM 10.2 9.9 10.0 9.5 9.9  MG 2.0 1.9 1.7 2.0 1.9     GFR: Estimated Creatinine Clearance: 56.4 mL/min (by C-G formula based on SCr of 0.59 mg/dL).  Liver Function Tests: No results for input(s): AST, ALT, ALKPHOS, BILITOT, PROT, ALBUMIN in the last 168 hours.   CBG: Recent Labs  Lab 01/12/21 1309 01/12/21 1651 01/12/21 2148 01/13/21 0724 01/13/21 1124  GLUCAP 298* 307* 142* 226* 153*      Recent Results (from the past 240 hour(s))  Gastrointestinal Panel by PCR , Stool  Status: None   Collection Time: 01/05/21 10:04 AM   Specimen: Stool  Result Value Ref Range Status   Campylobacter species NOT DETECTED NOT DETECTED Final   Plesimonas shigelloides NOT DETECTED NOT DETECTED Final   Salmonella species NOT DETECTED NOT DETECTED Final   Yersinia enterocolitica NOT DETECTED NOT DETECTED Final   Vibrio species NOT DETECTED NOT DETECTED Final   Vibrio cholerae NOT DETECTED NOT DETECTED Final   Enteroaggregative E coli (EAEC) NOT DETECTED NOT DETECTED Final   Enteropathogenic E coli (EPEC) NOT DETECTED NOT DETECTED Final   Enterotoxigenic E coli (ETEC) NOT DETECTED NOT DETECTED Final   Shiga like toxin producing E coli (STEC) NOT DETECTED NOT DETECTED Final    Shigella/Enteroinvasive E coli (EIEC) NOT DETECTED NOT DETECTED Final   Cryptosporidium NOT DETECTED NOT DETECTED Final   Cyclospora cayetanensis NOT DETECTED NOT DETECTED Final   Entamoeba histolytica NOT DETECTED NOT DETECTED Final   Giardia lamblia NOT DETECTED NOT DETECTED Final   Adenovirus F40/41 NOT DETECTED NOT DETECTED Final   Astrovirus NOT DETECTED NOT DETECTED Final   Norovirus GI/GII NOT DETECTED NOT DETECTED Final   Rotavirus A NOT DETECTED NOT DETECTED Final   Sapovirus (I, II, IV, and V) NOT DETECTED NOT DETECTED Final    Comment: Performed at [facility], [address] [place], [place] [address]          Radiology Studies: No results found.      Scheduled Meds:  Chlorhexidine Gluconate Cloth  6 each Topical Daily   enoxaparin (LOVENOX) injection  40 mg Subcutaneous Q24H   feeding supplement (GLUCERNA SHAKE)  237 mL Oral TID BM   fludrocortisone  0.1 mg Oral QHS   Gerhardt's butt cream   Topical TID   hydrocortisone  100 mg Rectal QHS   insulin aspart  0-15 Units Subcutaneous TID WC   insulin aspart  0-5 Units Subcutaneous QHS   insulin glargine-yfgn  24 Units Subcutaneous Daily   levothyroxine  100 mcg Oral Q0600   losartan  25 mg Oral Daily   memantine  10 mg Oral Q12H   multivitamin with minerals  1 tablet Oral Daily   predniSONE  5 mg Oral q morning   Ensure Max Protein  11 oz Oral Daily   QUEtiapine  25 mg Oral QHS   rosuvastatin  5 mg Oral QHS   tacrolimus  0.5 mg Oral Daily   Continuous Infusions:   LOS: 11 days    Time spent: 35 minutes    Ramiro Harvestaniel Zailyn Rowser, MD [facility]   To contact the attending provider between 7A-7P or the covering provider during after hours 7P-7A, please log into the web site [email or web address] and access using universal [place] password for that web site. If you do not have the password, please call the hospital operator.  01/13/2021, 1:42 PM

## 2021-01-13 NOTE — Plan of Care (Signed)

## 2021-01-13 NOTE — TOC Progression Note (Addendum)
Transition of Care [facility]) - Progression Note   Patient Details  Name: Emma Maldonado MRN: [id number] Date of Birth: 06-05-1950  [facility] CM/SW Contact  Ewing Schlein, LCSW Phone Number: 01/13/2021, 2:51 PM  Clinical Narrative: Lorenda Ishihara will not take the patient back with [facility] to manage the foley catheter until her outpatient urology appointment. Patient cannot go to SNF for therapy, so FL2 and initial referral were sent out to see if a SNF will accept the patient for catheter care. TOC awaiting bed offers.  Addendum: CSW contacted [place] [facility] to get the PASRR #: [id number].  Expected Discharge Plan: Assisted Living Barriers to Discharge: Continued Medical Work up  Expected Discharge Plan and Services Expected Discharge Plan: Assisted Living Discharge Planning Services: CM Consult Living arrangements for the past 2 months: Assisted Living Facility            Readmission Risk Interventions Readmission Risk Prevention Plan 01/03/2021  Transportation Screening Complete  HRI or Home Care Consult Complete  Social Work Consult for Recovery Care Planning/Counseling Complete  Palliative Care Screening Not Applicable  Medication Review [employer]) Complete  Some recent data might be hidden

## 2021-01-14 LAB — RESP PANEL BY RT-PCR (FLU A&B, COVID) ARPGX2
Influenza A by PCR: NEGATIVE
Influenza B by PCR: NEGATIVE
SARS Coronavirus 2 by RT PCR: NEGATIVE

## 2021-01-14 LAB — GLUCOSE, CAPILLARY
Glucose-Capillary: 129 mg/dL — ABNORMAL HIGH (ref 70–99)
Glucose-Capillary: 107 mg/dL — ABNORMAL HIGH (ref 70–99)
Glucose-Capillary: 260 mg/dL — ABNORMAL HIGH (ref 70–99)
Glucose-Capillary: 222 mg/dL — ABNORMAL HIGH (ref 70–99)

## 2021-01-14 MED ORDER — INSULIN ASPART 100 UNIT/ML IJ SOLN: INTRAMUSCULAR | 11 refills | Status: DC

## 2021-01-14 MED ORDER — LOSARTAN POTASSIUM 25 MG PO TABS: 25.0000 mg | ORAL_TABLET | Freq: Every day | ORAL | 1 refills | Status: DC

## 2021-01-14 MED ORDER — GLUCERNA SHAKE PO LIQD: 237.0000 mL | Freq: Three times a day (TID) | ORAL | 2 refills | Status: AC

## 2021-01-14 MED ORDER — GERHARDT'S BUTT CREAM: 1.0000 "application " | TOPICAL_CREAM | Freq: Three times a day (TID) | CUTANEOUS | Status: DC

## 2021-01-14 MED ORDER — INSULIN GLARGINE-YFGN 100 UNIT/ML ~~LOC~~ SOLN
18.0000 [IU] | Freq: Every day | SUBCUTANEOUS | Status: DC
  Administered 2021-01-14: 18 [IU] via SUBCUTANEOUS
  Filled 2021-01-14: qty 0.18

## 2021-01-14 MED ORDER — ENSURE MAX PROTEIN PO LIQD: 11.0000 [oz_av] | Freq: Every day | ORAL | 1 refills | Status: AC

## 2021-01-14 NOTE — Progress Notes (Signed)
Physical Therapy Treatment Patient Details Name: Emma Maldonado MRN: [id number] DOB: August 24, 1950 Today's Date: 01/14/2021   History of Present Illness 71 yo Spanish Speaking female admitted from memory care unit with AMS, UTI, SIRS. Hx of dementia, DM, COVID, renal transplant. Recent d/c 12/19    PT Comments    Pt  is progressing, incr activity tolerance today. Continue to recommend SNF  Recommendations for follow up therapy are one component of a multi-disciplinary discharge planning process, led by the attending physician.  Recommendations may be updated based on patient status, additional functional criteria and insurance authorization.  Follow Up Recommendations  Skilled nursing-short term rehab (<3 hours/day)     Assistance Recommended at Discharge Frequent or constant Supervision/Assistance  Patient can return home with the following     Equipment Recommendations  None recommended by PT    Recommendations for Other Services       Precautions / Restrictions Precautions Precautions: Fall Precaution Comments: incontinence Restrictions Weight Bearing Restrictions: No     Mobility  Bed Mobility Overal bed mobility: Needs Assistance Bed Mobility: Supine to Sit     Supine to sit: Min assist;HOB elevated     General bed mobility comments: assist to elevate trunk, incr time    Transfers Overall transfer level: Needs assistance Equipment used: Rolling walker (2 wheels) Transfers: Sit to/from Stand;Bed to chair/wheelchair/BSC Sit to Stand: Min assist     Step pivot transfers: Min assist     General transfer comment: min assist for balance and safety, directional cues for RW position, to pivot to [facility]    Ambulation/Gait Ambulation/Gait assistance: Min assist Gait Distance (Feet): 65 Feet (8') Assistive device: Rolling walker (2 wheels) Gait Pattern/deviations: Step-through pattern;Decreased stride length       General Gait Details: assist to balance and  maneuver RW. initial bowel incontinence   Stairs             Wheelchair Mobility    Modified Rankin (Stroke Patients Only)       Balance           Standing balance support: Bilateral upper extremity supported;Reliant on assistive device for balance Standing balance-Leahy Scale: Poor                              Cognition Arousal/Alertness: Awake/alert Behavior During Therapy: WFL for tasks assessed/performed Overall Cognitive Status: History of cognitive impairments - at baseline                                          Exercises      General Comments        Pertinent Vitals/Pain Pain Assessment: No/denies pain    Home Living                          Prior Function            PT Goals (current goals can now be found in the care plan section) Acute Rehab PT Goals Patient Stated Goal: none stated PT Goal Formulation: Patient unable to participate in goal setting Time For Goal Achievement: 01/24/21 Potential to Achieve Goals: Good Progress towards PT goals: Progressing toward goals    Frequency    Min 2X/week      PT Plan Current plan remains appropriate    Co-evaluation  AM-PAC PT "6 Clicks" Mobility   Outcome Measure  Help needed turning from your back to your side while in a flat bed without using bedrails?: A Little Help needed moving from lying on your back to sitting on the side of a flat bed without using bedrails?: A Little Help needed moving to and from a bed to a chair (including a wheelchair)?: A Little Help needed standing up from a chair using your arms (e.g., wheelchair or bedside chair)?: A Little Help needed to walk in hospital room?: A Little Help needed climbing 3-5 steps with a railing? : A Little 6 Click Score: 18    End of Session Equipment Utilized During Treatment: Gait belt Activity Tolerance: Patient tolerated treatment well Patient left: in chair;with  call bell/phone within reach;with chair alarm set Nurse Communication: Mobility status PT Visit Diagnosis: Muscle weakness (generalized) (M62.81);Difficulty in walking, not elsewhere classified (R26.2)     Time: 9242-6834 PT Time Calculation (min) (ACUTE ONLY): 17 min  Charges:  $Gait Training: 8-22 mins                     Delice Bison, PT  Acute Rehab Dept ([facility]) [phone] Pager [phone]  01/14/2021    [facility] 01/14/2021, 11:42 AM

## 2021-01-14 NOTE — Discharge Summary (Addendum)
Physician Discharge Summary  [facility]verarda [facility] RUE:[id number]:[id number] DOB: 12/31/1950 DOA: 01/01/2021  PCP: Patient, No Pcp Per (Inactive)  Admit date: 01/01/2021 Discharge date: 01/14/2021  Admitted From: Home Disposition:  SNF  Recommendations for Outpatient Follow-up:  Follow up with PCP in 1-2 weeks Please obtain BMP/CBC in one week Please follow up with urology as scheduled.  Discharge Condition:STABLE CODE STATUS: PARTIAL CODE/ DNI. Diet recommendation: Heart Healthy    Brief/Interim Summary: [name] is a 71 yo female with PMH dementia, renal transplant, DMII, HTN who presented with reported worsening mentation.  She was also noted to be hypoglycemic on initial evaluation with EMS.  She was also evaluated by her daughter and also felt to be more confused and lethargic.  After reevaluation she was found to be hypotensive and therefore brought to the ER for further evaluation.   She was recently discharged on 12/30/2020 after being hospitalized for proctitis.  She had also undergone flex sig on 12/28/2020 which revealed severe inflammation in the rectum with out any masses or lesions.  She was also evaluated by infectious disease during her hospitalization due to positive blood cultures which were felt to be contamination.  She had been treated with some antibiotics during hospital course as well.   She was started on broad spectrum abx on admission and admitted for further workup.  Discharge Diagnoses:  Active Problems:   History of renal transplant   Hypertension   Dementia without behavioral disturbance (HCC)   Pressure injury of skin   Sepsis (HCC)   Acute urinary retention   Chronic diarrhea   Acute lower UTI  sepsis, POA -Patient noted to have presented with hypothermia tachypnea, work-up with urinalysis consistent with UTI. -Patient also with ongoing recent rectal inflammation with bilateral ongoing lower lobe opacity seen on prior CT 12/12. -On initial presentation patient  received IV Vanco/cefepime/Flagyl with a negative procalcitonin. -IV antibiotics discontinued on admission. -Due to baseline dementia difficult to determine symptom complaints as patient did have significant urinary retention requiring placement of Foley catheter 01/03/2021 with urine cultures positive for E faecalis. -Patient completed course of amoxicillin/course of antibiotic treatment and antibiotics discontinued 01/10/2021.   2.  Acute urinary retention -Noted on CT abdomen pelvis with significant bladder distention. -Patient also noted to have some overflow incontinence noted per staff with bedside bladder scan on 01/03/2021 with at least 800 cc of urine in the bladder. -Due to patient's debility, dementia, recurrent hospitalization risk for ongoing retention Foley cath replaced. -Improvement with generalized weakness as patient noted to be ambulating with physical therapy. -Foley catheter placed 01/03/2021. -Foley catheter initially removed 01/06/2021 but due to multiple I and O requirements for 24 hours failed voiding trial. -Patient noted to have severely distended bladder on CT on admission with description baseline for overflow incontinence and as such likely with chronic retention and Foley catheter placed back on 01/08/2021. -Dr. Frederick PeersGirguis has [facility] and closest appointment is March which patient is scheduled for but patient will be called if any cancellations prior to that. -Patient will need to be discharged with Foley catheter and may not be able to go back to prior facility with indwelling Foley catheter and as such another facility may need to be pursued.  TOC following.  3.  Acute metabolic encephalopathy -Likely secondary to acute infection.   -Resolved.   4.  Chronic diarrhea -Afebrile.  Normal white count.  No abdominal pain.   -GI pathogen panel negative.   -Imodium  as needed.    5.  Dementia without behavioral  disturbance -Likely close to baseline.     6.  History of renal transplant -No abnormalities noted of transplanted cleanly on CT abdomen and pelvis. -Continue tacrolimus, prednisone.   -Outpatient follow-up.    7.  History of orthostatic hypotension -Florinef.    8.  Hypertension -Cozaar.           Discharge Instructions  Discharge Instructions     Diet - low sodium heart healthy   Complete by: As directed    Discharge wound care:   Complete by: As directed    Instructions as per wound care.   Increase activity slowly   Complete by: As directed       Allergies as of 01/14/2021   No Known Allergies      Medication List     STOP taking these medications    guaifenesin 100 MG/5ML syrup Commonly known as: ROBITUSSIN   insulin aspart 100 UNIT/ML injection Commonly known as: novoLOG Replaced by: insulin aspart 100 UNIT/ML injection       TAKE these medications    acetaminophen 500 MG tablet Commonly known as: TYLENOL Take 500 mg by mouth every 6 (six) hours as needed for fever or headache.   alum & mag hydroxide-simeth 200-200-20 MG/5ML suspension Commonly known as: MAALOX/MYLANTA Take 30 mLs by mouth every 6 (six) hours as needed for indigestion or heartburn.   feeding supplement (GLUCERNA SHAKE) Liqd Take 237 mLs by mouth 3 (three) times daily between meals.   Ensure Max Protein Liqd Take 330 mLs (11 oz total) by mouth daily. Start taking on: January 15, 2021   fludrocortisone 0.1 MG tablet Commonly known as: FLORINEF Take 0.1 mg by mouth at bedtime.   Gerhardt's butt cream Crea Apply 1 application topically 3 (three) times daily.   hydrocortisone 100 MG/60ML enema Commonly known as: CORTENEMA Place 1 enema (100 mg total) rectally at bedtime for 14 days.   insulin aspart 100 UNIT/ML injection Commonly known as: novoLOG CBG 70 - 120: 0 units  CBG 121 - 150: 2 units  CBG 151 - 200: 3 units  CBG 201 - 250: 5 units  CBG 251 - 300: 8 units   CBG 301 - 350: 11 units  CBG 351 - 400: 15 units Replaces: insulin aspart 100 UNIT/ML injection   Lantus SoloStar 100 UNIT/ML Solostar Pen Generic drug: insulin glargine Inject 20 Units into the skin in the morning. What changed: how much to take   levothyroxine 100 MCG tablet Commonly known as: SYNTHROID Take 100 mcg by mouth daily before breakfast.   loperamide 2 MG capsule Commonly known as: IMODIUM Take 2 mg by mouth as needed (with each loose stool for diarrhea- cannot exceed 8 doses a day).   losartan 25 MG tablet Commonly known as: COZAAR Take 1 tablet (25 mg total) by mouth daily. Start taking on: January 15, 2021   magnesium hydroxide 400 MG/5ML suspension Commonly known as: MILK OF MAGNESIA Take 30 mLs by mouth at bedtime as needed for mild constipation.   memantine 10 MG tablet Commonly known as: NAMENDA Take 10 mg by mouth every 12 (twelve) hours.   multivitamin with minerals Tabs tablet Take 1 tablet by mouth daily.   neomycin-bacitracin-polymyxin [phone] ointment Apply 1 application topically as needed (for minor skin tears or abrasions, after cleaning with normal saline- bandage with gauze and tape).   predniSONE 5 MG tablet Commonly known as: DELTASONE Take 5 mg by  mouth every morning.   QUEtiapine 25 MG tablet Commonly known as: SEROQUEL Take 25 mg by mouth at bedtime.   rosuvastatin 5 MG tablet Commonly known as: CRESTOR Take 5 mg by mouth at bedtime.   tacrolimus 0.5 MG capsule Commonly known as: PROGRAF Take 1 capsule (0.5 mg total) by mouth daily.   Vitamin D3 50 MCG (2000 UT) Tabs Take 2,000 Units by mouth daily.               Discharge Care Instructions  (From admission, onward)           Start     Ordered   01/14/21 0000  Discharge wound care:       Comments: Instructions as per wound care.   01/14/21 1307            Follow-up Information     Cherly Anderson, MD. Go on 04/07/2021.   Specialty: Urology Why:  1:30 pm. Contact information: [address] [place] [address] [phone]                No Known Allergies  Consultations: None.   Procedures/Studies: CT Abdomen Pelvis Wo Contrast  Result Date: 01/02/2021 CLINICAL DATA:  71 year old female with recent rectal inflammation, proctitis. Abdominal pain. Right lower quadrant transplant kidney. EXAM: CT ABDOMEN AND PELVIS WITHOUT CONTRAST TECHNIQUE: Multidetector CT imaging of the abdomen and pelvis was performed following the standard protocol without IV contrast. COMPARISON:  CT Abdomen and Pelvis 12/23/2020 and earlier. FINDINGS: Lower chest: Stable borderline to mild cardiomegaly. No pericardial or pleural effusion. But confluent bilateral lower lobe peribronchial opacity, worse on the right, persists and is new compared to August. The airspace disease has not significantly changed from December 12th. There are multiple bilateral chronic lower rib fractures. Hepatobiliary: Negative noncontrast liver with diminutive or absent gallbladder. Pancreas: Atrophied. Spleen: Negative. Adrenals/Urinary Tract: Normal adrenal glands. Bilateral native renal atrophy. Right lower quadrant transplant kidney with prominent collecting system unchanged from earlier this month. No pararenal inflammatory stranding. Dilated urinary bladder as before. Estimated bladder volume 750 mL. Transplant ureter redemonstrated along the right anterior bladder on series 2, image 77, no definite hydroureter or periureteral stranding. No transplant nephrolithiasis identified. Stomach/Bowel: Regressed but not fully resolved circumferential rectal wall thickening and perirectal inflammatory stranding since December 12th. Redundant large bowel with diverticulosis and retained stool elsewhere. Appendix remains gas-filled and normal near the midline on series 2, image 56. Midline cecum redemonstrated. Nondilated terminal ileum. No dilated small bowel. Decompressed stomach  and duodenum. No free air or free fluid. Vascular/Lymphatic: Diffusely advanced calcified atherosclerosis throughout the abdomen and pelvis. Normal caliber abdominal aorta. Vascular patency is not evaluated in the absence of IV contrast. No lymphadenopathy. Reproductive: Surgically absent uterus, diminutive or absent ovaries. Other: No pelvic free fluid. Chronic partially calcified nodular ventral abdominal wall thickening, probably the sequelae of chronic subcutaneous injections. Musculoskeletal: Chronic lower rib fractures. Chronic lower lumbar spine degeneration. Chronic right lumbar transverse process fractures. No acute or suspicious osseous lesion. IMPRESSION: 1. Unresolved bilateral lower lobe pulmonary opacity from earlier in this month, new since August and more compatible with lung infection than atelectasis. No pleural effusion. 2. Regressed but not resolved rectal inflammation since December 12th. No complicating features. No new bowel abnormality. Large bowel diverticulosis. Normal appendix. 3. Chronic distension of the urinary bladder (750 mL). Query urinary retention. Right lower quadrant transplant kidney with no obvious inflammation or obstructive uropathy. 4. No other acute or inflammatory process identified in the noncontrast  abdomen or pelvis. Aortic Atherosclerosis (ICD10-I70.0). Electronically Signed   By: Odessa FlemingH  Hall M.D.   On: 01/02/2021 04:36   CT Head Wo Contrast  Result Date: 01/01/2021 CLINICAL DATA:  Altered mental status. EXAM: CT HEAD WITHOUT CONTRAST TECHNIQUE: Contiguous axial images were obtained from the base of the skull through the vertex without intravenous contrast. COMPARISON:  March 24, 2020 FINDINGS: Brain: There is mild cerebral atrophy with widening of the extra-axial spaces and ventricular dilatation. There are areas of decreased attenuation within the white matter tracts of the supratentorial brain, consistent with microvascular disease changes. Vascular: No hyperdense  vessel or unexpected calcification. Skull: Normal. Negative for fracture or focal lesion. Sinuses/Orbits: There is moderate severity left maxillary sinus mucosal thickening. Other: None. IMPRESSION: 1. Generalized cerebral atrophy. 2. No acute intracranial abnormality. 3. Moderate severity left maxillary sinus disease. Electronically Signed   By: Aram Candelahaddeus  Houston M.D.   On: 01/01/2021 23:56   CT ABDOMEN PELVIS W CONTRAST  Result Date: 12/23/2020 CLINICAL DATA:  Sepsis possible rectal abscess EXAM: CT ABDOMEN AND PELVIS WITH CONTRAST TECHNIQUE: Multidetector CT imaging of the abdomen and pelvis was performed using the standard protocol following bolus administration of intravenous contrast. CONTRAST:  80mL OMNIPAQUE IOHEXOL 350 MG/ML SOLN COMPARISON:  CT 09/09/2020, 05/01/2020 FINDINGS: Lower chest: Lung bases demonstrate new airspace consolidation at the right greater than left lung base with mild bronchial wall thickening, suspicious for pneumonia or aspiration. No pleural effusion. Stable cardiac size. Hepatobiliary: Status post cholecystectomy. No intra hepatic biliary dilatation. Stable prominence of the common bile duct. Pancreas: Atrophic.  No inflammatory changes. Spleen: Normal in size without focal abnormality. Adrenals/Urinary Tract: Adrenal glands are normal. Atrophic native kidneys with vascular calcification. Right lower quadrant transplanted kidney with several small cysts. Mild right hydronephrosis. Mild urothelial thickening and possible narrowing at the ureteral implantation site at the anterior bladder, series 2, image 78. Stomach/Bowel: The stomach is nonenlarged. No dilated small bowel. Diverticular disease of the left colon without acute wall thickening. Circumferential wall thickening of the rectum with mild presacral soft tissue stranding. No organized perirectal abscess. Mild soft tissue infiltration of the gluteal soft tissues but without organized abscess. Vascular/Lymphatic: Moderate  aortic atherosclerosis. No aneurysm. No suspicious nodes Reproductive: Status post hysterectomy. No adnexal masses. Other: Negative for pelvic effusion or free air. Fat fluid level in the subcutaneous fat of the right abdominal wall. Soft tissue infiltration and calcifications of the subcutaneous fat of the anterior abdominal wall as before. Musculoskeletal: No acute osseous abnormality. Healing posterior rib fractures. Healing right first second third and fourth transverse process fractures. IMPRESSION: 1. Circumferential thickening of the rectum with presacral edema and mild soft tissue stranding suggestive of proctitis. No organized perirectal abscess. Correlate with direct inspection to exclude wall thickening secondary to neoplasm/mass. 2. Right lower quadrant transplant kidney with mild right hydronephrosis. Mild urothelial thickening and enhancement of the distal ureter with possible narrowing at implantation site of distal ureter. Similar appearance on CT from April 2022. Suggest correlation with urinalysis to exclude ascending urinary tract infection. 3. Patchy airspace disease at the right greater than left lung base suspicious for pneumonia or aspiration Electronically Signed   By: Jasmine PangKim  Fujinaga M.D.   On: 12/23/2020 22:25   DG Chest Port 1 View  Result Date: 01/01/2021 CLINICAL DATA:  Weakness and hypotension. EXAM: PORTABLE CHEST 1 VIEW COMPARISON:  December 23, 2020 FINDINGS: The heart size and mediastinal contours are within normal limits. Both lungs are clear. A chronic appearing deformity is seen  involving the distal right clavicle. IMPRESSION: No active disease. Electronically Signed   By: Aram Candela M.D.   On: 01/01/2021 23:39   DG Chest Port 1 View  Result Date: 12/23/2020 CLINICAL DATA:  Multiple status, code sepsis EXAM: PORTABLE CHEST 1 VIEW COMPARISON:  08/22/2020 FINDINGS: Lungs are clear.  No pleural effusion or pneumothorax. The heart is normal in size. IMPRESSION: No  evidence of acute cardiopulmonary disease. Electronically Signed   By: Charline Bills M.D.   On: 12/23/2020 19:43      Subjective: No new complaints.  Discharge Exam: Vitals:   01/13/21 2039 01/14/21 0545  BP: 135/60 119/63  Pulse: 68 (!) 54  Resp: 18 18  Temp: 99 F (37.2 C) 97.8 F (36.6 C)  SpO2: 99% 97%   Vitals:   01/13/21 0447 01/13/21 1241 01/13/21 2039 01/14/21 0545  BP: 119/63 129/62 135/60 119/63  Pulse: 65 69 68 (!) 54  Resp: 16 16 18 18   Temp: 98.5 F (36.9 C) 98.7 F (37.1 C) 99 F (37.2 C) 97.8 F (36.6 C)  TempSrc: Oral Oral Oral Oral  SpO2: 94% 96% 99% 97%  Weight:      Height:        General: Pt is alert, awake, not in acute distress Cardiovascular: RRR, S1/S2 +, no rubs, no gallops Respiratory: CTA bilaterally, no wheezing, no rhonchi Abdominal: Soft, NT, ND, bowel sounds + Extremities: no edema, no cyanosis    The results of significant diagnostics from this hospitalization (including imaging, microbiology, ancillary and laboratory) are listed below for reference.     Microbiology: Recent Results (from the past 240 hour(s))  Gastrointestinal Panel by PCR , Stool     Status: None   Collection Time: 01/05/21 10:04 AM   Specimen: Stool  Result Value Ref Range Status   Campylobacter species NOT DETECTED NOT DETECTED Final   Plesimonas shigelloides NOT DETECTED NOT DETECTED Final   Salmonella species NOT DETECTED NOT DETECTED Final   Yersinia enterocolitica NOT DETECTED NOT DETECTED Final   Vibrio species NOT DETECTED NOT DETECTED Final   Vibrio cholerae NOT DETECTED NOT DETECTED Final   Enteroaggregative E coli (EAEC) NOT DETECTED NOT DETECTED Final   Enteropathogenic E coli (EPEC) NOT DETECTED NOT DETECTED Final   Enterotoxigenic E coli (ETEC) NOT DETECTED NOT DETECTED Final   Shiga like toxin producing E coli (STEC) NOT DETECTED NOT DETECTED Final   Shigella/Enteroinvasive E coli (EIEC) NOT DETECTED NOT DETECTED Final   Cryptosporidium  NOT DETECTED NOT DETECTED Final   Cyclospora cayetanensis NOT DETECTED NOT DETECTED Final   Entamoeba histolytica NOT DETECTED NOT DETECTED Final   Giardia lamblia NOT DETECTED NOT DETECTED Final   Adenovirus F40/41 NOT DETECTED NOT DETECTED Final   Astrovirus NOT DETECTED NOT DETECTED Final   Norovirus GI/GII NOT DETECTED NOT DETECTED Final   Rotavirus A NOT DETECTED NOT DETECTED Final   Sapovirus (I, II, IV, and V) NOT DETECTED NOT DETECTED Final    Comment: Performed at [facility], [address] [place], [place] [address]     Labs: BNP (last 3 results) Recent Labs    03/27/20 0149 03/28/20 0241 03/29/20 0210  BNP 174.8* 124.5* 110.3*   Basic Metabolic Panel: Recent Labs  Lab 01/09/21 0342 01/10/21 0354 01/11/21 0515 01/12/21 0450 01/13/21 0437  NA 141 138 141 139 141  K 4.2 3.8 4.0 3.6 4.0  CL 105 105 104 108 107  CO2 28 27 27 25 27   GLUCOSE 293* 207* 174* 158*  241*  BUN 15 15 15 17 16   CREATININE 0.54 0.61 0.54 0.50 0.59  CALCIUM 10.2 9.9 10.0 9.5 9.9  MG 2.0 1.9 1.7 2.0 1.9   Liver Function Tests: No results for input(s): AST, ALT, ALKPHOS, BILITOT, PROT, ALBUMIN in the last 168 hours. No results for input(s): LIPASE, AMYLASE in the last 168 hours. No results for input(s): AMMONIA in the last 168 hours. CBC: Recent Labs  Lab 01/09/21 0342 01/10/21 0354 01/11/21 0515 01/12/21 0450 01/13/21 0437  WBC 7.0 8.5 8.4 7.7 6.4  NEUTROABS 4.4 5.6 5.3 4.6 4.3  HGB 12.4 12.1 12.2 12.6 12.2  HCT 38.0 37.3 38.5 39.4 38.8  MCV 79.3* 80.0 80.4 81.6 82.4  PLT 244 240 223 200 198   Cardiac Enzymes: No results for input(s): CKTOTAL, CKMB, CKMBINDEX, TROPONINI in the last 168 hours. BNP: Invalid input(s): POCBNP CBG: Recent Labs  Lab 01/13/21 1124 01/13/21 1656 01/13/21 2208 01/14/21 0732 01/14/21 1054  GLUCAP 153* 201* 184* 129* 107*   D-Dimer No results for input(s): DDIMER in the last 72 hours. Hgb A1c No results for input(s): HGBA1C in the  last 72 hours. Lipid Profile No results for input(s): CHOL, HDL, LDLCALC, TRIG, CHOLHDL, LDLDIRECT in the last 72 hours. Thyroid function studies No results for input(s): TSH, T4TOTAL, T3FREE, THYROIDAB in the last 72 hours.  Invalid input(s): FREET3 Anemia work up No results for input(s): VITAMINB12, FOLATE, FERRITIN, TIBC, IRON, RETICCTPCT in the last 72 hours. Urinalysis    Component Value Date/Time   COLORURINE YELLOW 01/01/2021 0620   APPEARANCEUR CLEAR 01/01/2021 0620   LABSPEC 1.010 01/01/2021 0620   PHURINE 7.0 01/01/2021 0620   GLUCOSEU NEGATIVE 01/01/2021 0620   HGBUR NEGATIVE 01/01/2021 0620   BILIRUBINUR NEGATIVE 01/01/2021 0620   KETONESUR NEGATIVE 01/01/2021 0620   PROTEINUR NEGATIVE 01/01/2021 0620   UROBILINOGEN 1.0 08/14/2012 1022   NITRITE NEGATIVE 01/01/2021 0620   LEUKOCYTESUR LARGE (A) 01/01/2021 0620   Sepsis Labs Invalid input(s): PROCALCITONIN,  WBC,  LACTICIDVEN Microbiology Recent Results (from the past 240 hour(s))  Gastrointestinal Panel by PCR , Stool     Status: None   Collection Time: 01/05/21 10:04 AM   Specimen: Stool  Result Value Ref Range Status   Campylobacter species NOT DETECTED NOT DETECTED Final   Plesimonas shigelloides NOT DETECTED NOT DETECTED Final   Salmonella species NOT DETECTED NOT DETECTED Final   Yersinia enterocolitica NOT DETECTED NOT DETECTED Final   Vibrio species NOT DETECTED NOT DETECTED Final   Vibrio cholerae NOT DETECTED NOT DETECTED Final   Enteroaggregative E coli (EAEC) NOT DETECTED NOT DETECTED Final   Enteropathogenic E coli (EPEC) NOT DETECTED NOT DETECTED Final   Enterotoxigenic E coli (ETEC) NOT DETECTED NOT DETECTED Final   Shiga like toxin producing E coli (STEC) NOT DETECTED NOT DETECTED Final   Shigella/Enteroinvasive E coli (EIEC) NOT DETECTED NOT DETECTED Final   Cryptosporidium NOT DETECTED NOT DETECTED Final   Cyclospora cayetanensis NOT DETECTED NOT DETECTED Final   Entamoeba histolytica NOT  DETECTED NOT DETECTED Final   Giardia lamblia NOT DETECTED NOT DETECTED Final   Adenovirus F40/41 NOT DETECTED NOT DETECTED Final   Astrovirus NOT DETECTED NOT DETECTED Final   Norovirus GI/GII NOT DETECTED NOT DETECTED Final   Rotavirus A NOT DETECTED NOT DETECTED Final   Sapovirus (I, II, IV, and V) NOT DETECTED NOT DETECTED Final    Comment: Performed at [facility], [address], [place], [place]     Time coordinating discharge: 18  minutes.   SIGNED:   Kathlen Mody, MD  [facility] 01/14/2021, 1:09 PM

## 2021-01-14 NOTE — Progress Notes (Signed)
Patient's IV was removed and she was cleaned up before discharge to [facility].

## 2021-01-14 NOTE — TOC Transition Note (Signed)
Transition of Care [facility]) - CM/SW Discharge Note  Patient Details  Name: Emma Prevette MRN: [id number] Date of Birth: August 07, 1950  [facility]) CM/SW Contact:  Ewing Schlein, LCSW Phone Number: 01/14/2021, 2:22 PM  Clinical Narrative: CSW provided bed offers to daughter and daughter requested CSW contact [facility]. Per French Ana at [facility], patient can be accepted today. PASRR number provided to [facility]. Discharge summary, discharge orders, and SNF transfer report faxed to facility in hub. COVID test is negative.  Patient will go to room 304 bed B and the number for report is [phone] ext [phone] or ext [phone]. Medical necessity form done; PTAR scheduled. Discharge packet completed. RN updated. TOC signing off.  Final next level of care: Skilled Nursing Facility Barriers to Discharge: Barriers Resolved   Patient Goals and CMS Choice Patient states their goals for this hospitalization and ongoing recovery are:: Go to [facility] for rehab CMS Medicare.gov Compare Post Acute Care list provided to:: Patient Represenative (must comment) Choice offered to / list presented to : Adult Children  Discharge Placement   Existing PASRR number confirmed : 01/14/21          Patient chooses bed at: [facility], [facility] Patient to be transferred to facility by: PTAR Name of family member notified: [name] (daughter) Patient and family notified of of transfer: 01/14/21  Discharge Plan and Services   Discharge Planning Services: CM Consult            DME Arranged: N/A DME Agency: NA                  Social Determinants of Health (SDOH) Interventions     Readmission Risk Interventions Readmission Risk Prevention Plan 01/03/2021  Transportation Screening Complete  HRI or Home Care Consult Complete  Social Work Consult for Recovery Care Planning/Counseling Complete  Palliative Care Screening Not Applicable  Medication Review Oceanographer) Complete  Some  recent data might be hidden

## 2021-04-09 ENCOUNTER — Encounter (HOSPITAL_COMMUNITY): Payer: Self-pay | Admitting: Radiology

## 2022-02-23 ENCOUNTER — Inpatient Hospital Stay (HOSPITAL_COMMUNITY)
Admission: EM | Admit: 2022-02-23 | Discharge: 2022-02-26 | DRG: 871 | Disposition: A | Discharge status: Skilled Nursing Facility | Payer: Medicare Other | Source: Skilled Nursing Facility | Attending: Family Medicine | Admitting: Family Medicine

## 2022-02-23 ENCOUNTER — Encounter (HOSPITAL_COMMUNITY): Payer: Self-pay | Admitting: *Deleted

## 2022-02-23 ENCOUNTER — Emergency Department (HOSPITAL_COMMUNITY): Payer: Medicare Other

## 2022-02-23 ENCOUNTER — Other Ambulatory Visit: Payer: Self-pay

## 2022-02-23 DIAGNOSIS — N189 Chronic kidney disease, unspecified: Secondary | ICD-10-CM | POA: Diagnosis not present

## 2022-02-23 DIAGNOSIS — N182 Chronic kidney disease, stage 2 (mild): Secondary | ICD-10-CM | POA: Diagnosis present

## 2022-02-23 DIAGNOSIS — E1122 Type 2 diabetes mellitus with diabetic chronic kidney disease: Secondary | ICD-10-CM | POA: Diagnosis present

## 2022-02-23 DIAGNOSIS — E785 Hyperlipidemia, unspecified: Secondary | ICD-10-CM | POA: Diagnosis present

## 2022-02-23 DIAGNOSIS — E119 Type 2 diabetes mellitus without complications: Secondary | ICD-10-CM

## 2022-02-23 DIAGNOSIS — Z79899 Other long term (current) drug therapy: Secondary | ICD-10-CM | POA: Diagnosis not present

## 2022-02-23 DIAGNOSIS — G9340 Encephalopathy, unspecified: Principal | ICD-10-CM

## 2022-02-23 DIAGNOSIS — E782 Mixed hyperlipidemia: Secondary | ICD-10-CM | POA: Diagnosis not present

## 2022-02-23 DIAGNOSIS — R6520 Severe sepsis without septic shock: Secondary | ICD-10-CM | POA: Diagnosis present

## 2022-02-23 DIAGNOSIS — Z1152 Encounter for screening for COVID-19: Secondary | ICD-10-CM | POA: Diagnosis not present

## 2022-02-23 DIAGNOSIS — Z79890 Hormone replacement therapy (postmenopausal): Secondary | ICD-10-CM

## 2022-02-23 DIAGNOSIS — N179 Acute kidney failure, unspecified: Secondary | ICD-10-CM | POA: Diagnosis present

## 2022-02-23 DIAGNOSIS — F0280 Dementia in other diseases classified elsewhere without behavioral disturbance: Secondary | ICD-10-CM | POA: Diagnosis present

## 2022-02-23 DIAGNOSIS — Z794 Long term (current) use of insulin: Secondary | ICD-10-CM

## 2022-02-23 DIAGNOSIS — A419 Sepsis, unspecified organism: Secondary | ICD-10-CM

## 2022-02-23 DIAGNOSIS — Z66 Do not resuscitate: Secondary | ICD-10-CM | POA: Diagnosis present

## 2022-02-23 DIAGNOSIS — N3001 Acute cystitis with hematuria: Secondary | ICD-10-CM | POA: Diagnosis present

## 2022-02-23 DIAGNOSIS — E872 Acidosis, unspecified: Secondary | ICD-10-CM | POA: Diagnosis present

## 2022-02-23 DIAGNOSIS — G3183 Dementia with Lewy bodies: Secondary | ICD-10-CM | POA: Diagnosis present

## 2022-02-23 DIAGNOSIS — I10 Essential (primary) hypertension: Secondary | ICD-10-CM | POA: Diagnosis not present

## 2022-02-23 DIAGNOSIS — G9341 Metabolic encephalopathy: Secondary | ICD-10-CM | POA: Diagnosis not present

## 2022-02-23 DIAGNOSIS — I951 Orthostatic hypotension: Secondary | ICD-10-CM | POA: Diagnosis not present

## 2022-02-23 DIAGNOSIS — E11649 Type 2 diabetes mellitus with hypoglycemia without coma: Secondary | ICD-10-CM | POA: Diagnosis present

## 2022-02-23 DIAGNOSIS — D631 Anemia in chronic kidney disease: Secondary | ICD-10-CM | POA: Diagnosis present

## 2022-02-23 DIAGNOSIS — E039 Hypothyroidism, unspecified: Secondary | ICD-10-CM | POA: Diagnosis present

## 2022-02-23 DIAGNOSIS — T8619 Other complication of kidney transplant: Secondary | ICD-10-CM | POA: Diagnosis present

## 2022-02-23 DIAGNOSIS — L89151 Pressure ulcer of sacral region, stage 1: Secondary | ICD-10-CM | POA: Diagnosis present

## 2022-02-23 DIAGNOSIS — Z833 Family history of diabetes mellitus: Secondary | ICD-10-CM

## 2022-02-23 DIAGNOSIS — Z79621 Long term (current) use of calcineurin inhibitor: Secondary | ICD-10-CM

## 2022-02-23 DIAGNOSIS — I129 Hypertensive chronic kidney disease with stage 1 through stage 4 chronic kidney disease, or unspecified chronic kidney disease: Secondary | ICD-10-CM | POA: Diagnosis present

## 2022-02-23 DIAGNOSIS — D6959 Other secondary thrombocytopenia: Secondary | ICD-10-CM | POA: Diagnosis present

## 2022-02-23 DIAGNOSIS — Z940 Kidney transplant status: Secondary | ICD-10-CM | POA: Diagnosis not present

## 2022-02-23 DIAGNOSIS — Y830 Surgical operation with transplant of whole organ as the cause of abnormal reaction of the patient, or of later complication, without mention of misadventure at the time of the procedure: Secondary | ICD-10-CM | POA: Diagnosis present

## 2022-02-23 DIAGNOSIS — R0902 Hypoxemia: Secondary | ICD-10-CM | POA: Diagnosis present

## 2022-02-23 DIAGNOSIS — I959 Hypotension, unspecified: Secondary | ICD-10-CM | POA: Diagnosis present

## 2022-02-23 DIAGNOSIS — F03918 Unspecified dementia, unspecified severity, with other behavioral disturbance: Secondary | ICD-10-CM | POA: Diagnosis present

## 2022-02-23 DIAGNOSIS — A4151 Sepsis due to Escherichia coli [E. coli]: Principal | ICD-10-CM | POA: Diagnosis present

## 2022-02-23 DIAGNOSIS — F0390 Unspecified dementia without behavioral disturbance: Secondary | ICD-10-CM | POA: Diagnosis present

## 2022-02-23 DIAGNOSIS — Z800 Family history of malignant neoplasm of digestive organs: Secondary | ICD-10-CM

## 2022-02-23 DIAGNOSIS — Z7952 Long term (current) use of systemic steroids: Secondary | ICD-10-CM

## 2022-02-23 DIAGNOSIS — N390 Urinary tract infection, site not specified: Secondary | ICD-10-CM | POA: Diagnosis not present

## 2022-02-23 LAB — CBC WITH DIFFERENTIAL/PLATELET
Abs Immature Granulocytes: 0.28 10*3/uL — ABNORMAL HIGH (ref 0.00–0.07)
Basophils Absolute: 0.1 10*3/uL (ref 0.0–0.1)
Basophils Relative: 0 %
Eosinophils Absolute: 0 10*3/uL (ref 0.0–0.5)
Eosinophils Relative: 0 %
HCT: 38.5 % (ref 36.0–46.0)
Hemoglobin: 12.4 g/dL (ref 12.0–15.0)
Immature Granulocytes: 1 %
Lymphocytes Relative: 5 %
Lymphs Abs: 1.3 10*3/uL (ref 0.7–4.0)
MCH: 26.3 pg (ref 26.0–34.0)
MCHC: 32.2 g/dL (ref 30.0–36.0)
MCV: 81.7 fL (ref 80.0–100.0)
Monocytes Absolute: 1.1 10*3/uL — ABNORMAL HIGH (ref 0.1–1.0)
Monocytes Relative: 5 %
Neutro Abs: 21.2 10*3/uL — ABNORMAL HIGH (ref 1.7–7.7)
Neutrophils Relative %: 89 %
Platelets: 130 10*3/uL — ABNORMAL LOW (ref 150–400)
RBC: 4.71 MIL/uL (ref 3.87–5.11)
RDW: 14.3 % (ref 11.5–15.5)
WBC: 23.9 10*3/uL — ABNORMAL HIGH (ref 4.0–10.5)
nRBC: 0 % (ref 0.0–0.2)

## 2022-02-23 LAB — URINALYSIS, ROUTINE W REFLEX MICROSCOPIC
Bilirubin Urine: NEGATIVE
Glucose, UA: NEGATIVE mg/dL
Ketones, ur: NEGATIVE mg/dL
Nitrite: NEGATIVE
Protein, ur: 100 mg/dL — AB
Specific Gravity, Urine: 1.025 (ref 1.005–1.030)
WBC, UA: >50 WBC/hpf (ref 0–5)
pH: 6 (ref 5.0–8.0)

## 2022-02-23 LAB — RESP PANEL BY RT-PCR (RSV, FLU A&B, COVID)  RVPGX2
Influenza A by PCR: NEGATIVE
Influenza B by PCR: NEGATIVE
Resp Syncytial Virus by PCR: NEGATIVE
SARS Coronavirus 2 by RT PCR: NEGATIVE

## 2022-02-23 LAB — LACTIC ACID, PLASMA
Lactic Acid, Venous: 2.2 mmol/L (ref 0.5–1.9)
Lactic Acid, Venous: 1.4 mmol/L (ref 0.5–1.9)
Lactic Acid, Venous: 0.9 mmol/L (ref 0.5–1.9)

## 2022-02-23 LAB — COMPREHENSIVE METABOLIC PANEL
ALT: 29 U/L (ref 0–44)
AST: 28 U/L (ref 15–41)
Albumin: 3.1 g/dL — ABNORMAL LOW (ref 3.5–5.0)
Alkaline Phosphatase: 86 U/L (ref 38–126)
Anion gap: 8 (ref 5–15)
BUN: 18 mg/dL (ref 8–23)
CO2: 24 mmol/L (ref 22–32)
Calcium: 9.1 mg/dL (ref 8.9–10.3)
Chloride: 106 mmol/L (ref 98–111)
Creatinine, Ser: 1.22 mg/dL — ABNORMAL HIGH (ref 0.44–1.00)
GFR, Estimated: 47 mL/min — ABNORMAL LOW (ref >60)
Glucose, Bld: 178 mg/dL — ABNORMAL HIGH (ref 70–99)
Potassium: 3.7 mmol/L (ref 3.5–5.1)
Sodium: 138 mmol/L (ref 135–145)
Total Bilirubin: 1.3 mg/dL — ABNORMAL HIGH (ref 0.3–1.2)
Total Protein: 5.9 g/dL — ABNORMAL LOW (ref 6.5–8.1)

## 2022-02-23 LAB — CBG MONITORING, ED: Glucose-Capillary: 168 mg/dL — ABNORMAL HIGH (ref 70–99)

## 2022-02-23 LAB — PROTIME-INR
INR: 1.1 (ref 0.8–1.2)
Prothrombin Time: 14.5 seconds (ref 11.4–15.2)

## 2022-02-23 LAB — APTT: aPTT: 30 seconds (ref 24–36)

## 2022-02-23 LAB — GLUCOSE, CAPILLARY
Glucose-Capillary: 48 mg/dL — ABNORMAL LOW (ref 70–99)
Glucose-Capillary: 127 mg/dL — ABNORMAL HIGH (ref 70–99)

## 2022-02-23 LAB — AMMONIA: Ammonia: <10 umol/L (ref 9–35)

## 2022-02-23 MED ORDER — LEVOTHYROXINE SODIUM 100 MCG/5ML IV SOLN: 75.0000 ug | Freq: Every day | INTRAVENOUS | Status: DC

## 2022-02-23 MED ORDER — INSULIN GLARGINE-YFGN 100 UNIT/ML ~~LOC~~ SOLN: 15.0000 [IU] | Freq: Every day | SUBCUTANEOUS | Status: DC

## 2022-02-23 MED ORDER — INSULIN ASPART 100 UNIT/ML IJ SOLN: 0.0000 [IU] | Freq: Three times a day (TID) | INTRAMUSCULAR | Status: DC

## 2022-02-23 MED ORDER — DEXTROSE 50 % IV SOLN
INTRAVENOUS | Status: AC
  Administered 2022-02-23: 25 mL
  Filled 2022-02-23: qty 50

## 2022-02-23 MED ORDER — SODIUM CHLORIDE 0.9 % IV SOLN: 1.0000 g | Freq: Once | INTRAVENOUS | Status: DC

## 2022-02-23 MED ORDER — ENOXAPARIN SODIUM 40 MG/0.4ML IJ SOSY
40.0000 mg | PREFILLED_SYRINGE | INTRAMUSCULAR | Status: DC
  Administered 2022-02-24 – 2022-02-25 (×2): 40 mg via SUBCUTANEOUS
  Filled 2022-02-23 (×2): qty 0.4

## 2022-02-23 MED ORDER — LACTATED RINGERS IV SOLN: INTRAVENOUS | Status: DC

## 2022-02-23 MED ORDER — LACTATED RINGERS IV BOLUS
1000.0000 mL | Freq: Once | INTRAVENOUS | Status: AC
  Administered 2022-02-23: 1000 mL via INTRAVENOUS

## 2022-02-23 MED ORDER — ONDANSETRON HCL 4 MG PO TABS: 4.0000 mg | ORAL_TABLET | Freq: Four times a day (QID) | ORAL | Status: DC | PRN

## 2022-02-23 MED ORDER — ACETAMINOPHEN 325 MG PO TABS
650.0000 mg | ORAL_TABLET | Freq: Four times a day (QID) | ORAL | Status: DC | PRN
  Administered 2022-02-24: 650 mg via ORAL
  Filled 2022-02-23: qty 2

## 2022-02-23 MED ORDER — SODIUM CHLORIDE 0.9 % IV SOLN
2.0000 g | INTRAVENOUS | Status: DC
  Administered 2022-02-23 – 2022-02-24 (×2): 2 g via INTRAVENOUS
  Filled 2022-02-23 (×2): qty 20

## 2022-02-23 MED ORDER — ONDANSETRON HCL 4 MG/2ML IJ SOLN: 4.0000 mg | Freq: Four times a day (QID) | INTRAMUSCULAR | Status: DC | PRN

## 2022-02-23 MED ORDER — ACETAMINOPHEN 650 MG RE SUPP
650.0000 mg | Freq: Four times a day (QID) | RECTAL | Status: DC | PRN
  Administered 2022-02-24: 650 mg via RECTAL
  Filled 2022-02-23: qty 1

## 2022-02-23 MED ORDER — SODIUM CHLORIDE 0.9 % IV SOLN
500.0000 mg | INTRAVENOUS | Status: DC
  Administered 2022-02-23 – 2022-02-24 (×2): 500 mg via INTRAVENOUS
  Filled 2022-02-23 (×2): qty 5

## 2022-02-23 MED ORDER — ACETAMINOPHEN 325 MG PO TABS
650.0000 mg | ORAL_TABLET | Freq: Once | ORAL | Status: AC
  Administered 2022-02-23: 650 mg via ORAL
  Filled 2022-02-23: qty 2

## 2022-02-23 NOTE — ED Notes (Signed)
ED TO INPATIENT HANDOFF REPORT  Name/Age/Gender Emma Maldonado 72 y.o. female  Code Status Code Status History     Date Active Date Inactive Code Status Order ID Comments User Context   01/02/2021 1147 01/15/2021 0258 Partial Code [id number]  Jonnie Finner, DO ED   12/24/2020 0137 12/30/2020 1635 Full Code [id number]  Lenore Cordia, MD ED   08/22/2020 1534 09/02/2020 2300 Full Code [id number]  Mckinley Jewel, MD ED   05/01/2020 1559 05/07/2020 0001 Full Code [id number]  Mckinley Jewel, MD ED   03/25/2020 0604 03/29/2020 1842 Full Code [id number]  Chotiner, Yevonne Aline, MD ED   03/26/2012 0120 03/28/2012 1503 Full Code [id number]  Rise Patience, MD Inpatient    Questions for Most Recent Historical Code Status (Order [id number])     Question Answer   In the event of cardiac or respiratory ARREST: Initiate Code Blue, Call Rapid Response Yes   In the event of cardiac or respiratory ARREST: Perform CPR Yes   In the event of cardiac or respiratory ARREST: Perform Intubation/Mechanical Ventilation No   In the event of cardiac or respiratory ARREST: Use NIPPV/BiPAp only if indicated Yes   In the event of cardiac or respiratory ARREST: Administer ACLS medications if indicated Yes   In the event of cardiac or respiratory ARREST: Perform Defibrillation or Cardioversion if indicated Yes   Comments DNI confirmed w/ dtr (HCPOA)            Home/SNF/Other Skilled nursing facility  Chief Complaint Sepsis due to urinary tract infection ([place]) [A41.9, N39.0]  Level of Care/Admitting Diagnosis ED Disposition     ED Disposition  Admit   Condition  --   [place]: [facility] [[id number]]  Level of Care: Progressive [102]  Admit to Progressive based on following criteria: MULTISYSTEM THREATS such as stable sepsis, metabolic/electrolyte imbalance with or without encephalopathy that is responding to early treatment.  May [facility] patient to [facility] or [facility] if equivalent level of care is available:: Yes  Covid Evaluation: Confirmed COVID Negative  Diagnosis: Sepsis due to urinary tract infection [facility]) [id number]  Admitting Physician: Lyndee Hensen [id number]  Attending Physician: Lyndee Hensen [id number]  Certification:: I certify this patient will need inpatient services for at least 2 midnights  Estimated Length of Stay: 2          Medical History Past Medical History:  Diagnosis Date   Dementia ([place])    Diabetes mellitus without complication ([place])    Hypertension    Renal disorder    Renal insufficiency     Allergies No Known Allergies  IV Location/Drains/Wounds Patient Lines/Drains/Airways Status     Active Line/Drains/Airways     Name Placement date Placement time Site Days   Peripheral IV 02/23/22 20 G 1" Left Antecubital 02/23/22  1323  Antecubital  less than 1   Urethral Catheter Nelia Shi, RN Double-lumen 16 Fr. 01/08/21  1229  Double-lumen  411   Pressure Injury 01/03/21 Sacrum Posterior Stage 2 -  Partial thickness loss of dermis presenting as a shallow open injury with a red, pink wound bed without slough. 01/03/21  0100  -- 416            Labs/Imaging Results for orders placed or performed during the hospital encounter of 02/23/22 (from the past 48 hour(s))  CBG monitoring, ED     Status: Abnormal   Collection Time: 02/23/22 12:52 PM  Result Value Ref Range  Glucose-Capillary 168 (H) 70 - 99 mg/dL    Comment: Glucose reference range applies only to samples taken after fasting for at least 8 hours.  Comprehensive metabolic panel     Status: Abnormal   Collection Time: 02/23/22  1:10 PM  Result Value Ref Range   Sodium 138 135 - 145 mmol/L   Potassium 3.7 3.5 - 5.1 mmol/L   Chloride 106 98 - 111 mmol/L   CO2 24 22 - 32 mmol/L   Glucose, Bld 178 (H) 70 - 99 mg/dL    Comment: Glucose reference range applies only to samples taken after fasting for at least 8 hours.   BUN 18 8 - 23 mg/dL    Creatinine, Ser 1.22 (H) 0.44 - 1.00 mg/dL   Calcium 9.1 8.9 - 10.3 mg/dL   Total Protein 5.9 (L) 6.5 - 8.1 g/dL   Albumin 3.1 (L) 3.5 - 5.0 g/dL   AST 28 15 - 41 U/L   ALT 29 0 - 44 U/L   Alkaline Phosphatase 86 38 - 126 U/L   Total Bilirubin 1.3 (H) 0.3 - 1.2 mg/dL   GFR, Estimated 47 (L) >60 mL/min    Comment: (NOTE) Calculated using the CKD-EPI Creatinine Equation (2021)    Anion gap 8 5 - 15    Comment: Performed at [facility], [place] [address], [place], [place] [address]  Lactic acid, plasma     Status: Abnormal   Collection Time: 02/23/22  1:10 PM  Result Value Ref Range   Lactic Acid, Venous 2.2 (HH) 0.5 - 1.9 mmol/L    Comment: CRITICAL RESULT CALLED TO, READ BACK BY AND VERIFIED WITH BLAIR,I. RN AT 1402 02/23/22 MULLINS,T Performed at [facility], [place] [address] [place], [place] [address]   CBC with Differential     Status: Abnormal   Collection Time: 02/23/22  1:10 PM  Result Value Ref Range   WBC 23.9 (H) 4.0 - 10.5 K/uL   RBC 4.71 3.87 - 5.11 MIL/uL   Hemoglobin 12.4 12.0 - 15.0 g/dL   HCT 38.5 36.0 - 46.0 %   MCV 81.7 80.0 - 100.0 fL   MCH 26.3 26.0 - 34.0 pg   MCHC 32.2 30.0 - 36.0 g/dL   RDW 14.3 11.5 - 15.5 %   Platelets 130 (L) 150 - 400 K/uL    Comment: SPECIMEN CHECKED FOR CLOTS REPEATED TO VERIFY    nRBC 0.0 0.0 - 0.2 %   Neutrophils Relative % 89 %   Neutro Abs 21.2 (H) 1.7 - 7.7 K/uL   Lymphocytes Relative 5 %   Lymphs Abs 1.3 0.7 - 4.0 K/uL   Monocytes Relative 5 %   Monocytes Absolute 1.1 (H) 0.1 - 1.0 K/uL   Eosinophils Relative 0 %   Eosinophils Absolute 0.0 0.0 - 0.5 K/uL   Basophils Relative 0 %   Basophils Absolute 0.1 0.0 - 0.1 K/uL   Immature Granulocytes 1 %   Abs Immature Granulocytes 0.28 (H) 0.00 - 0.07 K/uL    Comment: Performed at [facility], [place] [address] [place], [place] [address]  Protime-INR     Status: None   Collection Time: 02/23/22  1:10 PM  Result Value  Ref Range   Prothrombin Time 14.5 11.4 - 15.2 seconds   INR 1.1 0.8 - 1.2    Comment: (NOTE) INR goal varies based on device and disease states. Performed at [facility], [place] [address], [place], [place] [address]  Ammonia     Status: None   Collection Time: 02/23/22  1:10 PM  Result Value Ref Range   Ammonia <10 9 - 35 umol/L    Comment: Performed at [facility], [place] [address], [place], [place] [address]  APTT     Status: None   Collection Time: 02/23/22  1:10 PM  Result Value Ref Range   aPTT 30 24 - 36 seconds    Comment: Performed at [facility], [place] [address] [place], [place] [address]  Urinalysis, Routine w reflex microscopic -Urine, Random     Status: Abnormal   Collection Time: 02/23/22  1:15 PM  Result Value Ref Range   Color, Urine YELLOW YELLOW   APPearance TURBID (A) CLEAR   Specific Gravity, Urine 1.025 1.005 - 1.030   pH 6.0 5.0 - 8.0   Glucose, UA NEGATIVE NEGATIVE mg/dL   Hgb urine dipstick MODERATE (A) NEGATIVE   Bilirubin Urine NEGATIVE NEGATIVE   Ketones, ur NEGATIVE NEGATIVE mg/dL   Protein, ur 100 (A) NEGATIVE mg/dL   Nitrite NEGATIVE NEGATIVE   Leukocytes,Ua LARGE (A) NEGATIVE   RBC / HPF 21-50 0 - 5 RBC/hpf   WBC, UA >50 0 - 5 WBC/hpf   Bacteria, UA MANY (A) NONE SEEN   Squamous Epithelial / HPF 0-5 0 - 5 /HPF   WBC Clumps PRESENT    Mucus PRESENT     Comment: Performed at [facility], [place] [address], [place],  [address]  Resp panel by RT-PCR (RSV, Flu A&B, Covid) Anterior Nasal Swab     Status: None   Collection Time: 02/23/22  1:15 PM   Specimen: Anterior Nasal Swab  Result Value Ref Range   SARS Coronavirus 2 by RT PCR NEGATIVE NEGATIVE    Comment: (NOTE) SARS-CoV-2 target nucleic acids are NOT DETECTED.  The SARS-CoV-2 RNA is generally detectable in upper respiratory specimens during the acute phase of infection. The lowest concentration of SARS-CoV-2  viral copies this assay can detect is 138 copies/mL. A negative result does not preclude SARS-Cov-2 infection and should not be used as the sole basis for treatment or other patient management decisions. A negative result may occur with  improper specimen collection/handling, submission of specimen other than nasopharyngeal swab, presence of viral mutation(s) within the areas targeted by this assay, and inadequate number of viral copies(<138 copies/mL). A negative result must be combined with clinical observations, patient history, and epidemiological information. The expected result is Negative.  Fact Sheet for Patients:  EntrepreneurPulse.com.au  Fact Sheet for Healthcare Providers:  IncredibleEmployment.be  This test is no t yet approved or cleared by the [place] FDA and  has been authorized for detection and/or diagnosis of SARS-CoV-2 by FDA under an Emergency Use Authorization (EUA). This EUA will remain  in effect (meaning this test can be used) for the duration of the COVID-19 declaration under Section 564(b)(1) of the Act, 21 U.S.C.section 360bbb-3(b)(1), unless the authorization is terminated  or revoked sooner.       Influenza A by PCR NEGATIVE NEGATIVE   Influenza B by PCR NEGATIVE NEGATIVE    Comment: (NOTE) The Xpert Xpress SARS-CoV-2/FLU/RSV plus assay is intended as an aid in the diagnosis of influenza from Nasopharyngeal swab specimens and should not be used as a sole basis for treatment. Nasal washings and aspirates are unacceptable for Xpert Xpress SARS-CoV-2/FLU/RSV testing.  Fact Sheet for Patients: EntrepreneurPulse.com.au  Fact Sheet for Healthcare Providers: IncredibleEmployment.be  This test is not  yet approved or cleared by the [place] and has been authorized for detection and/or diagnosis of SARS-CoV-2 by FDA under an Emergency Use Authorization (EUA). This EUA  will remain in effect (meaning this test can be used) for the duration of the COVID-19 declaration under Section 564(b)(1) of the Act, 21 U.S.C. section 360bbb-3(b)(1), unless the authorization is terminated or revoked.     Resp Syncytial Virus by PCR NEGATIVE NEGATIVE    Comment: (NOTE) Fact Sheet for Patients: EntrepreneurPulse.com.au  Fact Sheet for Healthcare Providers: IncredibleEmployment.be  This test is not yet approved or cleared by the [place] FDA and has been authorized for detection and/or diagnosis of SARS-CoV-2 by FDA under an Emergency Use Authorization (EUA). This EUA will remain in effect (meaning this test can be used) for the duration of the COVID-19 declaration under Section 564(b)(1) of the Act, 21 U.S.C. section 360bbb-3(b)(1), unless the authorization is terminated or revoked.  Performed at [facility], [place] [address] [place], [place] [address]    CT Head Wo Contrast  Result Date: 02/23/2022 CLINICAL DATA:  Altered mental status over the last 2 weeks. Delirium. EXAM: CT HEAD WITHOUT CONTRAST TECHNIQUE: Contiguous axial images were obtained from the base of the skull through the vertex without intravenous contrast. RADIATION DOSE REDUCTION: This exam was performed according to the departmental dose-optimization program which includes automated exposure control, adjustment of the mA and/or kV according to patient size and/or use of iterative reconstruction technique. COMPARISON:  01/01/2021 FINDINGS: Brain: Age related volume loss. No evidence of old or acute focal infarction, mass lesion, hemorrhage, hydrocephalus or extra-axial collection. Vascular: There is atherosclerotic calcification of the major vessels at the base of the brain. Skull: Negative Sinuses/Orbits: No evidence of acute sinus inflammation. Minimal chronic mucoperiosteal thickening of the right maxillary sinus. Orbits appear normal. Other:  None IMPRESSION: No acute or significant CT finding. Age related volume loss. Atherosclerotic calcification of the major vessels at the base of the brain. Electronically Signed   By: Nelson Chimes M.D.   On: 02/23/2022 13:44   DG Chest 2 View  Result Date: 02/23/2022 CLINICAL DATA:  Suspected sepsis EXAM: CHEST - 2 VIEW COMPARISON:  01/01/2021 FINDINGS: Artifact overlies the chest. Heart size is normal. Patient has taken a poor inspiration. Allowing for that, the lungs are felt to be clear. Evidence of heart failure or effusion. IMPRESSION: Poor inspiration. No active disease suspected. Electronically Signed   By: Nelson Chimes M.D.   On: 02/23/2022 13:42    Pending Labs Unresulted Labs (From admission, onward)     Start     Ordered   02/23/22 1840  Culture, Urine (Do not remove urinary catheter, catheter placed by urology or difficult to place)  (Culture Panel)  Once,   URGENT       Question:  Indication  Answer:  Dysuria   02/23/22 1839   02/23/22 1822  Lactic acid, plasma  Now then every 2 hours,   R (with STAT occurrences)      02/23/22 1821   02/23/22 1248  Lactic acid, plasma  Now then every 2 hours,   R (with STAT occurrences)      02/23/22 1248   02/23/22 1248  Culture, blood (Routine x 2)  BLOOD CULTURE X 2,   R (with STAT occurrences)      02/23/22 1248            Vitals/Pain Today's Vitals   02/23/22 1415 02/23/22 1545 02/23/22 1700 02/23/22 1800  BP: (!) 132/50 132/65 138/67 (!) 115/59  Pulse: 95 92 95 95  Resp: (!) 22 (!) 23 20 (!) 21  Temp:   100 F (37.8 C)   TempSrc:      SpO2: 100% 100% 99% 100%  PainSc:        Isolation Precautions No active isolations  Medications Medications  lactated ringers infusion ( Intravenous New Bag/Given 02/23/22 1350)  cefTRIAXone (ROCEPHIN) 2 g in sodium chloride 0.9 % 100 mL IVPB (0 g Intravenous Stopped 02/23/22 1420)  azithromycin (ZITHROMAX) 500 mg in sodium chloride 0.9 % 250 mL IVPB (0 mg Intravenous Stopped 02/23/22 1545)   acetaminophen (TYLENOL) tablet 650 mg (650 mg Oral Given 02/23/22 1722)  lactated ringers bolus 1,000 mL (1,000 mLs Intravenous New Bag/Given 02/23/22 1649)    Mobility non-ambulatory

## 2022-02-23 NOTE — ED Triage Notes (Signed)
BIB [facility] From [facility]. Pt has had AMS X 2 week. Pt not responding verbally. Tried with language line and no response. Pt will follow you with her eyes, was note to be hypoxia in 70s upon EMS arrival and Hypotensive. # 20 IV rt ac with bolus by EMS. 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

## 2022-02-23 NOTE — ED Provider Notes (Signed)
Patient with urosepsis.  She will be admitted to medicine   [name], MD 02/23/22 [name]

## 2022-02-23 NOTE — ED Provider Notes (Signed)
[facility] Provider Note  CSN: [id number] Arrival date & time: 02/23/22 1221  Chief Complaint(s) Altered Mental Status  HPI Tonesha Langsam is a 72 y.o. female with past medical history as below, significant for diabetes, nephrectomy, hypertension, renal insufficiency, nursing home resident/[facility] who presents to the ED with complaint of ams.  Per family/nursing, patient has been acting abnormally for around 2 weeks.  Patient with difficulty breathing today, she was hypoxic in the mid 70s per EMS, placed on supplemental oxygen with improvement, reduced responsiveness.  She was given IV fluids prior to arrival and her mental status did improve precipitously.  Patient is refusing to speak with interpreter but is able to communicate somewhat on her own.  Reports some difficulty breathing, coughing, denies any fevers or chills no belly pain, no change in bowel or bladder function.  Will 5 caveat dementia  [facility] Dr Carlota Raspberry Renal transplant 1997, [facility]  Past Medical History Past Medical History:  Diagnosis Date   Dementia ([place])    Diabetes mellitus without complication ([place])    Hypertension    Renal disorder    Renal insufficiency    Patient Active Problem List   Diagnosis Date Noted   Acute lower UTI    Chronic diarrhea 01/05/2021   Pressure injury of skin 01/03/2021   Sepsis ([place]) 01/03/2021   Acute urinary retention 01/03/2021   Bacteremia    Severe sepsis ([place]) 12/23/2020   Hypothyroidism 12/23/2020   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   Hypomagnesemia    Acute renal failure (HCC)    Hypotension 08/22/2020   Hydronephrosis of right kidney 05/01/2020   Rib fractures 05/01/2020   UTI (urinary tract infection) 03/25/2020   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 Authorizing Provider  acetaminophen (TYLENOL) 500 MG tablet Take 500 mg by mouth every 6 (six) hours as needed for fever or headache.    [provider]  alum & mag hydroxide-simeth (MAALOX/MYLANTA) 200-200-20 MG/5ML suspension Take 30 mLs by mouth every 6 (six) hours as needed for indigestion or heartburn.    [provider]  Cholecalciferol (VITAMIN D3) 50 MCG (2000 UT) TABS Take 2,000 Units by mouth daily.    [provider]  fludrocortisone (FLORINEF) 0.1 MG tablet Take 0.1 mg by mouth at bedtime.     [provider]  hydrocortisone (CORTENEMA) 100 MG/60ML enema Place 1 enema (100 mg total) rectally at bedtime for 14 days. 12/30/20 01/13/21  Dessa Phi, DO  insulin aspart (NOVOLOG) 100 UNIT/ML injection CBG 70 - 120: 0 units  CBG 121 - 150: 2 units  CBG 151 - 200: 3 units  CBG 201 - 250: 5 units  CBG 251 - 300: 8 units  CBG 301 - 350: 11 units  CBG 351 - 400: 15 units 01/14/21   Hosie Poisson, MD  LANTUS SOLOSTAR 100 UNIT/ML Solostar Pen Inject 20 Units into the skin in the morning. Patient taking differently: Inject 24 Units into the skin in the morning. 08/26/20   Wendee Beavers  T, MD  levothyroxine (SYNTHROID) 100 MCG tablet Take 100 mcg by mouth daily before breakfast. 03/04/20   [provider]  loperamide (IMODIUM) 2 MG capsule Take 2 mg by mouth as needed (with each loose stool for diarrhea- cannot exceed 8 doses a day). 02/20/20   [provider]  losartan (COZAAR) 25 MG tablet Take 1 tablet (25 mg total) by mouth daily. 01/15/21   Hosie Poisson, MD  magnesium hydroxide (MILK OF  MAGNESIA) 400 MG/5ML suspension Take 30 mLs by mouth at bedtime as needed for mild constipation.    [provider]  memantine (NAMENDA) 10 MG tablet Take 10 mg by mouth every 12 (twelve) hours.    [provider]  Multiple Vitamin (MULTIVITAMIN WITH MINERALS) TABS tablet Take 1 tablet by mouth daily. 08/26/20   Mercy Riding, MD  neomycin-bacitracin-polymyxin (NEOSPORIN) [phone] ointment Apply 1 application topically as needed (for minor skin tears or abrasions, after cleaning with normal saline- bandage with gauze and tape).    [provider]  Nystatin (GERHARDT'S BUTT CREAM) CREA Apply 1 application topically 3 (three) times daily. 01/14/21   Hosie Poisson, MD  predniSONE (DELTASONE) 5 MG tablet Take 5 mg by mouth every morning.    [provider]  QUEtiapine (SEROQUEL) 25 MG tablet Take 25 mg by mouth at bedtime.    [provider]  rosuvastatin (CRESTOR) 5 MG tablet Take 5 mg by mouth at bedtime. 12/01/19   [provider]  tacrolimus (PROGRAF) 0.5 MG capsule Take 1 capsule (0.5 mg total) by mouth daily. 03/28/12   Louellen Molder, MD                                                                                                                                    Past Surgical History Past Surgical History:  Procedure Laterality Date   ABDOMINAL HYSTERECTOMY     BIOPSY  12/28/2020   Procedure: BIOPSY;  Surgeon: Otis Brace, MD;  Location: [facility];  Service: Gastroenterology;;   CHOLECYSTECTOMY     FLEXIBLE SIGMOIDOSCOPY N/A 12/28/2020   Procedure: FLEXIBLE SIGMOIDOSCOPY;  Surgeon: Otis Brace, MD;  Location: [facility];  Service: Gastroenterology;  Laterality: N/A;   NEPHRECTOMY TRANSPLANTED ORGAN     Family History Family History  Problem Relation Age of Onset   Diabetes Mellitus II Father    Colon cancer Father    Colon cancer Brother     Social History Social History   Tobacco Use   Smoking status:  Never   Smokeless tobacco: Never  Substance Use Topics   Alcohol use: No   Drug use: No   Allergies Patient has no known allergies.  Review of Systems Review of Systems  Unable to perform ROS: Dementia    Physical Exam Vital Signs  I have reviewed the triage vital signs BP (!) 132/50   Pulse 95   Temp (!) 101 F (38.3 C) (Rectal)  Resp (!) 22   SpO2 100%  Physical Exam Vitals and nursing note reviewed. Exam conducted with a chaperone present.  Constitutional:      General: She is not in acute distress.    Appearance: Normal appearance.  HENT:     Head: Normocephalic and atraumatic. No raccoon eyes, Battle's sign, right periorbital erythema or left periorbital erythema.     Right Ear: External ear normal.     Left Ear: External ear normal.     Nose: Nose normal.     Mouth/Throat:     Mouth: Mucous membranes are dry.  Eyes:     General: No scleral icterus.       Right eye: No discharge.        Left eye: No discharge.  Cardiovascular:     Rate and Rhythm: Normal rate and regular rhythm.     Pulses: Normal pulses.     Heart sounds: Normal heart sounds.  Pulmonary:     Effort: Pulmonary effort is normal. No respiratory distress.     Breath sounds: Normal breath sounds.  Abdominal:     General: Abdomen is flat.     Tenderness: There is no abdominal tenderness.  Musculoskeletal:     Cervical back: No rigidity.     Right lower leg: No edema.     Left lower leg: No edema.  Skin:    General: Skin is warm and dry.     Capillary Refill: Capillary refill takes less than 2 seconds.  Neurological:     Mental Status: She is alert. She is disoriented.     GCS: GCS eye subscore is 4. GCS verbal subscore is 5. GCS motor subscore is 6.     Cranial Nerves: No facial asymmetry.     Motor: No tremor.     Comments: Variable compliance with neuro testing Moving upper extremities spontaneously, extremities will cross midline Response to noxious stimulus in all 4 extremities   Psychiatric:        Mood and Affect: Mood normal.        Behavior: Behavior normal.     ED Results and Treatments Labs (all labs ordered are listed, but only abnormal results are displayed) Labs Reviewed  COMPREHENSIVE METABOLIC PANEL - Abnormal; Notable for the following components:      Result Value   Glucose, Bld 178 (*)    Creatinine, Ser 1.22 (*)    Total Protein 5.9 (*)    Albumin 3.1 (*)    Total Bilirubin 1.3 (*)    GFR, Estimated 47 (*)    All other components within normal limits  LACTIC ACID, PLASMA - Abnormal; Notable for the following components:   Lactic Acid, Venous 2.2 (*)    All other components within normal limits  CBC WITH DIFFERENTIAL/PLATELET - Abnormal; Notable for the following components:   WBC 23.9 (*)    Platelets 130 (*)    Neutro Abs 21.2 (*)    Monocytes Absolute 1.1 (*)    Abs Immature Granulocytes 0.28 (*)    All other components within normal limits  CBG MONITORING, ED - Abnormal; Notable for the following components:   Glucose-Capillary 168 (*)    All other components within normal limits  RESP PANEL BY RT-PCR (RSV, FLU A&B, COVID)  RVPGX2  CULTURE, BLOOD (ROUTINE X 2)  CULTURE, BLOOD (ROUTINE X 2)  PROTIME-INR  AMMONIA  APTT  LACTIC ACID, PLASMA  URINALYSIS, ROUTINE W REFLEX MICROSCOPIC  Radiology CT Head Wo Contrast  Result Date: 02/23/2022 CLINICAL DATA:  Altered mental status over the last 2 weeks. Delirium. EXAM: CT HEAD WITHOUT CONTRAST TECHNIQUE: Contiguous axial images were obtained from the base of the skull through the vertex without intravenous contrast. RADIATION DOSE REDUCTION: This exam was performed according to the departmental dose-optimization program which includes automated exposure control, adjustment of the mA and/or kV according to patient size and/or use of iterative reconstruction technique.  COMPARISON:  01/01/2021 FINDINGS: Brain: Age related volume loss. No evidence of old or acute focal infarction, mass lesion, hemorrhage, hydrocephalus or extra-axial collection. Vascular: There is atherosclerotic calcification of the major vessels at the base of the brain. Skull: Negative Sinuses/Orbits: No evidence of acute sinus inflammation. Minimal chronic mucoperiosteal thickening of the right maxillary sinus. Orbits appear normal. Other: None IMPRESSION: No acute or significant CT finding. Age related volume loss. Atherosclerotic calcification of the major vessels at the base of the brain. Electronically Signed   By: Nelson Chimes M.D.   On: 02/23/2022 13:44   DG Chest 2 View  Result Date: 02/23/2022 CLINICAL DATA:  Suspected sepsis EXAM: CHEST - 2 VIEW COMPARISON:  01/01/2021 FINDINGS: Artifact overlies the chest. Heart size is normal. Patient has taken a poor inspiration. Allowing for that, the lungs are felt to be clear. Evidence of heart failure or effusion. IMPRESSION: Poor inspiration. No active disease suspected. Electronically Signed   By: Nelson Chimes M.D.   On: 02/23/2022 13:42    Pertinent labs & imaging results that were available during my care of the patient were reviewed by me and considered in my medical decision making (see MDM for details).  Medications Ordered in ED Medications  lactated ringers infusion ( Intravenous New Bag/Given 02/23/22 1350)  cefTRIAXone (ROCEPHIN) 2 g in sodium chloride 0.9 % 100 mL IVPB (2 g Intravenous New Bag/Given 02/23/22 1350)  azithromycin (ZITHROMAX) 500 mg in sodium chloride 0.9 % 250 mL IVPB (has no administration in time range)  acetaminophen (TYLENOL) tablet 650 mg (has no administration in time range)  lactated ringers bolus 1,000 mL (has no administration in time range)                                                                                                                                     Procedures .Critical Care  Performed  by: Jeanell Sparrow, DO Authorized by: Jeanell Sparrow, DO   Critical care provider statement:    Critical care time (minutes):  50   Critical care time was exclusive of:  Separately billable procedures and treating other patients   Critical care was necessary to treat or prevent imminent or life-threatening deterioration of the following conditions:  Sepsis   Critical care was time spent personally by me on the following activities:  Development of treatment plan with patient or surrogate, discussions with consultants, evaluation of patient's response to treatment, examination of patient, ordering  and review of laboratory studies, ordering and review of radiographic studies, ordering and performing treatments and interventions, pulse oximetry, re-evaluation of patient's condition, review of old charts and obtaining history from patient or surrogate   (including critical care time)  Medical Decision Making / ED Course    Medical Decision Making:    Dovey Willner is a 72 y.o. female with past medical history as below, significant for diabetes, hypertension, renal insufficiency, nursing home resident/[facility] who presents to the ED with complaint of ams. . The complaint involves an extensive differential diagnosis and also carries with it a high risk of complications and morbidity.  Serious etiology was considered. Ddx includes but is not limited to: Differential diagnosis for adult fever includes but is not exclusive to community-acquired pneumonia, urinary tract infection, acute cholecystitis, viral syndrome, cellulitis, tick bourne disease,  decubitus ulcer, necrotizing fasciitis, meningitis, encephalitis, influenza, etc. In my evaluation of this patient's dyspnea my DDx includes, but is not limited to, pneumonia, pulmonary embolism, pneumothorax, pulmonary edema, metabolic acidosis, asthma, COPD, cardiac cause, anemia, anxiety, etc.     Complete initial physical exam performed, notably the  patient  was breathing comfortably on ambient air, not currently hypoxic on room air.  Febrile on arrival..    Reviewed and confirmed nursing documentation for past medical history, family history, social history.  Vital signs reviewed.      Patient febrile on arrival, tachypneic, concern for possible Rester infection.  Meets bacteria for sepsis.  Code sepsis was alerted.  Blood culture sent.  Patient was given fluid bolus by EMS, will start fluid infusion.  Give empiric Rocephin azithromycin for possible pulmonary infection  Labs reviewed, she has leukocytosis 23.9 with left shift, creatinine is increased from baseline today 1.22, baseline is 0.5-0.6  Lactic acid is mildly elevated 2.2, continue IV fluids.  Blood pressure stable, Rester status is stable.  Does not appear to be septic shock.  CT head unremarkable, chest x-ray is also unremarkable.  UA pending  Pt signed out to incoming [name] pending UA. If this is negative consider CT chest/abd/pelvis for further evaluation of possible source of infection.   Pt is HDS, breathing comfortably on ambient air.     Additional history obtained: -Additional history obtained from family and ems -External records from outside source obtained and reviewed including: Chart review including previous notes, labs, imaging, consultation notes including prior ED visits, prior primary care documentation, home medications, prior labs and imaging   Lab Tests: -I ordered, reviewed, and interpreted labs.   The pertinent results include:   Labs Reviewed  COMPREHENSIVE METABOLIC PANEL - Abnormal; Notable for the following components:      Result Value   Glucose, Bld 178 (*)    Creatinine, Ser 1.22 (*)    Total Protein 5.9 (*)    Albumin 3.1 (*)    Total Bilirubin 1.3 (*)    GFR, Estimated 47 (*)    All other components within normal limits  LACTIC ACID, PLASMA - Abnormal; Notable for the following components:   Lactic Acid, Venous 2.2 (*)    All  other components within normal limits  CBC WITH DIFFERENTIAL/PLATELET - Abnormal; Notable for the following components:   WBC 23.9 (*)    Platelets 130 (*)    Neutro Abs 21.2 (*)    Monocytes Absolute 1.1 (*)    Abs Immature Granulocytes 0.28 (*)    All other components within normal limits  CBG MONITORING, ED - Abnormal; Notable for the following components:  Glucose-Capillary 168 (*)    All other components within normal limits  RESP PANEL BY RT-PCR (RSV, FLU A&B, COVID)  RVPGX2  CULTURE, BLOOD (ROUTINE X 2)  CULTURE, BLOOD (ROUTINE X 2)  PROTIME-INR  AMMONIA  APTT  LACTIC ACID, PLASMA  URINALYSIS, ROUTINE W REFLEX MICROSCOPIC    Notable for as above  EKG   EKG Interpretation  Date/Time:  Monday February 23 2022 12:45:12 EST Ventricular Rate:  93 PR Interval:  108 QRS Duration: 86 QT Interval:  371 QTC Calculation: 462 R Axis:   57 Text Interpretation: Sinus rhythm Short PR interval Confirmed by Wynona Dove ([id number]) on 02/23/2022 3:36:35 PM         Imaging Studies ordered: I ordered imaging studies including CXR CTH I independently visualized the following imaging with scope of interpretation limited to determining acute life threatening conditions related to emergency care: CXR/CTH wnl I independently visualized and interpreted imaging. I agree with the radiologist interpretation   Medicines ordered and prescription drug management: Meds ordered this encounter  Medications   DISCONTD: cefTRIAXone (ROCEPHIN) 1 g in sodium chloride 0.9 % 100 mL IVPB    Order Specific Question:   Antibiotic Indication:    Answer:   Other Indication (list below)    Order Specific Question:   Other Indication:    Answer:   sepsis, ?pna   lactated ringers infusion   cefTRIAXone (ROCEPHIN) 2 g in sodium chloride 0.9 % 100 mL IVPB    Order Specific Question:   Antibiotic Indication:    Answer:   CAP   azithromycin (ZITHROMAX) 500 mg in sodium chloride 0.9 % 250 mL IVPB    Order  Specific Question:   Antibiotic Indication:    Answer:   CAP   acetaminophen (TYLENOL) tablet 650 mg   lactated ringers bolus 1,000 mL    -I have reviewed the patients home medicines and have made adjustments as needed   Consultations Obtained: na   Cardiac Monitoring: The patient was maintained on a cardiac monitor.  I personally viewed and interpreted the cardiac monitored which showed an underlying rhythm of: NSR  Social Determinants of Health:  Diagnosis or treatment significantly limited by social determinants of health: dementia, nursing home  Reevaluation: After the interventions noted above, I reevaluated the patient and found that they have improved  Co morbidities that complicate the patient evaluation  Past Medical History:  Diagnosis Date   Dementia ([place])    Diabetes mellitus without complication ([place])    Hypertension    Renal disorder    Renal insufficiency       Dispostion: Disposition decision including need for hospitalization was considered, and patient dispo pending but anticipate admission.     Final Clinical Impression(s) / ED Diagnoses Final diagnoses:  Encephalopathy  Sepsis, due to unspecified organism, unspecified whether acute organ dysfunction present [facility])     This chart was dictated using voice recognition software.  Despite best efforts to proofread,  errors can occur which can change the documentation meaning.    Jeanell Sparrow, DO 02/23/22 1538

## 2022-02-23 NOTE — Progress Notes (Signed)
[facility] will follow per sepsis protocol

## 2022-02-23 NOTE — H&P (Signed)
History and Physical    Patient: Emma Maldonado DOB: Jun 28, 1950 DOA: 02/23/2022 DOS: the patient was seen and examined on 02/24/2022 PCP: Patient, No Pcp Per  Patient coming from: SNF ([facility])  Chief Complaint:  Chief Complaint  Patient presents with   Altered Mental Status      HPI:  Level 5 caveat for dementia  Emma Maldonado is a 72 y.o. female with medical history significant of type 2 diabetes, hyperlipidemia, hypertension, renal transplant in 1997 (follows with [facility]), orthostatic hypotension (fludrocortisone) and advanced Lewy body dementia who presented from the skilled nursing facility due to altered mental status.  Attempted to communicate via Spanish interpreter however patient unable to provide history due to acute encephalopathy versus dementia.  History obtained from ED provider, chart review and nursing staff.  Nursing facility staff reported patient has not been acting like herself for the past 2 weeks.  Today she had an episode of hypoxia with EMS and placed on oxygen.  Apparently she spoke with the ED provider and reported some coughing and difficulty breathing.    She was febrile, tachypneic with soft diastolic blood pressures.  Initial CBG 168. SCr elevated from baseline.     Review of Systems: unable to review all systems due to the inability of the patient to answer questions. Level 5 caveat for dementia   Past Medical History:  Diagnosis Date   Dementia ([place])    Diabetes mellitus without complication ([place])    Hypertension    Renal disorder    Renal insufficiency    Past Surgical History:  Procedure Laterality Date   ABDOMINAL HYSTERECTOMY     BIOPSY  12/28/2020   Procedure: BIOPSY;  Surgeon: Otis Brace, MD;  Location: [facility] ENDOSCOPY;  Service: Gastroenterology;;   CHOLECYSTECTOMY     FLEXIBLE SIGMOIDOSCOPY N/A 12/28/2020   Procedure: FLEXIBLE SIGMOIDOSCOPY;  Surgeon: Otis Brace, MD;  Location: [facility]  ENDOSCOPY;  Service: Gastroenterology;  Laterality: N/A;   NEPHRECTOMY TRANSPLANTED ORGAN     Social History:  reports that she has never smoked. She has never used smokeless tobacco. She reports that she does not drink alcohol and does not use drugs.  No Known Allergies  Family History  Problem Relation Age of Onset   Diabetes Mellitus II Father    Colon cancer Father    Colon cancer Brother     Prior to Admission medications   Medication Sig Start Date End Date Taking? Authorizing Provider  acetaminophen (TYLENOL) 500 MG tablet Take 500 mg by mouth every 6 (six) hours as needed (for pain).   Yes [provider]  amLODipine (NORVASC) 5 MG tablet Take 5 mg by mouth daily.   Yes [provider]  bethanechol (URECHOLINE) 25 MG tablet Take 25 mg by mouth 3 (three) times daily.   Yes [provider]  cefTRIAXone (ROCEPHIN) 2 g injection Inject 2 g into the muscle daily. 02/23/22 03/01/22 Yes [provider]  Cholecalciferol (VITAMIN D3) 125 MCG (5000 UT) TABS Take 5,000 Units by mouth daily.   Yes [provider]  cholestyramine (QUESTRAN) 4 g packet Take 4 g by mouth See admin instructions. Mix 4 grams as directed and take 2 times a day- should be taken two hours before or after any other medications   Yes [provider]  fludrocortisone (FLORINEF) 0.1 MG tablet Take 0.2 mg by mouth at bedtime.   Yes [provider]  HUMALOG 100 UNIT/ML injection Inject 0-10 Units into the skin See admin  instructions. Inject 0-10 units into the skin three times a day before meals AND at bedtime, per sliding scale: BGL 0-149 = give nothing; 150-200 = 2 units; 201-250 = 4 units; 251-300 = 6 units; 301-350 = 8 units; 351-400 = 10 units; 400-1,000 = 10 units and CALL MD!   Yes [provider]  Infant Care Products [facility]) OINT Apply 1 application  topically See admin instructions. Apply to groin/buttocks 3 times a day and after each episode  of incontinence   Yes [provider]  LANTUS SOLOSTAR 100 UNIT/ML Solostar Pen Inject 20 Units into the skin in the morning. Patient taking differently: Inject 8-60 Units into the skin See admin instructions. Inject 60 units into the skin in the morning and 8 units at bedtime 08/26/20  Yes Mercy Riding, MD  levothyroxine (SYNTHROID) 100 MCG tablet Take 100 mcg by mouth daily before breakfast. 03/04/20  Yes [provider]  loperamide (IMODIUM) 2 MG capsule Take 2 mg by mouth every 4 (four) hours as needed (for diarrhea). 02/20/20  Yes [provider]  losartan (COZAAR) 100 MG tablet Take 100 mg by mouth in the morning.   Yes [provider]  magnesium hydroxide (MILK OF MAGNESIA) 400 MG/5ML suspension Take 30 mLs by mouth daily as needed (for no B/M after 3 days).   Yes [provider]  memantine (NAMENDA) 10 MG tablet Take 10 mg by mouth every 12 (twelve) hours.   Yes [provider]  Multiple Vitamin (MULTIVITAMIN WITH MINERALS) TABS tablet Take 1 tablet by mouth daily. 08/26/20  Yes Mercy Riding, MD  predniSONE (DELTASONE) 5 MG tablet Take 5 mg by mouth every morning.   Yes [provider]  rosuvastatin (CRESTOR) 5 MG tablet Take 5 mg by mouth at bedtime. 12/01/19  Yes [provider]  tacrolimus (PROGRAF) 0.5 MG capsule Take 1 capsule (0.5 mg total) by mouth daily. 03/28/12  Yes Dhungel, Nishant, MD  hydrocortisone (CORTENEMA) 100 MG/60ML enema Place 1 enema (100 mg total) rectally at bedtime for 14 days. Patient not taking: Reported on 02/23/2022 12/30/20 02/23/22  Dessa Phi, DO  insulin aspart (NOVOLOG) 100 UNIT/ML injection CBG 70 - 120: 0 units  CBG 121 - 150: 2 units  CBG 151 - 200: 3 units  CBG 201 - 250: 5 units  CBG 251 - 300: 8 units  CBG 301 - 350: 11 units  CBG 351 - 400: 15 units Patient not taking: Reported on 02/23/2022 01/14/21   Hosie Poisson, MD  losartan (COZAAR) 25 MG tablet Take 1 tablet (25 mg total)  by mouth daily. Patient not taking: Reported on 02/23/2022 01/15/21   Hosie Poisson, MD  neomycin-bacitracin-polymyxin (NEOSPORIN) [phone] ointment Apply 1 application topically as needed (for minor skin tears or abrasions, after cleaning with normal saline- bandage with gauze and tape). Patient not taking: Reported on 02/23/2022    [provider]  Nystatin (GERHARDT'S BUTT CREAM) CREA Apply 1 application topically 3 (three) times daily. Patient not taking: Reported on 02/23/2022 01/14/21   Hosie Poisson, MD    Physical Exam: Vitals:   02/23/22 1415 02/23/22 1545 02/23/22 1700 02/23/22 1800  BP: (!) 132/50 132/65 138/67 (!) 115/59  Pulse: 95 92 95 95  Resp: (!) 22 (!) 23 20 (!) 21  Temp:   100 F (37.8 C)   TempSrc:      SpO2: 100% 100% 99% 100%   GEN:     alert, elderly female resting in bed and in  no distress    HENT:   atraumatic, mucus membranes tachy, no nasal discharge  EYES:   no conjunctival injection, opens eyes spontaneously, no discharge NECK:  supple, no lymphadenopathy  RESP:  clear to auscultation bilaterally, no increased work of breathing  CVS:   regular rate and rhythm, distal pulses intact   ABD:  soft, non-tender; bowel sounds present; no palpable masses EXT:   atraumatic, no edema, non-tender with palpable  NEURO:  non-responsive to commands, awakens to movement Skin:   warm and dry, no visible wounds   Data Reviewed: Glucose 168 > 178 Cr: 1.22  Lactic acid 2.2 > 1.4 > 0.9  Ammonia < 10  UA: +hgb, +protein, +leukUa, +WBCs, + RBCs COVID, influenza, RSV negative  Assessment and Plan: * Sepsis due to urinary tract infection ([place]) Chest x-ray did not show any pneumonia.  COVID, influenza and RSV negative.  Significant leukocytosis with left shift, WBC 23.  Lactic acidosis resolved with IV hydration.  Blood cultures obtained.  Urinalysis showing evidence of acute cystitis with hematuria.  I suspect this is her source.  She had poor inspiratory effort on  her chest x-ray.  Given ceftriaxone and azithromycin in the emergency department as patient told the ED provider that she had some cough and difficulty breathing. Continue ceftriaxone and azithromycin for now Follow-up blood cultures Status post 1 L LR bolus and on 150 mL/hr maintenance IV fluids LR AM procalcitonin Consider repeat chest x-ray  Acute kidney injury superimposed on chronic kidney disease ([place]) History of renal transplant.  Her baseline appears to be around 0.55.  On admission serum creatinine 1.22. -Monitor with BMP -Avoid nephrotoxic agents -Renally dose medications as needed  Hypotension History of orthostatic hypotension.  Holding home fludrocortisone while NPO.  Restart when needed.   Dementia without behavioral disturbance ([place]) Has advanced Lewy body dementia. Appears to be still disoriented.  On chart review, patient is not a great historian at baseline.    Insulin dependent type 2 diabetes mellitus (H[facility]) Initial CBG 168.  Follow-up A1C -Patient had an episode of hypoglycemia (48). Given amplue of d50 with recheck 73 two hours later.  Started on 150 mL/hr D5LR.  -Hold insulin for now   Hyperlipidemia Restart home medications when able  History of renal transplant Follows with [facility].  Takes prednisone and tacrolimus. -Restart home medications when able  Hypertension-resolved as of [id number] Had diastolic hypotension on admission.  Holding home antihypertensive in the setting of sepsis on fluid resuscitation. -Restart if needed   There are notes that patient had a DNR living will in place in 2021.  As I cannot confirm this with family this evening.  Please revisit her CODE STATUS in the morning.     Advance Care Planning:   Code Status: Full Code   Consults: None  Family Communication: None  Severity of Illness: The appropriate patient status for this patient is INPATIENT. Inpatient status is judged to be reasonable and necessary in order to  provide the required intensity of service to ensure the patient's safety. The patient's presenting symptoms, physical exam findings, and initial radiographic and laboratory data in the context of their chronic comorbidities is felt to place them at high risk for further clinical deterioration. Furthermore, it is not anticipated that the patient will be medically stable for discharge from the hospital within 2 midnights of admission.   * I certify that at the point of admission it is my clinical judgment that the patient will require inpatient  hospital care spanning beyond 2 midnights from the point of admission due to high intensity of service, high risk for further deterioration and high frequency of surveillance required.*  Author: Lyndee Hensen, DO 02/24/2022 3:24 AM  For on call review [email or web address].

## 2022-02-23 NOTE — ED Notes (Signed)
Pt back from CT

## 2022-02-24 ENCOUNTER — Encounter (HOSPITAL_COMMUNITY): Payer: Self-pay | Admitting: Family Medicine

## 2022-02-24 ENCOUNTER — Inpatient Hospital Stay (HOSPITAL_COMMUNITY): Payer: Medicare Other

## 2022-02-24 DIAGNOSIS — N390 Urinary tract infection, site not specified: Secondary | ICD-10-CM | POA: Diagnosis not present

## 2022-02-24 DIAGNOSIS — N179 Acute kidney failure, unspecified: Secondary | ICD-10-CM

## 2022-02-24 DIAGNOSIS — A419 Sepsis, unspecified organism: Secondary | ICD-10-CM

## 2022-02-24 DIAGNOSIS — G9341 Metabolic encephalopathy: Secondary | ICD-10-CM | POA: Diagnosis not present

## 2022-02-24 LAB — CBC
HCT: 37.2 % (ref 36.0–46.0)
Hemoglobin: 11.7 g/dL — ABNORMAL LOW (ref 12.0–15.0)
MCH: 26.2 pg (ref 26.0–34.0)
MCHC: 31.5 g/dL (ref 30.0–36.0)
MCV: 83.4 fL (ref 80.0–100.0)
Platelets: 98 10*3/uL — ABNORMAL LOW (ref 150–400)
RBC: 4.46 MIL/uL (ref 3.87–5.11)
RDW: 14.6 % (ref 11.5–15.5)
WBC: 17.6 10*3/uL — ABNORMAL HIGH (ref 4.0–10.5)
nRBC: 0 % (ref 0.0–0.2)

## 2022-02-24 LAB — GLUCOSE, CAPILLARY
Glucose-Capillary: 111 mg/dL — ABNORMAL HIGH (ref 70–99)
Glucose-Capillary: 157 mg/dL — ABNORMAL HIGH (ref 70–99)
Glucose-Capillary: 171 mg/dL — ABNORMAL HIGH (ref 70–99)
Glucose-Capillary: 172 mg/dL — ABNORMAL HIGH (ref 70–99)
Glucose-Capillary: 203 mg/dL — ABNORMAL HIGH (ref 70–99)
Glucose-Capillary: 73 mg/dL (ref 70–99)
Glucose-Capillary: 86 mg/dL (ref 70–99)

## 2022-02-24 LAB — BASIC METABOLIC PANEL
Anion gap: 7 (ref 5–15)
BUN: 15 mg/dL (ref 8–23)
CO2: 21 mmol/L — ABNORMAL LOW (ref 22–32)
Calcium: 8.5 mg/dL — ABNORMAL LOW (ref 8.9–10.3)
Chloride: 112 mmol/L — ABNORMAL HIGH (ref 98–111)
Creatinine, Ser: 0.7 mg/dL (ref 0.44–1.00)
GFR, Estimated: >60 mL/min (ref >60)
Glucose, Bld: 128 mg/dL — ABNORMAL HIGH (ref 70–99)
Potassium: 3.7 mmol/L (ref 3.5–5.1)
Sodium: 140 mmol/L (ref 135–145)

## 2022-02-24 LAB — MRSA NEXT GEN BY PCR, NASAL: MRSA by PCR Next Gen: NOT DETECTED

## 2022-02-24 LAB — HEMOGLOBIN A1C
Hgb A1c MFr Bld: 8.9 % — ABNORMAL HIGH (ref 4.8–5.6)
Mean Plasma Glucose: 208.73 mg/dL

## 2022-02-24 LAB — PROCALCITONIN: Procalcitonin: 2.05 ng/mL

## 2022-02-24 MED ORDER — INSULIN ASPART 100 UNIT/ML IJ SOLN
0.0000 [IU] | Freq: Every day | INTRAMUSCULAR | Status: DC
  Administered 2022-02-25: 2 [IU] via SUBCUTANEOUS

## 2022-02-24 MED ORDER — INSULIN GLARGINE-YFGN 100 UNIT/ML ~~LOC~~ SOLN
30.0000 [IU] | Freq: Every day | SUBCUTANEOUS | Status: DC
  Administered 2022-02-25 (×2): 30 [IU] via SUBCUTANEOUS
  Filled 2022-02-24 (×4): qty 0.3

## 2022-02-24 MED ORDER — AMLODIPINE BESYLATE 10 MG PO TABS
5.0000 mg | ORAL_TABLET | Freq: Every day | ORAL | Status: DC
  Administered 2022-02-24 – 2022-02-26 (×3): 5 mg via ORAL
  Filled 2022-02-24 (×3): qty 1

## 2022-02-24 MED ORDER — LOSARTAN POTASSIUM 50 MG PO TABS
100.0000 mg | ORAL_TABLET | Freq: Every morning | ORAL | Status: DC
  Administered 2022-02-25 – 2022-02-26 (×2): 100 mg via ORAL
  Filled 2022-02-24 (×2): qty 2

## 2022-02-24 MED ORDER — VITAMIN D 25 MCG (1000 UNIT) PO TABS
5000.0000 [IU] | ORAL_TABLET | Freq: Every day | ORAL | Status: DC
  Administered 2022-02-24 – 2022-02-26 (×3): 5000 [IU] via ORAL
  Filled 2022-02-24 (×3): qty 5

## 2022-02-24 MED ORDER — MEMANTINE HCL 10 MG PO TABS
10.0000 mg | ORAL_TABLET | Freq: Two times a day (BID) | ORAL | Status: DC
  Administered 2022-02-24 – 2022-02-26 (×4): 10 mg via ORAL
  Filled 2022-02-24 (×4): qty 1

## 2022-02-24 MED ORDER — TACROLIMUS 0.5 MG PO CAPS
0.5000 mg | ORAL_CAPSULE | Freq: Every day | ORAL | Status: DC
  Administered 2022-02-24 – 2022-02-26 (×3): 0.5 mg via ORAL
  Filled 2022-02-24 (×3): qty 1

## 2022-02-24 MED ORDER — PREDNISONE 5 MG PO TABS
5.0000 mg | ORAL_TABLET | Freq: Every morning | ORAL | Status: DC
  Administered 2022-02-24 – 2022-02-26 (×3): 5 mg via ORAL
  Filled 2022-02-24 (×3): qty 1

## 2022-02-24 MED ORDER — INSULIN ASPART 100 UNIT/ML IJ SOLN
0.0000 [IU] | Freq: Three times a day (TID) | INTRAMUSCULAR | Status: DC
  Administered 2022-02-24: 3 [IU] via SUBCUTANEOUS
  Administered 2022-02-25 (×2): 2 [IU] via SUBCUTANEOUS
  Administered 2022-02-25: 1 [IU] via SUBCUTANEOUS
  Administered 2022-02-26: 3 [IU] via SUBCUTANEOUS

## 2022-02-24 MED ORDER — CHOLESTYRAMINE LIGHT 4 G PO PACK
4.0000 g | PACK | Freq: Two times a day (BID) | ORAL | Status: DC
  Administered 2022-02-24 – 2022-02-26 (×4): 4 g via ORAL
  Filled 2022-02-24 (×4): qty 1

## 2022-02-24 MED ORDER — SODIUM CHLORIDE 0.9 % IV SOLN
2.0000 g | INTRAVENOUS | Status: DC
  Administered 2022-02-25: 2 g via INTRAVENOUS
  Filled 2022-02-24: qty 20

## 2022-02-24 MED ORDER — DEXTROSE IN LACTATED RINGERS 5 % IV SOLN: INTRAVENOUS | Status: DC

## 2022-02-24 MED ORDER — FLUDROCORTISONE ACETATE 0.1 MG PO TABS
0.2000 mg | ORAL_TABLET | Freq: Every day | ORAL | Status: DC
  Administered 2022-02-24 – 2022-02-25 (×2): 0.2 mg via ORAL
  Filled 2022-02-24 (×3): qty 2

## 2022-02-24 MED ORDER — BETHANECHOL CHLORIDE 25 MG PO TABS
25.0000 mg | ORAL_TABLET | Freq: Three times a day (TID) | ORAL | Status: DC
  Administered 2022-02-24 – 2022-02-26 (×7): 25 mg via ORAL
  Filled 2022-02-24 (×7): qty 1

## 2022-02-24 MED ORDER — ROSUVASTATIN CALCIUM 5 MG PO TABS
5.0000 mg | ORAL_TABLET | Freq: Every day | ORAL | Status: DC
  Administered 2022-02-24 – 2022-02-25 (×2): 5 mg via ORAL
  Filled 2022-02-24 (×2): qty 1

## 2022-02-24 MED ORDER — ADULT MULTIVITAMIN W/MINERALS CH
1.0000 | ORAL_TABLET | Freq: Every day | ORAL | Status: DC
  Administered 2022-02-25 – 2022-02-26 (×2): 1 via ORAL
  Filled 2022-02-24 (×2): qty 1

## 2022-02-24 MED ORDER — LEVOTHYROXINE SODIUM 100 MCG PO TABS
100.0000 ug | ORAL_TABLET | Freq: Every day | ORAL | Status: DC
  Administered 2022-02-25 – 2022-02-26 (×2): 100 ug via ORAL
  Filled 2022-02-24 (×2): qty 1

## 2022-02-24 NOTE — Assessment & Plan Note (Signed)
History of renal transplant.  Her baseline appears to be around 0.55.  On admission serum creatinine 1.22. -Monitor with BMP -Avoid nephrotoxic agents -Renally dose medications as needed

## 2022-02-24 NOTE — Assessment & Plan Note (Signed)
Restart home medications when able

## 2022-02-24 NOTE — Assessment & Plan Note (Signed)
Initial CBG 168.  Follow-up A1C -Patient had an episode of hypoglycemia (48). Given amplue of d50 with recheck 73 two hours later.  Started on 150 mL/hr D5LR.  -Hold insulin for now

## 2022-02-24 NOTE — Assessment & Plan Note (Signed)
Had diastolic hypotension on admission.  Holding home antihypertensive in the setting of sepsis on fluid resuscitation. -Restart if needed

## 2022-02-24 NOTE — Evaluation (Addendum)
Physical Therapy Evaluation Patient Details Name: Emma Maldonado MRN: [id number] DOB: 1950-06-11 Today's Date: 02/24/2022  History of Present Illness  72 year old female who presented with AMS from SNF. Patient was noted to have some hypoxia in the emergency department. Patient was admitted with Sepsis, UTI, AKI, PMH: Lewy body dementia, DM II, hyperlipidemia, h/o renal transplant, orthostatic hypotension  Clinical Impression  On eval, pt required Min-Mod A for mobility. She did follow commands with repeated cueing and increased time. She was able to stand and take several side steps along the bedside with a RW. No family was present during session. Recommend return to facility (unsure if patient is from ALF or SNF) as long as staff can provide current level of care. Will plan to follow patient during this hospital stay.        Recommendations for follow up therapy are one component of a multi-disciplinary discharge planning process, led by the attending physician.  Recommendations may be updated based on patient status, additional functional criteria and insurance authorization.  Follow Up Recommendations  (return to facility as long as staff can provide current level of care)      Assistance Recommended at Discharge Frequent or constant Supervision/Assistance  Patient can return home with the following  A little help with walking and/or transfers;A little help with bathing/dressing/bathroom;Assistance with cooking/housework;Assist for transportation;Help with stairs or ramp for entrance    Equipment Recommendations None recommended by PT  Recommendations for Other Services       Functional Status Assessment Patient has had a recent decline in their functional status and demonstrates the ability to make significant improvements in function in a reasonable and predictable amount of time.     Precautions / Restrictions Precautions Precautions: Fall Precaution Comments: hx of  orthostatic hypotension Restrictions Weight Bearing Restrictions: No      Mobility  Bed Mobility Overal bed mobility: Needs Assistance Bed Mobility: Supine to Sit, Sit to Supine     Supine to sit: HOB elevated, Mod assist Sit to supine: Min assist   General bed mobility comments: Repeated multimodal cues required. Assist to scoot to EOB. Less assist for back to bed.    Transfers Overall transfer level: Needs assistance Equipment used: Rolling walker (2 wheels) Transfers: Sit to/from Stand Sit to Stand: Min assist           General transfer comment: Assist to rise, steady, control descent. Cues for safety, technique, hand placement.    Ambulation/Gait Ambulation/Gait assistance: Min assist   Assistive device: Rolling walker (2 wheels)         General Gait Details: Side steps along bedside with RW. Pt kept trying to sit back down prematurely. Repeated cueing required. Assist to staedy pt and manage RW.  Stairs            Wheelchair Mobility    Modified Rankin (Stroke Patients Only)       Balance Overall balance assessment: Needs assistance         Standing balance support: Bilateral upper extremity supported, Reliant on assistive device for balance, During functional activity Standing balance-Leahy Scale: Poor                               Pertinent Vitals/Pain Pain Assessment Pain Assessment: Faces Faces Pain Scale: Hurts a little bit Pain Location: "chest" Pain Intervention(s): Monitored during session, Limited activity within patient's tolerance, Repositioned    Home Living Family/patient expects to  be discharged to:: Unsure                   Additional Comments: patient is unreliable historian.    Prior Function Prior Level of Function : Needs assist             Mobility Comments: unreliable historian. she did state she uses a walker when asked during my session ADLs Comments: requires assistance     Hand  Dominance        Extremity/Trunk Assessment   Upper Extremity Assessment Upper Extremity Assessment: Defer to OT evaluation    Lower Extremity Assessment Lower Extremity Assessment: Generalized weakness    Cervical / Trunk Assessment Cervical / Trunk Assessment: Normal  Communication   Communication: Prefers language other than English (she does speak English intermittently. tends to shake head "yes" or no" mostly.)  Cognition Arousal/Alertness: Awake/alert Behavior During Therapy: Flat affect Overall Cognitive Status: Difficult to assess                                 General Comments: h/o dementia        General Comments      Exercises     Assessment/Plan    PT Assessment Patient needs continued PT services  PT Problem List Decreased activity tolerance;Decreased balance;Decreased mobility;Decreased cognition;Decreased strength       PT Treatment Interventions DME instruction;Gait training;Therapeutic exercise;Balance training;Functional mobility training;Therapeutic activities;Patient/family education    PT Goals (Current goals can be found in the Care Plan section)  Acute Rehab PT Goals Patient Stated Goal: none stated PT Goal Formulation: Patient unable to participate in goal setting Time For Goal Achievement: 03/10/22 Potential to Achieve Goals: Fair    Frequency Min 3X/week     Co-evaluation               AM-PAC PT "6 Clicks" Mobility  Outcome Measure Help needed turning from your back to your side while in a flat bed without using bedrails?: A Little Help needed moving from lying on your back to sitting on the side of a flat bed without using bedrails?: A Lot Help needed moving to and from a bed to a chair (including a wheelchair)?: A Little Help needed standing up from a chair using your arms (e.g., wheelchair or bedside chair)?: A Little Help needed to walk in hospital room?: A Lot Help needed climbing 3-5 steps with a  railing? : Total 6 Click Score: 14    End of Session Equipment Utilized During Treatment: Gait belt Activity Tolerance: Patient tolerated treatment well Patient left: in bed;with call bell/phone within reach;with bed alarm set Nurse Communication: Thru secure chat, asked RN to have NT check purewick on patient (it became dislodged during session)  PT Visit Diagnosis: Muscle weakness (generalized) (M62.81);Difficulty in walking, not elsewhere classified (R26.2)    Time: 1250-1301 PT Time Calculation (min) (ACUTE ONLY): 11 min   Charges:   PT Evaluation $PT Eval Low Complexity: [place], PT Acute Rehabilitation  Office: [phone]

## 2022-02-24 NOTE — Assessment & Plan Note (Signed)
Has advanced Lewy body dementia. Appears to be still disoriented.  On chart review, patient is not a great historian at baseline.

## 2022-02-24 NOTE — Assessment & Plan Note (Signed)
Chest x-ray did not show any pneumonia.  COVID, influenza and RSV negative.  Significant leukocytosis with left shift, WBC 23.  Lactic acidosis resolved with IV hydration.  Blood cultures obtained.  Urinalysis showing evidence of acute cystitis with hematuria.  I suspect this is her source.  She had poor inspiratory effort on her chest x-ray.  Given ceftriaxone and azithromycin in the emergency department as patient told the ED provider that she had some cough and difficulty breathing. Continue ceftriaxone and azithromycin for now Follow-up blood cultures Status post 1 L LR bolus and on 150 mL/hr maintenance IV fluids LR AM procalcitonin Consider repeat chest x-ray

## 2022-02-24 NOTE — Evaluation (Signed)
Occupational Therapy Evaluation Patient Details Name: Emma Maldonado MRN: [id number] DOB: Jul 30, 1950 Today's Date: 02/24/2022   History of Present Illness Patient is a 72 year old female who presented with AMS from SNF. Patient was noted to have some hypoxia in the emergency department. Patient was admitted with Sepsis, UTI, AKI, PMH: Lewy body dementia, DM II, hyperlipidemia, h/o renal transplant,   Clinical Impression   Patient evaluated by Occupational Therapy with no further acute OT needs identified. All education has been completed and the patient has no further questions. Patient is at her baseline with patient LTC resident at [place].  See below for any follow-up Occupational Therapy or equipment needs. OT is signing off. Thank you for this referral.       Recommendations for follow up therapy are one component of a multi-disciplinary discharge planning process, led by the attending physician.  Recommendations may be updated based on patient status, additional functional criteria and insurance authorization.   Follow Up Recommendations  No OT follow up     Assistance Recommended at Discharge Frequent or constant Supervision/Assistance  Patient can return home with the following A little help with walking and/or transfers;Assistance with cooking/housework;Direct supervision/assist for medications management;Assist for transportation;Help with stairs or ramp for entrance;Direct supervision/assist for financial management;A little help with bathing/dressing/bathroom    Functional Status Assessment  Patient has not had a recent decline in their functional status        Precautions / Restrictions Precautions Precautions: Fall Precaution Comments: hx of orthostatic hypotension Restrictions Weight Bearing Restrictions: No      Mobility Bed Mobility Overal bed mobility: Needs Assistance Bed Mobility: Supine to Sit     Supine to sit: Min assist     General bed mobility  comments: with cues for proper hand and foot placement          Balance Overall balance assessment: Mild deficits observed, not formally tested       ADL either performed or assessed with clinical judgement   ADL Overall ADL's : At baseline     General ADL Comments: Patient appears to be at her baseline for ADLs at this time. patient was min A for bed mobility, min A for inital sit to stand with leaning noted to R side.patient noted to be incontinent of urine and returned to sitting on bed at this time with NT called into room.  patient then upon second stand was supervision and then able to transition to bathroom with supervision for safety and line management.      Pertinent Vitals/Pain Pain Assessment Pain Assessment: No/denies pain        Extremity/Trunk Assessment Upper Extremity Assessment Upper Extremity Assessment: Defer to OT evaluation   Lower Extremity Assessment Lower Extremity Assessment: Generalized weakness   Cervical / Trunk Assessment Cervical / Trunk Assessment: Normal   Communication Communication Communication: Prefers language other than English (she does speak English intermittently. tends to shake head "yes" or no" mostly.)   Cognition Arousal/Alertness: Awake/alert Behavior During Therapy: Flat affect Overall Cognitive Status: Difficult to assess     General Comments: h/o dementia, did not answer questions about time and date.                Home Living Family/patient expects to be discharged to:: Unsure       Additional Comments: patient is unreliable historian.      Prior Functioning/Environment Prior Level of Function : Needs assist  Mobility Comments: unreliable historian. she did state she uses a walker when asked during my session ADLs Comments: requires assistance                 OT Goals(Current goals can be found in the care plan section) Acute Rehab OT Goals OT Goal Formulation: All assessment  and education complete, DC therapy  OT Frequency:         AM-PAC OT "6 Clicks" Daily Activity     Outcome Measure Help from another person eating meals?: A Little Help from another person taking care of personal grooming?: A Little Help from another person toileting, which includes using toliet, bedpan, or urinal?: A Little Help from another person bathing (including washing, rinsing, drying)?: A Little Help from another person to put on and taking off regular upper body clothing?: A Little Help from another person to put on and taking off regular lower body clothing?: A Little 6 Click Score: 18   End of Session    Activity Tolerance:   Patient left: Other (comment) (in bathroom with NT)  OT Visit Diagnosis: Unsteadiness on feet (R26.81)                Time: [id number] OT Time Calculation (min): 13 min Charges:  OT General Charges $OT Visit: 1 Visit OT Evaluation $OT Eval Low Complexity: 1 Low  Charlott Calvario OTR/L, MS Acute Rehabilitation Department Office# [phone]   Willa Rough 02/24/2022, 3:56 PM

## 2022-02-24 NOTE — Progress Notes (Addendum)
PROGRESS NOTE   Emma Maldonado  [id number]    DOB: 06/10/1950    DOA: 02/23/2022  PCP: Patient, No Pcp Per   I have briefly reviewed patients previous medical records in [facility].  Chief Complaint  Patient presents with   Altered Mental Status    Brief Narrative:  72 year old female, SNF resident, with medical history significant for type II DM, HLD, HTN, renal transplant in 1997 (follows with [facility]), orthostatic hypotension on fludrocortisone and advanced Lewy body dementia who presented from the SNF due to altered mental status but patient was unable to provide any history on admission due to acute encephalopathy versus dementia.  SNF reported patient not acting herself for the past 2 weeks and on day of ED visit had an episode of hypoxia with EMS and was placed on oxygen.  In ED, febrile to 101 F, tachypneic in the low 20s for a couple of hours, normotensive and not hypoxic on room air.  Admitted for sepsis due to suspected acute cystitis, AKI with acute metabolic encephalopathy.   Assessment & Plan:  Principal Problem:   Sepsis due to urinary tract infection ([place]) Active Problems:   History of renal transplant   Hyperlipidemia   Insulin dependent type 2 diabetes mellitus (HCC)   Dementia without behavioral disturbance (HCC)   Hypotension   Acute kidney injury superimposed on chronic kidney disease (HCC)   Sepsis, POA due to acute cystitis: Met sepsis criteria on admission.  Flu/RSV/COVID-19 PCR negative.  UA showed many bacteria and >50 WBC/hpf.  Transiently elevated lactate which has since normalized.  Significant 0000000 K, neutrophilic.  MRSA PCR negative.  Urine culture pending.  Blood cultures x 2: NTD.  Procalcitonin 2.05.  Treated with a liter of IV fluid bolus, maintenance IV fluids and started on empiric IV ceftriaxone and azithromycin, continue.  Since patient was transiently hypoxic and reported some cough and dyspnea on admission, elevated  procalcitonin, will check repeat chest x-ray to rule out pneumonia.  Sepsis physiology has resolved.  Acute kidney injury complicating stage II chronic kidney disease, history of renal transplant: Presented with creatinine of 1.2 to which after hydration has improved to 0.7.  Her baseline creatinine is in the 0.5 range.  AKI resolved.  S/p renal transplant: Continue prior home dose of prednisone 5 Mg daily and tacrolimus 0.5 Mg daily.  Outpatient follow-up with transplant nephrology.  Acute metabolic encephalopathy: Likely secondary to sepsis from acute cystitis, AKI complicating underlying advanced Lewy body dementia.  Was somnolent and barely arousable this morning but since then, has worked with PT.  Reassess mental status in AM.  CT head without acute findings.  Type II DM with hyperglycemia: A1c 8.9 on 02/24/2022 suggest poor outpatient control.  Also on chronic low-dose prednisone for renal transplant.  PTA on Lantus Solostar 60 units in the morning and 8 units at night along with Humalog SSI.  Had a hypoglycemic episode on night of admission with CBG of 48, treated per hypoglycemia protocol.  Since this morning, CBGs gradually trending up, most recent 171.  Initiated Semglee at 30 units daily including a dose now, NovoLog sensitive SSI with bedtime scale.  She will likely need higher doses of insulin and based on oral intake and CBG trends, need to quickly escalate insulins to her home dose as needed.  Monitor closely.  Hypertension: Uncontrolled.  Resume home dose of amlodipine 5 Mg daily today and given recent AKI, hold ARB for today and resume losartan 100 Mg daily  from tomorrow.  Hyperlipidemia: Continue rosuvastatin.  Hypothyroidism: Continue Synthroid 100 mcg daily.  Lewy body dementia without behavioral abnormalities: Continue home dose of Namenda.  As per discussion with daughter, at baseline, mostly in wheelchair, ambulates on without a walker.  Confirms that she has advanced  dementia and does not even recognize her son.  History of hypotension: Although here appears to be hypertensive.  At home on Florinef 0.2 mg at bedtime.  Not sure why this cannot be weaned off but defer this to SNF.  Thrombocytopenia: Likely related to sepsis physiology.  Follow CBC daily.  Also has mild anemia, likely dilutional.  Leukocytosis is improving.  There is no height or weight on file to calculate BMI.   Pressure Ulcer: Pressure Injury 02/23/22 Coccyx Medial Stage 1 -  Intact skin with non-blanchable redness of a localized area usually over a bony prominence. Redden area over the coccyx - minimal blanching skin (Active)  02/23/22 2100  Location: Coccyx  Location Orientation: Medial  Staging: Stage 1 -  Intact skin with non-blanchable redness of a localized area usually over a bony prominence.  Wound Description (Comments): Redden area over the coccyx - minimal blanching skin  Present on Admission: Yes  Dressing Type Foam - Lift dressing to assess site every shift 02/24/22 0840     DVT prophylaxis: enoxaparin (LOVENOX) injection 40 mg Start: 02/24/22 2200     Code Status: DNR: I discussed this in detail with patient's daughter who indicates that she was recently handed over patient's legal guardianship by her father.  She has to check with her father regarding her wishes but currently is a full code.  She was advised to bring a copy of the legal guardianship papers for hospital records.  Addendum: Daughter arrived in the hospital and confirmed that she wanted to change patient's CODE STATUS to DNR.  This was confirmed by me on the phone and by patient's RN in person.Order placed Family Communication: Daughter via phone. Disposition:  Status is: Inpatient Remains inpatient appropriate because: IV antibiotics     Consultants:     Procedures:     Antimicrobials:   As noted above   Subjective:  When seen this morning, quite somnolent, arousable to touch, would fight  off examiner's hand but had purposeful movements where she would pull the sheets over her.  Subsequently RN reported that she worked with PT and was alert enough to start diet and home meds.  Objective:   Vitals:   02/24/22 1000 02/24/22 1215 02/24/22 1400 02/24/22 1600  BP: (!) 166/73 (!) 168/67 (!) 168/68 (!) 157/65  Pulse: 84 82 99   Resp: 15 20 20 20  $ Temp:   99.1 F (37.3 C) 99.2 F (37.3 C)  TempSrc:   Oral Oral  SpO2: 96% 95% 99%     General exam: Elderly female, moderately built and nourished lying comfortably supine in bed without distress. Respiratory system: Clear to auscultation. Respiratory effort normal. Cardiovascular system: S1 & S2 heard, RRR. No JVD, murmurs, rubs, gallops or clicks. No pedal edema.  Telemetry personally reviewed: Sinus rhythm. Gastrointestinal system: Abdomen is nondistended, soft and nontender. No organomegaly or masses felt. Normal bowel sounds heard. Central nervous system: Mental status as noted above. No focal neurological deficits. Extremities: Symmetric 5 x 5 power. Skin: No rashes, lesions or ulcers Psychiatry: Judgement and insight could not be assessed due to to her mental status. Mood & affect could not be assessed.     Data Reviewed:   I  have personally reviewed following labs and imaging studies   CBC: Recent Labs  Lab 02/23/22 1310 02/24/22 0422  WBC 23.9* 17.6*  NEUTROABS 21.2*  --   HGB 12.4 11.7*  HCT 38.5 37.2  MCV 81.7 83.4  PLT 130* 98*    Basic Metabolic Panel: Recent Labs  Lab 02/23/22 1310 02/24/22 0422  NA 138 140  K 3.7 3.7  CL 106 112*  CO2 24 21*  GLUCOSE 178* 128*  BUN 18 15  CREATININE 1.22* 0.70  CALCIUM 9.1 8.5*    Liver Function Tests: Recent Labs  Lab 02/23/22 1310  AST 28  ALT 29  ALKPHOS 86  BILITOT 1.3*  PROT 5.9*  ALBUMIN 3.1*    CBG: Recent Labs  Lab 02/24/22 0730 02/24/22 1213 02/24/22 1635  GLUCAP 157* 171* 203*    Microbiology Studies:   Recent Results (from  the past 240 hour(s))  Culture, blood (Routine x 2)     Status: None (Preliminary result)   Collection Time: 02/23/22  1:15 PM   Specimen: BLOOD RIGHT FOREARM  Result Value Ref Range Status   Specimen Description   Final    BLOOD RIGHT FOREARM Performed at [facility] [address] [place], [place] [address]    Special Requests   Final    BOTTLES DRAWN AEROBIC AND ANAEROBIC Blood Culture results may not be optimal due to an inadequate volume of blood received in culture bottles Performed at [facility] [address] [place], [place] [address]    Culture   Final    NO GROWTH < 24 HOURS Performed at [facility] [address] [place], [place] [address]    Report Status PENDING  Incomplete  Culture, blood (Routine x 2)     Status: None (Preliminary result)   Collection Time: 02/23/22  1:15 PM   Specimen: BLOOD  Result Value Ref Range Status   Specimen Description   Final    BLOOD LEFT ANTECUBITAL Performed at [facility] [address], [place], [place] [address]    Special Requests   Final    BOTTLES DRAWN AEROBIC AND ANAEROBIC Blood Culture adequate volume Performed at [facility] [address], [place], [place] [address]    Culture   Final    NO GROWTH < 24 HOURS Performed at [facility] [address], [place], [place] [address]    Report Status PENDING  Incomplete  Resp panel by RT-PCR (RSV, Flu A&B, Covid) Anterior Nasal Swab     Status: None   Collection Time: 02/23/22  1:15 PM   Specimen: Anterior Nasal Swab  Result Value Ref Range Status   SARS Coronavirus 2 by RT PCR NEGATIVE NEGATIVE Final    Comment: (NOTE) SARS-CoV-2 target nucleic acids are NOT DETECTED.  The SARS-CoV-2 RNA is generally detectable in upper respiratory specimens during the acute phase of infection. The lowest concentration of SARS-CoV-2 viral copies this assay can detect is 138 copies/mL. A  negative result does not preclude SARS-Cov-2 infection and should not be used as the sole basis for treatment or other patient management decisions. A negative result may occur with  improper specimen collection/handling, submission of specimen other than nasopharyngeal swab, presence of viral mutation(s) within the areas targeted by this assay, and inadequate number of viral copies(<138 copies/mL). A negative result must be combined with clinical observations, patient history, and epidemiological information. The expected result is Negative.  Fact Sheet for  Patients:  EntrepreneurPulse.com.au  Fact Sheet for Healthcare Providers:  IncredibleEmployment.be  This test is no t yet approved or cleared by the [place] FDA and  has been authorized for detection and/or diagnosis of SARS-CoV-2 by FDA under an Emergency Use Authorization (EUA). This EUA will remain  in effect (meaning this test can be used) for the duration of the COVID-19 declaration under Section 564(b)(1) of the Act, 21 U.S.C.section 360bbb-3(b)(1), unless the authorization is terminated  or revoked sooner.       Influenza A by PCR NEGATIVE NEGATIVE Final   Influenza B by PCR NEGATIVE NEGATIVE Final    Comment: (NOTE) The Xpert Xpress SARS-CoV-2/FLU/RSV plus assay is intended as an aid in the diagnosis of influenza from Nasopharyngeal swab specimens and should not be used as a sole basis for treatment. Nasal washings and aspirates are unacceptable for Xpert Xpress SARS-CoV-2/FLU/RSV testing.  Fact Sheet for Patients: EntrepreneurPulse.com.au  Fact Sheet for Healthcare Providers: IncredibleEmployment.be  This test is not yet approved or cleared by the [place] FDA and has been authorized for detection and/or diagnosis of SARS-CoV-2 by FDA under an Emergency Use Authorization (EUA). This EUA will remain in effect (meaning this test can  be used) for the duration of the COVID-19 declaration under Section 564(b)(1) of the Act, 21 U.S.C. section 360bbb-3(b)(1), unless the authorization is terminated or revoked.     Resp Syncytial Virus by PCR NEGATIVE NEGATIVE Final    Comment: (NOTE) Fact Sheet for Patients: EntrepreneurPulse.com.au  Fact Sheet for Healthcare Providers: IncredibleEmployment.be  This test is not yet approved or cleared by the [place] FDA and has been authorized for detection and/or diagnosis of SARS-CoV-2 by FDA under an Emergency Use Authorization (EUA). This EUA will remain in effect (meaning this test can be used) for the duration of the COVID-19 declaration under Section 564(b)(1) of the Act, 21 U.S.C. section 360bbb-3(b)(1), unless the authorization is terminated or revoked.  Performed at [facility], [place] [address] [place], [place] [address]   Culture, Urine (Do not remove urinary catheter, catheter placed by urology or difficult to place)     Status: Abnormal (Preliminary result)   Collection Time: 02/23/22  1:15 PM   Specimen: Urine, Clean Catch  Result Value Ref Range Status   Specimen Description   Final    URINE, CLEAN CATCH Performed at [facility], [place] [address], [place], [place] [address]    Special Requests   Final    NONE Performed at [facility], [place] [address], [place], [place] [address]    Culture (A)  Final    >=100,000 COLONIES/mL GRAM NEGATIVE RODS IDENTIFICATION AND SUSCEPTIBILITIES TO FOLLOW Performed at [facility], [place] [address], [place], [place] [address]    Report Status PENDING  Incomplete  MRSA Next Gen by PCR, Nasal     Status: None   Collection Time: 02/24/22 12:13 AM   Specimen: Nasal Mucosa; Nasal Swab  Result Value Ref Range Status   MRSA by PCR Next Gen NOT DETECTED NOT DETECTED Final    Comment: (NOTE) The GeneXpert MRSA Assay (FDA approved  for NASAL specimens only), is one component of a comprehensive MRSA colonization surveillance program. It is not intended to diagnose MRSA infection nor to guide or monitor treatment for MRSA infections. Test performance is not FDA approved in patients less than 16 years old. Performed at [facility], [place] [address] [place], [place] [address]  Radiology Studies:  DG CHEST PORT 1 VIEW  Result Date: 02/24/2022 CLINICAL DATA:  Hypoxia EXAM: PORTABLE CHEST 1 VIEW COMPARISON:  Radiograph 02/23/2022 FINDINGS: Unchanged cardiomediastinal silhouette. There is no focal airspace consolidation. There is no pleural effusion or evidence of pneumothorax. There is no acute osseous abnormality. Thoracic spondylosis. Bilateral shoulder degenerative changes. IMPRESSION: No evidence of acute cardiopulmonary disease. Electronically Signed   By: Maurine Simmering M.D.   On: 02/24/2022 15:14   CT Head Wo Contrast  Result Date: 02/23/2022 CLINICAL DATA:  Altered mental status over the last 2 weeks. Delirium. EXAM: CT HEAD WITHOUT CONTRAST TECHNIQUE: Contiguous axial images were obtained from the base of the skull through the vertex without intravenous contrast. RADIATION DOSE REDUCTION: This exam was performed according to the departmental dose-optimization program which includes automated exposure control, adjustment of the mA and/or kV according to patient size and/or use of iterative reconstruction technique. COMPARISON:  01/01/2021 FINDINGS: Brain: Age related volume loss. No evidence of old or acute focal infarction, mass lesion, hemorrhage, hydrocephalus or extra-axial collection. Vascular: There is atherosclerotic calcification of the major vessels at the base of the brain. Skull: Negative Sinuses/Orbits: No evidence of acute sinus inflammation. Minimal chronic mucoperiosteal thickening of the right maxillary sinus. Orbits appear normal. Other: None IMPRESSION: No acute or significant CT finding.  Age related volume loss. Atherosclerotic calcification of the major vessels at the base of the brain. Electronically Signed   By: Nelson Chimes M.D.   On: 02/23/2022 13:44   DG Chest 2 View  Result Date: 02/23/2022 CLINICAL DATA:  Suspected sepsis EXAM: CHEST - 2 VIEW COMPARISON:  01/01/2021 FINDINGS: Artifact overlies the chest. Heart size is normal. Patient has taken a poor inspiration. Allowing for that, the lungs are felt to be clear. Evidence of heart failure or effusion. IMPRESSION: Poor inspiration. No active disease suspected. Electronically Signed   By: Nelson Chimes M.D.   On: 02/23/2022 13:42    Scheduled Meds:    amLODipine  5 mg Oral Daily   bethanechol  25 mg Oral TID   cholecalciferol  5,000 Units Oral Daily   cholestyramine light  4 g Oral BID   enoxaparin (LOVENOX) injection  40 mg Subcutaneous Q24H   fludrocortisone  0.2 mg Oral QHS   insulin aspart  0-5 Units Subcutaneous QHS   insulin aspart  0-9 Units Subcutaneous TID WC   insulin glargine-yfgn  30 Units Subcutaneous Daily   [START ON 02/25/2022] levothyroxine  100 mcg Oral QAC breakfast   [START ON 02/25/2022] losartan  100 mg Oral q AM   memantine  10 mg Oral Q12H   [START ON 02/25/2022] multivitamin with minerals  1 tablet Oral Daily   predniSONE  5 mg Oral q morning   rosuvastatin  5 mg Oral QHS   tacrolimus  0.5 mg Oral Daily    Continuous Infusions:    [START ON 02/25/2022] cefTRIAXone (ROCEPHIN)  IV       LOS: 1 day     Vernell Leep, MD,  FACP, [place], [place], [facility], [place]     To contact the attending provider between 7A-7P or the covering provider during after hours 7P-7A, please log into the web site [email or web address] and access using universal [place] password for that web site. If you do not have the password, please call the hospital operator.  02/24/2022, 6:39 PM

## 2022-02-24 NOTE — Assessment & Plan Note (Signed)
History of orthostatic hypotension.  Holding home fludrocortisone while NPO.  Restart when needed.

## 2022-02-24 NOTE — Progress Notes (Signed)
Spoke with patient's daughter, Rolley Sims, regarding patient's code status for being a DNR. MD made aware of family request.

## 2022-02-24 NOTE — Progress Notes (Signed)
Hypoglycemic Event  CBG: 48  Treatment: D50 25 mL (12.5 gm)  Symptoms: None and    Follow-up CBG: Time:2235 CBG Result:127  Possible Reasons for Event: Unknown  Comments/MD notified:[place], DO aware- recheck BG @ 0045 and start Dextrose 5 LR if less than 70.     Ghassan Coggeshall GILLIS

## 2022-02-24 NOTE — Assessment & Plan Note (Signed)
Follows with [facility] prednisone and tacrolimus. -Restart home medications when able

## 2022-02-25 DIAGNOSIS — N179 Acute kidney failure, unspecified: Secondary | ICD-10-CM | POA: Diagnosis not present

## 2022-02-25 DIAGNOSIS — N189 Chronic kidney disease, unspecified: Secondary | ICD-10-CM

## 2022-02-25 DIAGNOSIS — E785 Hyperlipidemia, unspecified: Secondary | ICD-10-CM

## 2022-02-25 DIAGNOSIS — A419 Sepsis, unspecified organism: Secondary | ICD-10-CM | POA: Diagnosis not present

## 2022-02-25 DIAGNOSIS — E119 Type 2 diabetes mellitus without complications: Secondary | ICD-10-CM

## 2022-02-25 DIAGNOSIS — I951 Orthostatic hypotension: Secondary | ICD-10-CM

## 2022-02-25 DIAGNOSIS — F0390 Unspecified dementia without behavioral disturbance: Secondary | ICD-10-CM

## 2022-02-25 DIAGNOSIS — Z794 Long term (current) use of insulin: Secondary | ICD-10-CM

## 2022-02-25 LAB — COMPREHENSIVE METABOLIC PANEL
ALT: 28 U/L (ref 0–44)
AST: 28 U/L (ref 15–41)
Albumin: 2.1 g/dL — ABNORMAL LOW (ref 3.5–5.0)
Alkaline Phosphatase: 72 U/L (ref 38–126)
Anion gap: 9 (ref 5–15)
BUN: 10 mg/dL (ref 8–23)
CO2: 22 mmol/L (ref 22–32)
Calcium: 8.3 mg/dL — ABNORMAL LOW (ref 8.9–10.3)
Chloride: 108 mmol/L (ref 98–111)
Creatinine, Ser: 0.55 mg/dL (ref 0.44–1.00)
GFR, Estimated: >60 mL/min (ref >60)
Glucose, Bld: 153 mg/dL — ABNORMAL HIGH (ref 70–99)
Potassium: 3.6 mmol/L (ref 3.5–5.1)
Sodium: 139 mmol/L (ref 135–145)
Total Bilirubin: 0.9 mg/dL (ref 0.3–1.2)
Total Protein: 4.9 g/dL — ABNORMAL LOW (ref 6.5–8.1)

## 2022-02-25 LAB — CBC
HCT: 34 % — ABNORMAL LOW (ref 36.0–46.0)
Hemoglobin: 11 g/dL — ABNORMAL LOW (ref 12.0–15.0)
MCH: 26.4 pg (ref 26.0–34.0)
MCHC: 32.4 g/dL (ref 30.0–36.0)
MCV: 81.5 fL (ref 80.0–100.0)
Platelets: 89 10*3/uL — ABNORMAL LOW (ref 150–400)
RBC: 4.17 MIL/uL (ref 3.87–5.11)
RDW: 14.6 % (ref 11.5–15.5)
WBC: 9.3 10*3/uL (ref 4.0–10.5)
nRBC: 0 % (ref 0.0–0.2)

## 2022-02-25 LAB — URINE CULTURE: Culture: >=100000 — AB

## 2022-02-25 LAB — GLUCOSE, CAPILLARY
Glucose-Capillary: 143 mg/dL — ABNORMAL HIGH (ref 70–99)
Glucose-Capillary: 162 mg/dL — ABNORMAL HIGH (ref 70–99)
Glucose-Capillary: 178 mg/dL — ABNORMAL HIGH (ref 70–99)
Glucose-Capillary: 224 mg/dL — ABNORMAL HIGH (ref 70–99)

## 2022-02-25 NOTE — Progress Notes (Signed)
PROGRESS NOTE    Emma Maldonado  [id number] DOB: August 12, 1950 DOA: 02/23/2022 PCP: Patient, No Pcp Per   Brief Narrative: Emma Maldonado is a 72 y.o. female with a history diabetes mellitus type 2, hyperlipidemia, hypertension, s/p renal transplant, orthostatic hypotension, Lewy body dementia. Patient presented secondary to altered mental status secondary to UTI. Improved with IV fluids and antibiotics.   Assessment and Plan:  Sepsis Present on admission. Secondary to UTI. Blood cultures with no growth. Antibiotics as mentioned below. Leukocytosis resolved.  UTI/Acute cystitis Present on admission. Associated metabolic encephalopathy. Urine culture significant for E. Coli. Patient managed with Ceftriaxone and is improving. -Continue Ceftriaxone IV  Acute metabolic encephalopathy Secondary to UTI. Complicated by underlying Lewy Body dementia. Improved.  AKI on CKD stage II Creatinine of 1.2 on admission. Resolved with IV fluids.  Chronic hypotension Patient is managed on Florinef as an outpatient -Continue Florinef  Dementia -Continue Namenda  Diabetes mellitus type 2 -Continue Semglee 30 units daily and SSI  Hypothyroidism -Continue levothyroxine 100 mcg daily  Hyperlipidemia -Continue Crestor  History of renal transplant Noted. -Continue tacrolimus and prednisone  Primary hypertension -Continue losartan and amlodipine  Thrombocytopenia Likely reactive secondary to acute infection. Platelets down to 89,000 today. No evidence of bleeding -CBC in AM  Pressure injury Medial coccyx, present on admission.   DVT prophylaxis: Lovenox Code Status:   Code Status: DNR Family Communication: None at bedside. Called daughter but no response Disposition Plan: Discharge back to facility possibly in 24 hours if remains stable and transition to outpatient antibiotics   Consultants:  None  Procedures:    Antimicrobials: Ceftriaxone     Subjective: No issues noted overnight.  Objective: BP (!) 169/71 (BP Location: Left Arm)   Pulse 76   Temp 97.9 F (36.6 C) (Oral)   Resp 20   SpO2 97%   Examination:  General exam: Appears calm and comfortable Respiratory system: Clear to auscultation. Respiratory effort normal. Cardiovascular system: S1 & S2 heard, RRR. Gastrointestinal system: Abdomen is nondistended, soft and nontender. Normal bowel sounds heard. Central nervous system: Alert. Follows simple commands. Musculoskeletal: No edema. No calf tenderness Skin: No cyanosis. No rashes   Data Reviewed: I have personally reviewed following labs and imaging studies  CBC Lab Results  Component Value Date   WBC 9.3 02/25/2022   RBC 4.17 02/25/2022   HGB 11.0 (L) 02/25/2022   HCT 34.0 (L) 02/25/2022   MCV 81.5 02/25/2022   MCH 26.4 02/25/2022   PLT 89 (L) 02/25/2022   MCHC 32.4 02/25/2022   RDW 14.6 02/25/2022   LYMPHSABS 1.3 02/23/2022   MONOABS 1.1 (H) 02/23/2022   EOSABS 0.0 02/23/2022   BASOSABS 0.1 [id number]     Last metabolic panel Lab Results  Component Value Date   NA 139 02/25/2022   K 3.6 02/25/2022   CL 108 02/25/2022   CO2 22 02/25/2022   BUN 10 02/25/2022   CREATININE 0.55 02/25/2022   GLUCOSE 153 (H) 02/25/2022   GFRNONAA >60 02/25/2022   GFRAA >90 08/14/2012   CALCIUM 8.3 (L) 02/25/2022   PHOS 4.2 08/28/2020   PROT 4.9 (L) 02/25/2022   ALBUMIN 2.1 (L) 02/25/2022   BILITOT 0.9 02/25/2022   ALKPHOS 72 02/25/2022   AST 28 02/25/2022   ALT 28 02/25/2022   ANIONGAP 9 02/25/2022    GFR: CrCl cannot be calculated (Unknown ideal weight.).  Recent Results (from the past 240 hour(s))  Culture, blood (Routine x 2)     Status:  None (Preliminary result)   Collection Time: 02/23/22  1:15 PM   Specimen: BLOOD RIGHT FOREARM  Result Value Ref Range Status   Specimen Description   Final    BLOOD RIGHT FOREARM Performed at [facility] [address],  [place], [place] [address]    Special Requests   Final    BOTTLES DRAWN AEROBIC AND ANAEROBIC Blood Culture results may not be optimal due to an inadequate volume of blood received in culture bottles Performed at [facility] [address], [place], [place] [address]    Culture   Final    NO GROWTH 2 DAYS Performed at [facility] [address] [place], [place] [address]    Report Status PENDING  Incomplete  Culture, blood (Routine x 2)     Status: None (Preliminary result)   Collection Time: 02/23/22  1:15 PM   Specimen: BLOOD  Result Value Ref Range Status   Specimen Description   Final    BLOOD LEFT ANTECUBITAL Performed at [facility] [address], [place], [place] [address]    Special Requests   Final    BOTTLES DRAWN AEROBIC AND ANAEROBIC Blood Culture adequate volume Performed at [facility] [address] [place], [place] [address]    Culture   Final    NO GROWTH 2 DAYS Performed at [facility] [address] [place], [place] [address]    Report Status PENDING  Incomplete  Resp panel by RT-PCR (RSV, Flu A&B, Covid) Anterior Nasal Swab     Status: None   Collection Time: 02/23/22  1:15 PM   Specimen: Anterior Nasal Swab  Result Value Ref Range Status   SARS Coronavirus 2 by RT PCR NEGATIVE NEGATIVE Final    Comment: (NOTE) SARS-CoV-2 target nucleic acids are NOT DETECTED.  The SARS-CoV-2 RNA is generally detectable in upper respiratory specimens during the acute phase of infection. The lowest concentration of SARS-CoV-2 viral copies this assay can detect is 138 copies/mL. A negative result does not preclude SARS-Cov-2 infection and should not be used as the sole basis for treatment or other patient management decisions. A negative result may occur with  improper specimen collection/handling, submission of specimen other than nasopharyngeal swab, presence of viral mutation(s) within  the areas targeted by this assay, and inadequate number of viral copies(<138 copies/mL). A negative result must be combined with clinical observations, patient history, and epidemiological information. The expected result is Negative.  Fact Sheet for Patients:  EntrepreneurPulse.com.au  Fact Sheet for Healthcare Providers:  IncredibleEmployment.be  This test is no t yet approved or cleared by the [place] FDA and  has been authorized for detection and/or diagnosis of SARS-CoV-2 by FDA under an Emergency Use Authorization (EUA). This EUA will remain  in effect (meaning this test can be used) for the duration of the COVID-19 declaration under Section 564(b)(1) of the Act, 21 U.S.C.section 360bbb-3(b)(1), unless the authorization is terminated  or revoked sooner.       Influenza A by PCR NEGATIVE NEGATIVE Final   Influenza B by PCR NEGATIVE NEGATIVE Final    Comment: (NOTE) The Xpert Xpress SARS-CoV-2/FLU/RSV plus assay is intended as an aid in the diagnosis of influenza from Nasopharyngeal swab specimens and should not be used as a sole basis for treatment. Nasal washings and aspirates are unacceptable for Xpert Xpress SARS-CoV-2/FLU/RSV testing.  Fact Sheet for Patients: EntrepreneurPulse.com.au  Fact Sheet for Healthcare Providers: IncredibleEmployment.be  This  test is not yet approved or cleared by the [place] and has been authorized for detection and/or diagnosis of SARS-CoV-2 by FDA under an Emergency Use Authorization (EUA). This EUA will remain in effect (meaning this test can be used) for the duration of the COVID-19 declaration under Section 564(b)(1) of the Act, 21 U.S.C. section 360bbb-3(b)(1), unless the authorization is terminated or revoked.     Resp Syncytial Virus by PCR NEGATIVE NEGATIVE Final    Comment: (NOTE) Fact Sheet for  Patients: EntrepreneurPulse.com.au  Fact Sheet for Healthcare Providers: IncredibleEmployment.be  This test is not yet approved or cleared by the [place] FDA and has been authorized for detection and/or diagnosis of SARS-CoV-2 by FDA under an Emergency Use Authorization (EUA). This EUA will remain in effect (meaning this test can be used) for the duration of the COVID-19 declaration under Section 564(b)(1) of the Act, 21 U.S.C. section 360bbb-3(b)(1), unless the authorization is terminated or revoked.  Performed at [facility], [place] [address], [place], [place] [address]   Culture, Urine (Do not remove urinary catheter, catheter placed by urology or difficult to place)     Status: Abnormal   Collection Time: 02/23/22  1:15 PM   Specimen: Urine, Clean Catch  Result Value Ref Range Status   Specimen Description   Final    URINE, CLEAN CATCH Performed at [facility], [place] [address], [place], [place] [address]    Special Requests   Final    NONE Performed at [facility], [place] [address], [place], [place] [address]    Culture >=100,000 COLONIES/mL ESCHERICHIA COLI (A)  Final   Report Status 02/25/2022 FINAL  Final   Organism ID, Bacteria ESCHERICHIA COLI (A)  Final      Susceptibility   Escherichia coli - MIC*    AMPICILLIN 16 INTERMEDIATE Intermediate     CEFAZOLIN <=4 SENSITIVE Sensitive     CEFEPIME <=0.12 SENSITIVE Sensitive     CEFTRIAXONE <=0.25 SENSITIVE Sensitive     CIPROFLOXACIN <=0.25 SENSITIVE Sensitive     GENTAMICIN <=1 SENSITIVE Sensitive     IMIPENEM <=0.25 SENSITIVE Sensitive     NITROFURANTOIN <=16 SENSITIVE Sensitive     TRIMETH/SULFA <=20 SENSITIVE Sensitive     AMPICILLIN/SULBACTAM 4 SENSITIVE Sensitive     PIP/TAZO 8 SENSITIVE Sensitive     * >=100,000 COLONIES/mL ESCHERICHIA COLI  MRSA Next Gen by PCR, Nasal     Status: None   Collection Time: 02/24/22  12:13 AM   Specimen: Nasal Mucosa; Nasal Swab  Result Value Ref Range Status   MRSA by PCR Next Gen NOT DETECTED NOT DETECTED Final    Comment: (NOTE) The GeneXpert MRSA Assay (FDA approved for NASAL specimens only), is one component of a comprehensive MRSA colonization surveillance program. It is not intended to diagnose MRSA infection nor to guide or monitor treatment for MRSA infections. Test performance is not FDA approved in patients less than 20 years old. Performed at [facility], [place] [address] [place], [place] [address]       Radiology Studies: DG CHEST PORT 1 VIEW  Result Date: 02/24/2022 CLINICAL DATA:  Hypoxia EXAM: PORTABLE CHEST 1 VIEW COMPARISON:  Radiograph 02/23/2022 FINDINGS: Unchanged cardiomediastinal silhouette. There is no focal airspace consolidation. There is no pleural effusion or evidence of pneumothorax. There is no acute osseous abnormality. Thoracic spondylosis. Bilateral shoulder degenerative changes. IMPRESSION: No evidence of acute cardiopulmonary disease. Electronically Signed   By: Ileene Patrick.D.  On: 02/24/2022 15:14      LOS: 2 days    Cordelia Poche, MD [facility] 02/25/2022, 4:48 PM   If 7PM-7AM, please contact night-coverage [email or web address]

## 2022-02-25 NOTE — Hospital Course (Signed)
Emma Maldonado is a 72 y.o. female with a history diabetes mellitus type 2, hyperlipidemia, hypertension, s/p renal transplant, orthostatic hypotension, Lewy body dementia. Patient presented secondary to altered mental status secondary to UTI. Improved with IV fluids and antibiotics.

## 2022-02-25 NOTE — TOC Initial Note (Signed)
Transition of Care [facility]) - Initial/Assessment Note    Patient Details  Name: Emma Maldonado MRN: [id number] Date of Birth: [birth year]  Transition [facility]) CM/SW Contact:    Illene Regulus, LCSW Phone Number: 02/25/2022, 10:11 AM  Clinical Narrative:                  CSW confirmed pt is a LTC resident with   [facility]. Can return upon d/c . TOC to follow for d/c needs.       Patient Goals and CMS Choice            Expected Discharge Plan and Services                                              Prior Living Arrangements/Services                       Activities of Daily Living      Permission Sought/Granted                  Emotional Assessment              Admission diagnosis:  Encephalopathy [G93.40] Sepsis due to urinary tract infection ([place]) [A41.9, N39.0] Sepsis, due to unspecified organism, unspecified whether acute organ dysfunction present [facility]) [A41.9] Patient Active Problem List   Diagnosis Date Noted   Sepsis due to urinary tract infection ([place]) 02/23/2022   Acute lower UTI    Chronic diarrhea 01/05/2021   Pressure injury of skin 01/03/2021   Sepsis ([place]) 01/03/2021   Acute urinary retention 01/03/2021   Bacteremia    Severe sepsis ([place]) 12/23/2020   Hypothyroidism 12/23/2020   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   Hypomagnesemia    Acute kidney injury superimposed on chronic kidney disease (HCC)    Hypotension 08/22/2020   Hydronephrosis of right kidney 05/01/2020   Rib fractures 05/01/2020   UTI (urinary tract infection) 03/25/2020   Insulin dependent type 2 diabetes mellitus ([place]) 03/25/2020   Hypokalemia 03/25/2020   Dementia without behavioral disturbance ([place]) 03/25/2020   Altered mental status 03/25/2020   Uncontrolled type 2 diabetes mellitus with hyperglycemia ([place]) 03/25/2020   Accelerated hypertension 03/28/2012   Viral gastroenteritis 03/28/2012   History of renal transplant 03/26/2012    Diabetes mellitus ([place]) 03/26/2012   Hyperlipidemia 03/26/2012   PCP:  Patient, No Pcp Per Pharmacy:   [facility], [place] - [address] [place] [address] Phone: [phone] Fax: [phone]     Social Determinants of Health (SDOH) Social History: SDOH Screenings   Tobacco Use: Low Risk  (02/24/2022)   SDOH Interventions:     Readmission Risk Interventions    01/03/2021    2:24 PM 01/03/2021    2:16 PM  Readmission Risk Prevention Plan  Transportation Screening  Complete  HRI or [place] Complete   Social Work Consult for [place] Planning/Counseling Complete   Palliative Care Screening Not Applicable   Medication Review [employer])  Complete

## 2022-02-25 NOTE — Progress Notes (Signed)
Mobility Specialist - Progress Note   02/25/22 1013  Mobility  Activity Transferred to/from [facility]  Level of Assistance Contact guard assist, steadying assist  Range of Motion/Exercises Active  Activity Response Tolerated fair  Mobility Referral Yes  $Mobility charge 1 Mobility   Pt was found getting up from chair wanting to get up to have a BM. Pt transitioned to bed and afterwards to [facility] to finish BM. Afterwards NT in room to assist in pt care and RN notified of occurrence. C/o back pain after standing for for a couple minutes and reaching for the chair or bed to sit down. Pt was left in bed with all necessities in reach and reiterated to pt importance of calling when wanting assistance in getting up.   Ferd Hibbs Mobility Specialist

## 2022-02-25 NOTE — Progress Notes (Signed)
Mobility Specialist - Progress Note   02/25/22 1526  Mobility  Activity Dangled on edge of bed  Level of Assistance Contact guard assist, steadying assist  Range of Motion/Exercises Active  Activity Response Tolerated fair  $Mobility charge 1 Mobility   Pt was found in bed and agreeable to ambulate. Wanted HHA to sit EOB and once sitting went supine back on bed. Pt then stated not wanting to ambulate anymore. Pt was left in bed with all necessities in reach. NT notified.  Ferd Hibbs Mobility Specialist

## 2022-02-26 DIAGNOSIS — A419 Sepsis, unspecified organism: Secondary | ICD-10-CM | POA: Diagnosis not present

## 2022-02-26 DIAGNOSIS — N179 Acute kidney failure, unspecified: Secondary | ICD-10-CM | POA: Diagnosis not present

## 2022-02-26 DIAGNOSIS — I10 Essential (primary) hypertension: Secondary | ICD-10-CM

## 2022-02-26 DIAGNOSIS — F0390 Unspecified dementia without behavioral disturbance: Secondary | ICD-10-CM | POA: Diagnosis not present

## 2022-02-26 LAB — CBC
HCT: 37.7 % (ref 36.0–46.0)
Hemoglobin: 12.2 g/dL (ref 12.0–15.0)
MCH: 25.8 pg — ABNORMAL LOW (ref 26.0–34.0)
MCHC: 32.4 g/dL (ref 30.0–36.0)
MCV: 79.9 fL — ABNORMAL LOW (ref 80.0–100.0)
Platelets: 117 10*3/uL — ABNORMAL LOW (ref 150–400)
RBC: 4.72 MIL/uL (ref 3.87–5.11)
RDW: 14.2 % (ref 11.5–15.5)
WBC: 5.9 10*3/uL (ref 4.0–10.5)
nRBC: 0 % (ref 0.0–0.2)

## 2022-02-26 LAB — GLUCOSE, CAPILLARY
Glucose-Capillary: 83 mg/dL (ref 70–99)
Glucose-Capillary: 157 mg/dL — ABNORMAL HIGH (ref 70–99)
Glucose-Capillary: 247 mg/dL — ABNORMAL HIGH (ref 70–99)

## 2022-02-26 MED ORDER — CEPHALEXIN 500 MG PO CAPS: 500.0000 mg | ORAL_CAPSULE | Freq: Two times a day (BID) | ORAL | Status: AC

## 2022-02-26 MED ORDER — LANTUS SOLOSTAR 100 UNIT/ML ~~LOC~~ SOPN: 20.0000 [IU] | PEN_INJECTOR | Freq: Every morning | SUBCUTANEOUS | 11 refills | Status: DC

## 2022-02-26 NOTE — Discharge Instructions (Signed)
Emma Maldonado,  You were in the hospital with infection and severe confusion. This has improved with antibiotics. You appear to have had a urinary tract infection. Please continue antibiotics as prescribed.

## 2022-02-26 NOTE — Care Management Important Message (Signed)
Important Message  Patient Details IM Letter placed in Patients room. Name: Emma Maldonado MRN: [id number] Date of Birth: 12-15-1950   Medicare Important Message Given:  Yes     Kerin Salen 02/26/2022, 12:27 PM

## 2022-02-26 NOTE — Progress Notes (Signed)
Patient report called to Bobbye [place], RN at [facility]. All questions answered regarding discharge and medication regimen. Awaiting transportation to arrive to take patient back to facility.

## 2022-02-26 NOTE — Discharge Summary (Addendum)
Physician Discharge Summary   Patient: Emma Maldonado MRN: [id number] DOB: [birth year]  Admit date:     02/23/2022  Discharge date: 02/26/22  Discharge Physician: Cordelia Poche, MD   PCP: Patient, No Pcp Per   Recommendations at discharge:   Repeat CBC/BMP in one week  Discharge Diagnoses: Principal Problem:   Sepsis due to urinary tract infection ([place]) Active Problems:   History of renal transplant   Hyperlipidemia   Insulin dependent type 2 diabetes mellitus ([place])   Hypertension   Dementia without behavioral disturbance ([place])   Hypotension   Acute kidney injury superimposed on chronic kidney disease ([place])  Resolved Problems:   * No resolved hospital problems. *  Hospital Course: Emma Maldonado is a 72 y.o. female with a history diabetes mellitus type 2, hyperlipidemia, hypertension, s/p renal transplant, orthostatic hypotension, Lewy body dementia. Patient presented secondary to altered mental status secondary to UTI. Improved with IV fluids and antibiotics. Transitioned to oral Kelex on discharge.  Assessment and Plan:  Sepsis Present on admission. Secondary to UTI. Blood cultures with no growth. Antibiotics as mentioned below. Leukocytosis resolved.   UTI/Acute cystitis Present on admission. Associated metabolic encephalopathy. Urine culture significant for E. Coli. Patient managed with Ceftriaxone and improved. Discharge on Keflex.   Acute metabolic encephalopathy Secondary to UTI. Complicated by underlying Lewy Body dementia. Improved.   AKI on CKD stage II Creatinine of 1.2 on admission. Resolved with IV fluids.   Chronic hypotension Patient is managed on Florinef as an outpatient. Unsure of etiology, although listed orthostatic hypotension. Patient is also managed on antihypertensives for hypertension. Continue Florinef but this may need to be addressed or clarified.   Dementia Continue Namenda   Diabetes mellitus type 2 Continue Semglee 20 units  daily and SSI   Hypothyroidism Continue levothyroxine 100 mcg daily   Hyperlipidemia Continue Crestor   History of renal transplant Noted. Continue tacrolimus and prednisone   Primary hypertension Continue losartan and amlodipine   Thrombocytopenia Likely reactive secondary to acute infection. Platelets down to 89,000 and is trending back up. Platelets of 117,000 prior to discharge. No evidence of bleeding   Pressure injury Medial coccyx, present on admission.   Consultants: None Procedures performed: None  Disposition: Long term care facility Diet recommendation: Cardiac and Carb modified diet   DISCHARGE MEDICATION: Allergies as of 02/26/2022   No Known Allergies      Medication List     STOP taking these medications    cefTRIAXone 2 g injection Commonly known as: ROCEPHIN   hydrocortisone 100 MG/60ML enema Commonly known as: CORTENEMA   insulin aspart 100 UNIT/ML injection Commonly known as: novoLOG       TAKE these medications    acetaminophen 500 MG tablet Commonly known as: TYLENOL Take 500 mg by mouth every 6 (six) hours as needed (for pain).   amLODipine 5 MG tablet Commonly known as: NORVASC Take 5 mg by mouth daily.   bethanechol 25 MG tablet Commonly known as: URECHOLINE Take 25 mg by mouth 3 (three) times daily.   cephALEXin 500 MG capsule Commonly known as: KEFLEX Take 1 capsule (500 mg total) by mouth 2 (two) times daily for 2 days.   cholestyramine 4 g packet Commonly known as: QUESTRAN Take 4 g by mouth See admin instructions. Mix 4 grams as directed and take 2 times a day- should be taken two hours before or after any other medications   Dermacloud Oint Apply 1 application  topically See admin instructions. Apply  to groin/buttocks 3 times a day and after each episode of incontinence   fludrocortisone 0.1 MG tablet Commonly known as: FLORINEF Take 0.2 mg by mouth at bedtime.   HumaLOG 100 UNIT/ML injection Generic drug:  insulin lispro Inject 0-10 Units into the skin See admin instructions. Inject 0-10 units into the skin three times a day before meals AND at bedtime, per sliding scale: BGL 0-149 = give nothing; 150-200 = 2 units; 201-250 = 4 units; 251-300 = 6 units; 301-350 = 8 units; 351-400 = 10 units; 400-1,000 = 10 units and CALL MD!   Lantus SoloStar 100 UNIT/ML Solostar Pen Generic drug: insulin glargine Inject 20 Units into the skin in the morning. What changed:  how much to take when to take this additional instructions   levothyroxine 100 MCG tablet Commonly known as: SYNTHROID Take 100 mcg by mouth daily before breakfast.   loperamide 2 MG capsule Commonly known as: IMODIUM Take 2 mg by mouth every 4 (four) hours as needed (for diarrhea).   losartan 100 MG tablet Commonly known as: COZAAR Take 100 mg by mouth in the morning.   magnesium hydroxide 400 MG/5ML suspension Commonly known as: MILK OF MAGNESIA Take 30 mLs by mouth daily as needed (for no B/M after 3 days).   memantine 10 MG tablet Commonly known as: NAMENDA Take 10 mg by mouth every 12 (twelve) hours.   multivitamin with minerals Tabs tablet Take 1 tablet by mouth daily.   predniSONE 5 MG tablet Commonly known as: DELTASONE Take 5 mg by mouth every morning.   rosuvastatin 5 MG tablet Commonly known as: CRESTOR Take 5 mg by mouth at bedtime.   tacrolimus 0.5 MG capsule Commonly known as: PROGRAF Take 1 capsule (0.5 mg total) by mouth daily.   Vitamin D3 125 MCG (5000 UT) Tabs Take 5,000 Units by mouth daily.        Discharge Exam: BP (!) 147/68 (BP Location: Right Arm)   Pulse 75   Temp 97.8 F (36.6 C) (Oral)   Resp 19   SpO2 95%   General exam: Appears calm and comfortable Respiratory system: Clear to auscultation. Respiratory effort normal. Cardiovascular system: S1 & S2 heard, RRR.  Condition at discharge: stable  The results of significant diagnostics from this hospitalization (including  imaging, microbiology, ancillary and laboratory) are listed below for reference.   Imaging Studies: DG CHEST PORT 1 VIEW  Result Date: 02/24/2022 CLINICAL DATA:  Hypoxia EXAM: PORTABLE CHEST 1 VIEW COMPARISON:  Radiograph 02/23/2022 FINDINGS: Unchanged cardiomediastinal silhouette. There is no focal airspace consolidation. There is no pleural effusion or evidence of pneumothorax. There is no acute osseous abnormality. Thoracic spondylosis. Bilateral shoulder degenerative changes. IMPRESSION: No evidence of acute cardiopulmonary disease. Electronically Signed   By: Maurine Simmering M.D.   On: 02/24/2022 15:14   CT Head Wo Contrast  Result Date: 02/23/2022 CLINICAL DATA:  Altered mental status over the last 2 weeks. Delirium. EXAM: CT HEAD WITHOUT CONTRAST TECHNIQUE: Contiguous axial images were obtained from the base of the skull through the vertex without intravenous contrast. RADIATION DOSE REDUCTION: This exam was performed according to the departmental dose-optimization program which includes automated exposure control, adjustment of the mA and/or kV according to patient size and/or use of iterative reconstruction technique. COMPARISON:  01/01/2021 FINDINGS: Brain: Age related volume loss. No evidence of old or acute focal infarction, mass lesion, hemorrhage, hydrocephalus or extra-axial collection. Vascular: There is atherosclerotic calcification of the major vessels at the base of the brain.  Skull: Negative Sinuses/Orbits: No evidence of acute sinus inflammation. Minimal chronic mucoperiosteal thickening of the right maxillary sinus. Orbits appear normal. Other: None IMPRESSION: No acute or significant CT finding. Age related volume loss. Atherosclerotic calcification of the major vessels at the base of the brain. Electronically Signed   By: Nelson Chimes M.D.   On: 02/23/2022 13:44   DG Chest 2 View  Result Date: 02/23/2022 CLINICAL DATA:  Suspected sepsis EXAM: CHEST - 2 VIEW COMPARISON:  01/01/2021  FINDINGS: Artifact overlies the chest. Heart size is normal. Patient has taken a poor inspiration. Allowing for that, the lungs are felt to be clear. Evidence of heart failure or effusion. IMPRESSION: Poor inspiration. No active disease suspected. Electronically Signed   By: Nelson Chimes M.D.   On: 02/23/2022 13:42    Microbiology: Results for orders placed or performed during the hospital encounter of 02/23/22  Culture, blood (Routine x 2)     Status: None (Preliminary result)   Collection Time: 02/23/22  1:15 PM   Specimen: BLOOD RIGHT FOREARM  Result Value Ref Range Status   Specimen Description   Final    BLOOD RIGHT FOREARM Performed at [facility] [address], [place], [place] [address]    Special Requests   Final    BOTTLES DRAWN AEROBIC AND ANAEROBIC Blood Culture results may not be optimal due to an inadequate volume of blood received in culture bottles Performed at [facility] [address], [place], [place] [address]    Culture   Final    NO GROWTH 3 DAYS Performed at [facility], [place] [address], [place], [place] [address]    Report Status PENDING  Incomplete  Culture, blood (Routine x 2)     Status: None (Preliminary result)   Collection Time: 02/23/22  1:15 PM   Specimen: BLOOD  Result Value Ref Range Status   Specimen Description   Final    BLOOD LEFT ANTECUBITAL Performed at [facility] [address], [place], [place] [address]    Special Requests   Final    BOTTLES DRAWN AEROBIC AND ANAEROBIC Blood Culture adequate volume Performed at [facility] [address], [place], [place] [address]    Culture   Final    NO GROWTH 3 DAYS Performed at [facility], [place] [address], [place], [place] [address]    Report Status PENDING  Incomplete  Resp panel by RT-PCR (RSV, Flu A&B, Covid) Anterior Nasal Swab     Status: None   Collection Time: 02/23/22  1:15 PM   Specimen:  Anterior Nasal Swab  Result Value Ref Range Status   SARS Coronavirus 2 by RT PCR NEGATIVE NEGATIVE Final    Comment: (NOTE) SARS-CoV-2 target nucleic acids are NOT DETECTED.  The SARS-CoV-2 RNA is generally detectable in upper respiratory specimens during the acute phase of infection. The lowest concentration of SARS-CoV-2 viral copies this assay can detect is 138 copies/mL. A negative result does not preclude SARS-Cov-2 infection and should not be used as the sole basis for treatment or other patient management decisions. A negative result may occur with  improper specimen collection/handling, submission of specimen other than nasopharyngeal swab, presence of viral mutation(s) within the areas targeted by this assay, and inadequate number of viral copies(<138 copies/mL). A negative result must be combined with clinical observations, patient history, and epidemiological information. The expected result is Negative.  Fact Sheet for Patients:  EntrepreneurPulse.com.au  Fact  Sheet for Healthcare Providers:  IncredibleEmployment.be  This test is no t yet approved or cleared by the [place] FDA and  has been authorized for detection and/or diagnosis of SARS-CoV-2 by FDA under an Emergency Use Authorization (EUA). This EUA will remain  in effect (meaning this test can be used) for the duration of the COVID-19 declaration under Section 564(b)(1) of the Act, 21 U.S.C.section 360bbb-3(b)(1), unless the authorization is terminated  or revoked sooner.       Influenza A by PCR NEGATIVE NEGATIVE Final   Influenza B by PCR NEGATIVE NEGATIVE Final    Comment: (NOTE) The Xpert Xpress SARS-CoV-2/FLU/RSV plus assay is intended as an aid in the diagnosis of influenza from Nasopharyngeal swab specimens and should not be used as a sole basis for treatment. Nasal washings and aspirates are unacceptable for Xpert Xpress SARS-CoV-2/FLU/RSV testing.  Fact  Sheet for Patients: EntrepreneurPulse.com.au  Fact Sheet for Healthcare Providers: IncredibleEmployment.be  This test is not yet approved or cleared by the [place] FDA and has been authorized for detection and/or diagnosis of SARS-CoV-2 by FDA under an Emergency Use Authorization (EUA). This EUA will remain in effect (meaning this test can be used) for the duration of the COVID-19 declaration under Section 564(b)(1) of the Act, 21 U.S.C. section 360bbb-3(b)(1), unless the authorization is terminated or revoked.     Resp Syncytial Virus by PCR NEGATIVE NEGATIVE Final    Comment: (NOTE) Fact Sheet for Patients: EntrepreneurPulse.com.au  Fact Sheet for Healthcare Providers: IncredibleEmployment.be  This test is not yet approved or cleared by the [place] FDA and has been authorized for detection and/or diagnosis of SARS-CoV-2 by FDA under an Emergency Use Authorization (EUA). This EUA will remain in effect (meaning this test can be used) for the duration of the COVID-19 declaration under Section 564(b)(1) of the Act, 21 U.S.C. section 360bbb-3(b)(1), unless the authorization is terminated or revoked.  Performed at [facility], [place] [address], [place], [place] [address]   Culture, Urine (Do not remove urinary catheter, catheter placed by urology or difficult to place)     Status: Abnormal   Collection Time: 02/23/22  1:15 PM   Specimen: Urine, Clean Catch  Result Value Ref Range Status   Specimen Description   Final    URINE, CLEAN CATCH Performed at [facility], [place] [address] [place], [place] [address]    Special Requests   Final    NONE Performed at [facility], [place] [address], [place], [place] [address]    Culture >=100,000 COLONIES/mL ESCHERICHIA COLI (A)  Final   Report Status 02/25/2022 FINAL  Final   Organism ID, Bacteria  ESCHERICHIA COLI (A)  Final      Susceptibility   Escherichia coli - MIC*    AMPICILLIN 16 INTERMEDIATE Intermediate     CEFAZOLIN <=4 SENSITIVE Sensitive     CEFEPIME <=0.12 SENSITIVE Sensitive     CEFTRIAXONE <=0.25 SENSITIVE Sensitive     CIPROFLOXACIN <=0.25 SENSITIVE Sensitive     GENTAMICIN <=1 SENSITIVE Sensitive     IMIPENEM <=0.25 SENSITIVE Sensitive     NITROFURANTOIN <=16 SENSITIVE Sensitive     TRIMETH/SULFA <=20 SENSITIVE Sensitive     AMPICILLIN/SULBACTAM 4 SENSITIVE Sensitive     PIP/TAZO 8 SENSITIVE Sensitive     * >=100,000 COLONIES/mL ESCHERICHIA COLI  MRSA Next Gen by PCR, Nasal     Status: None   Collection Time: 02/24/22 12:13 AM   Specimen: Nasal Mucosa; Nasal Swab  Result Value Ref Range Status   MRSA by PCR Next Gen NOT DETECTED NOT DETECTED Final    Comment: (NOTE) The GeneXpert MRSA Assay (FDA approved for NASAL specimens only), is one component of a comprehensive MRSA colonization surveillance program. It is not intended to diagnose MRSA infection nor to guide or monitor treatment for MRSA infections. Test performance is not FDA approved in patients less than 70 years old. Performed at [facility], [place] [address], [place], [place] [address]     Labs: CBC: Recent Labs  Lab 02/23/22 1310 02/24/22 0422 02/25/22 0428 02/26/22 0957  WBC 23.9* 17.6* 9.3 5.9  NEUTROABS 21.2*  --   --   --   HGB 12.4 11.7* 11.0* 12.2  HCT 38.5 37.2 34.0* 37.7  MCV 81.7 83.4 81.5 79.9*  PLT 130* 98* 89* 123XX123*   Basic Metabolic Panel: Recent Labs  Lab 02/23/22 1310 02/24/22 0422 02/25/22 0428  NA 138 140 139  K 3.7 3.7 3.6  CL 106 112* 108  CO2 24 21* 22  GLUCOSE 178* 128* 153*  BUN 18 15 10  $ CREATININE 1.22* 0.70 0.55  CALCIUM 9.1 8.5* 8.3*   Liver Function Tests: Recent Labs  Lab 02/23/22 1310 02/25/22 0428  AST 28 28  ALT 29 28  ALKPHOS 86 72  BILITOT 1.3* 0.9  PROT 5.9* 4.9*  ALBUMIN 3.1* 2.1*   CBG: Recent Labs  Lab  02/25/22 1141 02/25/22 1631 02/25/22 2052 02/26/22 0753 02/26/22 1202  GLUCAP 162* 178* 224* 83 157*    Discharge time spent: 35 minutes.  Signed: Cordelia Poche, MD [facility] 02/26/2022

## 2022-02-26 NOTE — TOC Transition Note (Addendum)
Transition of Care [facility]) - CM/SW Discharge Note   Patient Details  Name: Emma Maldonado MRN: [id number] Date of Birth: 1950-10-20  [facility] CM/SW Contact:  Illene Regulus, LCSW Phone Number: 02/26/2022, 1:34 PM   Clinical Narrative:     CSW spoke with pt's daughter to inform her pt will be discharging back to facility today. She is requesting EMS transport. Awaiting d/c summary. CSW will arrange [facility] transport. TOC to follow.   ADDEN D/c summary sent , [facility] called . No additional needs TOC sign off.         Patient Goals and CMS Choice      Discharge Placement                         Discharge Plan and Services Additional resources added to the After Visit Summary for                                       Social Determinants of Health (SDOH) Interventions SDOH Screenings   Tobacco Use: Low Risk  (02/24/2022)     Readmission Risk Interventions    01/03/2021    2:24 PM 01/03/2021    2:16 PM  Readmission Risk Prevention Plan  Transportation Screening  Complete  HRI or [place] Complete   Social Work Consult for [place] Planning/Counseling Complete   Palliative Care Screening Not Applicable   Medication Review [employer])  Complete

## 2022-02-28 LAB — CULTURE, BLOOD (ROUTINE X 2)
Culture: NO GROWTH
Culture: NO GROWTH
Special Requests: ADEQUATE

## 2022-08-01 IMAGING — DX DG CHEST 2V
2 series · 2 of 2 positions shown · non-contrast
Comparison: 05/06/2019

CLINICAL DATA: Chest pain

EXAM:
CHEST - 2 VIEW

[chest pa]
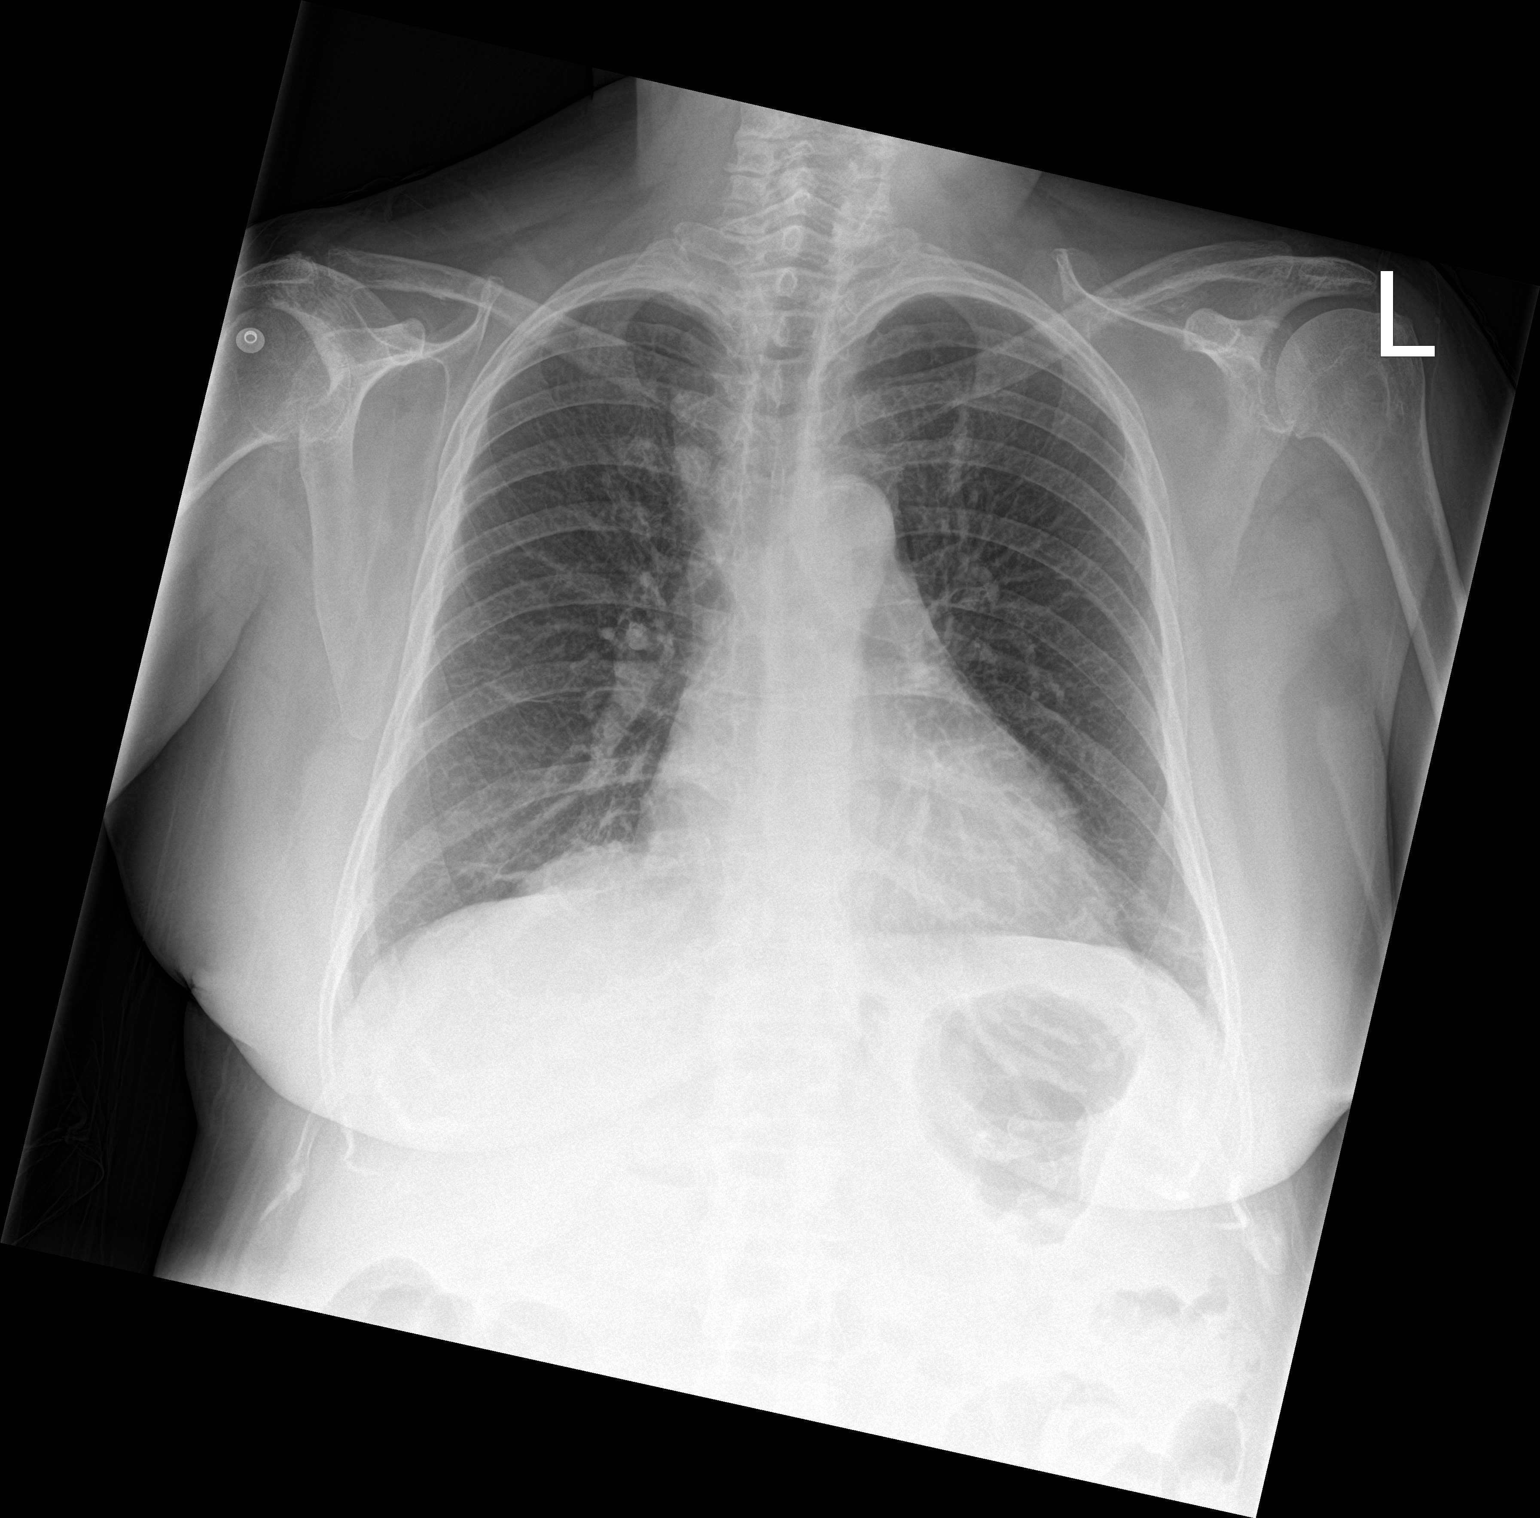

[chest lat]
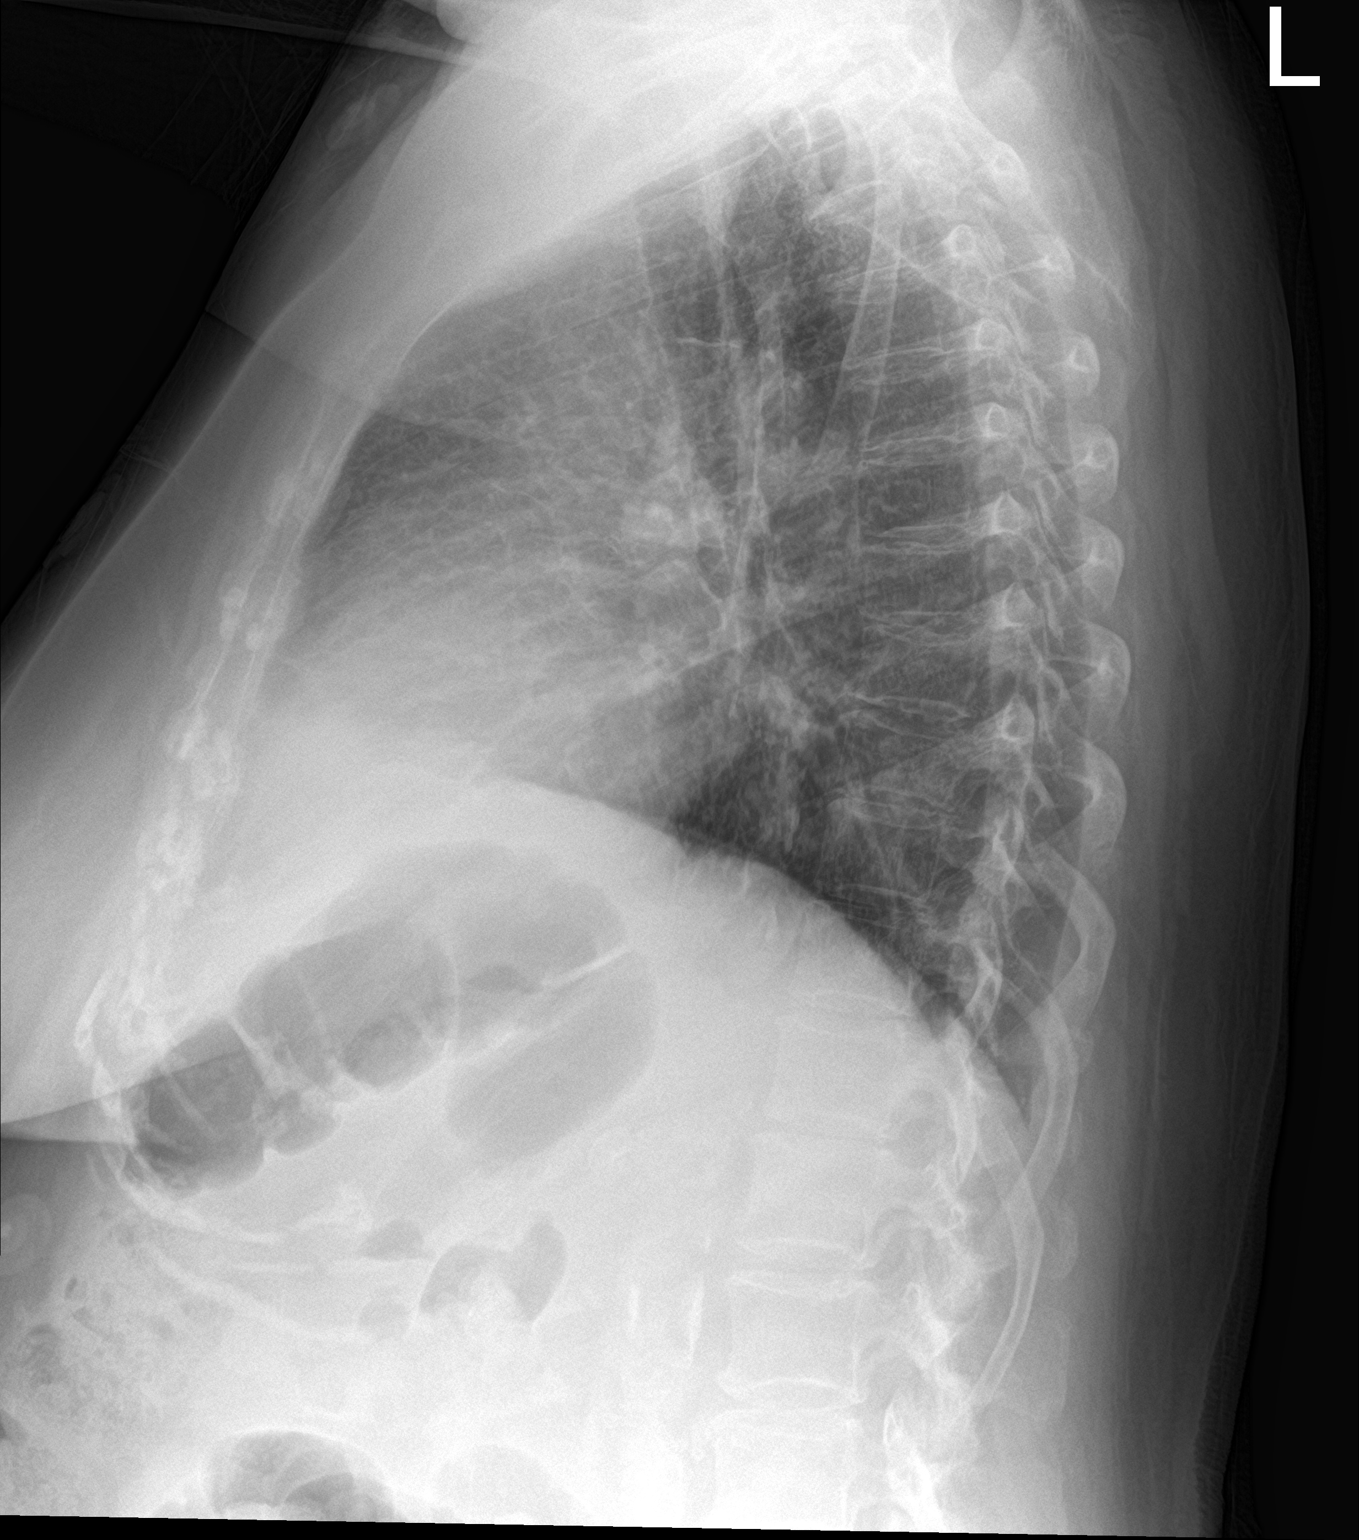

[2 of 2 positions shown; findings below may reference images not displayed]

FINDINGS: There are streaky airspace opacities at the lung bases favored to
represent areas of atelectasis. There is no large pleural effusion
or pneumothorax. The heart size is unremarkable. Aortic
calcifications are noted.
IMPRESSION: No active cardiopulmonary disease.

## 2022-08-13 IMAGING — DX DG CHEST 1V PORT
1 series · 1 of 1 positions shown · non-contrast
Comparison: 03/12/2020

CLINICAL DATA: Fell, hyperglycemia, sepsis

EXAM:
PORTABLE CHEST 1 VIEW

[chest]
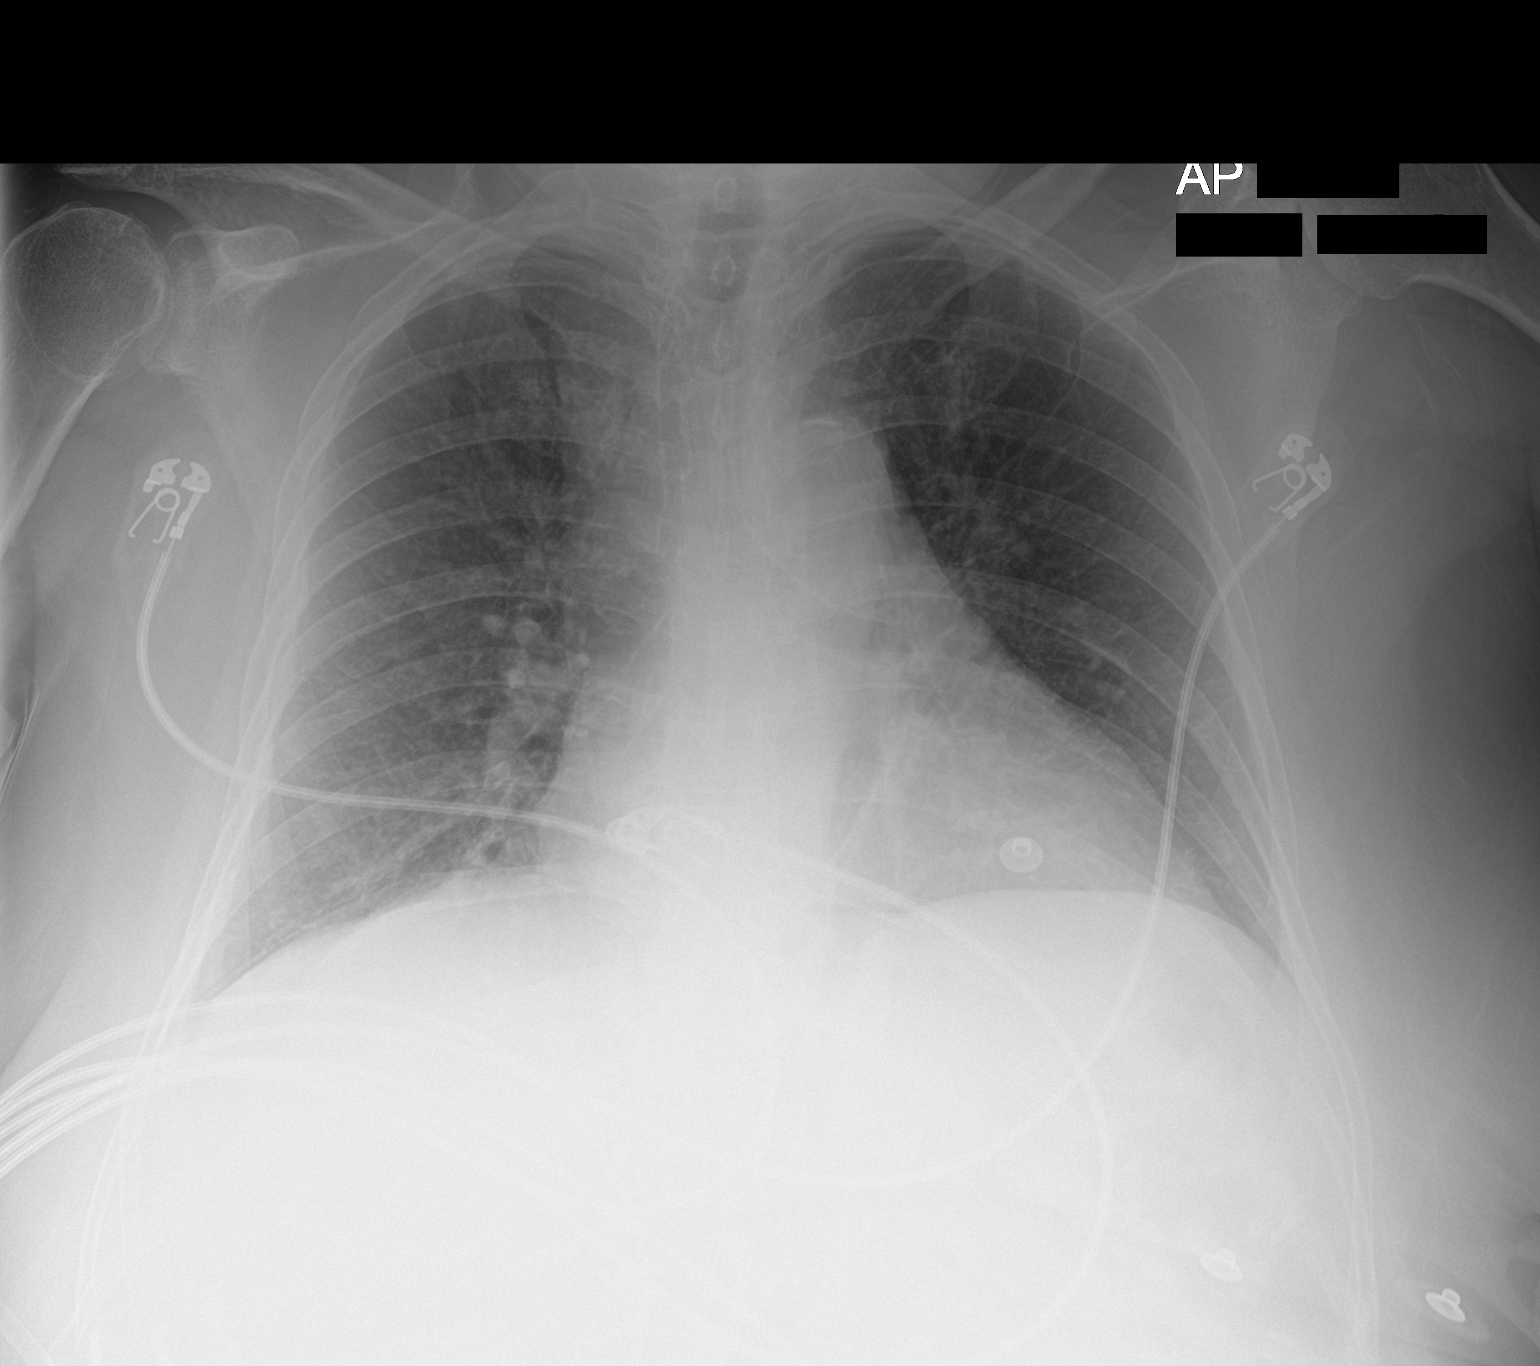

[1 of 1 positions shown; findings below may reference images not displayed]

FINDINGS: The heart size and mediastinal contours are within normal limits.
Both lungs are clear. The visualized skeletal structures are
unremarkable.
IMPRESSION: No active disease.

## 2022-10-24 ENCOUNTER — Emergency Department (HOSPITAL_COMMUNITY): Payer: Medicare Other

## 2022-10-24 ENCOUNTER — Inpatient Hospital Stay (HOSPITAL_COMMUNITY)
Admission: EM | Admit: 2022-10-24 | Discharge: 2022-10-29 | DRG: 871 | Disposition: A | Discharge status: Skilled Nursing Facility | Payer: Medicare Other | Attending: Family Medicine | Admitting: Family Medicine

## 2022-10-24 DIAGNOSIS — A419 Sepsis, unspecified organism: Secondary | ICD-10-CM | POA: Diagnosis not present

## 2022-10-24 DIAGNOSIS — N3000 Acute cystitis without hematuria: Principal | ICD-10-CM | POA: Diagnosis present

## 2022-10-24 DIAGNOSIS — R6520 Severe sepsis without septic shock: Secondary | ICD-10-CM | POA: Diagnosis present

## 2022-10-24 DIAGNOSIS — N390 Urinary tract infection, site not specified: Secondary | ICD-10-CM | POA: Diagnosis present

## 2022-10-24 DIAGNOSIS — Z1152 Encounter for screening for COVID-19: Secondary | ICD-10-CM

## 2022-10-24 DIAGNOSIS — E860 Dehydration: Secondary | ICD-10-CM | POA: Diagnosis present

## 2022-10-24 DIAGNOSIS — Z940 Kidney transplant status: Secondary | ICD-10-CM

## 2022-10-24 DIAGNOSIS — I10 Essential (primary) hypertension: Secondary | ICD-10-CM | POA: Diagnosis present

## 2022-10-24 DIAGNOSIS — E785 Hyperlipidemia, unspecified: Secondary | ICD-10-CM | POA: Diagnosis present

## 2022-10-24 DIAGNOSIS — R339 Retention of urine, unspecified: Secondary | ICD-10-CM | POA: Insufficient documentation

## 2022-10-24 DIAGNOSIS — R4182 Altered mental status, unspecified: Secondary | ICD-10-CM | POA: Diagnosis not present

## 2022-10-24 DIAGNOSIS — Z794 Long term (current) use of insulin: Secondary | ICD-10-CM

## 2022-10-24 DIAGNOSIS — E876 Hypokalemia: Secondary | ICD-10-CM | POA: Diagnosis not present

## 2022-10-24 DIAGNOSIS — Z8616 Personal history of COVID-19: Secondary | ICD-10-CM

## 2022-10-24 DIAGNOSIS — M8008XA Age-related osteoporosis with current pathological fracture, vertebra(e), initial encounter for fracture: Secondary | ICD-10-CM | POA: Diagnosis present

## 2022-10-24 DIAGNOSIS — N179 Acute kidney failure, unspecified: Secondary | ICD-10-CM | POA: Diagnosis not present

## 2022-10-24 DIAGNOSIS — F0280 Dementia in other diseases classified elsewhere without behavioral disturbance: Secondary | ICD-10-CM | POA: Diagnosis present

## 2022-10-24 DIAGNOSIS — Z79890 Hormone replacement therapy (postmenopausal): Secondary | ICD-10-CM

## 2022-10-24 DIAGNOSIS — G9341 Metabolic encephalopathy: Secondary | ICD-10-CM | POA: Diagnosis present

## 2022-10-24 DIAGNOSIS — E2740 Unspecified adrenocortical insufficiency: Secondary | ICD-10-CM | POA: Diagnosis present

## 2022-10-24 DIAGNOSIS — Y92009 Unspecified place in unspecified non-institutional (private) residence as the place of occurrence of the external cause: Secondary | ICD-10-CM

## 2022-10-24 DIAGNOSIS — Z79899 Other long term (current) drug therapy: Secondary | ICD-10-CM

## 2022-10-24 DIAGNOSIS — E1165 Type 2 diabetes mellitus with hyperglycemia: Secondary | ICD-10-CM | POA: Diagnosis present

## 2022-10-24 DIAGNOSIS — S32029A Unspecified fracture of second lumbar vertebra, initial encounter for closed fracture: Secondary | ICD-10-CM

## 2022-10-24 DIAGNOSIS — G309 Alzheimer's disease, unspecified: Secondary | ICD-10-CM | POA: Diagnosis present

## 2022-10-24 DIAGNOSIS — Z66 Do not resuscitate: Secondary | ICD-10-CM | POA: Diagnosis present

## 2022-10-24 DIAGNOSIS — Z87440 Personal history of urinary (tract) infections: Secondary | ICD-10-CM

## 2022-10-24 DIAGNOSIS — E872 Acidosis, unspecified: Secondary | ICD-10-CM | POA: Diagnosis present

## 2022-10-24 DIAGNOSIS — R1310 Dysphagia, unspecified: Secondary | ICD-10-CM | POA: Diagnosis present

## 2022-10-24 DIAGNOSIS — E1122 Type 2 diabetes mellitus with diabetic chronic kidney disease: Secondary | ICD-10-CM | POA: Diagnosis present

## 2022-10-24 DIAGNOSIS — E039 Hypothyroidism, unspecified: Secondary | ICD-10-CM | POA: Diagnosis present

## 2022-10-24 DIAGNOSIS — Z79621 Long term (current) use of calcineurin inhibitor: Secondary | ICD-10-CM

## 2022-10-24 DIAGNOSIS — Z7952 Long term (current) use of systemic steroids: Secondary | ICD-10-CM

## 2022-10-24 DIAGNOSIS — W19XXXA Unspecified fall, initial encounter: Secondary | ICD-10-CM | POA: Diagnosis present

## 2022-10-24 LAB — I-STAT CG4 LACTIC ACID, ED
Lactic Acid, Venous: 2.1 mmol/L (ref 0.5–1.9)
Lactic Acid, Venous: 1.4 mmol/L (ref 0.5–1.9)

## 2022-10-24 LAB — URINALYSIS, ROUTINE W REFLEX MICROSCOPIC
Bilirubin Urine: NEGATIVE
Glucose, UA: NEGATIVE mg/dL
Ketones, ur: NEGATIVE mg/dL
Nitrite: POSITIVE — AB
Protein, ur: 30 mg/dL — AB
Specific Gravity, Urine: 1.02 (ref 1.005–1.030)
pH: 6 (ref 5.0–8.0)

## 2022-10-24 LAB — COMPREHENSIVE METABOLIC PANEL
ALT: 23 U/L (ref 0–44)
AST: 32 U/L (ref 15–41)
Albumin: 2.6 g/dL — ABNORMAL LOW (ref 3.5–5.0)
Alkaline Phosphatase: 63 U/L (ref 38–126)
Anion gap: 14 (ref 5–15)
BUN: 26 mg/dL — ABNORMAL HIGH (ref 8–23)
CO2: 21 mmol/L — ABNORMAL LOW (ref 22–32)
Calcium: 9.2 mg/dL (ref 8.9–10.3)
Chloride: 107 mmol/L (ref 98–111)
Creatinine, Ser: 2.32 mg/dL — ABNORMAL HIGH (ref 0.44–1.00)
GFR, Estimated: 22 mL/min — ABNORMAL LOW (ref >60)
Glucose, Bld: 80 mg/dL (ref 70–99)
Potassium: 3.4 mmol/L — ABNORMAL LOW (ref 3.5–5.1)
Sodium: 142 mmol/L (ref 135–145)
Total Bilirubin: 1.4 mg/dL — ABNORMAL HIGH (ref 0.3–1.2)
Total Protein: 5.7 g/dL — ABNORMAL LOW (ref 6.5–8.1)

## 2022-10-24 LAB — I-STAT VENOUS BLOOD GAS, ED
Acid-base deficit: 4 mmol/L — ABNORMAL HIGH (ref 0.0–2.0)
Bicarbonate: 20.5 mmol/L (ref 20.0–28.0)
Calcium, Ion: 1.2 mmol/L (ref 1.15–1.40)
HCT: 34 % — ABNORMAL LOW (ref 36.0–46.0)
Hemoglobin: 11.6 g/dL — ABNORMAL LOW (ref 12.0–15.0)
O2 Saturation: 59 %
Potassium: 3.1 mmol/L — ABNORMAL LOW (ref 3.5–5.1)
Sodium: 143 mmol/L (ref 135–145)
TCO2: 22 mmol/L (ref 22–32)
pCO2, Ven: 34.5 mm[Hg] — ABNORMAL LOW (ref 44–60)
pH, Ven: 7.381 (ref 7.25–7.43)
pO2, Ven: 31 mm[Hg] — CL (ref 32–45)

## 2022-10-24 LAB — CBC WITH DIFFERENTIAL/PLATELET
Abs Immature Granulocytes: 0 10*3/uL (ref 0.00–0.07)
Basophils Absolute: 0.2 10*3/uL — ABNORMAL HIGH (ref 0.0–0.1)
Basophils Relative: 1 %
Eosinophils Absolute: 0 10*3/uL (ref 0.0–0.5)
Eosinophils Relative: 0 %
HCT: 37.2 % (ref 36.0–46.0)
Hemoglobin: 11.6 g/dL — ABNORMAL LOW (ref 12.0–15.0)
Lymphocytes Relative: 5 %
Lymphs Abs: 0.9 10*3/uL (ref 0.7–4.0)
MCH: 25.2 pg — ABNORMAL LOW (ref 26.0–34.0)
MCHC: 31.2 g/dL (ref 30.0–36.0)
MCV: 80.7 fL (ref 80.0–100.0)
Monocytes Absolute: 0 10*3/uL — ABNORMAL LOW (ref 0.1–1.0)
Monocytes Relative: 0 %
Neutro Abs: 17.4 10*3/uL — ABNORMAL HIGH (ref 1.7–7.7)
Neutrophils Relative %: 94 %
Platelets: 180 10*3/uL (ref 150–400)
RBC: 4.61 MIL/uL (ref 3.87–5.11)
RDW: 14.2 % (ref 11.5–15.5)
WBC: 18.5 10*3/uL — ABNORMAL HIGH (ref 4.0–10.5)
nRBC: 0 % (ref 0.0–0.2)
nRBC: 0 /100{WBCs}

## 2022-10-24 LAB — I-STAT CHEM 8, ED
BUN: 28 mg/dL — ABNORMAL HIGH (ref 8–23)
Calcium, Ion: 1.21 mmol/L (ref 1.15–1.40)
Chloride: 109 mmol/L (ref 98–111)
Creatinine, Ser: 2.2 mg/dL — ABNORMAL HIGH (ref 0.44–1.00)
Glucose, Bld: 79 mg/dL (ref 70–99)
HCT: 33 % — ABNORMAL LOW (ref 36.0–46.0)
Hemoglobin: 11.2 g/dL — ABNORMAL LOW (ref 12.0–15.0)
Potassium: 3.2 mmol/L — ABNORMAL LOW (ref 3.5–5.1)
Sodium: 143 mmol/L (ref 135–145)
TCO2: 20 mmol/L — ABNORMAL LOW (ref 22–32)

## 2022-10-24 LAB — CBG MONITORING, ED: Glucose-Capillary: 73 mg/dL (ref 70–99)

## 2022-10-24 LAB — HEMOGLOBIN A1C
Hgb A1c MFr Bld: 7 % — ABNORMAL HIGH (ref 4.8–5.6)
Mean Plasma Glucose: 154.2 mg/dL

## 2022-10-24 LAB — HIV ANTIBODY (ROUTINE TESTING W REFLEX): HIV Screen 4th Generation wRfx: NONREACTIVE

## 2022-10-24 LAB — URINALYSIS, MICROSCOPIC (REFLEX): WBC, UA: >50 WBC/hpf (ref 0–5)

## 2022-10-24 LAB — GLUCOSE, CAPILLARY
Glucose-Capillary: 206 mg/dL — ABNORMAL HIGH (ref 70–99)
Glucose-Capillary: 65 mg/dL — ABNORMAL LOW (ref 70–99)
Glucose-Capillary: 156 mg/dL — ABNORMAL HIGH (ref 70–99)
Glucose-Capillary: 86 mg/dL (ref 70–99)

## 2022-10-24 LAB — SARS CORONAVIRUS 2 BY RT PCR: SARS Coronavirus 2 by RT PCR: NEGATIVE

## 2022-10-24 LAB — TSH: TSH: 5.561 u[IU]/mL — ABNORMAL HIGH (ref 0.350–4.500)

## 2022-10-24 LAB — VITAMIN B12: Vitamin B-12: 267 pg/mL (ref 180–914)

## 2022-10-24 LAB — AMMONIA: Ammonia: <10 umol/L (ref 9–35)

## 2022-10-24 MED ORDER — LACTATED RINGERS IV SOLN
INTRAVENOUS | Status: AC
  Administered 2022-10-24: 150 mL/h via INTRAVENOUS

## 2022-10-24 MED ORDER — CHOLESTYRAMINE 4 G PO PACK
4.0000 g | PACK | Freq: Two times a day (BID) | ORAL | Status: DC
  Administered 2022-10-25 – 2022-10-29 (×9): 4 g via ORAL
  Filled 2022-10-24 (×11): qty 1

## 2022-10-24 MED ORDER — FLUDROCORTISONE ACETATE 0.1 MG PO TABS
0.2000 mg | ORAL_TABLET | Freq: Every day | ORAL | Status: DC
  Administered 2022-10-24 – 2022-10-28 (×5): 0.2 mg via ORAL
  Filled 2022-10-24 (×6): qty 2

## 2022-10-24 MED ORDER — INSULIN ASPART 100 UNIT/ML IJ SOLN
0.0000 [IU] | Freq: Three times a day (TID) | INTRAMUSCULAR | Status: DC
  Administered 2022-10-25: 2 [IU] via SUBCUTANEOUS
  Administered 2022-10-25: 5 [IU] via SUBCUTANEOUS
  Administered 2022-10-25 – 2022-10-27 (×5): 2 [IU] via SUBCUTANEOUS
  Administered 2022-10-27: 1 [IU] via SUBCUTANEOUS
  Administered 2022-10-28: 5 [IU] via SUBCUTANEOUS
  Administered 2022-10-28 – 2022-10-29 (×2): 3 [IU] via SUBCUTANEOUS
  Administered 2022-10-29: 2 [IU] via SUBCUTANEOUS

## 2022-10-24 MED ORDER — MAGNESIUM HYDROXIDE 400 MG/5ML PO SUSP: 30.0000 mL | Freq: Every day | ORAL | Status: DC | PRN

## 2022-10-24 MED ORDER — DEXTROSE 50 % IV SOLN
INTRAVENOUS | Status: AC
  Filled 2022-10-24: qty 50

## 2022-10-24 MED ORDER — TACROLIMUS 0.5 MG PO CAPS
0.5000 mg | ORAL_CAPSULE | Freq: Every day | ORAL | Status: DC
  Administered 2022-10-24 – 2022-10-29 (×6): 0.5 mg via ORAL
  Filled 2022-10-24 (×6): qty 1

## 2022-10-24 MED ORDER — ENOXAPARIN SODIUM 30 MG/0.3ML IJ SOSY
30.0000 mg | PREFILLED_SYRINGE | INTRAMUSCULAR | Status: DC
  Administered 2022-10-24 – 2022-10-28 (×5): 30 mg via SUBCUTANEOUS
  Filled 2022-10-24 (×5): qty 0.3

## 2022-10-24 MED ORDER — ROSUVASTATIN CALCIUM 5 MG PO TABS
5.0000 mg | ORAL_TABLET | Freq: Every day | ORAL | Status: DC
  Administered 2022-10-24 – 2022-10-28 (×5): 5 mg via ORAL
  Filled 2022-10-24 (×5): qty 1

## 2022-10-24 MED ORDER — SODIUM CHLORIDE 0.9 % IV SOLN
1.0000 g | INTRAVENOUS | Status: DC
  Administered 2022-10-25 – 2022-10-26 (×2): 1 g via INTRAVENOUS
  Filled 2022-10-24 (×2): qty 10

## 2022-10-24 MED ORDER — ACETAMINOPHEN 80 MG RE SUPP: 80.0000 mg | Freq: Once | RECTAL | Status: DC

## 2022-10-24 MED ORDER — POTASSIUM CHLORIDE 20 MEQ PO PACK
20.0000 meq | PACK | Freq: Every day | ORAL | Status: DC
  Filled 2022-10-24: qty 1

## 2022-10-24 MED ORDER — ACETAMINOPHEN 500 MG PO TABS: 500.0000 mg | ORAL_TABLET | ORAL | Status: DC | PRN

## 2022-10-24 MED ORDER — LACTATED RINGERS IV BOLUS
1000.0000 mL | Freq: Once | INTRAVENOUS | Status: AC
  Administered 2022-10-24: 1000 mL via INTRAVENOUS

## 2022-10-24 MED ORDER — LEVOTHYROXINE SODIUM 25 MCG PO TABS
125.0000 ug | ORAL_TABLET | Freq: Every day | ORAL | Status: DC
  Administered 2022-10-25 – 2022-10-29 (×5): 125 ug via ORAL
  Filled 2022-10-24 (×5): qty 1

## 2022-10-24 MED ORDER — ACETAMINOPHEN 10 MG/ML IV SOLN
1000.0000 mg | Freq: Four times a day (QID) | INTRAVENOUS | Status: DC
  Administered 2022-10-24: 1000 mg via INTRAVENOUS
  Filled 2022-10-24: qty 100

## 2022-10-24 MED ORDER — ACETAMINOPHEN 10 MG/ML IV SOLN: 1000.0000 mg | Freq: Four times a day (QID) | INTRAVENOUS | Status: AC | PRN

## 2022-10-24 MED ORDER — LOPERAMIDE HCL 1 MG/7.5ML PO SUSP: 2.0000 mg | ORAL | Status: DC | PRN

## 2022-10-24 MED ORDER — ACETAMINOPHEN 650 MG RE SUPP: 650.0000 mg | Freq: Once | RECTAL | Status: DC

## 2022-10-24 MED ORDER — SODIUM CHLORIDE 0.9 % IV BOLUS
500.0000 mL | Freq: Once | INTRAVENOUS | Status: AC
  Administered 2022-10-24: 500 mL via INTRAVENOUS

## 2022-10-24 MED ORDER — ACETAMINOPHEN 325 MG PO TABS
650.0000 mg | ORAL_TABLET | Freq: Four times a day (QID) | ORAL | Status: AC | PRN
  Administered 2022-10-25: 650 mg via ORAL
  Filled 2022-10-24: qty 2

## 2022-10-24 MED ORDER — DEXTROSE 50 % IV SOLN
12.5000 g | INTRAVENOUS | Status: AC
  Administered 2022-10-24: 12.5 g via INTRAVENOUS

## 2022-10-24 MED ORDER — POTASSIUM CHLORIDE 20 MEQ PO PACK
20.0000 meq | PACK | Freq: Two times a day (BID) | ORAL | Status: DC
  Administered 2022-10-25: 20 meq via ORAL
  Filled 2022-10-24: qty 1

## 2022-10-24 MED ORDER — MEMANTINE HCL 10 MG PO TABS
10.0000 mg | ORAL_TABLET | Freq: Two times a day (BID) | ORAL | Status: DC
  Administered 2022-10-24 – 2022-10-29 (×10): 10 mg via ORAL
  Filled 2022-10-24 (×11): qty 1

## 2022-10-24 MED ORDER — ACETAMINOPHEN 650 MG RE SUPP
RECTAL | Status: AC
  Administered 2022-10-24: 650 mg via RECTAL
  Filled 2022-10-24: qty 1

## 2022-10-24 MED ORDER — SODIUM CHLORIDE 0.9 % IV SOLN
1.0000 g | Freq: Once | INTRAVENOUS | Status: AC
  Administered 2022-10-24: 1 g via INTRAVENOUS
  Filled 2022-10-24: qty 10

## 2022-10-24 MED ORDER — BETHANECHOL CHLORIDE 25 MG PO TABS
25.0000 mg | ORAL_TABLET | Freq: Three times a day (TID) | ORAL | Status: DC
  Administered 2022-10-24 – 2022-10-29 (×14): 25 mg via ORAL
  Filled 2022-10-24 (×17): qty 1

## 2022-10-24 MED ORDER — ACETAMINOPHEN 10 MG/ML IV SOLN: 1000.0000 mg | Freq: Four times a day (QID) | INTRAVENOUS | Status: DC | PRN

## 2022-10-24 MED ORDER — ACETAMINOPHEN 325 MG PO TABS: 650.0000 mg | ORAL_TABLET | Freq: Four times a day (QID) | ORAL | Status: DC | PRN

## 2022-10-24 MED ORDER — PREDNISONE 5 MG PO TABS
5.0000 mg | ORAL_TABLET | Freq: Every morning | ORAL | Status: DC
  Administered 2022-10-25 – 2022-10-29 (×5): 5 mg via ORAL
  Filled 2022-10-24 (×6): qty 1

## 2022-10-24 NOTE — Progress Notes (Signed)
   10/24/22 1658  Vitals  BP (!) 83/42  MAP (mmHg) (!) 55  BP Location Left Arm  BP Method Automatic  Patient Position (if appropriate) Lying  Pulse Rate 95  Pulse Rate Source Dinamap  Resp 12  Level of Consciousness  Level of Consciousness Responds to Pain  MEWS Score  MEWS Temp 2  MEWS Systolic 1  MEWS Pulse 0  MEWS RR 1  MEWS LOC 2  MEWS Score 6  MEWS Score Color Red    Family medicine at bedside, Mabe to evaluate.

## 2022-10-24 NOTE — ED Notes (Signed)
ED TO INPATIENT HANDOFF REPORT  ED Nurse Name and Phone #: [phone] Boe Deans Y   S Name/Age/Gender Emma Maldonado 72 y.o. female Room/Bed: 024C/024C  Code Status   Code Status: Prior  Home/SNF/Other Home Disoriented X4  Is this baseline? Yes   Triage Complete: Triage complete  Chief Complaint AMS (altered mental status) [R41.82]  Triage Note Pt BIB [facility] from [facility] d/t AMS/unresponsive. Pt was found by staff laying in bed unconscious only responsive to painful stimuli. Pt's last seen normal was yesterday breakfast time. Pt has dementia at baseline. BP 90/44 which is pt's baseline. Pt is DNR.    Allergies No Known Allergies  Level of Care/Admitting Diagnosis ED Disposition     ED Disposition  Admit   Condition  --   Comment  Hospital Area: [facility] [[id number]]  Level of Care: Med-Surg [16]  May place patient in observation at [facility] or [facility] if equivalent level of care is available:: No  Covid Evaluation: Asymptomatic - no recent exposure (last 10 days) testing not required  Diagnosis: AMS (altered mental status) [[id number]]  Admitting Physician: Tomie China [[id number]]  Attending Physician: Doreene Eland [[id number]]          B Medical/Surgery History Past Medical History:  Diagnosis Date   Dementia (HCC)    Diabetes mellitus without complication (HCC)    Hypertension    Renal disorder    Renal insufficiency    Past Surgical History:  Procedure Laterality Date   ABDOMINAL HYSTERECTOMY     BIOPSY  12/28/2020   Procedure: BIOPSY;  Surgeon: Kathi Der, MD;  Location: [facility];  Service: Gastroenterology;;   CHOLECYSTECTOMY     FLEXIBLE SIGMOIDOSCOPY N/A 12/28/2020   Procedure: FLEXIBLE SIGMOIDOSCOPY;  Surgeon: Kathi Der, MD;  Location: [facility];  Service: Gastroenterology;  Laterality: N/A;   NEPHRECTOMY TRANSPLANTED ORGAN       A IV Location/Drains/Wounds Patient  Lines/Drains/Airways Status     Active Line/Drains/Airways     Name Placement date Placement time Site Days   Peripheral IV 10/24/22 18 G Anterior;Proximal;Right Forearm 10/24/22  0719  Forearm  less than 1   Peripheral IV 10/24/22 22 G Posterior;Right Hand 10/24/22  0720  Hand  less than 1   Pressure Injury 01/03/21 Sacrum Posterior Stage 2 -  Partial thickness loss of dermis presenting as a shallow open injury with a red, pink wound bed without slough. 01/03/21  0100  -- 659   Pressure Injury 02/23/22 Coccyx Medial Stage 1 -  Intact skin with non-blanchable redness of a localized area usually over a bony prominence. Redden area over the coccyx - minimal blanching skin 02/23/22  2100  -- 243            Intake/Output Last 24 hours No intake or output data in the 24 hours ending 10/24/22 1339  Labs/Imaging Results for orders placed or performed during the hospital encounter of 10/24/22 (from the past 48 hour(s))  Comprehensive metabolic panel     Status: Abnormal   Collection Time: 10/24/22  7:50 AM  Result Value Ref Range   Sodium 142 135 - 145 mmol/L   Potassium 3.4 (L) 3.5 - 5.1 mmol/L   Chloride 107 98 - 111 mmol/L   CO2 21 (L) 22 - 32 mmol/L   Glucose, Bld 80 70 - 99 mg/dL    Comment: Glucose reference range applies only to samples taken after fasting for at least 8 hours.   BUN 26 (H)  8 - 23 mg/dL   Creatinine, Ser 6.04 (H) 0.44 - 1.00 mg/dL   Calcium 9.2 8.9 - 54.0 mg/dL   Total Protein 5.7 (L) 6.5 - 8.1 g/dL   Albumin 2.6 (L) 3.5 - 5.0 g/dL   AST 32 15 - 41 U/L   ALT 23 0 - 44 U/L   Alkaline Phosphatase 63 38 - 126 U/L   Total Bilirubin 1.4 (H) 0.3 - 1.2 mg/dL   GFR, Estimated 22 (L) >60 mL/min    Comment: (NOTE) Calculated using the CKD-EPI Creatinine Equation (2021)    Anion gap 14 5 - 15    Comment: Performed at [facility], [address], [place], [place] [address]  CBC with Differential/Platelet     Status: Abnormal   Collection Time: 10/24/22  7:50  AM  Result Value Ref Range   WBC 18.5 (H) 4.0 - 10.5 K/uL   RBC 4.61 3.87 - 5.11 MIL/uL   Hemoglobin 11.6 (L) 12.0 - 15.0 g/dL   HCT 14.7 82.9 - 56.2 %   MCV 80.7 80.0 - 100.0 fL   MCH 25.2 (L) 26.0 - 34.0 pg   MCHC 31.2 30.0 - 36.0 g/dL   RDW 13.0 86.5 - 78.4 %   Platelets 180 150 - 400 K/uL   nRBC 0.0 0.0 - 0.2 %   Neutrophils Relative % 94 %   Neutro Abs 17.4 (H) 1.7 - 7.7 K/uL   Lymphocytes Relative 5 %   Lymphs Abs 0.9 0.7 - 4.0 K/uL   Monocytes Relative 0 %   Monocytes Absolute 0.0 (L) 0.1 - 1.0 K/uL   Eosinophils Relative 0 %   Eosinophils Absolute 0.0 0.0 - 0.5 K/uL   Basophils Relative 1 %   Basophils Absolute 0.2 (H) 0.0 - 0.1 K/uL   WBC Morphology See Note     Comment: Mild Left Shift. 1 to 5% Metas, occ myelo   nRBC 0 0 /100 WBC   Abs Immature Granulocytes 0.00 0.00 - 0.07 K/uL    Comment: Performed at [facility], [address] [place], [place] [address]  Ammonia     Status: None   Collection Time: 10/24/22  7:50 AM  Result Value Ref Range   Ammonia <10 9 - 35 umol/L    Comment: Performed at [facility], [address] [place], [place] [address]  CBG monitoring, ED     Status: None   Collection Time: 10/24/22  7:56 AM  Result Value Ref Range   Glucose-Capillary 73 70 - 99 mg/dL    Comment: Glucose reference range applies only to samples taken after fasting for at least 8 hours.  I-Stat Chem 8, ED     Status: Abnormal   Collection Time: 10/24/22  7:58 AM  Result Value Ref Range   Sodium 143 135 - 145 mmol/L   Potassium 3.2 (L) 3.5 - 5.1 mmol/L   Chloride 109 98 - 111 mmol/L   BUN 28 (H) 8 - 23 mg/dL   Creatinine, Ser 3.24 (H) 0.44 - 1.00 mg/dL   Glucose, Bld 79 70 - 99 mg/dL    Comment: Glucose reference range applies only to samples taken after fasting for at least 8 hours.   Calcium, Ion 1.21 1.15 - 1.40 mmol/L   TCO2 20 (L) 22 - 32 mmol/L   Hemoglobin 11.2 (L) 12.0 - 15.0 g/dL   HCT 40.1 (L) 02.7 - 25.3 %  I-Stat Lactic Acid, ED  Status: Abnormal   Collection Time: 10/24/22  7:59 AM  Result Value Ref Range   Lactic Acid, Venous 2.1 (HH) 0.5 - 1.9 mmol/L   Comment NOTIFIED PHYSICIAN   I-Stat venous blood gas, ED     Status: Abnormal   Collection Time: 10/24/22  7:59 AM  Result Value Ref Range   pH, Ven 7.381 7.25 - 7.43   pCO2, Ven 34.5 (L) 44 - 60 mmHg   pO2, Ven 31 (LL) 32 - 45 mmHg   Bicarbonate 20.5 20.0 - 28.0 mmol/L   TCO2 22 22 - 32 mmol/L   O2 Saturation 59 %   Acid-base deficit 4.0 (H) 0.0 - 2.0 mmol/L   Sodium 143 135 - 145 mmol/L   Potassium 3.1 (L) 3.5 - 5.1 mmol/L   Calcium, Ion 1.20 1.15 - 1.40 mmol/L   HCT 34.0 (L) 36.0 - 46.0 %   Hemoglobin 11.6 (L) 12.0 - 15.0 g/dL   Sample type VENOUS    Comment NOTIFIED PHYSICIAN   Urinalysis, Routine w reflex microscopic -Urine, Clean Catch     Status: Abnormal   Collection Time: 10/24/22 10:18 AM  Result Value Ref Range   Color, Urine YELLOW YELLOW   APPearance CLEAR CLEAR   Specific Gravity, Urine 1.020 1.005 - 1.030   pH 6.0 5.0 - 8.0   Glucose, UA NEGATIVE NEGATIVE mg/dL   Hgb urine dipstick TRACE (A) NEGATIVE   Bilirubin Urine NEGATIVE NEGATIVE   Ketones, ur NEGATIVE NEGATIVE mg/dL   Protein, ur 30 (A) NEGATIVE mg/dL   Nitrite POSITIVE (A) NEGATIVE   Leukocytes,Ua LARGE (A) NEGATIVE    Comment: Performed at [facility], [address] [place], [place] [address]  Urinalysis, Microscopic (reflex)     Status: Abnormal   Collection Time: 10/24/22 10:18 AM  Result Value Ref Range   RBC / HPF 0-5 0 - 5 RBC/hpf   WBC, UA >50 0 - 5 WBC/hpf   Bacteria, UA RARE (A) NONE SEEN   Squamous Epithelial / HPF 0-5 0 - 5 /HPF   WBC Clumps PRESENT    Mucus PRESENT    Hyaline Casts, UA PRESENT     Comment: Performed at [facility], [address], [place], [place] [address]  I-Stat Lactic Acid, ED     Status: None   Collection Time: 10/24/22 11:22 AM  Result Value Ref Range   Lactic Acid, Venous 1.4 0.5 - 1.9 mmol/L   CT Renal Stone  Study  Result Date: 10/24/2022 CLINICAL DATA:  Abdominal/flank pain. Stone suspected. Patient was found with altered mental status unresponsive at nursing home today. EXAM: CT ABDOMEN AND PELVIS WITHOUT CONTRAST TECHNIQUE: Multidetector CT imaging of the abdomen and pelvis was performed following the standard protocol without IV contrast. RADIATION DOSE REDUCTION: This exam was performed according to the departmental dose-optimization program which includes automated exposure control, adjustment of the mA and/or kV according to patient size and/or use of iterative reconstruction technique. COMPARISON:  CT of the abdomen and pelvis without contrast 01/02/2022 FINDINGS: Lower chest: Linear atelectasis or scarring is present at the lung bases. No significant airspace consolidation is present. Coronary artery calcifications are present. Calcifications are present at the aortic valve and mitral annulus. No pleural or pericardial effusion is present. Hepatobiliary: No focal liver abnormality is seen. Status post cholecystectomy. No biliary dilatation. Pancreas: Pancreas is moderately atrophic, stable. No discrete lesions are present. Spleen: Normal in size without focal abnormality. Adrenals/Urinary Tract: Adrenal glands are normal bilaterally. Chronic  renal atrophy is present. No discrete lesions are present. No stone or obstruction is present. A transplant kidney is present in the right lower quadrant. No stone or mass lesion is present. Ureter is within normal limits. Diffuse urinary bladder wall thickening is present. Elbow no discrete mass lesion is present. The thickening is more pronounced near the dome of the urinary bladder. Stomach/Bowel: The stomach and duodenum are within normal limits. The small bowel is unremarkable. The terminal ileum is within normal limits. The appendix is visualized and normal. The ascending and transverse colon are within normal limits. Diverticular changes are present in the  descending and sigmoid colon. No focal inflammatory changes are present to suggest diverticulitis. Vascular/Lymphatic: Atherosclerotic calcifications are present within the aorta and branch vessels. No aneurysm is present. No significant adenopathy is present. Reproductive: Status post hysterectomy. No adnexal masses. Other: No abdominal wall hernia or abnormality. No abdominopelvic ascites. Subcutaneous scratched at paraumbilical subcutaneous densities with central calcification are stable. Musculoskeletal: A superior endplate fracture at L2 demonstrates 40% loss of height anteriorly. No significant retropulsed bone is present. Vertebral body heights and alignment are otherwise normal. Straightening of the normal lumbar lordosis is present. Disc disease is most pronounced at L4-5 and L5-S1. The bony pelvis is within normal limits. The hips are located and normal bilaterally. IMPRESSION: 1. Diffuse urinary bladder wall thickening is more pronounced near the dome of the urinary bladder. This may represent a cystitis. Cystic neoplasm is not excluded. 2. Acute/subacute 40% superior endplate fracture at L2 demonstrates 40% loss of height anteriorly. No significant retropulsed bone is present. 3. 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 4. Chronic renal atrophy. 5. Colonic diverticulosis without diverticulitis. 6. Coronary artery disease. 7.  Aortic Atherosclerosis (ICD10-I70.0). Electronically Signed   By: Marin Roberts M.D.   On: 10/24/2022 11:20   DG Chest Port 1 View  Result Date: 10/24/2022 CLINICAL DATA:  72 year old female with altered mental status. EXAM: PORTABLE CHEST 1 VIEW COMPARISON:  Portable chest 02/24/2022. FINDINGS: Portable AP semi upright view at 0728 hours. Low lung volumes out significantly changed. Mediastinal contours remain within normal limits. Visualized tracheal air column is within normal limits. Allowing for portable technique the lungs are clear. No  pneumothorax or pleural effusion. Paucity bowel gas in the visible abdomen. No acute osseous abnormality identified. IMPRESSION: Low lung volumes.  No acute cardiopulmonary abnormality. Electronically Signed   By: Odessa Fleming M.D.   On: 10/24/2022 08:32   CT HEAD WO CONTRAST  Result Date: 10/24/2022 CLINICAL DATA:  72 year old female with altered mental status. EXAM: CT HEAD WITHOUT CONTRAST TECHNIQUE: Contiguous axial images were obtained from the base of the skull through the vertex without intravenous contrast. RADIATION DOSE REDUCTION: This exam was performed according to the departmental dose-optimization program which includes automated exposure control, adjustment of the mA and/or kV according to patient size and/or use of iterative reconstruction technique. COMPARISON:  Head CT 02/23/2022. FINDINGS: Brain: Stable cerebral volume, within normal limits for age. No midline shift, ventriculomegaly, mass effect, evidence of mass lesion, intracranial hemorrhage or evidence of cortically based acute infarction. Mild vascular calcifications in the basal ganglia. Gray-white differentiation within normal limits for age. Vascular: Extensive Calcified atherosclerosis at the skull base. No suspicious intracranial vascular hyperdensity. Skull: No acute osseous abnormality identified. Sinuses/Orbits: Paranasal sinus mucosal thickening bilaterally not significantly changed. Tympanic cavities and mastoids well aerated. Other: No acute orbit or scalp soft tissue finding. There is some calcified scalp vessel atherosclerosis. IMPRESSION: 1. No acute  intracranial abnormality. 2. Advanced calcified atherosclerosis, negative for age noncontrast CT appearance of the brain. 3. Mild chronic paranasal sinus disease. Electronically Signed   By: Odessa Fleming M.D.   On: 10/24/2022 08:31    Pending Labs Unresulted Labs (From admission, onward)    None       Vitals/Pain Today's Vitals   10/24/22 0845 10/24/22 0930 10/24/22 0945  10/24/22 1134  BP: (!) 105/46 100/74 119/67   Pulse: 78 88 86 86  Resp: (!) 23 (!) 23 (!) 27 20  Temp:      TempSrc:      SpO2: 100% 100% 100% 100%    Isolation Precautions No active isolations  Medications Medications  lactated ringers bolus 1,000 mL (0 mLs Intravenous Stopped 10/24/22 1056)  cefTRIAXone (ROCEPHIN) 1 g in sodium chloride 0.9 % 100 mL IVPB (0 g Intravenous Stopped 10/24/22 1322)    Mobility non-ambulatory     Focused Assessments    R Recommendations: See Admitting Provider Note  Report given to:   Additional Notes:

## 2022-10-24 NOTE — Assessment & Plan Note (Deleted)
Patient less responsive than baseline.  Has longstanding Alzheimer's dementia. Unable to regularly follow commands.  No visible head trauma.  UA suggests UTI.  CT head negative. CT renal: Diffuse urinary bladder wall thickening suspicious for cystitis. CXR negative.  BUN slightly to 28.  AST/ALT WNL.  Patient will need inpatient admission for further workup. - Order EEG. - Awaiting UDS. - Low threshold to consider MRI brain if focal findings develop. - NPO, SLP eval ordered.   - Delirium precautions. - Repeat BMP 10/13. - Continue treatment for UTI as below.

## 2022-10-24 NOTE — Evaluation (Signed)
Clinical/Bedside Swallow Evaluation Patient Details  Name: Emma Maldonado MRN: [id number] Date of Birth: [birth year]  Today's Date: 10/24/2022 Time: SLP Start Time (ACUTE ONLY): 1509 SLP Stop Time (ACUTE ONLY): 1522 SLP Time Calculation (min) (ACUTE ONLY): 13 min  Past Medical History:  Past Medical History:  Diagnosis Date   Dementia (HCC)    Diabetes mellitus without complication (HCC)    Hypertension    Renal disorder    Renal insufficiency    Past Surgical History:  Past Surgical History:  Procedure Laterality Date   ABDOMINAL HYSTERECTOMY     BIOPSY  12/28/2020   Procedure: BIOPSY;  Surgeon: Kathi Der, MD;  Location: [facility];  Service: Gastroenterology;;   CHOLECYSTECTOMY     FLEXIBLE SIGMOIDOSCOPY N/A 12/28/2020   Procedure: FLEXIBLE SIGMOIDOSCOPY;  Surgeon: Kathi Der, MD;  Location: [facility];  Service: Gastroenterology;  Laterality: N/A;   NEPHRECTOMY TRANSPLANTED ORGAN     HPI:  72 yr old from [facility] facility d/t AMS/unresponsive. Pt was found by staff laying in bed unconscious only responsive to painful stimuli. Pt's last seen normal was yesterday breakfast time. CXR Low lung volumes. No acute cardiopulmonary abnormality. Pt has dementia, DM, renal insufficiency, HTN.    Assessment / Plan / Recommendation  Clinical Impression  Pt exhibited an oral dysphagia marked by mildly prolonged mastication and transit. RN provided as interpreter during assessment. There was no labial or lingual asymmetry noted and pt has upper and lower dentures. She consumed close to 3 oz water consecutively via straw without s/s aspiration x 2. Functional transit and initiation of applesauce. Pt was slower to masticate and transit solid texture but able to clear oral cavity. At one point she appeared to be masticating with nothing in her mouth possibly due to her dementia. Recommend Dys 3 (chopped meats), thin liquids, meds crushed, ST will follow briefly for  ability to upgrade to regular texture. SLP Visit Diagnosis: Dysphagia, unspecified (R13.10)    Aspiration Risk  Mild aspiration risk    Diet Recommendation Dysphagia 3 (Mech soft);Thin liquid    Liquid Administration via: Cup;Straw Medication Administration: Crushed with puree Supervision: Staff to assist with self feeding Compensations: Slow rate;Small sips/bites Postural Changes: Seated upright at 90 degrees    Other  Recommendations Oral Care Recommendations: Oral care BID    Recommendations for follow up therapy are one component of a multi-disciplinary discharge planning process, led by the attending physician.  Recommendations may be updated based on patient status, additional functional criteria and insurance authorization.  Follow up Recommendations No SLP follow up      Assistance Recommended at Discharge    Functional Status Assessment Patient has had a recent decline in their functional status and demonstrates the ability to make significant improvements in function in a reasonable and predictable amount of time.  Frequency and Duration min 1 x/week  1 week       Prognosis Prognosis for improved oropharyngeal function: Good Barriers to Reach Goals: Cognitive deficits      Swallow Study   General Date of Onset: 10/24/22 HPI: 72 yr old from [facility] facility d/t AMS/unresponsive. Pt was found by staff laying in bed unconscious only responsive to painful stimuli. Pt's last seen normal was yesterday breakfast time. CXR Low lung volumes. No acute cardiopulmonary abnormality. Pt has dementia, DM, renal insufficiency, HTN. Type of Study: Bedside Swallow Evaluation Previous Swallow Assessment: none Diet Prior to this Study: NPO Temperature Spikes Noted: Yes Respiratory Status: Room air History of Recent Intubation:  No Behavior/Cognition: Alert;Cooperative;Requires cueing Oral Cavity Assessment: Within Functional Limits Oral Care Completed by SLP: No Oral Cavity  - Dentition: Dentures, top;Dentures, bottom Vision: Functional for self-feeding Self-Feeding Abilities: Needs assist (due to tremors) Patient Positioning: Upright in bed Baseline Vocal Quality:  (only phonated "ah") Volitional Cough: Cognitively unable to elicit Volitional Swallow: Unable to elicit    Oral/Motor/Sensory Function Overall Oral Motor/Sensory Function:  (only stuck out her tongue- no labial or lingual asymmetry)   Ice Chips Ice chips: Not tested   Thin Liquid Thin Liquid: Within functional limits Presentation: Straw    Nectar Thick Nectar Thick Liquid: Not tested   Honey Thick Honey Thick Liquid: Not tested   Puree Puree: Not tested   Solid     Solid: Impaired Oral Phase Functional Implications: Prolonged oral transit Pharyngeal Phase Impairments:  (none)      Emma Maldonado, Emma Maldonado 10/24/2022,3:49 PM

## 2022-10-24 NOTE — ED Triage Notes (Signed)
Pt BIB [facility] from [facility] facility d/t AMS/unresponsive. Pt was found by staff laying in bed unconscious only responsive to painful stimuli. Pt's last seen normal was yesterday breakfast time. Pt has dementia at baseline. BP 90/44 which is pt's baseline. Pt is DNR.

## 2022-10-24 NOTE — Sepsis Progress Note (Signed)
Sepsis protocol is being followed by [facility]. Antibiotics started prior to blood cultures and prior to sepsis protocol being ordered.

## 2022-10-24 NOTE — Progress Notes (Signed)
   10/24/22 1704  Vitals  BP (!) 85/39  MAP (mmHg) (!) 52  BP Location Left Arm  BP Method Automatic  Patient Position (if appropriate) Lying  Pulse Rate Source Dinamap  MEWS Score  MEWS Temp 2  MEWS Systolic 1  MEWS Pulse 0  MEWS RR 1  MEWS LOC 2  MEWS Score 6  MEWS Score Color Red     RRT at bedside.

## 2022-10-24 NOTE — Progress Notes (Signed)
Patient's husband called and stated "tell Dr. Phineas Real that I do not want him to tell my daughter what we talked about downstairs. It is confidential." However, Patient's husband states that the daughter will be calling the unit with medical history as he is a poor historian.

## 2022-10-24 NOTE — H&P (Addendum)
Hospital Admission History and Physical Service Pager: [phone]  Patient name: Emma Maldonado Medical record number: [id number] Date of Birth: 1950-07-17 Age: 72 y.o. Gender: female  Primary Care Provider: Pcp, No Consultants: None Code Status: DNR Preferred Emergency Contact: Daughter, [name] POA  Chief Complaint: AMS  Assessment and Plan: [name] is a 72 y.o. female presenting with AMS after an unwitnessed fall from her nursing facility.   Differential for this patient's presentation of this includes: Infectious etiology: most likely, in setting of known UTI/previous history of repeated admissions for UTI-related AMS CVA/TIA: less likely, in setting of negative CT head and no obvious focal findings. Metabolic: less likely given glucose WNL, ammonia undetectable, AST/ALT WNL, however, BUN of 26 is slightly high. Trauma: possible in setting of unwitnessed fall, less likely given head CT. Seizure: less likely in absence of seizure history, difficult given unwitnessed fall Drugs/substance use: less likely inappropriate administration, however we do not as yet have a reliable med list. Assessment & Plan Altered mental status Patient less responsive than baseline.  Has longstanding Alzheimer's dementia which is likely confounding presentation. Unable to regularly follow commands.  No visible head trauma.  UA suggests UTI.  CT head negative. CT renal: Diffuse urinary bladder wall thickening suspicious for cystitis. CXR negative.  BUN slightly elevated to 28.  AST/ALT WNL.  Patient will need inpatient admission for further workup of altered mental status. - Order EEG. - Awaiting UDS. - SLP eval: start dysphagia 3 diet.  Crushed meds by mouth  - Delirium precautions. - Repeat BMP 10/13. - Continue treatment for UTI as below. - Ordered reversible causes of dementia labs: B12/RPR/TSH/HIV - Ordered COVID test UTI (urinary tract infection) Has presented to the  hospital with similar symptoms twice over the past 2 years, most recently this past February.  Successfully treated with IV antibiotics and fluids.  Febrile to 103.3 F on this admission.  Hypotensive to 87/42 (baseline 90s/60s).  As above, use UA suggest UTI: +LE, +nitrites, WBC >50. Received IV ceftriaxone 1g x1 in the ED. Rapid called for BPs into 80s over 30s - code sepsis initiated.  - 1 L IV LR bolus initiated - Received acetaminophen 650 mg PR - Continue 1g IV rocephin every day, seven day course (10/12-10/19) - Blood cultures ordered - Await urine cultures AKI (acute kidney injury) (HCC) Cr: 2.32 ? 2.20.  BUN: 26 ? 28.  Pre versus post renal.  Has urinary retention at baseline.  Requires I/O caths at ALF. S/p I/O cath x1 on ED - Began IV LR at 1.5x maintenance fluids (150 cc/hour) - Bladder scans every 4 hours - In and out cath as needed, can place Foley if persistent retention Hypokalemia K: 3.4 ? 3.2.  Likely in the setting of decreased p.o. - Continue potassium chloride packets 20 mEq twice daily  Fracture of L2 vertebra (HCC) Likely in setting of osteoporosis which patient is known to have.  Will continue p.o. Tylenol 500 mg every 4 as needed for pain.  Once mental status clears, can consider altering pain regimen.  Unlikely to need Ortho/neurosurgical intervention.  Chronic and Stable Conditions: Right kidney transplant: Continue tacrolimus 0.5 daily, PO prednisone 5 mg  FEN/GI: dysphagia 3, crushed PO as able depending on waxing and waning mental status VTE Prophylaxis: Subcu Lovenox renally dosed  Disposition: Med-surg  History of Present Illness:  Emma Maldonado is a 72 y.o. female presenting with AMS. Her nursing facility and daughter were contacted for  collateral since patient is mostly noncommunicative.  Just had COVID vaccine yesterday. Wonders if that is causing some symptoms. She fell yesterday evening after this, but nursing staff is unsure if this was  witnessed/she hit her head. She had a fever overnight to 101.2 as well that they gave tylenol for. She became more noncommunicative after this.  At baseline, she can walk by herself, though she is not supposed to. Nursing staff states that she usually does urinate on her own, but they have an order to cath as needed. She has not had a cath in a while.  Ultimately, nursing staff unable to provide further history due to other duties at the home today.  Also had spoken with husband who is apparently not patient's healthcare power of attorney.  It appears her daughter is.  There exists some tension between the husband and daughter, as it appears the husband is trying to get legal action to get the daughter and son-in-law out of the home.  Of note, he has not seen the patient but 6 times in the last 2 years.  He states he is scared to see her.  Of note, had COVID a couple weeks ago and got remdesevir for this.  In the ED, patient was hemodynamically stable.  UA positive for leukocyte esterase, nitrates.  White blood cells present.  Patient had leukocytosis to 18.5.  Evidence of AKI: Creatinine of 2.3 from baseline of 0.5.  CT head negative.  Chest x-ray negative.  CT renal: No acute obstructive process but did show thickened bladder consistent with cystitis.  CT also with acute/subacute superior endplate fracture at L2.  EKG: NSR.  Received empiric IV ceftriaxone 1g for presumed UTI.  Was admitted to the [facility] medicine teaching service for AMS in the setting of acute cystitis without hematuria and acute kidney injury.  Review Of Systems: Per HPI.  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   Pertinent Past Surgical History: Status post kidney transplant Abdominal hysterectomy Cholecystectomy Remainder reviewed in history tab.   Pertinent Social History: Tobacco use: Never Alcohol use: No Other Substance use: No Lives in assisted living facility.   Pertinent Family History: Father-colon cancer, type 2 diabetes Brother-colon cancer Remainder reviewed in history tab.   Important Outpatient Medications: Loperamide 2 mg Q4H prn loose stools, has been taking daily Tylenol 650 mg Q4H prn Cholestyramine 4 g twice daily Losartan 50 daily Crestor 5 daily at bedtime Memantine 10 mg twice daily Fludrocortisone 0.2 mg nightly Humalog 0 to 10 units 3 times daily with meals and bedtime Tresiba 60 units daily Levothyroxine 125 mcg daily before breakfast Prednisone 5 mg in the morning Loperamide 2 mg every 4 as needed diarrhea Magnesium hydroxide 30 mL daily as needed constipation Bethanechol 25 mg 3 times daily Tacrolimus 0.5 daily Vitamin D3 5000 units daily MVI 1 tablet daily Potassium chloride 20 mEq twice daily  Objective: BP 119/67   Pulse 86   Temp 97.9 F (36.6 C) (Axillary)   Resp 20   SpO2 100%  Exam: General: Lying in bed, NAD Eyes: EOMI, PERRLA though constricted at baseline to 3-4 mm ENTM: White lacy plaques along gum line, dentures in place, no oropharyngeal erythema Cardiovascular: RRR without m/r/g Respiratory: CTAB anteriorly without  w/r/r Gastrointestinal: Soft, nontender, nondistended, normoactive bowel sounds Neuro: CN 3,4,6 intact, other CN difficult to assess, strength at least 3/5 in bilateral lower extremities as able to lift both off the bed, sensation unable to be tested Psych: Alert, not oriented, largely noncommunicative but able to nod yes to questions and able to mumble some answers to questions though largely unintelligible  Labs:  CBC BMET  Recent Labs  Lab  10/24/22 0750 10/24/22 0758 10/24/22 0759  WBC 18.5*  --   --   HGB 11.6*   < > 11.6*  HCT 37.2   < > 34.0*  PLT 180  --   --    < > = values in this interval not displayed.   Recent Labs  Lab 10/24/22 0750 10/24/22 0758 10/24/22 0759  NA 142 143 143  K 3.4* 3.2* 3.1*  CL 107 109  --   CO2 21*  --   --   BUN 26* 28*  --   CREATININE 2.32* 2.20*  --   GLUCOSE 80 79  --   CALCIUM 9.2  --   --      Urinalysis    Component Value Date/Time   COLORURINE YELLOW 10/24/2022 1018   APPEARANCEUR CLEAR 10/24/2022 1018   LABSPEC 1.020 10/24/2022 1018   PHURINE 6.0 10/24/2022 1018   GLUCOSEU NEGATIVE 10/24/2022 1018   HGBUR TRACE (A) 10/24/2022 1018   BILIRUBINUR NEGATIVE 10/24/2022 1018   KETONESUR NEGATIVE 10/24/2022 1018   PROTEINUR 30 (A) 10/24/2022 1018   UROBILINOGEN 1.0 08/14/2012 1022   NITRITE POSITIVE (A) 10/24/2022 1018   LEUKOCYTESUR LARGE (A) 10/24/2022 1018   VBG with pH 7.381, pCO2 34.5, pO2 31 Lactic acid 2.1-1.4 Ammonia less than 10  EKG: My own interpretation (not copied from electronic read) rate 80s, normal sinus rhythm, regular rhythm, no evidence of STEMI/new bundle branch block  Imaging Studies Performed:  CXR IMPRESSION: Low lung volumes.  No acute cardiopulmonary abnormality.  CT head wo contrast IMPRESSION: 1. No acute intracranial abnormality. 2. Advanced calcified atherosclerosis, negative for age noncontrast CT appearance of the brain. 3. Mild chronic paranasal sinus disease.  CT renal stone IMPRESSION: 1. Diffuse urinary bladder wall thickening is more pronounced near the dome of the urinary bladder. This may represent a cystitis. Cystic neoplasm is not excluded. 2. Acute/subacute 40% superior endplate fracture at L2 demonstrates 40% loss of height anteriorly. No significant retropulsed bone is present. 3. 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 4. Chronic renal atrophy. 5. Colonic diverticulosis  without diverticulitis. 6. Coronary artery disease. 7. Aortic Atherosclerosis (ICD10-I70.0).  Tomie China, MD PGY-1, [facility] Family Medicine FPTS Intern pager: [phone], text pages welcome Secure chat group [facility] Teaching Service   I was personally present and performed or re-performed the history, physical exam and medical decision making activities of this service and have verified that the service and findings are accurately documented in the resident's note.  Janeal Holmes, MD                  10/24/2022, 6:15 PM

## 2022-10-24 NOTE — Progress Notes (Signed)
Presented to bedside at request of RN for lowering BPs and altered level of consciousness.  BP 85/39 with map of 52.  Patient unarousable though did withdrawal to pain.  1 L bolus initiated.  After around 10 minutes patient opened her eyes and was back to baseline that I witnessed on admission.  However, appears patient was speaking and more alert with RN on arrival to floor.  Rapid was called as well as PCCM to come evaluate, though patient was improving by both of their arrival. Suspect waxing and waning mental status in setting of infection and underlying dementia.  Continue fluids.  Code sepsis initiated.

## 2022-10-24 NOTE — Assessment & Plan Note (Addendum)
Cr: 2.32 ? 2.20.  BUN: 26 ? 28.  Pre versus post renal.  Has urinary retention at baseline.  Requires I/O caths at ALF. S/p I/O cath x1 on ED - Began IV LR at 1.5x maintenance fluids (150 cc/hour) - Bladder scans every 4 hours - In and out cath as needed, can place Foley if persistent retention

## 2022-10-24 NOTE — Progress Notes (Signed)
Mb arrived to room for EEG and Dr asked to Hold off/ Cancel EEG for now. I requested a Cancel order to be placed.

## 2022-10-24 NOTE — Assessment & Plan Note (Addendum)
Has presented to the hospital with similar symptoms twice over the past 2 years, most recently this past February.  Successfully treated with IV antibiotics and fluids.  Febrile to 103.3 F on this admission.  Hypotensive to 87/42 (baseline 90s/60s).  As above, use UA suggest UTI: +LE, +nitrites, WBC >50. Received IV ceftriaxone 1g x1 in the ED. Rapid called for BPs into 80s over 30s - code sepsis initiated.  - 1 L IV LR bolus initiated - Received acetaminophen 650 mg PR - Continue 1g IV rocephin every day, seven day course (10/12-10/19) - Blood cultures ordered - Await urine cultures

## 2022-10-24 NOTE — Assessment & Plan Note (Addendum)
Patient less responsive than baseline.  Has longstanding Alzheimer's dementia which is likely confounding presentation. Unable to regularly follow commands.  No visible head trauma.  UA suggests UTI.  CT head negative. CT renal: Diffuse urinary bladder wall thickening suspicious for cystitis. CXR negative.  BUN slightly elevated to 28.  AST/ALT WNL.  Patient will need inpatient admission for further workup of altered mental status. - Order EEG. - Awaiting UDS. - SLP eval: start dysphagia 3 diet.  Crushed meds by mouth  - Delirium precautions. - Repeat BMP 10/13. - Continue treatment for UTI as below. - Ordered reversible causes of dementia labs: B12/RPR/TSH/HIV - Ordered COVID test

## 2022-10-24 NOTE — Assessment & Plan Note (Deleted)
History of urinary retention.  Febrile to 103.3 F on admission.  Hypotensive to 87/42 (baseline 90s over 60s).  As above, use UA suggest UTI: +LE, +nitrites, WBC >50.  Has presented to the hospital with similar symptoms twice over the past 2 years most recently this past February.  Required in and out cath. Received IV ceftriaxone 1g x1 in the ED. - Continue empiric IV ceftriaxone 1g daily - Await urine cultures - As needed IV acetaminophen 1g every 6 hours - In and out cath as needed - Bladder scans every 6 hours

## 2022-10-24 NOTE — Progress Notes (Signed)
Hypoglycemic Event  CBG: 65   Treatment: D50 50 mL (25 gm)  Symptoms: Pale, Shaky, and Nervous/irritable  Follow-up CBG: Time: 1519 CBG Result: 206  Possible Reasons for Event: Inadequate meal intake and Change in activity  Comments/MD notified: Primary Team informed. See new orders.  Primary RN at bedside.     Silverio Decamp RN

## 2022-10-24 NOTE — Progress Notes (Signed)
FMTS Interim Progress Note  S:Night team evaluated pt. She is resting comfortably at this time. Required an I&O cath for bladder scan 500cc.   O: BP (!) 110/48 (BP Location: Left Arm)   Pulse (!) 102   Temp (!) 103.1 F (39.5 C)   Resp 12   Ht 5' (1.524 m)   SpO2 96%   BMI 30.23 kg/m   Gen: Sleeping comfortably in bed. NAD. HEENT: NCAT. Resp: CTAB, no crackles. Normal WOB on RA. CV: RRR, no murmurs. Ext: No LE edema in BL LE. BL feet warm, dry, well perfused. 2+ BL dorsalis pedis pulses.  A/P:  UTI  Urinary retention Currently afebrile. Lactic acidosis now wnl. Previously grew pansensitive E coli on 02/23/22 Ucx and pansensitive enterococcus faecalis 01/01/21. Pt currently on CTX, which should be adequate coverage, and will await Ucx results. Pt also required I&O cath x2 today for urinary retention, plan to place foley if pt requires cath again. - Monitor fever curve. - Bladder scan q4h, place foley if needing another cath - Cont CTX - Cont tylenol for fevers  Low Blood Pressure Had MAP 52 earlier, improved w/ 1L bolus. Currently on mIVF 150cc/hr. Extremities warm and well perfused on exam. No signs of fluid overload on exam. - Cont LR 150cc/hr mIVF - If c/f fluid overload or resp distress, consider CXR - If MAPs consistently <60, give another 1L bolus. Could also consider VBG or LA.   Emma Brigham, MD 10/24/2022, 7:48 PM PGY-2, [facility] Family Medicine Service pager [phone]

## 2022-10-24 NOTE — ED Provider Notes (Signed)
[place] [facility]ERGENCY DEPARTMENT AT [facility] Provider Note   CSN: [id number] Arrival date & time: 10/24/22  4098     History  Chief Complaint  Patient presents with  . Altered Mental Status    Emma Maldonado is a 72 y.o. female.   Altered Mental Status    Patient has a history of diabetes hypertension dementia kidney disease.  Patient is a resident of collapse nursing facility.  Patient was found unresponsive this morning.  Patient was last seen normal yesterday at breakfast time. Patient has previously been admitted to the hospital for encephalopathy associated with urinary tract infection.  Patient is not able to provide any history on exam Home Medications Prior to Admission medications   Medication Sig Start Date End Date Taking? Authorizing Provider  acetaminophen (TYLENOL) 500 MG tablet Take 500 mg by mouth every 6 (six) hours as needed (for pain).    [provider]  amLODipine (NORVASC) 5 MG tablet Take 5 mg by mouth daily.    [provider]  bethanechol (URECHOLINE) 25 MG tablet Take 25 mg by mouth 3 (three) times daily.    [provider]  Cholecalciferol (VITAMIN D3) 125 MCG (5000 UT) TABS Take 5,000 Units by mouth daily.    [provider]  cholestyramine (QUESTRAN) 4 g packet Take 4 g by mouth See admin instructions. Mix 4 grams as directed and take 2 times a day- should be taken two hours before or after any other medications    [provider]  fludrocortisone (FLORINEF) 0.1 MG tablet Take 0.2 mg by mouth at bedtime.    [provider]  HUMALOG 100 UNIT/ML injection Inject 0-10 Units into the skin See admin instructions. Inject 0-10 units into the skin three times a day before meals AND at bedtime, per sliding scale: BGL 0-149 = give nothing; 150-200 = 2 units; 201-250 = 4 units; 251-300 = 6 units; 301-350 = 8 units; 351-400 = 10 units; 400-1,000 = 10 units and CALL MD!    [provider]   Infant Care Products [facility]) OINT Apply 1 application  topically See admin instructions. Apply to groin/buttocks 3 times a day and after each episode of incontinence    [provider]  LANTUS SOLOSTAR 100 UNIT/ML Solostar Pen Inject 20 Units into the skin in the morning. 02/26/22   Emma Bonds, MD  levothyroxine (SYNTHROID) 100 MCG tablet Take 100 mcg by mouth daily before breakfast. 03/04/20   [provider]  loperamide (IMODIUM) 2 MG capsule Take 2 mg by mouth every 4 (four) hours as needed (for diarrhea). 02/20/20   [provider]  losartan (COZAAR) 100 MG tablet Take 100 mg by mouth in the morning.    [provider]  magnesium hydroxide (MILK OF MAGNESIA) 400 MG/5ML suspension Take 30 mLs by mouth daily as needed (for no B/M after 3 days).    [provider]  memantine (NAMENDA) 10 MG tablet Take 10 mg by mouth every 12 (twelve) hours.    [provider]  Multiple Vitamin (MULTIVITAMIN WITH MINERALS) TABS tablet Take 1 tablet by mouth daily. 08/26/20   Emma Hercules, MD  predniSONE (DELTASONE) 5 MG tablet Take 5 mg by mouth every morning.    [provider]  rosuvastatin (CRESTOR) 5 MG tablet Take 5 mg by mouth at bedtime. 12/01/19   [provider]  tacrolimus (PROGRAF) 0.5 MG capsule Take 1 capsule (0.5 mg total) by mouth daily. 03/28/12  Dhungel, Theda Belfast, MD      Allergies    Patient has no known allergies.    Review of Systems   Review of Systems  Physical Exam Updated Vital Signs BP 119/67   Pulse 86   Temp 97.9 F (36.6 C) (Axillary)   Resp (!) 27   SpO2 100%  Physical Exam Vitals and nursing note reviewed.  Constitutional:      Appearance: She is well-developed. She is not diaphoretic.     Comments: Elderly, frail  HENT:     Head: Normocephalic and atraumatic.     Right Ear: External ear normal.     Left Ear: External ear normal.  Eyes:     General: No scleral icterus.       Right eye:  No discharge.        Left eye: No discharge.     Conjunctiva/sclera: Conjunctivae normal.     Pupils: Pupils are equal, round, and reactive to light.  Neck:     Trachea: No tracheal deviation.  Cardiovascular:     Rate and Rhythm: Normal rate and regular rhythm.  Pulmonary:     Effort: Pulmonary effort is normal. No respiratory distress.     Breath sounds: Normal breath sounds. No stridor. No wheezing or rales.  Abdominal:     General: Bowel sounds are normal. There is no distension.     Palpations: Abdomen is soft.     Tenderness: There is no abdominal tenderness. There is no guarding or rebound.  Musculoskeletal:        General: No tenderness or deformity.     Cervical back: Neck supple.     Right lower leg: No edema.     Left lower leg: No edema.  Skin:    General: Skin is warm and dry.     Findings: No rash.  Neurological:     General: No focal deficit present.     Mental Status: She is unresponsive.     Cranial Nerves: No cranial nerve deficit.     Motor: No abnormal muscle tone or seizure activity.     Comments: Patient does not follow commands, moves slightly in response to voice, moving extremities in response to painful stimuli.  Appears to be moving left side more than right  Psychiatric:        Mood and Affect: Mood normal.     ED Results / Procedures / Treatments   Labs (all labs ordered are listed, but only abnormal results are displayed) Labs Reviewed  COMPREHENSIVE METABOLIC PANEL - Abnormal; Notable for the following components:      Result Value   Potassium 3.4 (*)    CO2 21 (*)    BUN 26 (*)    Creatinine, Ser 2.32 (*)    Total Protein 5.7 (*)    Albumin 2.6 (*)    Total Bilirubin 1.4 (*)    GFR, Estimated 22 (*)    All other components within normal limits  CBC WITH DIFFERENTIAL/PLATELET - Abnormal; Notable for the following components:   WBC 18.5 (*)    Hemoglobin 11.6 (*)    MCH 25.2 (*)    Neutro Abs 17.4 (*)    Monocytes Absolute 0.0 (*)     Basophils Absolute 0.2 (*)    All other components within normal limits  URINALYSIS, ROUTINE W REFLEX MICROSCOPIC - Abnormal; Notable for the following components:   Hgb urine dipstick TRACE (*)    Protein, ur 30 (*)    Nitrite  POSITIVE (*)    Leukocytes,Ua LARGE (*)    All other components within normal limits  URINALYSIS, MICROSCOPIC (REFLEX) - Abnormal; Notable for the following components:   Bacteria, UA RARE (*)    All other components within normal limits  I-STAT CHEM 8, ED - Abnormal; Notable for the following components:   Potassium 3.2 (*)    BUN 28 (*)    Creatinine, Ser 2.20 (*)    TCO2 20 (*)    Hemoglobin 11.2 (*)    HCT 33.0 (*)    All other components within normal limits  I-STAT CG4 LACTIC ACID, ED - Abnormal; Notable for the following components:   Lactic Acid, Venous 2.1 (*)    All other components within normal limits  I-STAT VENOUS BLOOD GAS, ED - Abnormal; Notable for the following components:   pCO2, Ven 34.5 (*)    pO2, Ven 31 (*)    Acid-base deficit 4.0 (*)    Potassium 3.1 (*)    HCT 34.0 (*)    Hemoglobin 11.6 (*)    All other components within normal limits  AMMONIA  CBG MONITORING, ED  CBG MONITORING, ED  I-STAT CG4 LACTIC ACID, ED    EKG EKG Interpretation Date/Time:  Saturday October 24 2022 07:14:06 EDT Ventricular Rate:  82 PR Interval:    QRS Duration:  83 QT Interval:  401 QTC Calculation: 469 R Axis:   65  Text Interpretation: Sinus rhythm No significant change since last tracing Confirmed by Linwood Dibbles [phone]) on 10/24/2022 7:16:42 AM  Radiology CT Renal Stone Study  Result Date: 10/24/2022 CLINICAL DATA:  Abdominal/flank pain. Stone suspected. Patient was found with altered mental status unresponsive at nursing home today. EXAM: CT ABDOMEN AND PELVIS WITHOUT CONTRAST TECHNIQUE: Multidetector CT imaging of the abdomen and pelvis was performed following the standard protocol without IV contrast. RADIATION DOSE REDUCTION: This  exam was performed according to the departmental dose-optimization program which includes automated exposure control, adjustment of the mA and/or kV according to patient size and/or use of iterative reconstruction technique. COMPARISON:  CT of the abdomen and pelvis without contrast 01/02/2022 FINDINGS: Lower chest: Linear atelectasis or scarring is present at the lung bases. No significant airspace consolidation is present. Coronary artery calcifications are present. Calcifications are present at the aortic valve and mitral annulus. No pleural or pericardial effusion is present. Hepatobiliary: No focal liver abnormality is seen. Status post cholecystectomy. No biliary dilatation. Pancreas: Pancreas is moderately atrophic, stable. No discrete lesions are present. Spleen: Normal in size without focal abnormality. Adrenals/Urinary Tract: Adrenal glands are normal bilaterally. Chronic renal atrophy is present. No discrete lesions are present. No stone or obstruction is present. A transplant kidney is present in the right lower quadrant. No stone or mass lesion is present. Ureter is within normal limits. Diffuse urinary bladder wall thickening is present. Elbow no discrete mass lesion is present. The thickening is more pronounced near the dome of the urinary bladder. Stomach/Bowel: The stomach and duodenum are within normal limits. The small bowel is unremarkable. The terminal ileum is within normal limits. The appendix is visualized and normal. The ascending and transverse colon are within normal limits. Diverticular changes are present in the descending and sigmoid colon. No focal inflammatory changes are present to suggest diverticulitis. Vascular/Lymphatic: Atherosclerotic calcifications are present within the aorta and branch vessels. No aneurysm is present. No significant adenopathy is present. Reproductive: Status post hysterectomy. No adnexal masses. Other: No abdominal wall hernia or abnormality. No  abdominopelvic ascites.  Subcutaneous scratched at paraumbilical subcutaneous densities with central calcification are stable. Musculoskeletal: A superior endplate fracture at L2 demonstrates 40% loss of height anteriorly. No significant retropulsed bone is present. Vertebral body heights and alignment are otherwise normal. Straightening of the normal lumbar lordosis is present. Disc disease is most pronounced at L4-5 and L5-S1. The bony pelvis is within normal limits. The hips are located and normal bilaterally. IMPRESSION: 1. Diffuse urinary bladder wall thickening is more pronounced near the dome of the urinary bladder. This may represent a cystitis. Cystic neoplasm is not excluded. 2. Acute/subacute 40% superior endplate fracture at L2 demonstrates 40% loss of height anteriorly. No significant retropulsed bone is present. 3. 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 4. Chronic renal atrophy. 5. Colonic diverticulosis without diverticulitis. 6. Coronary artery disease. 7.  Aortic Atherosclerosis (ICD10-I70.0). Electronically Signed   By: Marin Roberts M.D.   On: 10/24/2022 11:20   DG Chest Port 1 View  Result Date: 10/24/2022 CLINICAL DATA:  72 year old female with altered mental status. EXAM: PORTABLE CHEST 1 VIEW COMPARISON:  Portable chest 02/24/2022. FINDINGS: Portable AP semi upright view at 0728 hours. Low lung volumes out significantly changed. Mediastinal contours remain within normal limits. Visualized tracheal air column is within normal limits. Allowing for portable technique the lungs are clear. No pneumothorax or pleural effusion. Paucity bowel gas in the visible abdomen. No acute osseous abnormality identified. IMPRESSION: Low lung volumes.  No acute cardiopulmonary abnormality. Electronically Signed   By: Odessa Fleming M.D.   On: 10/24/2022 08:32   CT HEAD WO CONTRAST  Result Date: 10/24/2022 CLINICAL DATA:  72 year old female with altered mental status. EXAM:  CT HEAD WITHOUT CONTRAST TECHNIQUE: Contiguous axial images were obtained from the base of the skull through the vertex without intravenous contrast. RADIATION DOSE REDUCTION: This exam was performed according to the departmental dose-optimization program which includes automated exposure control, adjustment of the mA and/or kV according to patient size and/or use of iterative reconstruction technique. COMPARISON:  Head CT 02/23/2022. FINDINGS: Brain: Stable cerebral volume, within normal limits for age. No midline shift, ventriculomegaly, mass effect, evidence of mass lesion, intracranial hemorrhage or evidence of cortically based acute infarction. Mild vascular calcifications in the basal ganglia. Gray-white differentiation within normal limits for age. Vascular: Extensive Calcified atherosclerosis at the skull base. No suspicious intracranial vascular hyperdensity. Skull: No acute osseous abnormality identified. Sinuses/Orbits: Paranasal sinus mucosal thickening bilaterally not significantly changed. Tympanic cavities and mastoids well aerated. Other: No acute orbit or scalp soft tissue finding. There is some calcified scalp vessel atherosclerosis. IMPRESSION: 1. No acute intracranial abnormality. 2. Advanced calcified atherosclerosis, negative for age noncontrast CT appearance of the brain. 3. Mild chronic paranasal sinus disease. Electronically Signed   By: Odessa Fleming M.D.   On: 10/24/2022 08:31    Procedures .Critical Care  Performed by: Linwood Dibbles, MD Authorized by: Linwood Dibbles, MD   Critical care provider statement:    Critical care time (minutes):  40   Critical care was time spent personally by me on the following activities:  Development of treatment plan with patient or surrogate, discussions with consultants, evaluation of patient's response to treatment, examination of patient, ordering and review of laboratory studies, ordering and review of radiographic studies, ordering and performing  treatments and interventions, pulse oximetry, re-evaluation of patient's condition and review of old charts     Medications Ordered in ED Medications  cefTRIAXone (ROCEPHIN) 1 g in sodium chloride 0.9 % 100 mL IVPB (has no  administration in time range)  lactated ringers bolus 1,000 mL (0 mLs Intravenous Stopped 10/24/22 1056)    ED Course/ Medical Decision Making/ A&P Clinical Course as of 10/24/22 1211  Sat Oct 24, 2022  0831 Venous blood gas shows decreased p02 decreased pCO2, metabolic panel shows elevated creatinine compared to previous [JK]  0940 Chest x-ray without acute findings [JK]  0940 Head CT without acute findings [JK]  1108 Urinalysis suggestive of a UTI [JK]  1122 Case discussed with Family medicine service regarding admission  [JK]  1122 Daughter is at the bedside and I reviewed all of her findings and the plan to admit to the hospital for further treatment [JK]  1211 CT Renal Stone Study [JK]  1211 CT scan does not show signs of ureteral obstruction [JK]    Clinical Course User Index [JK] Linwood Dibbles, MD                                 Medical Decision Making Differential diagnosis includes but not limited to metabolic encephalopathy acute stroke, metabolic derangement, infection  Problems Addressed: Acute cystitis without hematuria: acute illness or injury that poses a threat to life or bodily functions Metabolic encephalopathy: acute illness or injury that poses a threat to life or bodily functions  Amount and/or Complexity of Data Reviewed Labs: ordered. Decision-making details documented in ED Course. Radiology: ordered and independent interpretation performed.  Risk Decision regarding hospitalization.   Patient presented to ED for altered mental status.  Patient does have a prior history of urinary tract infection and metabolic encephalopathy.  Patient noted to have significant leukocytosis.  She also has elevation in her creatinine consistent with  acute kidney injury.  Urinalysis is consistent with infection.  Patient has been started on IV antibiotics and fluids.  CT scan has been ordered to rule out any ureteral obstruction.  No signs of acute abnormality on head CT.  Chest x-ray unremarkable.  Suspect her change in mental status is related to her urinary tract infection but we will need to monitor and make sure her symptoms improved.   Case discussed with family medicine service.  Will admit to the hospital for further treatment and evaluation.        Final Clinical Impression(s) / ED Diagnoses Final diagnoses:  Acute cystitis without hematuria  Metabolic encephalopathy    Rx / DC Orders ED Discharge Orders     None         Linwood Dibbles, MD 10/24/22 1125

## 2022-10-24 NOTE — Progress Notes (Signed)
   10/24/22 1502  Vitals  Temp (!) 103.3 F (39.6 C)  Temp Source Oral  BP (!) 139/59  MAP (mmHg) 110  BP Location Left Arm  BP Method Automatic  Patient Position (if appropriate) Lying  Pulse Rate (!) 110  Pulse Rate Source Dinamap  Resp 18  Level of Consciousness  Level of Consciousness Alert  Oxygen Therapy  SpO2 99 %  O2 Device Room Air  MEWS Score  MEWS Temp 2  MEWS Systolic 0  MEWS Pulse 1  MEWS RR 0  MEWS LOC 0  MEWS Score 3  MEWS Score Color Yellow     Primary team informed. CBG 65 treated with 1 amp of D50.   Ambient temperature adjusted, Ice applied.

## 2022-10-24 NOTE — Progress Notes (Signed)
FMTS ATTENDING ADMISSION NOTE Melba Araki,MD I  have seen and examined this patient, reviewed their chart. I have discussed this patient with the resident. I will cosign the resident's H&P once completed.  Note: We need to clarify who has the [facility] and have the document on file. It seems communication should be with her daughter and not her husband per my conversation with her daughter. The husband should not be the person to contact as he has been out of her life for many years per her daughter.

## 2022-10-24 NOTE — Assessment & Plan Note (Addendum)
K: 3.4 ? 3.2.  Likely in the setting of decreased p.o. - Continue potassium chloride packets 20 mEq twice daily

## 2022-10-24 NOTE — Assessment & Plan Note (Signed)
Likely in setting of osteoporosis which patient is known to have.  Will continue p.o. Tylenol 500 mg every 4 as needed for pain.  Once mental status clears, can consider altering pain regimen.  Unlikely to need Ortho/neurosurgical intervention.

## 2022-10-24 NOTE — ED Notes (Signed)
Helped with in and out cath cleaned patient up placed another adult brief patient is resting with call bell in reach

## 2022-10-24 NOTE — Hospital Course (Addendum)
Emma Maldonado is a 72 y.o. female who was admitted to the [facility] Medicine Teaching Service at [facility] for AMS in the setting of UTI. Hospital course is outlined below by problem.   AMS  Patient presented with a 1 day history of altered mental status after a unwitnessed fall in her ALF. She has urinary retention at baseline. Has presented to the hospital for urosepsis multiple times over the last 2 years.  Found to have UTI.  Cultures later returned ***. Started on empiric IV ceftriaxone 1 g/day.  Workup was negative for other etiologies: (CT head negative.  EEG negative.)  Later in day of admission, she began spiking temperatures to 103 with BPs and 80s over 30s. Code sepsis initiated. On discharge, patient was ***.  AKI  CR: 2.32 (BL 0.5).  Patient has urinary retention at baseline. Is in-and-out cathed occasional at her ALF. Begun on IV LR 150 cc/h (maintenance X1.5). Also received multiple boluses of LR for volume resuscitation. Bladder scans revealed ***.  At the end of her admission, patient's renal function improved ***  Hypokalemia  Potassium on admission 3.4 ? 3.2.  Likely in setting of decreased PO.  Repleted potassium throughout stay.  L2 vertebral fracture  Likely in the setting of previously diagnosed osteoporosis.  Pain meds: tylenol.  Other conditions that were chronic and stable: ***  Issues for follow up: ***

## 2022-10-25 DIAGNOSIS — Z794 Long term (current) use of insulin: Secondary | ICD-10-CM | POA: Diagnosis not present

## 2022-10-25 DIAGNOSIS — Z940 Kidney transplant status: Secondary | ICD-10-CM | POA: Diagnosis not present

## 2022-10-25 DIAGNOSIS — E2740 Unspecified adrenocortical insufficiency: Secondary | ICD-10-CM | POA: Diagnosis present

## 2022-10-25 DIAGNOSIS — E1165 Type 2 diabetes mellitus with hyperglycemia: Secondary | ICD-10-CM | POA: Diagnosis present

## 2022-10-25 DIAGNOSIS — F0280 Dementia in other diseases classified elsewhere without behavioral disturbance: Secondary | ICD-10-CM | POA: Diagnosis present

## 2022-10-25 DIAGNOSIS — M8008XA Age-related osteoporosis with current pathological fracture, vertebra(e), initial encounter for fracture: Secondary | ICD-10-CM | POA: Diagnosis present

## 2022-10-25 DIAGNOSIS — E876 Hypokalemia: Secondary | ICD-10-CM | POA: Diagnosis present

## 2022-10-25 DIAGNOSIS — R1310 Dysphagia, unspecified: Secondary | ICD-10-CM | POA: Diagnosis present

## 2022-10-25 DIAGNOSIS — G9341 Metabolic encephalopathy: Secondary | ICD-10-CM | POA: Diagnosis present

## 2022-10-25 DIAGNOSIS — Z66 Do not resuscitate: Secondary | ICD-10-CM | POA: Diagnosis present

## 2022-10-25 DIAGNOSIS — E860 Dehydration: Secondary | ICD-10-CM | POA: Diagnosis present

## 2022-10-25 DIAGNOSIS — A419 Sepsis, unspecified organism: Secondary | ICD-10-CM | POA: Diagnosis present

## 2022-10-25 DIAGNOSIS — I158 Other secondary hypertension: Secondary | ICD-10-CM | POA: Diagnosis not present

## 2022-10-25 DIAGNOSIS — G309 Alzheimer's disease, unspecified: Secondary | ICD-10-CM | POA: Diagnosis present

## 2022-10-25 DIAGNOSIS — Z8616 Personal history of COVID-19: Secondary | ICD-10-CM | POA: Diagnosis not present

## 2022-10-25 DIAGNOSIS — N3000 Acute cystitis without hematuria: Secondary | ICD-10-CM | POA: Diagnosis present

## 2022-10-25 DIAGNOSIS — R569 Unspecified convulsions: Secondary | ICD-10-CM | POA: Diagnosis not present

## 2022-10-25 DIAGNOSIS — I10 Essential (primary) hypertension: Secondary | ICD-10-CM | POA: Diagnosis present

## 2022-10-25 DIAGNOSIS — E785 Hyperlipidemia, unspecified: Secondary | ICD-10-CM | POA: Diagnosis present

## 2022-10-25 DIAGNOSIS — E039 Hypothyroidism, unspecified: Secondary | ICD-10-CM | POA: Diagnosis present

## 2022-10-25 DIAGNOSIS — E1122 Type 2 diabetes mellitus with diabetic chronic kidney disease: Secondary | ICD-10-CM | POA: Diagnosis present

## 2022-10-25 DIAGNOSIS — R4182 Altered mental status, unspecified: Secondary | ICD-10-CM | POA: Diagnosis not present

## 2022-10-25 DIAGNOSIS — E872 Acidosis, unspecified: Secondary | ICD-10-CM | POA: Diagnosis present

## 2022-10-25 DIAGNOSIS — W19XXXA Unspecified fall, initial encounter: Secondary | ICD-10-CM | POA: Diagnosis present

## 2022-10-25 DIAGNOSIS — Z79890 Hormone replacement therapy (postmenopausal): Secondary | ICD-10-CM | POA: Diagnosis not present

## 2022-10-25 DIAGNOSIS — Y92009 Unspecified place in unspecified non-institutional (private) residence as the place of occurrence of the external cause: Secondary | ICD-10-CM | POA: Diagnosis not present

## 2022-10-25 DIAGNOSIS — Z1152 Encounter for screening for COVID-19: Secondary | ICD-10-CM | POA: Diagnosis not present

## 2022-10-25 DIAGNOSIS — R6520 Severe sepsis without septic shock: Secondary | ICD-10-CM | POA: Diagnosis present

## 2022-10-25 DIAGNOSIS — N179 Acute kidney failure, unspecified: Secondary | ICD-10-CM | POA: Diagnosis present

## 2022-10-25 LAB — GLUCOSE, CAPILLARY
Glucose-Capillary: 156 mg/dL — ABNORMAL HIGH (ref 70–99)
Glucose-Capillary: 260 mg/dL — ABNORMAL HIGH (ref 70–99)
Glucose-Capillary: 226 mg/dL — ABNORMAL HIGH (ref 70–99)

## 2022-10-25 LAB — BASIC METABOLIC PANEL
Anion gap: 9 (ref 5–15)
BUN: 21 mg/dL (ref 8–23)
CO2: 21 mmol/L — ABNORMAL LOW (ref 22–32)
Calcium: 8.5 mg/dL — ABNORMAL LOW (ref 8.9–10.3)
Chloride: 112 mmol/L — ABNORMAL HIGH (ref 98–111)
Creatinine, Ser: 0.88 mg/dL (ref 0.44–1.00)
GFR, Estimated: >60 mL/min (ref >60)
Glucose, Bld: 169 mg/dL — ABNORMAL HIGH (ref 70–99)
Potassium: 5.1 mmol/L (ref 3.5–5.1)
Sodium: 142 mmol/L (ref 135–145)

## 2022-10-25 LAB — CBC
HCT: 32.8 % — ABNORMAL LOW (ref 36.0–46.0)
Hemoglobin: 10.9 g/dL — ABNORMAL LOW (ref 12.0–15.0)
MCH: 25.9 pg — ABNORMAL LOW (ref 26.0–34.0)
MCHC: 33.2 g/dL (ref 30.0–36.0)
MCV: 77.9 fL — ABNORMAL LOW (ref 80.0–100.0)
Platelets: 143 10*3/uL — ABNORMAL LOW (ref 150–400)
RBC: 4.21 MIL/uL (ref 3.87–5.11)
RDW: 14.6 % (ref 11.5–15.5)
WBC: 15 10*3/uL — ABNORMAL HIGH (ref 4.0–10.5)
nRBC: 0 % (ref 0.0–0.2)

## 2022-10-25 LAB — RPR: RPR Ser Ql: NONREACTIVE

## 2022-10-25 MED ORDER — CHLORHEXIDINE GLUCONATE CLOTH 2 % EX PADS
6.0000 | MEDICATED_PAD | Freq: Every day | CUTANEOUS | Status: DC
  Administered 2022-10-25 – 2022-10-29 (×4): 6 via TOPICAL

## 2022-10-25 NOTE — Assessment & Plan Note (Signed)
Awake laying in bed, able to say "hello" to me.  Otherwise, unable to assess mental status as she does not answer questions. 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 - Pending EEG  -Pending UDS - Delirium precautions. -Plan to touch base with family today

## 2022-10-25 NOTE — Assessment & Plan Note (Signed)
See plan above for sepsis. -Awaiting urine culture

## 2022-10-25 NOTE — Care Management Obs Status (Signed)
MEDICARE OBSERVATION STATUS NOTIFICATION   Patient Details  Name: Emma Maldonado MRN: [id number] Date of Birth: July 01, 1950   Medicare Observation Status Notification Given:  Yes    Ronny Bacon, RN 10/25/2022, 10:40 AM

## 2022-10-25 NOTE — Assessment & Plan Note (Signed)
Stable. Known hx osteoporosis. -Continue Tylenol every 4 hours as needed -Likely will not require orthopedic intervention - PT/OT - Fall precautions

## 2022-10-25 NOTE — Assessment & Plan Note (Signed)
Urinary retention at baseline, gets I/O cath at ALF thus likely postrenal.  Foley placed. -Continue IV LR at 1.5x maintenance fluids (150 cc/hour) -Monitor urine output -BMP in the morning

## 2022-10-25 NOTE — Assessment & Plan Note (Signed)
Yesterday, met severe sepsis with 3/4 SIRS criteria, urinary source suspected.  Now hemodynamically stable.  Chronically ill but well-appearing -Continue IV ceftriaxone every 24 hours for 7 days total antibiotic course (start 10/12). -Pending urine culture and susceptibilities, will de-escalate antibiotics as able -Maintenance IV fluid running LR at 150 mL/hour.  If normotensive and p.o. intake improves, can stop IV fluids. -Pending blood cultures, will follow

## 2022-10-25 NOTE — Progress Notes (Addendum)
Daily Progress Note Intern Pager: [phone]  Patient name: Emma Maldonado Medical record number: [id number] Date of birth: Dec 09, 1950 Age: 72 y.o. Gender: female  Primary Care Provider: Pcp, No Consultants: None Code Status: DNR/DNI  Pt Overview and Major Events to Date:  10/12: Admitted at 1 PM to [facility]   Assessment and Plan: Emma Maldonado is a 72 year old female here with altered mental status. Pertinent PMH/PSH includes dementia, T2DM, hypertension, s/p renal transplant. She resides at an ALF. Concern for mentation change after receiving her COVID-19 vaccination. Etiology favored to be longstanding Alzheimer's with concern for possible urosepsis meeting SIRS criteria on admission (afebrile, tachypneic, leukocytosis) with nitrites and leukocytes on urinalysis.  Also, TSH is elevated 5.561 although this is likely not the main cause of her alteration in mental status.  Reversible labs are overall unremarkable-RPR, B12, HIV, COVID are all negative or nonreactive.  No signs of intracranial abnormality as CT head was negative.   Assessment & Plan Sepsis (HCC) Yesterday, met severe sepsis with 3/4 SIRS criteria, urinary source suspected.  Now hemodynamically stable.  Chronically ill but well-appearing -Continue IV ceftriaxone every 24 hours for 7 days total antibiotic course (start 10/12). -Pending urine culture and susceptibilities, will de-escalate antibiotics as able -Maintenance IV fluid running LR at 150 mL/hour.  If normotensive and p.o. intake improves, can stop IV fluids. -Pending blood cultures, will follow Altered mental status Awake laying in bed, able to say "hello" to me.  Otherwise, unable to assess mental status as she does not answer questions. 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 - Pending EEG  -Pending UDS - Delirium precautions. -Plan to touch base with family today UTI (urinary tract infection) See plan above  for sepsis. -Awaiting urine culture AKI (acute kidney injury) (HCC) Urinary retention at baseline, gets I/O cath at ALF thus likely postrenal.  Foley placed. -Continue IV LR at 1.5x maintenance fluids (150 cc/hour) -Monitor urine output -BMP in the morning Fracture of L2 vertebra (HCC) Stable. Known hx osteoporosis. -Continue Tylenol every 4 hours as needed -Likely will not require orthopedic intervention - PT/OT - Fall precautions   FEN/GI: Dysphagia 3 PPx: Lovenox 30u subcutaneous q24h Dispo: Likely return to ALF once clinically improved/stable    Subjective:  Patient says hello to me but otherwise does not communicate.  She was resting comfortably in the bed.  Did not show any grimacing/moaning/signs of pain with palpation of abdomen, legs.  Objective: Temp:  [98.2 F (36.8 C)-103.3 F (39.6 C)] 99.6 F (37.6 C) (10/13 0755) Pulse Rate:  [78-110] 96 (10/13 0755) Resp:  [12-27] 17 (10/13 0755) BP: (83-175)/(37-82) 175/82 (10/13 0755) SpO2:  [93 %-100 %] 95 % (10/13 0755) Physical Exam: General: Elderly female, chronically ill-appearing, laying in the bed in no distress Cardiovascular: Regular rate and rhythm Respiratory: Normal work of breathing.  No wheezing or crackles anteriorly. Abdomen: Soft, nontender and nondistended Extremities: Warm and well-perfused Neuro: Difficult to assess.  Awake Psych: Appears in no distress.  Pleasant.  Smiles at me when I enter the room.  No agitation.   Laboratory: Most recent CBC Lab Results  Component Value Date   WBC 18.5 (H) 10/24/2022   HGB 11.6 (L) 10/24/2022   HCT 34.0 (L) 10/24/2022   MCV 80.7 10/24/2022   PLT 180 10/24/2022   Most recent BMP    Latest Ref Rng & Units 10/24/2022    7:59 AM  BMP  Sodium 135 - 145 mmol/L 143  Potassium 3.5 - 5.1 mmol/L 3.1    Darral Dash, DO 10/25/2022, 9:26 AM  PGY-3, [facility] FPTS Intern pager: [phone], text pages welcome Secure chat group [facility] Teaching Service

## 2022-10-26 ENCOUNTER — Inpatient Hospital Stay (HOSPITAL_COMMUNITY): Payer: Medicare Other

## 2022-10-26 DIAGNOSIS — R4182 Altered mental status, unspecified: Secondary | ICD-10-CM | POA: Diagnosis not present

## 2022-10-26 DIAGNOSIS — R569 Unspecified convulsions: Secondary | ICD-10-CM

## 2022-10-26 DIAGNOSIS — N3000 Acute cystitis without hematuria: Secondary | ICD-10-CM | POA: Diagnosis not present

## 2022-10-26 LAB — BASIC METABOLIC PANEL
Anion gap: 8 (ref 5–15)
BUN: 15 mg/dL (ref 8–23)
CO2: 21 mmol/L — ABNORMAL LOW (ref 22–32)
Calcium: 8.5 mg/dL — ABNORMAL LOW (ref 8.9–10.3)
Chloride: 111 mmol/L (ref 98–111)
Creatinine, Ser: 0.82 mg/dL (ref 0.44–1.00)
GFR, Estimated: >60 mL/min (ref >60)
Glucose, Bld: 200 mg/dL — ABNORMAL HIGH (ref 70–99)
Potassium: 3.8 mmol/L (ref 3.5–5.1)
Sodium: 140 mmol/L (ref 135–145)

## 2022-10-26 LAB — GLUCOSE, CAPILLARY
Glucose-Capillary: 181 mg/dL — ABNORMAL HIGH (ref 70–99)
Glucose-Capillary: 153 mg/dL — ABNORMAL HIGH (ref 70–99)
Glucose-Capillary: 151 mg/dL — ABNORMAL HIGH (ref 70–99)
Glucose-Capillary: 99 mg/dL (ref 70–99)

## 2022-10-26 MED ORDER — CEPHALEXIN 500 MG PO CAPS
500.0000 mg | ORAL_CAPSULE | Freq: Two times a day (BID) | ORAL | Status: DC
  Administered 2022-10-27 – 2022-10-29 (×5): 500 mg via ORAL
  Filled 2022-10-26 (×5): qty 1

## 2022-10-26 MED ORDER — INSULIN GLARGINE-YFGN 100 UNIT/ML ~~LOC~~ SOLN
5.0000 [IU] | Freq: Every day | SUBCUTANEOUS | Status: DC
  Administered 2022-10-26 – 2022-10-29 (×4): 5 [IU] via SUBCUTANEOUS
  Filled 2022-10-26 (×4): qty 0.05

## 2022-10-26 MED ORDER — LOSARTAN POTASSIUM 25 MG PO TABS
25.0000 mg | ORAL_TABLET | Freq: Every day | ORAL | Status: DC
  Administered 2022-10-26 – 2022-10-27 (×2): 25 mg via ORAL
  Filled 2022-10-26 (×2): qty 1

## 2022-10-26 NOTE — Assessment & Plan Note (Addendum)
Awake laying in bed, more conversant compared to prior exam with one word responses to some questions. No seizures on EEG, UDS never collected. 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 -- Delirium precautions. -- home namenda 10mg  BID -- Plan to touch base with family for more info on MS baseline

## 2022-10-26 NOTE — Progress Notes (Signed)
Removed patient's foley catheter removed at 1730. Patient due to void by 2330.

## 2022-10-26 NOTE — Assessment & Plan Note (Addendum)
Takes losartan 50mg  at home, this was held on admission due to soft pressures and AKI. AKI now resolved and systolic pressures in the 170s-180s. -start losartan 25mg  daily -continue to monitor vitals

## 2022-10-26 NOTE — Assessment & Plan Note (Addendum)
Scr has normalized to 0.82 from 2.2-2.3 on admission. Urinary retention at baseline, gets I/O cath at ALF thus likely postrenal AKI. UOP 1.25L yesterday with foley. -- d/c foley -- bladder scan q4hr and I/O cath as needed for PVR>322mL on 2 scans -- continue home bethanechol 25mg  BID -- AM BMP

## 2022-10-26 NOTE — Inpatient Diabetes Management (Signed)
Inpatient Diabetes Program Recommendations  AACE/ADA: New Consensus Statement on Inpatient Glycemic Control (2015)  Target Ranges:  Prepandial:   less than 140 mg/dL      Peak postprandial:   less than 180 mg/dL (1-2 hours)      Critically ill patients:  140 - 180 mg/dL   Lab Results  Component Value Date   GLUCAP 181 (H) 10/26/2022   HGBA1C 7.0 (H) 10/24/2022    Review of Glycemic Control  Latest Reference Range & Units 10/25/22 17:02 10/25/22 22:38 10/26/22 08:11  Glucose-Capillary 70 - 99 mg/dL 578 (H) 469 (H) 629 (H)  (H): Data is abnormally high Diabetes history: Type 2 DM Outpatient Diabetes medications: Humalog 0-10 units TID, Tresiba 60 units QD Current orders for Inpatient glycemic control: Novolog 0-9 units TID Prednisone 5 mg QD  Inpatient Diabetes Program Recommendations:    Consider adding Semglee 6 units every day.  Thanks, Lujean Rave, MSN, RNC-OB Diabetes Coordinator [phone] (8a-5p)

## 2022-10-26 NOTE — Progress Notes (Signed)
EEG complete - results pending 

## 2022-10-26 NOTE — Plan of Care (Signed)
Problem: Education: Goal: Knowledge of General Education information will improve Description Including pain rating scale, medication(s)/side effects and non-pharmacologic comfort measures Outcome: Progressing   Problem: Clinical Measurements: Goal: Will remain free from infection Outcome: Progressing Goal: Cardiovascular complication will be avoided Outcome: Progressing   Problem: Activity: Goal: Risk for activity intolerance will decrease Outcome: Progressing

## 2022-10-26 NOTE — Assessment & Plan Note (Addendum)
A1c 7.0. Home regimen: 60 units long acting daily and 1-10 units short acting with meals. BG around 150-200 and got 5 units long acting insulin and 4 units short acting over the past 24 hours. On chronic steroids due to hx of renal transplant.  -Semglee 5u daily -sensitive SSI -CBG checks with meals and at bedtime

## 2022-10-26 NOTE — Progress Notes (Signed)
Called patient's daughter, Gardiner Ramus, to give updates and ask about patient's baseline mental status.  Gardiner Ramus states that her mom usually just responds to yes or no questions.  Told Gardiner Ramus that it seems her mom may be back to her baseline today based on my exam and that our plan was to start oral antibiotics today and adjust her diabetes and blood pressure medications as indicated.  Gardiner Ramus appreciated the update and had no further questions.

## 2022-10-26 NOTE — Procedures (Signed)
Patient Name: Emma Maldonado  MRN: [id number]  Epilepsy Attending: Charlsie Quest  Referring Physician/Provider: Lorayne Bender, MD  Date: 10/26/2022 Duration: 26.22 mins  Patient history: 72yo F with ams getting eeg to evaluate for seizure  Level of alertness: Awake, asleep  AEDs during EEG study: None  Technical aspects: This EEG study was done with scalp electrodes positioned according to the 10-20 International system of electrode placement. Electrical activity was reviewed with band pass filter of 1-70Hz , sensitivity of 7 uV/mm, display speed of 17mm/sec with a 60Hz  notched filter applied as appropriate. EEG data were recorded continuously and digitally stored.  Video monitoring was available and reviewed as appropriate.  Description: The posterior dominant rhythm consists of 9-10 Hz activity of moderate voltage (25-35 uV) seen predominantly in posterior head regions, symmetric and reactive to eye opening and eye closing. Sleep was characterized by vertex waves, sleep spindles (12 to 14 Hz), maximal frontocentral region. EEG showed intermittent generalized 3 to 6 Hz theta-delta slowing. Physiologic photic driving was not seen during photic stimulation. Hyperventilation was not performed.     ABNORMALITY - Intermittent slow, generalized  IMPRESSION: This study is suggestive of mild diffuse encephalopathy. No seizures or epileptiform discharges were seen throughout the recording.  Emma Maldonado

## 2022-10-26 NOTE — Progress Notes (Signed)
Went to check pt needs, noticed name and middle name was in a wrong order on her ID band, informed Diplomatic Services operational officer and gave the right information, by Orlan Leavens Spanish Medical Interpreter.

## 2022-10-26 NOTE — Progress Notes (Signed)
Daily Progress Note Intern Pager: [phone]  Patient name: Emma Maldonado Medical record number: [id number] Date of birth: [birth year] Age: 72 y.o. Gender: female  Primary Care Provider: Pcp, No Consultants: none Code Status: DNR/DNI  Pt Overview and Major Events to Date:  10/12: admitted to FMTS  Assessment and Plan: Emma Maldonado is a 72 year old female here with altered mental status most likely secondary to UTI with subsequent AKI. Pt has dementia and is minimally communicative at baseline and resides at an ALF. More alert and interactive today. Blood cultures negative x1 day and urine cultures unfortunately not collected prior to initiating ABX. Will transition to PO ABX based on sensitivity data from prior urine culture in Feb 2024.  Pertinent PMH/PSH includes dementia, T2DM, hypertension, s/p renal transplant. Assessment & Plan Sepsis (HCC) Severe sepsis on admission with 3/4 SIRS criteria, urinary source suspected. IVF d/c'd on 10/13 as pt hemodynamically stable.  Chronically ill but well-appearing and improving overall picture. -- Start PO cephalexin 500mg  q12h x 5 days -- Blood cultures with no growth x 1 day, will follow Altered mental status Awake laying in bed, more conversant compared to prior exam with one word responses to some questions. No seizures on EEG, UDS never collected. 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 -- Delirium precautions. -- home namenda 10mg  BID -- Plan to touch base with family for more info on MS baseline AKI (acute kidney injury) (HCC) Scr has normalized to 0.82 from 2.2-2.3 on admission. Urinary retention at baseline, gets I/O cath at ALF thus likely postrenal AKI. UOP 1.25L yesterday with foley. -- d/c foley -- bladder scan q4hr and I/O cath as needed for PVR>332mL on 2 scans -- continue home bethanechol 25mg  BID -- AM BMP Fracture of L2 vertebra (HCC) Stable. Known hx osteoporosis. --  Continue Tylenol every 4 hours as needed -- Likely will not require orthopedic intervention -- PT/OT -- Fall precautions  Type 2 diabetes mellitus with hyperglycemia (HCC) A1c 7.0. Home regimen: 60 units long acting daily and 1-10 units short acting with meals. BG around 150-200 and got 5 units long acting insulin and 4 units short acting over the past 24 hours. On chronic steroids due to hx of renal transplant.  -Semglee 5u daily -sensitive SSI -CBG checks with meals and at bedtime Hypertension Takes losartan 50mg  at home, this was held on admission due to soft pressures and AKI. AKI now resolved and systolic pressures in the 170s-180s. -start losartan 25mg  daily -continue to monitor vitals   Chronic and Stable Issues: Right kidney transplant: Continue tacrolimus 0.5 daily, PO prednisone 5 mg Hyperlipidemia: crestor 5mg  daily  FEN/GI: DSY 3 PPx: lovenox Dispo: return to ALF once clinically stable  Subjective:  Patient resting in bed with eyes closed while getting EEG leads placed this morning.  Opens eyes to voice and gentle touch, responds appropriately when asked her name and where she is.  Does not respond when asked the date. Points to lower abdomen when asked about pain. Does not respond when asked if she has questions or needs anything else.   Objective: Temp:  [98.7 F (37.1 C)-99.1 F (37.3 C)] 98.7 F (37.1 C) (10/14 0535) Pulse Rate:  [83-87] 83 (10/14 0535) Resp:  [18-20] 20 (10/14 0535) BP: (177-181)/(58-102) 181/58 (10/14 0535) SpO2:  [95 %-98 %] 98 % (10/14 0535) Physical Exam: General: older female sitting up in bed in NAD Cardiovascular: RRR, normal S1/S2 Respiratory: CTAB at anterior lung fields, normal  WOB on RA Abdomen: normoactive bowel sounds, soft, nontender to palpation, nondistended Extremities: no edema to BLE Neuro: awake and alert during majority of interaction, closes eyes and does not respond to some questions, oriented to self and location but  not date   Laboratory: Most recent CBC Lab Results  Component Value Date   WBC 15.0 (H) 10/25/2022   HGB 10.9 (L) 10/25/2022   HCT 32.8 (L) 10/25/2022   MCV 77.9 (L) 10/25/2022   PLT 143 (L) 10/25/2022   Most recent BMP    Latest Ref Rng & Units 10/26/2022    3:28 AM  BMP  Glucose 70 - 99 mg/dL 132   BUN 8 - 23 mg/dL 15   Creatinine 4.40 - 1.00 mg/dL 1.02   Sodium 725 - 366 mmol/L 140   Potassium 3.5 - 5.1 mmol/L 3.8   Chloride 98 - 111 mmol/L 111   CO2 22 - 32 mmol/L 21   Calcium 8.9 - 10.3 mg/dL 8.5      Imaging/Diagnostic Tests: None  Lorayne Bender, MD 10/26/2022, 7:57 AM  PGY-1, [facility] FPTS Intern pager: [phone], text pages welcome Secure chat group [facility] Teaching Service

## 2022-10-26 NOTE — Assessment & Plan Note (Addendum)
Stable. Known hx osteoporosis. -Continue Tylenol every 4 hours as needed -Likely will not require orthopedic intervention - PT/OT - Fall precautions

## 2022-10-26 NOTE — Plan of Care (Signed)

## 2022-10-26 NOTE — Assessment & Plan Note (Addendum)
Severe sepsis on admission with 3/4 SIRS criteria, urinary source suspected. IVF d/c'd on 10/13 as pt hemodynamically stable.  Chronically ill but well-appearing and improving overall picture. -- Start PO cephalexin 500mg  q12h x 5 days -- Blood cultures with no growth x 1 day, will follow

## 2022-10-27 ENCOUNTER — Encounter (HOSPITAL_COMMUNITY): Payer: Self-pay

## 2022-10-27 ENCOUNTER — Other Ambulatory Visit: Payer: Self-pay

## 2022-10-27 DIAGNOSIS — N3000 Acute cystitis without hematuria: Secondary | ICD-10-CM | POA: Diagnosis not present

## 2022-10-27 DIAGNOSIS — R339 Retention of urine, unspecified: Secondary | ICD-10-CM | POA: Insufficient documentation

## 2022-10-27 DIAGNOSIS — R4182 Altered mental status, unspecified: Secondary | ICD-10-CM | POA: Diagnosis not present

## 2022-10-27 LAB — GLUCOSE, CAPILLARY
Glucose-Capillary: 96 mg/dL (ref 70–99)
Glucose-Capillary: 154 mg/dL — ABNORMAL HIGH (ref 70–99)
Glucose-Capillary: 115 mg/dL — ABNORMAL HIGH (ref 70–99)
Glucose-Capillary: 123 mg/dL — ABNORMAL HIGH (ref 70–99)
Glucose-Capillary: 117 mg/dL — ABNORMAL HIGH (ref 70–99)

## 2022-10-27 LAB — BASIC METABOLIC PANEL
Anion gap: 9 (ref 5–15)
BUN: 12 mg/dL (ref 8–23)
CO2: 22 mmol/L (ref 22–32)
Calcium: 8.4 mg/dL — ABNORMAL LOW (ref 8.9–10.3)
Chloride: 110 mmol/L (ref 98–111)
Creatinine, Ser: 0.75 mg/dL (ref 0.44–1.00)
GFR, Estimated: >60 mL/min (ref >60)
Glucose, Bld: 104 mg/dL — ABNORMAL HIGH (ref 70–99)
Potassium: 3.2 mmol/L — ABNORMAL LOW (ref 3.5–5.1)
Sodium: 141 mmol/L (ref 135–145)

## 2022-10-27 MED ORDER — LOSARTAN POTASSIUM 50 MG PO TABS
50.0000 mg | ORAL_TABLET | Freq: Every day | ORAL | Status: DC
  Administered 2022-10-28: 50 mg via ORAL
  Filled 2022-10-27: qty 1

## 2022-10-27 MED ORDER — POTASSIUM CHLORIDE CRYS ER 20 MEQ PO TBCR
40.0000 meq | EXTENDED_RELEASE_TABLET | Freq: Once | ORAL | Status: AC
  Administered 2022-10-27: 40 meq via ORAL
  Filled 2022-10-27: qty 2

## 2022-10-27 MED ORDER — LOSARTAN POTASSIUM 25 MG PO TABS: 25.0000 mg | ORAL_TABLET | Freq: Once | ORAL | Status: DC

## 2022-10-27 NOTE — Progress Notes (Signed)
Daily Progress Note Intern Pager: [phone]  Patient name: Emma Maldonado Medical record number: [id number] Date of birth: 10/26/1950 Age: 72 y.o. Gender: female  Primary Care Provider: Pcp, No Consultants: none Code Status: full  Pt Overview and Major Events to Date:  10/12: admitted to FMTS  Assessment and Plan: Emma Maldonado is a 73 year old female here with altered mental status most likely secondary to UTI with subsequent AKI. Pt has dementia and is minimally communicative at baseline and resides at an ALF. Blood cultures negative and urine cultures unfortunately not collected prior to initiating ABX. Elevated temperatures overnight, will send repeat urine culture today. Overall, pt much improved and back to mental status baseline per family. Likely d/c back to ALF tomorrow.   Pertinent PMH/PSH includes dementia, T2DM, hypertension, s/p renal transplant. Assessment & Plan Sepsis (HCC) Severe sepsis on admission with 3/4 SIRS criteria, most likely due to UTI. IVF d/c'd on 10/13 as pt hemodynamically stable. Chronically ill but well-appearing and improving overall picture. Elevated temperature overnight, highest recorded 100.2. Transitioned from IV CTX to PO keflex on 10/14 based on susceptibility date from prior UTI as urine culture not completed this admission. -- recheck Urine cx today -- continue cephalexin 500mg  q12h x 5 days total (started 10/14) -- Blood cultures with no growth x 2 days, will follow  Altered mental status Awake laying in bed, more conversant compared to prior exam with appropriate responses to most questions, family states this is her baseline. No seizures on EEG, UDS never collected. 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 -- Delirium precautions. -- home namenda 10mg  BID Urinary retention Chronic issue, gets I/O cath at ALF. This likely caused of AKI that has now resolved. Foley placed 10/13 due to  retention, removed 10/14. Documented UOP overnight, bladder scan with 11mL this morning. -- bladder scan q4hr and I/O cath as needed for PVR>320mL on 2 scans -- continue home bethanechol 25mg  BID -- AM BMP Type 2 diabetes mellitus with hyperglycemia (HCC) A1c 7.0. Home regimen: 60 units long acting daily and 1-10 units short acting with meals. Fasting BG 80s-90s, post prandial around 150-200. Got 5 units long acting insulin and 6 units short acting over the past 24 hours. On chronic steroids due to hx of renal transplant.  -Semglee 5u daily -sensitive SSI -CBG checks with meals and at bedtime Hypertension Takes losartan 50mg  at home, this was held on admission due to soft pressures and AKI. AKI now resolved and systolic pressures in the 170s-180s. Started 25mg  losartan 10/14. -increase back to home losartan 50mg  daily -continue to monitor vitals Fracture of L2 vertebra (HCC) Stable. Known hx osteoporosis. PT/OT to see today. -- Continue Tylenol every 4 hours as needed -- Likely will not require orthopedic intervention -- PT/OT eval and treat -- Fall precautions  AKI (acute kidney injury) (HCC) (Resolved: 10/27/2022) Scr has returned to baseline (0.7-0.8) from 2.2-2.3 on admission. Urinary retention at baseline, gets I/O cath at ALF thus likely postrenal AKI. Foley removed 10/14.   Chronic and Stable Issues: Right kidney transplant: Continue tacrolimus 0.5 daily, PO prednisone 5 mg Hyperlipidemia: crestor 5mg  daily  FEN/GI: DYS 3 PPx: lovenox Dispo: pending PT recommendations, will likely return to ALF  Subjective:  Patient sitting up in chair eating breakfast this morning.  When asked how she is feeling she states "I feel better today."  Denies abdominal pain.  Denies further questions or needs currently.  Objective: Temp:  [98.3 F (36.8  C)-100 F (37.8 C)] 100 F (37.8 C) (10/15 0539) Pulse Rate:  [73-84] 84 (10/15 0539) Resp:  [15-18] 18 (10/15 0539) BP:  (167-185)/(59-71) 167/59 (10/15 0539) SpO2:  [94 %-97 %] 95 % (10/15 0539) Weight:  [60.3 kg] 60.3 kg (10/14 1100) Physical Exam: General: Older female sitting in chair eating breakfast, pleasant, in no acute distress Cardiovascular: RRR, normal S1/S2 Respiratory: CTAB at anterior lung fields, normal work of breathing on room air Abdomen: Normoactive bowel sounds, soft, nontender, nondistended Neuro: Awake and alert, answers questions appropriately with short responses or head nods, moving all 4 extremities spontaneously  Laboratory: Most recent CBC Lab Results  Component Value Date   WBC 15.0 (H) 10/25/2022   HGB 10.9 (L) 10/25/2022   HCT 32.8 (L) 10/25/2022   MCV 77.9 (L) 10/25/2022   PLT 143 (L) 10/25/2022   Most recent BMP    Latest Ref Rng & Units 10/26/2022    3:28 AM  BMP  Glucose 70 - 99 mg/dL 657   BUN 8 - 23 mg/dL 15   Creatinine 8.46 - 1.00 mg/dL 9.62   Sodium 952 - 841 mmol/L 140   Potassium 3.5 - 5.1 mmol/L 3.8   Chloride 98 - 111 mmol/L 111   CO2 22 - 32 mmol/L 21   Calcium 8.9 - 10.3 mg/dL 8.5      Imaging/Diagnostic Tests: None  Lorayne Bender, MD 10/27/2022, 7:28 AM  PGY-1, [facility] FPTS Intern pager: [phone], text pages welcome Secure chat group [facility] Teaching Service

## 2022-10-27 NOTE — Assessment & Plan Note (Addendum)
Chronic issue, gets I/O cath at ALF. This likely caused of AKI that has now resolved. Foley placed 10/13 due to retention, removed 10/14. Documented UOP overnight, bladder scan with 11mL this morning. -- bladder scan q4hr and I/O cath as needed for PVR>327mL on 2 scans -- continue home bethanechol 25mg  BID -- AM BMP

## 2022-10-27 NOTE — Progress Notes (Signed)
Speech Language Pathology Treatment: Dysphagia  Patient Details Name: Emma Maldonado MRN: [id number] DOB: [birth year] Today's Date: 10/27/2022 Time: 0852-0902 SLP Time Calculation (min) (ACUTE ONLY): 10 min  Assessment / Plan / Recommendation Clinical Impression  Patient seen for diet tolerance and potential to advance following initial evaluation 10/12. Patient sitting in chair, breakfast tray in front of her, however not consuming meal, dosing off. She aroused easily and SLP repositioned fully upright to maximize ability to self feed, alertness, and overall safety with po intake. She was able to independently consume thin liquids via cup sips without overt indication of aspiration. When provided with regular texture cracker she dipped it into her coffee independently to moistened. Nodded her head yes when asked if she preferred softer texture solids. With true regular texture meat, mastication time continues to be prolonged as it was during initial evaluation with patient independently consuming liquids during mastication, suspect to aid in clearance. At this time, recommend continuation of soft solids. As patient returns to baseline mentally, screen at SNF may be beneficial to determine potential to advance, however in the acute care setting, current diet remains appropriate. No further SLP needs indication.    HPI HPI: 72 yr old from [facility] d/t AMS/unresponsive. Pt was found by staff laying in bed unconscious only responsive to painful stimuli. Pt's last seen normal was yesterday breakfast time. CXR Low lung volumes. No acute cardiopulmonary abnormality. Pt has dementia, DM, renal insufficiency, HTN.      SLP Plan  All goals met      Recommendations for follow up therapy are one component of a multi-disciplinary discharge planning process, led by the attending physician.  Recommendations may be updated based on patient status, additional functional criteria and insurance  authorization.    Recommendations  Diet recommendations: Dysphagia 3 (mechanical soft);Thin liquid Liquids provided via: Cup;Straw Medication Administration: Crushed with puree (can advance to whole in liquid as mentation returns to baseline) Supervision: Patient able to self feed Compensations: Slow rate;Small sips/bites Postural Changes and/or Swallow Maneuvers: Seated upright 90 degrees                 Oral care BID     Dysphagia, unspecified (R13.10)     All goals met    Ferdinand Lango MA, CCC-SLP  Emma Maldonado  10/27/2022, 9:29 AM

## 2022-10-27 NOTE — Assessment & Plan Note (Signed)
A1c 7.0. Home regimen: 60 units long acting daily and 1-10 units short acting with meals. Fasting BG 80s-90s, post prandial around 150-200. Got 5 units long acting insulin and 6 units short acting over the past 24 hours. On chronic steroids due to hx of renal transplant.  -Semglee 5u daily -sensitive SSI -CBG checks with meals and at bedtime

## 2022-10-27 NOTE — [place] FL2 (Signed)
[place] MEDICAID FL2 LEVEL OF CARE FORM     IDENTIFICATION  Patient Name: Emma Maldonado Birthdate: 1950/02/05 Sex: female Admission Date (Current Location): 10/24/2022  [facility] and [place] Number:  Producer, television/film/video and Address:  [facility], [address] [place], [place] [address]      Provider Number: [id number]  Attending Physician Name and Address:  Doreene Eland, MD  Relative Name and Phone Number:       Current Level of Care: Hospital Recommended Level of Care: Skilled Nursing Facility Prior Approval Number:    Date Approved/Denied:   PASRR Number: [id number]  Discharge Plan: SNF    Current Diagnoses: Patient Active Problem List   Diagnosis Date Noted   Urinary retention 10/27/2022   AMS (altered mental status) 10/24/2022   Closed fracture of second lumbar vertebra (HCC) 10/24/2022   Fracture of L2 vertebra (HCC) 10/24/2022   Sepsis due to urinary tract infection (HCC) 02/23/2022   Acute lower UTI    Chronic diarrhea 01/05/2021   Pressure injury of skin 01/03/2021   Sepsis (HCC) 01/03/2021   Acute urinary retention 01/03/2021   Bacteremia    Severe sepsis (HCC) 12/23/2020   Hypothyroidism 12/23/2020   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   Hypomagnesemia    Hypotension 08/22/2020   Hydronephrosis of right kidney 05/01/2020   Rib fractures 05/01/2020   UTI (urinary tract infection) 03/25/2020   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   Hypertension 03/25/2020   Dementia without behavioral disturbance (HCC) 03/25/2020   Altered mental status 03/25/2020   Type 2 diabetes mellitus with hyperglycemia (HCC) 03/25/2020   Accelerated hypertension 03/28/2012   Viral gastroenteritis 03/28/2012   History of renal transplant 03/26/2012   Diabetes mellitus (HCC) 03/26/2012   Hyperlipidemia 03/26/2012    Orientation RESPIRATION BLADDER Height & Weight     Self  Normal Incontinent Weight: 132 lb 15 oz (60.3 kg)  (05/15/22 OP visit) Height:  5\' 1"  (154.9 cm)  BEHAVIORAL SYMPTOMS/MOOD NEUROLOGICAL BOWEL NUTRITION STATUS      Incontinent Diet (See DC summary)  AMBULATORY STATUS COMMUNICATION OF NEEDS Skin   Extensive Assist Verbally Skin abrasions (Bilateral Leg Abrasions)                       Personal Care Assistance Level of Assistance  Dressing, Feeding, Bathing, Total care Bathing Assistance: Maximum assistance Feeding assistance: Limited assistance Dressing Assistance: Maximum assistance Total Care Assistance: Maximum assistance   Functional Limitations Info  Sight, Hearing, Speech Sight Info: Adequate Hearing Info: Adequate Speech Info: Adequate    SPECIAL CARE FACTORS FREQUENCY  PT (By licensed PT), OT (By licensed OT)     PT Frequency: 5x/week OT Frequency: 5x/week            Contractures Contractures Info: Not present    Additional Factors Info  Code Status, Insulin Sliding Scale Code Status Info: DNR Allergies Info: NKA   Insulin Sliding Scale Info: See DC summary       Current Medications (10/27/2022):  This is the current hospital active medication list Current Facility-Administered Medications  Medication Dose Route Frequency Provider Last Rate Last Admin   bethanechol (URECHOLINE) tablet 25 mg  25 mg Oral TID Evette Georges, MD   25 mg at 10/27/22 0944   cephALEXin (KEFLEX) capsule 500 mg  500 mg Oral Q12H Dameron, Marisa, DO   500 mg at 10/27/22 0943   Chlorhexidine Gluconate Cloth 2 % PADS 6 each  6 each Topical Daily Doreene Eland, MD   6 each at 10/26/22 0844   cholestyramine (QUESTRAN) packet 4 g  4 g Oral BID Evette Georges, MD   4 g at 10/27/22 1255   enoxaparin (LOVENOX) injection 30 mg  30 mg Subcutaneous Q24H Evette Georges, MD   30 mg at 10/26/22 1620   fludrocortisone (FLORINEF) tablet 0.2 mg  0.2 mg Oral QHS Evette Georges, MD   0.2 mg at 10/26/22 2149   insulin aspart (novoLOG) injection 0-9 Units  0-9 Units Subcutaneous TID WC Evette Georges, MD   2  Units at 10/27/22 1254   insulin glargine-yfgn [facility]) injection 5 Units  5 Units Subcutaneous Daily Dameron, Nolberto Hanlon, DO   5 Units at 10/27/22 0945   levothyroxine (SYNTHROID) tablet 125 mcg  125 mcg Oral QAC breakfast Evette Georges, MD   125 mcg at 10/27/22 4098   loperamide HCl (IMODIUM) 1 MG/7.5ML suspension 2 mg  2 mg Oral Q4H PRN Evette Georges, MD       Melene Muller ON 10/28/2022] losartan (COZAAR) tablet 50 mg  50 mg Oral Daily Mabe, Earvin Hansen, MD       memantine [facility]) tablet 10 mg  10 mg Oral Q12H Evette Georges, MD   10 mg at 10/27/22 1191   predniSONE (DELTASONE) tablet 5 mg  5 mg Oral q morning Evette Georges, MD   5 mg at 10/27/22 0943   rosuvastatin (CRESTOR) tablet 5 mg  5 mg Oral QHS Evette Georges, MD   5 mg at 10/26/22 2149   tacrolimus (PROGRAF) capsule 0.5 mg  0.5 mg Oral Daily Evette Georges, MD   0.5 mg at 10/27/22 4782     Discharge Medications: Please see discharge summary for a list of discharge medications.  Relevant Imaging Results:  Relevant Lab Results:   Additional Information SSN: [id number]  Michaela Corner, [place]

## 2022-10-27 NOTE — Assessment & Plan Note (Addendum)
Takes losartan 50mg  at home, this was held on admission due to soft pressures and AKI. AKI now resolved and systolic pressures in the 170s-180s. Started 25mg  losartan 10/14. -increase back to home losartan 50mg  daily -continue to monitor vitals

## 2022-10-27 NOTE — Assessment & Plan Note (Signed)
Stable. Known hx osteoporosis. PT/OT to see today. -- Continue Tylenol every 4 hours as needed -- Likely will not require orthopedic intervention -- PT/OT eval and treat -- Fall precautions

## 2022-10-27 NOTE — Evaluation (Signed)
Physical Therapy Evaluation Patient Details Name: Emma Maldonado MRN: [id number] DOB: Aug 29, 1950 Today's Date: 10/27/2022  History of Present Illness  72 y.o. female presents to [facility] hospital on 10/24/2022 with AMS and subsequent AKI. PMH includes dementia, DMII, HTN, renal transplant.  Clinical Impression  Pt presents to PT with presumed deficits in functional mobility, gait, balance, power, strength, endurance, however pt is unable to provide an accurate history due to cognitive and communication deficits. Pt requires physical assistance to stand and to safely perform pivot transfers at this time. Pt's chart indicates she is a resident at [facility] nursing facility, and prior admissions indicate assistance requirements for transfers as well. Pt may not be far from baseline based on this information. PT recommends a return to SNF when medically appropriate. PT will continue to follow until discharge is complete.      If plan is discharge home, recommend the following: A lot of help with walking and/or transfers;A lot of help with bathing/dressing/bathroom;Assistance with cooking/housework;Assist for transportation;Help with stairs or ramp for entrance   Can travel by private vehicle   No    Equipment Recommendations  (defer to post-acute setting)  Recommendations for Other Services       Functional Status Assessment Patient has had a recent decline in their functional status and demonstrates the ability to make significant improvements in function in a reasonable and predictable amount of time.     Precautions / Restrictions Precautions Precautions: Fall Precaution Comments: dementia Restrictions Weight Bearing Restrictions: No      Mobility  Bed Mobility Overal bed mobility: Needs Assistance Bed Mobility: Sit to Supine       Sit to supine: Min assist, HOB elevated        Transfers Overall transfer level: Needs assistance Equipment used: 1 person hand held  assist Transfers: Sit to/from Stand, Bed to chair/wheelchair/BSC Sit to Stand: Mod assist   Step pivot transfers: Mod assist       General transfer comment: assist to power up into standing, often with flexed trunk. Pt requires physical assist at hips to complete step-pivot transfer to ensure she reaches desired destination    Ambulation/Gait                  Stairs            Wheelchair Mobility     Tilt Bed    Modified Rankin (Stroke Patients Only)       Balance Overall balance assessment: Needs assistance Sitting-balance support: Single extremity supported, Feet supported Sitting balance-Leahy Scale: Poor     Standing balance support: Single extremity supported, Reliant on assistive device for balance Standing balance-Leahy Scale: Poor Standing balance comment: min-modA, trunk flexed                             Pertinent Vitals/Pain Pain Assessment Pain Assessment: PAINAD Breathing: normal Negative Vocalization: none Facial Expression: smiling or inexpressive Body Language: relaxed Consolability: no need to console PAINAD Score: 0    Home Living Family/patient expects to be discharged to:: Skilled nursing facility                   Additional Comments: chart indicates the pt lives at [facility] nursing facility    Prior Function Prior Level of Function : Needs assist               ADLs Comments: pt is unable to provide a history at this  time, does not answer when asked if she walks or utilizes DME. Anticipate assistance requirements for mobility based on prior therapy notes from other admissions     Extremity/Trunk Assessment   Upper Extremity Assessment Upper Extremity Assessment: Defer to OT evaluation    Lower Extremity Assessment Lower Extremity Assessment: Generalized weakness    Cervical / Trunk Assessment Cervical / Trunk Assessment: Kyphotic  Communication   Communication Communication: Difficulty  communicating thoughts/reduced clarity of speech (expressive aphasia) Cueing Techniques: Verbal cues;Gestural cues;Tactile cues  Cognition Arousal: Alert Behavior During Therapy: Flat affect Overall Cognitive Status: History of cognitive impairments - at baseline                                 General Comments: dementia at baseline. Pt is able to report her name and date of birth, otherwise only answers yes/no questions with head nods        General Comments General comments (skin integrity, edema, etc.): VSS on RA    Exercises     Assessment/Plan    PT Assessment Patient needs continued PT services  PT Problem List Decreased strength;Decreased activity tolerance;Decreased mobility;Decreased balance;Decreased knowledge of use of DME;Decreased safety awareness;Decreased knowledge of precautions       PT Treatment Interventions DME instruction;Gait training;Functional mobility training;Therapeutic activities;Therapeutic exercise;Balance training;Neuromuscular re-education;Cognitive remediation;Patient/family education;Wheelchair mobility training    PT Goals (Current goals can be found in the Care Plan section)  Acute Rehab PT Goals Patient Stated Goal: patient is unable to state. PT goal to improve transfer quality and reduce falls risk PT Goal Formulation: Patient unable to participate in goal setting Time For Goal Achievement: 11/10/22 Potential to Achieve Goals: Fair    Frequency Min 1X/week     Co-evaluation               AM-PAC PT "6 Clicks" Mobility  Outcome Measure Help needed turning from your back to your side while in a flat bed without using bedrails?: A Little Help needed moving from lying on your back to sitting on the side of a flat bed without using bedrails?: A Lot Help needed moving to and from a bed to a chair (including a wheelchair)?: A Lot Help needed standing up from a chair using your arms (e.g., wheelchair or bedside chair)?: A  Lot Help needed to walk in hospital room?: Total Help needed climbing 3-5 steps with a railing? : Total 6 Click Score: 11    End of Session Equipment Utilized During Treatment: Gait belt Activity Tolerance: Patient tolerated treatment well Patient left: in bed;with call bell/phone within reach;with bed alarm set Nurse Communication: Mobility status PT Visit Diagnosis: Other abnormalities of gait and mobility (R26.89);Muscle weakness (generalized) (M62.81)    Time: 1610-9604 PT Time Calculation (min) (ACUTE ONLY): 25 min   Charges:   PT Evaluation $PT Eval Low Complexity: 1 Low   PT General Charges $$ ACUTE PT VISIT: 1 Visit         Arlyss Gandy, PT, DPT Acute Rehabilitation Office [phone]   Arlyss Gandy 10/27/2022, 10:06 AM

## 2022-10-27 NOTE — Plan of Care (Signed)
Improvement since admission

## 2022-10-27 NOTE — Assessment & Plan Note (Addendum)
Awake laying in bed, more conversant compared to prior exam with appropriate responses to most questions, family states this is her baseline. No seizures on EEG, UDS never collected. 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 -- Delirium precautions. -- home namenda 10mg  BID

## 2022-10-27 NOTE — Assessment & Plan Note (Addendum)
Scr has returned to baseline (0.7-0.8) from 2.2-2.3 on admission. Urinary retention at baseline, gets I/O cath at ALF thus likely postrenal AKI. Foley removed 10/14.

## 2022-10-27 NOTE — Progress Notes (Signed)
Patient have no IV site reported by on -coming nurse.

## 2022-10-27 NOTE — Progress Notes (Signed)
Attempt to finish admission history, pt has dementia at baseline, unable to answer question.

## 2022-10-27 NOTE — TOC Initial Note (Signed)
Transition of Care [facility]) - Initial/Assessment Note    Patient Details  Name: Emma Maldonado MRN: [id number] Date of Birth: 04-Nov-1950  [facility] CM/SW Contact:    Michaela Corner, LCSWA Phone Number: 10/27/2022, 2:04 PM  Clinical Narrative:    LCSWA reviewed the chart, pt is Emma long term care at [facility]. Confirmed with facility. Pt requiring 3 midnight stay to qualify for additional services. Plan to DC tomorrow if medically cleared. Medical team notified. VM was left for daughter to confirm DC plan. TOC will continue to follow.               Expected Discharge Plan: Skilled Nursing Facility Barriers to Discharge: Insurance Authorization   Patient Goals and CMS Choice Patient states their goals for this hospitalization and ongoing recovery are:: Pt unavailable for goal setting at this time CMS Medicare.gov Compare Post Acute Care list provided to:: Patient Represenative (must comment) (VM left for daughter) Choice offered to / list presented to : Adult Children      Expected Discharge Plan and Services     Post Acute Care Choice: Skilled Nursing Facility Living arrangements for the past 2 months: Skilled Nursing Facility                                      Prior Living Arrangements/Services Living arrangements for the past 2 months: Skilled Nursing Facility Lives with:: Facility Resident Patient language and need for interpreter reviewed:: Yes Do you feel safe going back to the place where you live?: Yes      Need for Family Participation Emma Patient Care: Yes (Comment) Care giver support system Emma place?: Yes (comment)   Criminal Activity/Legal Involvement Pertinent to Current Situation/Hospitalization: No - Comment as needed  Activities of Daily Living   ADL Screening (condition at time of admission) Is the patient deaf or have difficulty hearing?: No Does the patient have difficulty seeing, even when wearing glasses/contacts?:  No Does the patient have difficulty concentrating, remembering, or making decisions?: Yes  Permission Sought/Granted Permission sought to share information with : Family Supports (VM left for daughter)                Emotional Assessment Appearance:: Appears stated age Attitude/Demeanor/Rapport: Unable to Assess Affect (typically observed): Unable to Assess Orientation: : Oriented to Self Alcohol / Substance Use: Not Applicable Psych Involvement: No (comment)  Admission diagnosis:  Metabolic encephalopathy [G93.41] Hypokalemia [E87.6] Acute cystitis without hematuria [N30.00] AKI (acute kidney injury) (HCC) [N17.9] Altered mental status, unspecified altered mental status type [R41.82] AMS (altered mental status) [R41.82] Patient Active Problem List   Diagnosis Date Noted   Urinary retention 10/27/2022   AMS (altered mental status) 10/24/2022   Closed fracture of second lumbar vertebra (HCC) 10/24/2022   Fracture of L2 vertebra (HCC) 10/24/2022   Sepsis due to urinary tract infection (HCC) 02/23/2022   Acute lower UTI    Chronic diarrhea 01/05/2021   Pressure injury of skin 01/03/2021   Sepsis (HCC) 01/03/2021   Acute urinary retention 01/03/2021   Bacteremia    Severe sepsis (HCC) 12/23/2020   Hypothyroidism 12/23/2020   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   Hypomagnesemia    Hypotension 08/22/2020   Hydronephrosis of right kidney 05/01/2020   Rib fractures 05/01/2020   UTI (urinary tract infection) 03/25/2020   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   Hypertension 03/25/2020   Dementia  without behavioral disturbance (HCC) 03/25/2020   Altered mental status 03/25/2020   Type 2 diabetes mellitus with hyperglycemia (HCC) 03/25/2020   Accelerated hypertension 03/28/2012   Viral gastroenteritis 03/28/2012   History of renal transplant 03/26/2012   Diabetes mellitus (HCC) 03/26/2012   Hyperlipidemia 03/26/2012   PCP:  Pcp, No Pharmacy:   [facility] - [place], [place] - [address] [place] [address] Phone: [phone] Fax: [phone]     Social Determinants of Health (SDOH) Social History: SDOH Screenings   Food Insecurity: No Food Insecurity (10/27/2022)  Housing: Patient Unable To Answer (10/27/2022)  Transportation Needs: Patient Unable To Answer (10/24/2022)  Utilities: Patient Unable To Answer (10/24/2022)  Social Connections: Unknown (05/26/2021)   Received from [facility], [facility]  Tobacco Use: Low Risk  (06/23/2022)   Received from [facility]   SDOH Interventions: Food Insecurity Interventions: Intervention Not Indicated Housing Interventions: Intervention Not Indicated (pt is from SNF)   Readmission Risk Interventions    01/03/2021    2:24 PM 01/03/2021    2:16 PM  Readmission Risk Prevention Plan  Transportation Screening  Complete  HRI or Home Care Consult Complete   Social Work Consult for Recovery Care Planning/Counseling Complete   Palliative Care Screening Not Applicable   Medication Review [employer])  Complete

## 2022-10-27 NOTE — Evaluation (Signed)
Occupational Therapy Evaluation Patient Details Name: Emma Maldonado MRN: [id number] DOB: 03/31/1950 Today's Date: 10/27/2022   History of Present Illness 72 Y/O F with PMhx of dementia, DM2, HTN, and renal transplant was brought in from NH due to a change in her mental status following COVID-19 vaccination.  From a facility.   Clinical Impression   Patient admitted from a local SNF for the diagnosis above.  PTA OT will assume assist as needed for ADL and mobility/toileting from facility staff as needed.  Currently she is needing Min A for basic transfers, OT probably provide too much assist for bed mobility, and Min to Mod A for ADL completion.  Patient able to transition to recliner and eating breakfast after setup.  OT can follow in the acute setting to encourage OOB, but recommend transition back to SNF with OT screen per facility protocol.          If plan is discharge home, recommend the following: A lot of help with bathing/dressing/bathroom;A little help with walking and/or transfers;Supervision due to cognitive status    Functional Status Assessment  Patient has had a recent decline in their functional status and demonstrates the ability to make significant improvements in function in a reasonable and predictable amount of time.  Equipment Recommendations  None recommended by OT    Recommendations for Other Services       Precautions / Restrictions Precautions Precautions: Fall Precaution Comments: Does better with Spanish speaking, but understands basic english. Restrictions Weight Bearing Restrictions: No      Mobility Bed Mobility Overal bed mobility: Needs Assistance Bed Mobility: Supine to Sit     Supine to sit: Min assist, Mod assist, HOB elevated     General bed mobility comments: Probably could have done more with increased time and cues    Transfers Overall transfer level: Needs assistance Equipment used: 1 person hand held assist Transfers: Sit  to/from Stand, Bed to chair/wheelchair/BSC Sit to Stand: Contact guard assist, Min assist     Step pivot transfers: Min assist            Balance Overall balance assessment: Needs assistance Sitting-balance support: Feet supported Sitting balance-Leahy Scale: Fair     Standing balance support: Single extremity supported, Bilateral upper extremity supported Standing balance-Leahy Scale: Poor                             ADL either performed or assessed with clinical judgement   ADL   Eating/Feeding: Minimal assistance;Sitting;Set up   Grooming: Wash/dry hands;Wash/dry face;Set up;Minimal assistance;Sitting           Upper Body Dressing : Minimal assistance;Moderate assistance;Sitting   Lower Body Dressing: Moderate assistance;Sitting/lateral leans;Sit to/from stand   Toilet Transfer: Minimal assistance;Stand-pivot;BSC/3in1                   Vision   Vision Assessment?: No apparent visual deficits     Perception Perception: Not tested       Praxis Praxis: Not tested       Pertinent Vitals/Pain Pain Assessment Pain Assessment: Faces Faces Pain Scale: No hurt Pain Intervention(s): Monitored during session     Extremity/Trunk Assessment Upper Extremity Assessment Upper Extremity Assessment: Overall WFL for tasks assessed   Lower Extremity Assessment Lower Extremity Assessment: Defer to PT evaluation   Cervical / Trunk Assessment Cervical / Trunk Assessment: Kyphotic   Communication Communication Communication: Difficulty communicating thoughts/reduced clarity of speech  Cognition Arousal: Alert Behavior During Therapy: Flat affect Overall Cognitive Status: History of cognitive impairments - at baseline                                       General Comments   VSS on RA.  No indication of dizziness.      Exercises     Shoulder Instructions      Home Living Family/patient expects to be discharged to::  Skilled nursing facility                                        Prior Functioning/Environment Prior Level of Function : Needs assist               ADLs Comments: Assume assist as needed from staff.        OT Problem List: Impaired balance (sitting and/or standing)      OT Treatment/Interventions: Self-care/ADL training;Therapeutic activities;Balance training    OT Goals(Current goals can be found in the care plan section) Acute Rehab OT Goals OT Goal Formulation: Patient unable to participate in goal setting Time For Goal Achievement: 11/10/22 Potential to Achieve Goals: Good ADL Goals Pt Will Perform Grooming: with set-up;sitting;standing Pt Will Perform Upper Body Dressing: with min assist;sitting;with contact guard assist Pt Will Transfer to Toilet: with contact guard assist;with supervision;stand pivot transfer;regular height toilet  OT Frequency: Min 1X/week    Co-evaluation              AM-PAC OT "6 Clicks" Daily Activity     Outcome Measure Help from another person eating meals?: A Little Help from another person taking care of personal grooming?: A Little Help from another person toileting, which includes using toliet, bedpan, or urinal?: A Lot Help from another person bathing (including washing, rinsing, drying)?: A Lot Help from another person to put on and taking off regular upper body clothing?: A Lot Help from another person to put on and taking off regular lower body clothing?: A Lot 6 Click Score: 14   End of Session Nurse Communication: Mobility status  Activity Tolerance: Patient tolerated treatment well Patient left: in chair;with call bell/phone within reach  OT Visit Diagnosis: Unsteadiness on feet (R26.81)                Time: 6301-6010 OT Time Calculation (min): 17 min Charges:  OT General Charges $OT Visit: 1 Visit OT Evaluation $OT Eval Moderate Complexity: 1 Mod  10/27/2022  RP, OTR/L  Acute Rehabilitation  Services  Office:  [phone]   Suzanna Obey 10/27/2022, 8:46 AM

## 2022-10-27 NOTE — Assessment & Plan Note (Addendum)
Severe sepsis on admission with 3/4 SIRS criteria, most likely due to UTI. IVF d/c'd on 10/13 as pt hemodynamically stable. Chronically ill but well-appearing and improving overall picture. Elevated temperature overnight, highest recorded 100.2. Transitioned from IV CTX to PO keflex on 10/14 based on susceptibility date from prior UTI as urine culture not completed this admission. -- recheck Urine cx today -- continue cephalexin 500mg  q12h x 5 days total (started 10/14) -- Blood cultures with no growth x 2 days, will follow

## 2022-10-27 NOTE — Progress Notes (Signed)
Interpreter with ID [id number] assisted with translation,patient was sleepy ,nodded to some questions and responded to some,took her medication,patient in bed and bed in low position,will continue to monitor.

## 2022-10-28 DIAGNOSIS — N3000 Acute cystitis without hematuria: Secondary | ICD-10-CM | POA: Diagnosis not present

## 2022-10-28 DIAGNOSIS — I158 Other secondary hypertension: Secondary | ICD-10-CM

## 2022-10-28 DIAGNOSIS — R4182 Altered mental status, unspecified: Secondary | ICD-10-CM | POA: Diagnosis not present

## 2022-10-28 LAB — URINE CULTURE: Culture: NO GROWTH

## 2022-10-28 LAB — CBC
HCT: 34.5 % — ABNORMAL LOW (ref 36.0–46.0)
Hemoglobin: 11 g/dL — ABNORMAL LOW (ref 12.0–15.0)
MCH: 24.6 pg — ABNORMAL LOW (ref 26.0–34.0)
MCHC: 31.9 g/dL (ref 30.0–36.0)
MCV: 77.2 fL — ABNORMAL LOW (ref 80.0–100.0)
Platelets: 148 10*3/uL — ABNORMAL LOW (ref 150–400)
RBC: 4.47 MIL/uL (ref 3.87–5.11)
RDW: 14.2 % (ref 11.5–15.5)
WBC: 4.9 10*3/uL (ref 4.0–10.5)
nRBC: 0 % (ref 0.0–0.2)

## 2022-10-28 LAB — BASIC METABOLIC PANEL
Anion gap: 8 (ref 5–15)
BUN: 10 mg/dL (ref 8–23)
CO2: 25 mmol/L (ref 22–32)
Calcium: 8.4 mg/dL — ABNORMAL LOW (ref 8.9–10.3)
Chloride: 108 mmol/L (ref 98–111)
Creatinine, Ser: 0.66 mg/dL (ref 0.44–1.00)
GFR, Estimated: >60 mL/min (ref >60)
Glucose, Bld: 116 mg/dL — ABNORMAL HIGH (ref 70–99)
Potassium: 3.4 mmol/L — ABNORMAL LOW (ref 3.5–5.1)
Sodium: 141 mmol/L (ref 135–145)

## 2022-10-28 LAB — GLUCOSE, CAPILLARY
Glucose-Capillary: 102 mg/dL — ABNORMAL HIGH (ref 70–99)
Glucose-Capillary: 205 mg/dL — ABNORMAL HIGH (ref 70–99)
Glucose-Capillary: 270 mg/dL — ABNORMAL HIGH (ref 70–99)
Glucose-Capillary: 191 mg/dL — ABNORMAL HIGH (ref 70–99)

## 2022-10-28 MED ORDER — LOSARTAN POTASSIUM 50 MG PO TABS
100.0000 mg | ORAL_TABLET | Freq: Every day | ORAL | Status: DC
  Administered 2022-10-29: 100 mg via ORAL
  Filled 2022-10-28: qty 2

## 2022-10-28 MED ORDER — INSULIN GLARGINE 100 UNIT/ML ~~LOC~~ SOLN: 5.0000 [IU] | Freq: Every day | SUBCUTANEOUS | Status: DC

## 2022-10-28 MED ORDER — LOSARTAN POTASSIUM 50 MG PO TABS
50.0000 mg | ORAL_TABLET | Freq: Once | ORAL | Status: AC
  Administered 2022-10-28: 50 mg via ORAL
  Filled 2022-10-28: qty 1

## 2022-10-28 MED ORDER — CEPHALEXIN 500 MG PO CAPS: 500.0000 mg | ORAL_CAPSULE | Freq: Two times a day (BID) | ORAL | Status: DC

## 2022-10-28 MED ORDER — POTASSIUM CHLORIDE CRYS ER 20 MEQ PO TBCR
40.0000 meq | EXTENDED_RELEASE_TABLET | Freq: Two times a day (BID) | ORAL | Status: DC
  Administered 2022-10-28 – 2022-10-29 (×3): 40 meq via ORAL
  Filled 2022-10-28 (×3): qty 2

## 2022-10-28 NOTE — Assessment & Plan Note (Addendum)
Severe sepsis on admission with 3/4 SIRS criteria, most likely due to UTI. IVF d/c'd on 10/13 as pt hemodynamically stable. Transitioned from IV CTX to PO keflex on 10/14 based on susceptibility date from prior UTI as urine culture not completed this admission. Blood cx NGTD. Pending repeat urine cultures due to elevated temperatures yesterday (highest 100.2). No leukocytosis on AM CBC and temperatures returned to normal overnight. -- f/u urine culture -- continue cephalexin 500mg  q12h until 10/18 for 7 total days of ABX

## 2022-10-28 NOTE — Assessment & Plan Note (Signed)
Takes losartan 50mg  at home, this was held on admission due to soft pressures and AKI. AKI now resolved and systolic pressures in the 170s-180s.  --continue home losartan 50mg  daily --continue to monitor vitals

## 2022-10-28 NOTE — Progress Notes (Signed)
Elevated BP 188 74. MD made aware. Discharge cancelled in light of high BP. Daughter Gardiner Ramus made aware.

## 2022-10-28 NOTE — Plan of Care (Signed)
  Problem: Clinical Measurements: Goal: Signs and symptoms of infection will decrease Outcome: Progressing   Problem: Respiratory: Goal: Ability to maintain adequate ventilation will improve Outcome: Progressing   Problem: Clinical Measurements: Goal: Respiratory complications will improve Outcome: Progressing Goal: Cardiovascular complication will be avoided Outcome: Progressing   Problem: Activity: Goal: Risk for activity intolerance will decrease Outcome: Progressing   Problem: Nutrition: Goal: Adequate nutrition will be maintained Outcome: Progressing   Problem: Elimination: Goal: Will not experience complications related to bowel motility Outcome: Progressing   Problem: Pain Managment: Goal: General experience of comfort will improve Outcome: Progressing   Problem: Safety: Goal: Ability to remain free from injury will improve Outcome: Progressing   Problem: Skin Integrity: Goal: Risk for impaired skin integrity will decrease Outcome: Progressing

## 2022-10-28 NOTE — Assessment & Plan Note (Signed)
Awake laying in bed, more conversant compared to prior exam with appropriate responses to most questions, family states this is her baseline. EEG negative. -- Delirium precautions. -- home namenda 10mg  BID

## 2022-10-28 NOTE — Assessment & Plan Note (Signed)
A1c 7.0. Home regimen: 60 units long acting daily and 1-10 units short acting with meals. Fasting BG 80s-90s, post prandial around 150-200. Got 5 units long acting insulin and 3 units short acting over the past 24 hours. On chronic steroids due to hx of renal transplant.  --Semglee 5u daily --sensitive SSI --CBG checks with meals and at bedtime

## 2022-10-28 NOTE — Assessment & Plan Note (Addendum)
Chronic issue, gets I/O cath at ALF. This likely caused of AKI that has now resolved. Foley placed 10/13 due to retention, removed 10/14. Documented UOP yesterday with I/O cath for 2x bladder scan with >39mL -- repeat bladder scan today, if >300 consider placing indwelling foley with close urology follow up vs continuing intermittent I/O cath at facility  -- continue home bethanechol 25mg  BID -- AM BMP

## 2022-10-28 NOTE — TOC Transition Note (Signed)
Transition of Care [facility]) - CM/SW Discharge Note   Patient Details  Name: Emma Maldonado MRN: [id number] Date of Birth: 1950-05-16  [facility] of [facility]) CM/SW Contact:  Carley Hammed, LCSW Phone Number: 10/28/2022, 12:11 PM   Clinical Narrative:    Pt to be transported to [facility] via [facility]. Nurse to call report to [phone].   Final next level of care: Skilled Nursing Facility Barriers to Discharge: Barriers Resolved   Patient Goals and CMS Choice CMS Medicare.gov Compare Post Acute Care list provided to:: Patient Represenative (must comment) (VM left for daughter) Choice offered to / list presented to : Adult Children  Discharge Placement                Patient chooses bed at: [facility] [place] Patient to be transferred to facility by: [facility] Name of family member notified: Maurine Minister Patient and family notified of of transfer: 10/28/22  Discharge Plan and Services Additional resources added to the After Visit Summary for       Post Acute Care Choice: Skilled Nursing Facility                               Social Determinants of Health (SDOH) Interventions SDOH Screenings   Food Insecurity: No Food Insecurity (10/27/2022)  Housing: Patient Unable To Answer (10/27/2022)  Transportation Needs: Patient Unable To Answer (10/24/2022)  Utilities: Patient Unable To Answer (10/24/2022)  Social Connections: Unknown (05/26/2021)   Received from [facility]  Tobacco Use: Low Risk  (06/23/2022)   Received from [facility]     Readmission Risk Interventions    01/03/2021    2:24 PM 01/03/2021    2:16 PM  Readmission Risk Prevention Plan  Transportation Screening  Complete  HRI or Home Care Consult Complete   Social Work Consult for Recovery Care Planning/Counseling Complete   Palliative Care Screening Not Applicable   Medication Review Ocean[employer])  Complete

## 2022-10-28 NOTE — Progress Notes (Signed)
Report called to Buzzy Han RN of [facility]. No further questions at this time.

## 2022-10-28 NOTE — Assessment & Plan Note (Signed)
Stable. Known hx osteoporosis. PT/OT recommend return to SNF. -- Continue Tylenol every 4 hours as needed -- Likely will not require orthopedic intervention --PT/OT eval and treat while inpatient -- Fall precautions

## 2022-10-28 NOTE — Care Management Important Message (Signed)
Important Message  Patient Details  Name: Emma Maldonado MRN: [id number] Date of Birth: 12/01/1950   Important Message Given:  Yes - Medicare IM     Sherilyn Banker 10/28/2022, 4:36 PM

## 2022-10-28 NOTE — Discharge Summary (Cosign Needed Addendum)
Family Medicine Teaching [facility] Discharge Summary  Patient name: Emma Maldonado Medical record number: [id number] Date of birth: [birth year] Age: 72 y.o. Gender: female Date of Admission: 10/24/2022  Date of Discharge: 10/28/22 Admitting Physician: Tomie China, MD  Primary Care Provider: Pcp, No Consultants: none  Indication for Hospitalization: altered mental status  Discharge Diagnoses/Problem List:  Principal Problem for Admission: altered mental status Other Problems addressed during stay:  Principal Problem:   Altered mental status Active Problems:   UTI (urinary tract infection)   Hypertension   Type 2 diabetes mellitus with hyperglycemia (HCC)   Sepsis (HCC)   Closed fracture of second lumbar vertebra (HCC)   Fracture of L2 vertebra [facility])   Urinary retention    Brief Hospital Course:  Emma Maldonado is a 72 y.o. female who was admitted to the Family Medicine Teaching Service at [facility] for AMS in the setting of UTI. Hospital course is outlined below by problem.   AMS I Urosepsis Patient presented with a 1 day history of altered mental status after a unwitnessed fall in her ALF. She has urinary retention at baseline. Has presented to the hospital for urosepsis multiple times over the last 2 years.  Found to have UTI.  Unfortunately, cultures were not completed. Started on empiric IV ceftriaxone 1 g/day.  Workup was negative for other etiologies: (CT head negative.  EEG negative.)  Later in day of admission, she began spiking temperatures to 103 with BPs and 80s over 30s. Code sepsis initiated. Pt quickly improved with fluid administration. Remained hemodynamically stable throughout the remainder of hospital stay. Transitioned to PO Keflex on 10/14 based on susceptibility data from prior UTI in Feb 2024. Mental status returned to baseline (as described by family) by 10/14.  AKI Cr: 2.32 (BL 0.5) on admission.  Patient has urinary retention at baseline  and takes bethanechol 25mg  TID. Is in-and-out cathed occasional at her ALF. Begun on IV LR 150 cc/h (maintenance X1.5). Also received multiple boluses of LR for volume resuscitation. Foley placed due to urinary retention, removed on 10/14. Pt had as needed in and out cath for the remainder of hospital stay.  At the end of her admission, patient's renal function improved back to her baseline with Cr 0.66 and BUN 10.Marland Kitchen  Hypokalemia Potassium on admission 3.4 ? 3.2. Likely in setting of decreased PO.  Repleted potassium as needed throughout stay.  Hypertension Home losartan 50mg  was held initially due to sepsis. Once this resolved, systolic pressures were elevated around 170s-180s and losartan 50mg  was restarted.  Type 2 DM A1c 7.0. Fasting blood sugars were around 80s-90s and post prandials around 150-200 during hospital stay. Of note, pt is on prednisone and florinef due to hx of renal transplant. Pt was stable on 5 units long acting insulin and sensitive sliding scale. Regimen was adjusted as needed and pt was discharged on 5 units long acting insulin with sliding scale.  L2 vertebral fracture Likely in the setting of previously diagnosed osteoporosis. Indeterminate age.  Pain controlled with tylenol.  Other conditions that were chronic and stable: Right kidney transplant: tacrolimus 0.5 daily, prednisone 5 mg, fludrocortisone 0.2mg  Hyperlipidemia: crestor 5mg  daily  Issues for follow up: Review BP medications Recommend recheck BMP for K Review diabetes regimen Make appt with urology to consider placing foley for urinary retention    Disposition: ALF  Discharge Condition: stable  Discharge Exam:  Vitals:   10/29/22 0435 10/29/22 0826  BP: (!) 148/56 (!) 165/61  Pulse: 73  75  Resp: 20   Temp: 98.3 F (36.8 C) 99.3 F (37.4 C)  SpO2: 92% 95%   General: Older female resting in bed, pleasant and cooperative with exam, in NAD Cardiovascular: RRR normal S1/S2, no murmurs, rubs,  gallops Respiratory: CTAB to anterior lung fields, no wheezes, rales, rhonchi, normal WOB on RA Abdomen: Normoactive bowel sounds, soft, nontender, nondistended Extremities: No edema to BLE Neuro: awake and alert, responds appropriately to questions Psych: Calm, cooperative with exam  Significant Procedures: none  Significant Labs and Imaging:  Recent Labs  Lab 10/28/22 0719  WBC 4.9  HGB 11.0*  HCT 34.5*  PLT 148*   Recent Labs  Lab 10/28/22 0719 10/29/22 0643  NA 141 139  K 3.4* 3.9  CL 108 104  CO2 25 23  GLUCOSE 116* 149*  BUN 10 7*  CREATININE 0.66 0.60  CALCIUM 8.4* 8.5*    CT head:  1. No acute intracranial abnormality. 2. Advanced calcified atherosclerosis, negative for age noncontrast CT appearance of the brain. 3. Mild chronic paranasal sinus disease.  CT renal stone study 1. Diffuse urinary bladder wall thickening is more pronounced near the dome of the urinary bladder. This may represent a cystitis. Cystic neoplasm is not excluded. 2. Acute/subacute 40% superior endplate fracture at L2 demonstrates 40% loss of height anteriorly. No significant retropulsed bone is present. 3. 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 4. Chronic renal atrophy. 5. Colonic diverticulosis without diverticulitis. 6. Coronary artery disease. 7.  Aortic Atherosclerosis (ICD10-I70.0).    Results/Tests Pending at Time of Discharge: urine culture  Discharge Medications:  Allergies as of 10/29/2022   No Known Allergies      Medication List     STOP taking these medications    Lantus SoloStar 100 UNIT/ML Solostar Pen Generic drug: insulin glargine   Tresiba FlexTouch 100 UNIT/ML FlexTouch Pen Generic drug: insulin degludec Replaced by: insulin glargine 100 UNIT/ML injection       TAKE these medications    acetaminophen 500 MG tablet Commonly known as: TYLENOL Take 500 mg by mouth every 4 (four) hours as needed for fever (Temp >  100.5).   bethanechol 25 MG tablet Commonly known as: URECHOLINE Take 25 mg by mouth 3 (three) times daily.   cephALEXin 500 MG capsule Commonly known as: KEFLEX Take 1 capsule (500 mg total) by mouth every 12 (twelve) hours for 3 doses.   cholestyramine 4 g packet Commonly known as: QUESTRAN Take 4 g by mouth See admin instructions. Mix 4 grams as directed and take 2 times a day- should be taken two hours before or after any other medications   fludrocortisone 0.1 MG tablet Commonly known as: FLORINEF Take 0.2 mg by mouth at bedtime.   HumaLOG 100 UNIT/ML injection Generic drug: insulin lispro Inject 0-10 Units into the skin See admin instructions. Inject 0-10 units into the skin three times a day before meals AND at bedtime, per sliding scale: BGL 0-149 = give nothing; 150-200 = 2 units; 201-250 = 4 units; 251-300 = 6 units; 301-350 = 8 units; 351-400 = 10 units; 400-1,000 = 10 units and CALL MD!   insulin glargine 100 UNIT/ML injection Commonly known as: Lantus Inject 0.05 mLs (5 Units total) into the skin daily. Replaces: Evaristo Bury FlexTouch 100 UNIT/ML FlexTouch Pen   levothyroxine 125 MCG tablet Commonly known as: SYNTHROID Take 125 mcg by mouth daily before breakfast.   loperamide 2 MG capsule Commonly known as: IMODIUM Take 2 mg by mouth  every 4 (four) hours as needed (for diarrhea).   losartan 100 MG tablet Commonly known as: COZAAR Take 1 tablet (100 mg total) by mouth daily. Start taking on: October 30, 2022 What changed:  medication strength how much to take   magnesium hydroxide 400 MG/5ML suspension Commonly known as: MILK OF MAGNESIA Take 30 mLs by mouth daily as needed (for no B/M after 3 days).   memantine 10 MG tablet Commonly known as: NAMENDA Take 10 mg by mouth every 12 (twelve) hours.   multivitamin with minerals Tabs tablet Take 1 tablet by mouth daily.   potassium chloride SA 20 MEQ tablet Commonly known as: KLOR-CON M Take 20 mEq by mouth  2 (two) times daily.   predniSONE 5 MG tablet Commonly known as: DELTASONE Take 5 mg by mouth every morning.   PROSTAT PO Take 30 mLs by mouth 2 (two) times daily.   rosuvastatin 5 MG tablet Commonly known as: CRESTOR Take 5 mg by mouth at bedtime.   tacrolimus 0.5 MG capsule Commonly known as: PROGRAF Take 1 capsule (0.5 mg total) by mouth daily.   Vitamin D3 125 MCG (5000 UT) Tabs Take 5,000 Units by mouth daily.         Discharge Instructions: Please refer to Patient Instructions section of EMR for full details.  Patient was counseled important signs and symptoms that should prompt return to medical care, changes in medications, dietary instructions, activity restrictions, and follow up appointments.   Follow-Up Appointments:  Contact information for after-discharge care     Destination     [facility] Preferred SNF .   Service: Skilled Nursing Contact information: [address] [place] [address] [phone]                   Returning to [facility]  [place], Tacey Ruiz, MD 10/29/2022, 11:01 AM PGY-1, [facility]  I agree with the assessment and plan as documented in the resident's note.  Darral Dash, DO               10/29/2022, 11:01 AM

## 2022-10-28 NOTE — Progress Notes (Signed)
Daily Progress Note Intern Pager: [phone]  Patient name: Emma Maldonado Medical record number: [id number] Date of birth: 1950-06-21 Age: 72 y.o. Gender: female  Primary Care Provider: Pcp, No Consultants: none Code Status: DNR-limited  Pt Overview and Major Events to Date:  10/12 admitted to FMTS  Assessment and Plan: Diala Firmin is a 72 year old female here with altered mental status most likely secondary to UTI with subsequent AKI. Pt has dementia and is minimally communicative at baseline and resides at an ALF. Blood cultures negative and initial urine cultures not collected. Elevated temperatures yesterday without true fever. Pending repeat urine cultures. Overall, pt much improved and back to mental status baseline per family. Likely d/c back to ALF today.   Pertinent PMH/PSH includes dementia, T2DM, hypertension, s/p renal transplant.  Assessment & Plan Sepsis (HCC) Severe sepsis on admission with 3/4 SIRS criteria, most likely due to UTI. IVF d/c'd on 10/13 as pt hemodynamically stable. Transitioned from IV CTX to PO keflex on 10/14 based on susceptibility date from prior UTI as urine culture not completed this admission. Blood cx NGTD. Pending repeat urine cultures due to elevated temperatures yesterday (highest 100.2). No leukocytosis on AM CBC and temperatures returned to normal overnight. -- f/u urine culture -- continue cephalexin 500mg  q12h until 10/18 for 7 total days of ABX  Altered mental status Awake laying in bed, more conversant compared to prior exam with appropriate responses to most questions, family states this is her baseline. EEG negative. -- Delirium precautions. -- home namenda 10mg  BID Urinary retention Chronic issue, gets I/O cath at ALF. This likely caused of AKI that has now resolved. Foley placed 10/13 due to retention, removed 10/14. Documented UOP yesterday with I/O cath for 2x bladder scan with >337mL -- repeat bladder scan  today, if >300 consider placing indwelling foley with close urology follow up vs continuing intermittent I/O cath at facility  -- continue home bethanechol 25mg  BID -- AM BMP Type 2 diabetes mellitus with hyperglycemia (HCC) A1c 7.0. Home regimen: 60 units long acting daily and 1-10 units short acting with meals. Fasting BG 80s-90s, post prandial around 150-200. Got 5 units long acting insulin and 3 units short acting over the past 24 hours. On chronic steroids due to hx of renal transplant.  --Semglee 5u daily --sensitive SSI --CBG checks with meals and at bedtime Hypertension Takes losartan 50mg  at home, this was held on admission due to soft pressures and AKI. AKI now resolved and systolic pressures in the 170s-180s.  --continue home losartan 50mg  daily --continue to monitor vitals Fracture of L2 vertebra (HCC) Stable. Known hx osteoporosis. PT/OT recommend return to SNF. -- Continue Tylenol every 4 hours as needed -- Likely will not require orthopedic intervention --PT/OT eval and treat while inpatient -- Fall precautions   Chronic and Stable Issues: Right kidney transplant: Continue tacrolimus 0.5 daily, PO prednisone 5 mg Hyperlipidemia: crestor 5mg  daily  FEN/GI: DYS 3 PPx: lovenox Dispo: pending clinical improvement, will return to ALF  Subjective:  Patient resting comfortably in bed, opens eyes to voice and responds to some questions.  Patient states she feels good today.  Denies abdominal pain.  Discussed that the plan is for her to go back to her facility today.  Patient did not have any questions or concerns.  Objective: Temp:  [97.6 F (36.4 C)-100.2 F (37.9 C)] 97.6 F (36.4 C) (10/16 0427) Pulse Rate:  [63-82] 63 (10/16 0427) Resp:  [16-18] 18 (10/16 0427) BP: (156-183)/(68-71)  183/69 (10/16 0427) SpO2:  [97 %-100 %] 100 % (10/16 0427) Physical Exam: General: Older female sitting up in bed, pleasant and somewhat interactive with exam, in NAD Cardiovascular:  RRR, normal S1/S2 Respiratory: CTAB, normal WOB on RA Abdomen: Normoactive bowel sounds, soft, nontender, nondistended Extremities: No edema to BLE Neuro: Awake and alert.  Oriented to self and location, not date.  Responds appropriately to some questions Psych: Calm, cooperative .  Laboratory: Most recent CBC Lab Results  Component Value Date   WBC 4.9 10/28/2022   HGB 11.0 (L) 10/28/2022   HCT 34.5 (L) 10/28/2022   MCV 77.2 (L) 10/28/2022   PLT 148 (L) 10/28/2022   Most recent BMP    Latest Ref Rng & Units 10/27/2022    6:42 AM  BMP  Glucose 70 - 99 mg/dL 213   BUN 8 - 23 mg/dL 12   Creatinine 0.86 - 1.00 mg/dL 5.78   Sodium 469 - 629 mmol/L 141   Potassium 3.5 - 5.1 mmol/L 3.2   Chloride 98 - 111 mmol/L 110   CO2 22 - 32 mmol/L 22   Calcium 8.9 - 10.3 mg/dL 8.4      Imaging/Diagnostic Tests: None  Lorayne Bender, MD 10/28/2022, 7:54 AM  PGY-1, [place] [facility] FPTS Intern pager: [phone], text pages welcome Secure chat group [facility] Teaching Service

## 2022-10-29 DIAGNOSIS — R4182 Altered mental status, unspecified: Secondary | ICD-10-CM | POA: Diagnosis not present

## 2022-10-29 LAB — BASIC METABOLIC PANEL
Anion gap: 12 (ref 5–15)
BUN: 7 mg/dL — ABNORMAL LOW (ref 8–23)
CO2: 23 mmol/L (ref 22–32)
Calcium: 8.5 mg/dL — ABNORMAL LOW (ref 8.9–10.3)
Chloride: 104 mmol/L (ref 98–111)
Creatinine, Ser: 0.6 mg/dL (ref 0.44–1.00)
GFR, Estimated: >60 mL/min (ref >60)
Glucose, Bld: 149 mg/dL — ABNORMAL HIGH (ref 70–99)
Potassium: 3.9 mmol/L (ref 3.5–5.1)
Sodium: 139 mmol/L (ref 135–145)

## 2022-10-29 LAB — CULTURE, BLOOD (ROUTINE X 2)
Culture: NO GROWTH
Culture: NO GROWTH

## 2022-10-29 LAB — GLUCOSE, CAPILLARY
Glucose-Capillary: 225 mg/dL — ABNORMAL HIGH (ref 70–99)
Glucose-Capillary: 151 mg/dL — ABNORMAL HIGH (ref 70–99)

## 2022-10-29 MED ORDER — CEPHALEXIN 500 MG PO CAPS: 500.0000 mg | ORAL_CAPSULE | Freq: Two times a day (BID) | ORAL | Status: AC

## 2022-10-29 MED ORDER — LOSARTAN POTASSIUM 100 MG PO TABS: 100.0000 mg | ORAL_TABLET | Freq: Every day | ORAL | Status: DC

## 2022-10-29 NOTE — TOC Transition Note (Addendum)
Transition of Care [facility]) - CM/SW Discharge Note   Patient Details  Name: Jahmiya Guidotti MRN: [id number] Date of Birth: [birth year]  Transition of Care [facility]) CM/SW Contact:  Michaela Corner, LCSWA Phone Number: 10/29/2022, 10:59 AM   Clinical Narrative:   Pt going to [facility] [place] via [facility]. Nurse call to report [phone].    Final next level of care: Skilled Nursing Facility Barriers to Discharge: Barriers Resolved   Patient Goals and CMS Choice CMS Medicare.gov Compare Post Acute Care list provided to:: Patient Represenative (must comment) (VM left for daughter) Choice offered to / list presented to : Adult Children  Discharge Placement                Patient chooses bed at: [facility] [place] Patient to be transferred to facility by: [facility] Name of family member notified: Gardiner Ramus - LCSWA left VM Patient and family notified of of transfer: 10/29/22  Discharge Plan and Services Additional resources added to the After Visit Summary for       Post Acute Care Choice: Skilled Nursing Facility                               Social Determinants of Health (SDOH) Interventions SDOH Screenings   Food Insecurity: No Food Insecurity (10/27/2022)  Housing: Patient Unable To Answer (10/27/2022)  Transportation Needs: Patient Unable To Answer (10/24/2022)  Utilities: Patient Unable To Answer (10/24/2022)  Social Connections: Unknown (05/26/2021)   Received from [facility]  Tobacco Use: Low Risk  (06/23/2022)   Received from [facility]     Readmission Risk Interventions    01/03/2021    2:24 PM 01/03/2021    2:16 PM  Readmission Risk Prevention Plan  Transportation Screening  Complete  HRI or Home Care Consult Complete   Social Work Consult for Recovery Care Planning/Counseling Complete   Palliative Care Screening Not Applicable   Medication Review Oceanographer)  Complete

## 2022-10-29 NOTE — Assessment & Plan Note (Signed)
Takes losartan 50mg  at home, this was held on admission due to soft pressures and AKI. AKI now resolved. Systolic pressures remain in the 170s-180s on 50mg  losartan, incrased to 100mg  yesterday. --continue losartan 100mg  daily --continue to monitor vitals

## 2022-10-29 NOTE — Assessment & Plan Note (Signed)
Awake laying in bed, more conversant compared to prior exam with appropriate responses to most questions, family states this is her baseline. EEG negative. -- Delirium precautions. -- home namenda 10mg  BID

## 2022-10-29 NOTE — Assessment & Plan Note (Addendum)
Chronic issue, gets I/O cath at ALF. This likely caused of AKI that has now resolved. Foley placed 10/13 due to retention, removed 10/14. Documented UOP yesterday with I/O cath for bladder scan >362mL -- continue bladder scan q4 and I/O cath for >300 mL on 2 scans -- recommend close urology follow up for possible indwelling foley vs continuing intermittent I/O cath at facility -- continue home bethanechol 25mg  BID -- AM BMP

## 2022-10-29 NOTE — Progress Notes (Signed)
Left the unit on stretcher accompanied by EMS.

## 2022-10-29 NOTE — Assessment & Plan Note (Signed)
Stable. Known hx osteoporosis. PT/OT recommend return to SNF. -- Continue Tylenol every 4 hours as needed -- Likely will not require orthopedic intervention --PT/OT eval and treat while inpatient -- Fall precautions

## 2022-10-29 NOTE — Progress Notes (Signed)
Report called to Buzzy Han RN at [facility]. No further questions at this time.

## 2022-10-29 NOTE — Progress Notes (Signed)
Daily Progress Note Intern Pager: [phone]  Patient name: Emma Maldonado Medical record number: [id number] Date of birth: Mar 25, 1950 Age: 72 y.o. Gender: female  Primary Care Provider: Pcp, No Consultants: none Code Status: full code  Pt Overview and Major Events to Date:  10/12: admitted to [facility]  Assessment and Plan: Emma Maldonado is a 72 year old female here with altered mental status most likely secondary to UTI with subsequent AKI. Pt has dementia and is minimally communicative at baseline and resides at an ALF. Blood cultures negative and initial urine cultures not collected, repeat cultures with no growth. Elevated blood pressures yesterday, increased losartan. Overall, pt much improved and back to mental status baseline per family. Likely d/c back to ALF today.   Pertinent PMH/PSH includes dementia, T2DM, hypertension, s/p renal transplant.   Assessment & Plan Sepsis (HCC) Severe sepsis on admission with 3/4 SIRS criteria, most likely due to UTI. IVF d/c'd on 10/13 as pt hemodynamically stable. Transitioned from IV CTX to PO keflex on 10/14 based on susceptibility from prior UTI, repeat urine cultures with no growth. Blood cx NGTD. Afebrile and no leukocytosis.  -- f/u urine culture -- continue cephalexin 500mg  q12h until 10/18 for 7 total days of ABX  Altered mental status Awake laying in bed, more conversant compared to prior exam with appropriate responses to most questions, family states this is her baseline. EEG negative. -- Delirium precautions. -- home namenda 10mg  BID Urinary retention Chronic issue, gets I/O cath at ALF. This likely caused of AKI that has now resolved. Foley placed 10/13 due to retention, removed 10/14. Documented UOP yesterday with I/O cath for bladder scan >345mL -- continue bladder scan q4 and I/O cath for >300 mL on 2 scans -- recommend close urology follow up for possible indwelling foley vs continuing intermittent I/O cath  at facility -- continue home bethanechol 25mg  BID -- AM BMP Type 2 diabetes mellitus with hyperglycemia (HCC) A1c 7.0. Home regimen: 60 units long acting daily and 1-10 units short acting with meals. Fasting BG 80s-90s, post prandial around 150-200. Got 5 units long acting insulin and 8 units short acting over the past 24 hours. On chronic steroids due to hx of renal transplant.  --Semglee 5u daily --sensitive SSI --CBG checks with meals and at bedtime Hypertension Takes losartan 50mg  at home, this was held on admission due to soft pressures and AKI. AKI now resolved. Systolic pressures remain in the 170s-180s on 50mg  losartan, incrased to 100mg  yesterday. --continue losartan 100mg  daily --continue to monitor vitals Fracture of L2 vertebra (HCC) Stable. Known hx osteoporosis. PT/OT recommend return to SNF. -- Continue Tylenol every 4 hours as needed -- Likely will not require orthopedic intervention --PT/OT eval and treat while inpatient -- Fall precautions    Chronic and Stable Issues: Right kidney transplant: Continue tacrolimus 0.5 daily, PO prednisone 5 mg, florinef 0.2mg  Hyperlipidemia: crestor 5mg  daily  FEN/GI: DYS 3 PPx: lovenox Dispo: pending discharge back to ALF possibly today  Subjective:  Patient resting comfortably in bed this morning.  States she is doing well, denies abdominal pain.   Discussed plan to hopefully send her back to her facility today. Does not have any questions or concerns.   Objective: Temp:  [97.5 F (36.4 C)-99.3 F (37.4 C)] 99.3 F (37.4 C) (10/17 0826) Pulse Rate:  [66-75] 75 (10/17 0826) Resp:  [17-20] 20 (10/17 0435) BP: (148-188)/(56-74) 165/61 (10/17 0826) SpO2:  [92 %-98 %] 95 % (10/17 0826) Physical Exam: General:  Older female resting in bed, pleasant and cooperative with exam, in NAD Cardiovascular: RRR normal S1/S2, no murmurs, rubs, gallops Respiratory: CTAB to anterior lung fields, no wheezes, rales, rhonchi, normal WOB on  RA Abdomen: Normoactive bowel sounds, soft, nontender, nondistended Extremities: No edema to BLE  Laboratory: Most recent CBC Lab Results  Component Value Date   WBC 4.9 10/28/2022   HGB 11.0 (L) 10/28/2022   HCT 34.5 (L) 10/28/2022   MCV 77.2 (L) 10/28/2022   PLT 148 (L) 10/28/2022   Most recent BMP    Latest Ref Rng & Units 10/29/2022    6:43 AM  BMP  Glucose 70 - 99 mg/dL 782   BUN 8 - 23 mg/dL 7   Creatinine 9.56 - 2.13 mg/dL 0.86   Sodium 578 - 469 mmol/L 139   Potassium 3.5 - 5.1 mmol/L 3.9   Chloride 98 - 111 mmol/L 104   CO2 22 - 32 mmol/L 23   Calcium 8.9 - 10.3 mg/dL 8.5     Imaging/Diagnostic Tests: None  Lorayne Bender, MD 10/29/2022, 8:38 AM  PGY-1, [facility] FPTS Intern pager: [phone], text pages welcome Secure chat group [facility] Teaching Service

## 2022-10-29 NOTE — Assessment & Plan Note (Signed)
A1c 7.0. Home regimen: 60 units long acting daily and 1-10 units short acting with meals. Fasting BG 80s-90s, post prandial around 150-200. Got 5 units long acting insulin and 8 units short acting over the past 24 hours. On chronic steroids due to hx of renal transplant.  --Semglee 5u daily --sensitive SSI --CBG checks with meals and at bedtime

## 2022-10-29 NOTE — Assessment & Plan Note (Signed)
Severe sepsis on admission with 3/4 SIRS criteria, most likely due to UTI. IVF d/c'd on 10/13 as pt hemodynamically stable. Transitioned from IV CTX to PO keflex on 10/14 based on susceptibility from prior UTI, repeat urine cultures with no growth. Blood cx NGTD. Afebrile and no leukocytosis.  -- f/u urine culture -- continue cephalexin 500mg  q12h until 10/18 for 7 total days of ABX

## 2022-11-01 NOTE — Plan of Care (Signed)
[facility] Tonsillectomy/Adenoidectomy, Postoperative PEDS care plan entered in error.

## 2023-01-06 IMAGING — DX DG CHEST 1V PORT
1 series · 1 of 1 positions shown · non-contrast
Comparison: 03/24/2020

CLINICAL DATA: Questionable sepsis, evaluate for abnormality.

EXAM:
PORTABLE CHEST 1 VIEW

[chest]
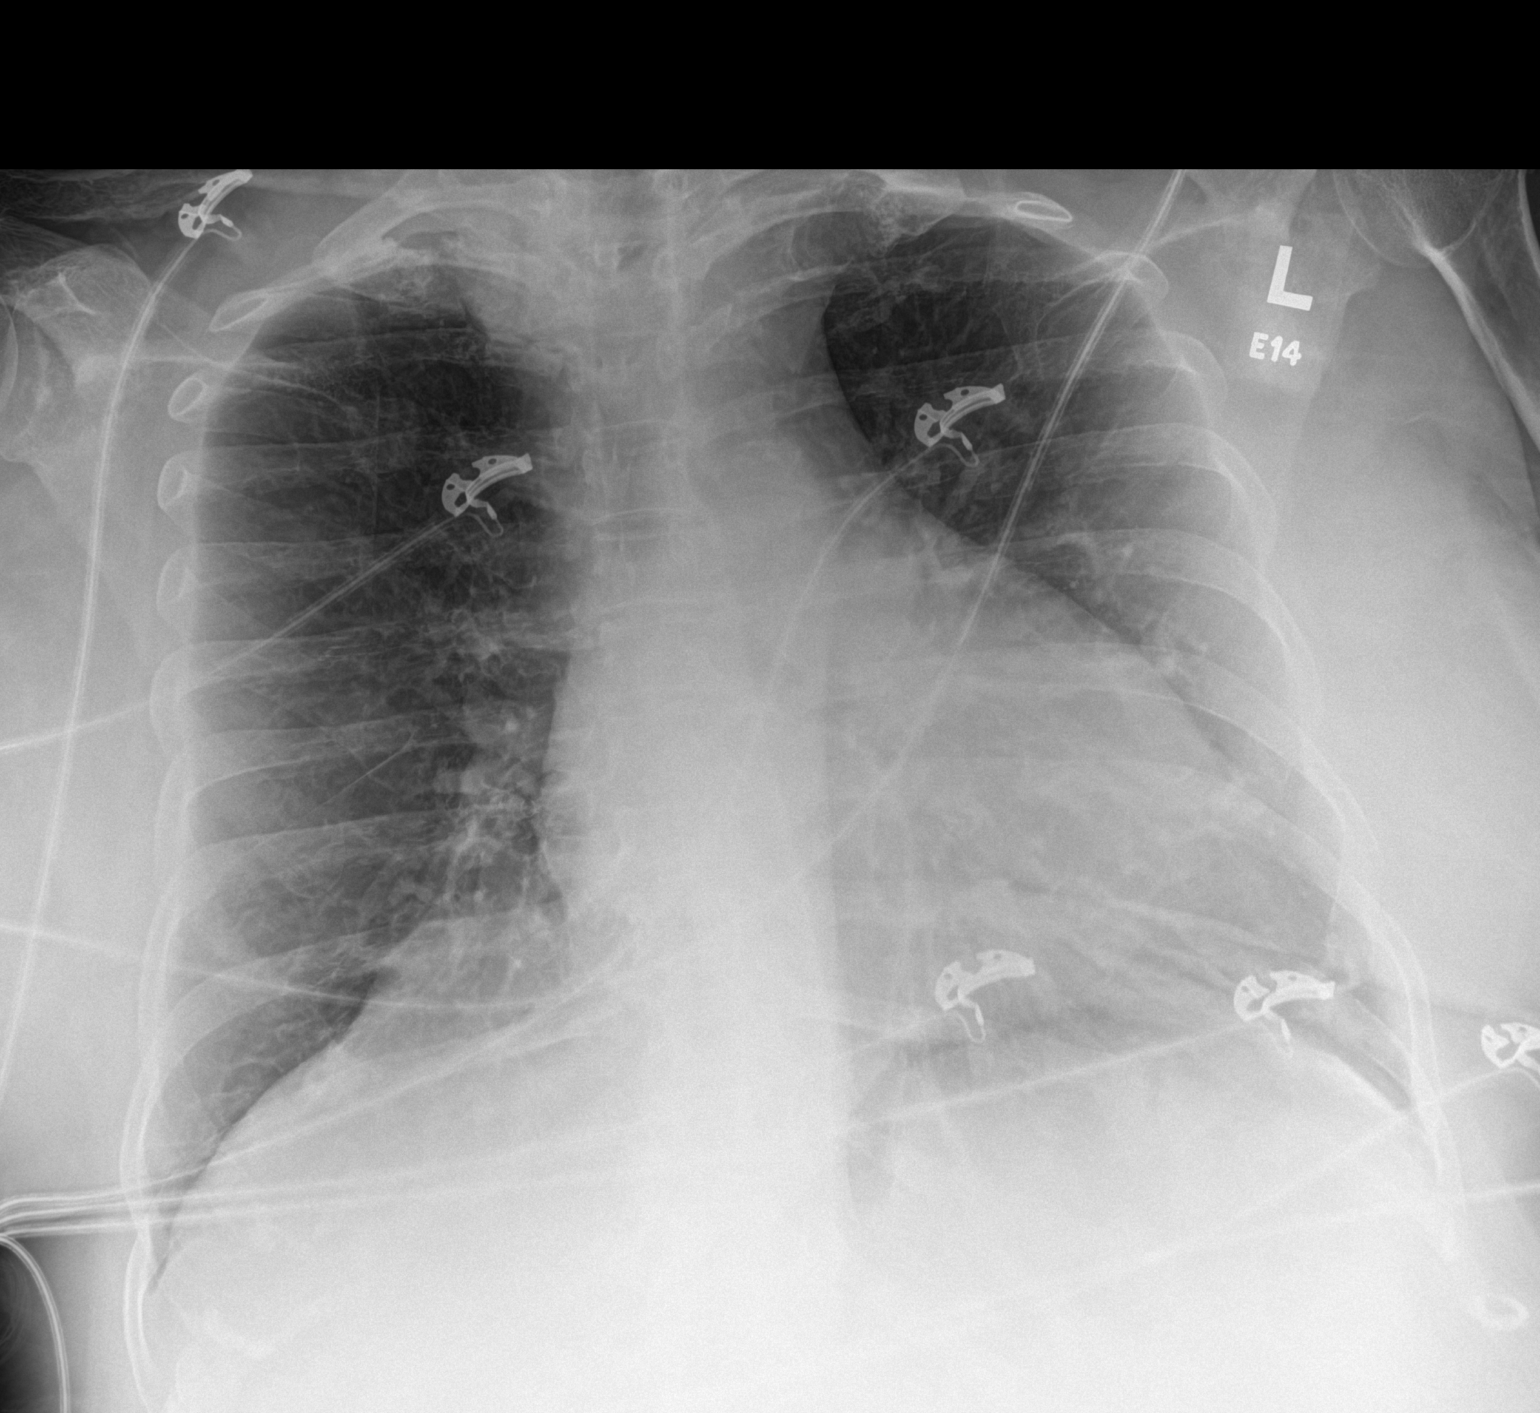

[1 of 1 positions shown; findings below may reference images not displayed]

FINDINGS: Single view of the chest was obtained. Lungs are clear without focal
airspace disease. Heart size is prominent but likely accentuated by
the projection and AP technique. Trachea is midline. Negative for a
pneumothorax. No acute bone abnormality.
IMPRESSION: No active disease.

## 2023-01-11 IMAGING — DX DG CHEST 1V PORT
1 series · 1 of 1 positions shown · non-contrast
Comparison: 08/17/2020

CLINICAL DATA: Shortness of breath.

EXAM:
PORTABLE CHEST 1 VIEW

[chest ap]
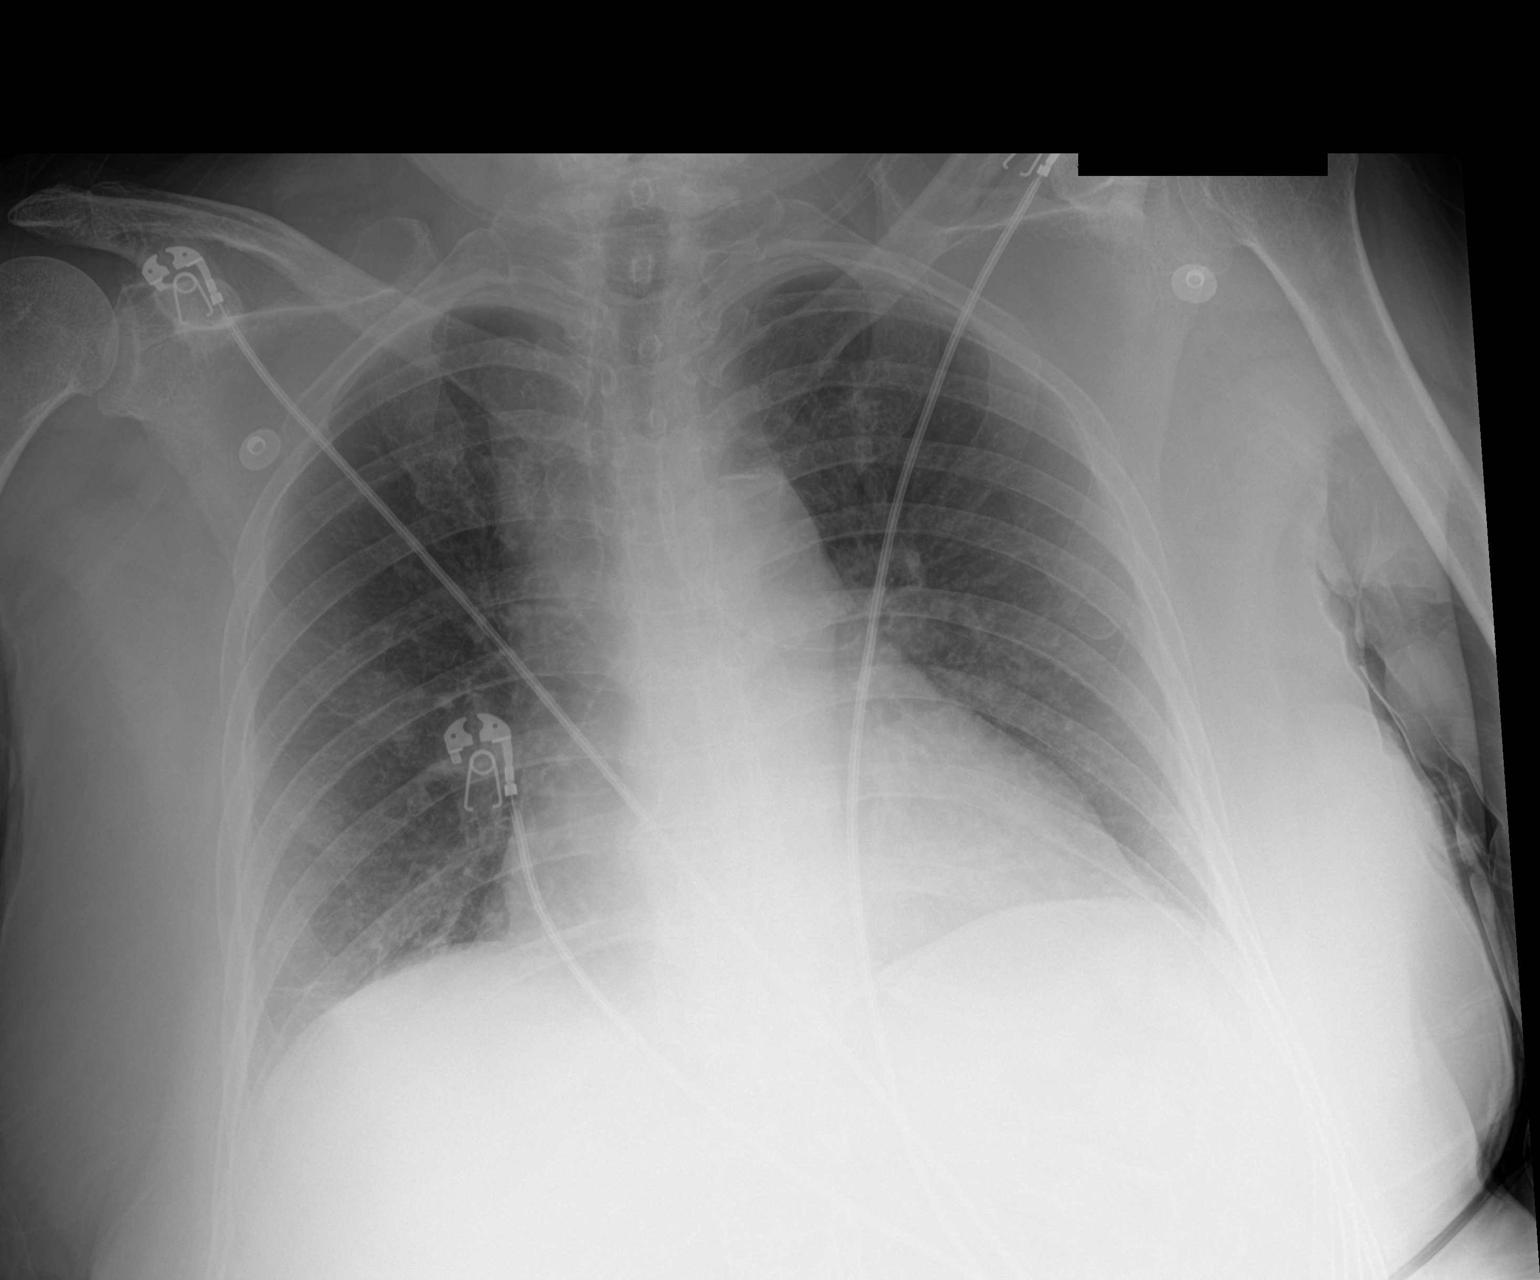

[1 of 1 positions shown; findings below may reference images not displayed]

FINDINGS: The cardiac silhouette, mediastinal and hilar contours are within
normal limits given the AP projection and portable technique. The
lungs are clear of an acute process. No pleural effusions. No
pulmonary lesions. The bony thorax is intact.
IMPRESSION: No acute cardiopulmonary findings.

## 2023-04-02 ENCOUNTER — Emergency Department (HOSPITAL_COMMUNITY)

## 2023-04-02 ENCOUNTER — Encounter (HOSPITAL_COMMUNITY): Payer: Self-pay

## 2023-04-02 ENCOUNTER — Other Ambulatory Visit: Payer: Self-pay

## 2023-04-02 ENCOUNTER — Inpatient Hospital Stay (HOSPITAL_COMMUNITY)
Admission: EM | Admit: 2023-04-02 | Discharge: 2023-04-06 | DRG: 640 | Disposition: A | Discharge status: Skilled Nursing Facility | Source: Skilled Nursing Facility | Attending: Internal Medicine | Admitting: Internal Medicine

## 2023-04-02 DIAGNOSIS — R4182 Altered mental status, unspecified: Secondary | ICD-10-CM | POA: Diagnosis not present

## 2023-04-02 DIAGNOSIS — E860 Dehydration: Secondary | ICD-10-CM | POA: Diagnosis not present

## 2023-04-02 DIAGNOSIS — Z1152 Encounter for screening for COVID-19: Secondary | ICD-10-CM

## 2023-04-02 DIAGNOSIS — E16A1 Hypoglycemia level 1: Secondary | ICD-10-CM | POA: Diagnosis not present

## 2023-04-02 DIAGNOSIS — E119 Type 2 diabetes mellitus without complications: Secondary | ICD-10-CM

## 2023-04-02 DIAGNOSIS — Z79899 Other long term (current) drug therapy: Secondary | ICD-10-CM

## 2023-04-02 DIAGNOSIS — Z800 Family history of malignant neoplasm of digestive organs: Secondary | ICD-10-CM

## 2023-04-02 DIAGNOSIS — Z794 Long term (current) use of insulin: Secondary | ICD-10-CM

## 2023-04-02 DIAGNOSIS — D631 Anemia in chronic kidney disease: Secondary | ICD-10-CM | POA: Diagnosis present

## 2023-04-02 DIAGNOSIS — I10 Essential (primary) hypertension: Secondary | ICD-10-CM | POA: Diagnosis present

## 2023-04-02 DIAGNOSIS — Z940 Kidney transplant status: Secondary | ICD-10-CM

## 2023-04-02 DIAGNOSIS — F03918 Unspecified dementia, unspecified severity, with other behavioral disturbance: Secondary | ICD-10-CM

## 2023-04-02 DIAGNOSIS — E876 Hypokalemia: Secondary | ICD-10-CM | POA: Diagnosis not present

## 2023-04-02 DIAGNOSIS — Z833 Family history of diabetes mellitus: Secondary | ICD-10-CM

## 2023-04-02 DIAGNOSIS — Z79890 Hormone replacement therapy (postmenopausal): Secondary | ICD-10-CM

## 2023-04-02 DIAGNOSIS — G9341 Metabolic encephalopathy: Secondary | ICD-10-CM | POA: Diagnosis not present

## 2023-04-02 DIAGNOSIS — R339 Retention of urine, unspecified: Secondary | ICD-10-CM | POA: Diagnosis present

## 2023-04-02 DIAGNOSIS — Z87440 Personal history of urinary (tract) infections: Secondary | ICD-10-CM

## 2023-04-02 DIAGNOSIS — N179 Acute kidney failure, unspecified: Secondary | ICD-10-CM | POA: Diagnosis present

## 2023-04-02 DIAGNOSIS — N1831 Chronic kidney disease, stage 3a: Secondary | ICD-10-CM | POA: Diagnosis present

## 2023-04-02 DIAGNOSIS — Z7951 Long term (current) use of inhaled steroids: Secondary | ICD-10-CM

## 2023-04-02 DIAGNOSIS — E039 Hypothyroidism, unspecified: Secondary | ICD-10-CM | POA: Diagnosis present

## 2023-04-02 DIAGNOSIS — R197 Diarrhea, unspecified: Secondary | ICD-10-CM | POA: Diagnosis not present

## 2023-04-02 DIAGNOSIS — I129 Hypertensive chronic kidney disease with stage 1 through stage 4 chronic kidney disease, or unspecified chronic kidney disease: Secondary | ICD-10-CM | POA: Diagnosis present

## 2023-04-02 DIAGNOSIS — E1122 Type 2 diabetes mellitus with diabetic chronic kidney disease: Secondary | ICD-10-CM | POA: Diagnosis present

## 2023-04-02 DIAGNOSIS — Z90710 Acquired absence of both cervix and uterus: Secondary | ICD-10-CM

## 2023-04-02 DIAGNOSIS — Z66 Do not resuscitate: Secondary | ICD-10-CM | POA: Diagnosis present

## 2023-04-02 DIAGNOSIS — E11649 Type 2 diabetes mellitus with hypoglycemia without coma: Secondary | ICD-10-CM | POA: Diagnosis not present

## 2023-04-02 DIAGNOSIS — Z79621 Long term (current) use of calcineurin inhibitor: Secondary | ICD-10-CM

## 2023-04-02 LAB — URINALYSIS, W/ REFLEX TO CULTURE (INFECTION SUSPECTED)
Bacteria, UA: NONE SEEN
Bilirubin Urine: NEGATIVE
Glucose, UA: NEGATIVE mg/dL
Hgb urine dipstick: NEGATIVE
Ketones, ur: NEGATIVE mg/dL
Leukocytes,Ua: NEGATIVE
Nitrite: NEGATIVE
Protein, ur: NEGATIVE mg/dL
Specific Gravity, Urine: 1.012 (ref 1.005–1.030)
pH: 5 (ref 5.0–8.0)

## 2023-04-02 LAB — RESP PANEL BY RT-PCR (RSV, FLU A&B, COVID)  RVPGX2
Influenza A by PCR: NEGATIVE
Influenza B by PCR: NEGATIVE
Resp Syncytial Virus by PCR: NEGATIVE
SARS Coronavirus 2 by RT PCR: NEGATIVE

## 2023-04-02 LAB — CBC WITH DIFFERENTIAL/PLATELET
Abs Immature Granulocytes: 0.03 10*3/uL (ref 0.00–0.07)
Basophils Absolute: 0 10*3/uL (ref 0.0–0.1)
Basophils Relative: 1 %
Eosinophils Absolute: 0.1 10*3/uL (ref 0.0–0.5)
Eosinophils Relative: 2 %
HCT: 32.9 % — ABNORMAL LOW (ref 36.0–46.0)
Hemoglobin: 10 g/dL — ABNORMAL LOW (ref 12.0–15.0)
Immature Granulocytes: 1 %
Lymphocytes Relative: 32 %
Lymphs Abs: 1.3 10*3/uL (ref 0.7–4.0)
MCH: 24.4 pg — ABNORMAL LOW (ref 26.0–34.0)
MCHC: 30.4 g/dL (ref 30.0–36.0)
MCV: 80.2 fL (ref 80.0–100.0)
Monocytes Absolute: 0.5 10*3/uL (ref 0.1–1.0)
Monocytes Relative: 12 %
Neutro Abs: 2.1 10*3/uL (ref 1.7–7.7)
Neutrophils Relative %: 52 %
Platelets: 155 10*3/uL (ref 150–400)
RBC: 4.1 MIL/uL (ref 3.87–5.11)
RDW: 17 % — ABNORMAL HIGH (ref 11.5–15.5)
WBC: 4.1 10*3/uL (ref 4.0–10.5)
nRBC: 0 % (ref 0.0–0.2)

## 2023-04-02 LAB — COMPREHENSIVE METABOLIC PANEL
ALT: 21 U/L (ref 0–44)
AST: 22 U/L (ref 15–41)
Albumin: 2.3 g/dL — ABNORMAL LOW (ref 3.5–5.0)
Alkaline Phosphatase: 47 U/L (ref 38–126)
Anion gap: 5 (ref 5–15)
BUN: 32 mg/dL — ABNORMAL HIGH (ref 8–23)
CO2: 24 mmol/L (ref 22–32)
Calcium: 8.5 mg/dL — ABNORMAL LOW (ref 8.9–10.3)
Chloride: 107 mmol/L (ref 98–111)
Creatinine, Ser: 1.06 mg/dL — ABNORMAL HIGH (ref 0.44–1.00)
GFR, Estimated: 55 mL/min — ABNORMAL LOW (ref >60)
Glucose, Bld: 94 mg/dL (ref 70–99)
Potassium: 4.2 mmol/L (ref 3.5–5.1)
Sodium: 136 mmol/L (ref 135–145)
Total Bilirubin: 0.6 mg/dL (ref 0.0–1.2)
Total Protein: 5.6 g/dL — ABNORMAL LOW (ref 6.5–8.1)

## 2023-04-02 LAB — CBG MONITORING, ED
Glucose-Capillary: 71 mg/dL (ref 70–99)
Glucose-Capillary: 53 mg/dL — ABNORMAL LOW (ref 70–99)

## 2023-04-02 LAB — VITAMIN B12: Vitamin B-12: 463 pg/mL (ref 180–914)

## 2023-04-02 LAB — I-STAT CG4 LACTIC ACID, ED: Lactic Acid, Venous: 1 mmol/L (ref 0.5–1.9)

## 2023-04-02 LAB — TSH: TSH: 2.523 u[IU]/mL (ref 0.350–4.500)

## 2023-04-02 MED ORDER — ADULT MULTIVITAMIN W/MINERALS CH
1.0000 | ORAL_TABLET | Freq: Every day | ORAL | Status: DC
Start: 2023-04-03 — End: 2023-04-06
  Administered 2023-04-03 – 2023-04-06 (×4): 1 via ORAL
  Filled 2023-04-02 (×4): qty 1

## 2023-04-02 MED ORDER — ADULT MULTIVITAMIN W/MINERALS CH: 1.0000 | ORAL_TABLET | Freq: Every day | ORAL | Status: DC

## 2023-04-02 MED ORDER — TACROLIMUS 0.5 MG PO CAPS
0.5000 mg | ORAL_CAPSULE | Freq: Every day | ORAL | Status: DC
  Administered 2023-04-03 – 2023-04-06 (×4): 0.5 mg via ORAL
  Filled 2023-04-02 (×6): qty 1

## 2023-04-02 MED ORDER — FLUDROCORTISONE ACETATE 0.1 MG PO TABS
0.2000 mg | ORAL_TABLET | Freq: Every day | ORAL | Status: DC
  Administered 2023-04-02 – 2023-04-05 (×4): 0.2 mg via ORAL
  Filled 2023-04-02 (×4): qty 2

## 2023-04-02 MED ORDER — INSULIN ASPART 100 UNIT/ML IJ SOLN
0.0000 [IU] | INTRAMUSCULAR | Status: DC
  Filled 2023-04-02: qty 0.09

## 2023-04-02 MED ORDER — ENOXAPARIN SODIUM 40 MG/0.4ML IJ SOSY
40.0000 mg | PREFILLED_SYRINGE | INTRAMUSCULAR | Status: DC
  Administered 2023-04-02 – 2023-04-05 (×4): 40 mg via SUBCUTANEOUS
  Filled 2023-04-02 (×4): qty 0.4

## 2023-04-02 MED ORDER — INSULIN ASPART 100 UNIT/ML IJ SOLN
0.0000 [IU] | INTRAMUSCULAR | Status: DC
  Administered 2023-04-03: 2 [IU] via SUBCUTANEOUS
  Administered 2023-04-04: 1 [IU] via SUBCUTANEOUS
  Administered 2023-04-04 (×2): 5 [IU] via SUBCUTANEOUS
  Administered 2023-04-05: 1 [IU] via SUBCUTANEOUS
  Administered 2023-04-05 – 2023-04-06 (×3): 2 [IU] via SUBCUTANEOUS
  Administered 2023-04-06: 1 [IU] via SUBCUTANEOUS
  Filled 2023-04-02: qty 0.09

## 2023-04-02 MED ORDER — DEXTROSE 50 % IV SOLN: 25.0000 g | INTRAVENOUS | Status: AC

## 2023-04-02 MED ORDER — BETHANECHOL CHLORIDE 25 MG PO TABS
25.0000 mg | ORAL_TABLET | Freq: Three times a day (TID) | ORAL | Status: DC
  Administered 2023-04-02 – 2023-04-06 (×11): 25 mg via ORAL
  Filled 2023-04-02 (×14): qty 1

## 2023-04-02 MED ORDER — ACETAMINOPHEN 500 MG PO TABS: 500.0000 mg | ORAL_TABLET | ORAL | Status: DC | PRN

## 2023-04-02 MED ORDER — TACROLIMUS 0.5 MG PO CAPS: 0.5000 mg | ORAL_CAPSULE | Freq: Every day | ORAL | Status: DC

## 2023-04-02 MED ORDER — VITAMIN D 25 MCG (1000 UNIT) PO TABS
5000.0000 [IU] | ORAL_TABLET | Freq: Every day | ORAL | Status: DC
  Administered 2023-04-03 – 2023-04-06 (×3): 5000 [IU] via ORAL
  Filled 2023-04-02 (×3): qty 5

## 2023-04-02 MED ORDER — SODIUM CHLORIDE 0.9 % IV BOLUS
500.0000 mL | Freq: Once | INTRAVENOUS | Status: AC
  Administered 2023-04-02: 500 mL via INTRAVENOUS

## 2023-04-02 MED ORDER — LEVOTHYROXINE SODIUM 25 MCG PO TABS: 125.0000 ug | ORAL_TABLET | Freq: Every day | ORAL | Status: DC

## 2023-04-02 MED ORDER — VITAMIN D3 125 MCG (5000 UT) PO TABS: 5000.0000 [IU] | ORAL_TABLET | Freq: Every day | ORAL | Status: DC

## 2023-04-02 MED ORDER — SODIUM CHLORIDE 0.9 % IV SOLN: INTRAVENOUS | Status: AC

## 2023-04-02 MED ORDER — LACTATED RINGERS IV BOLUS (SEPSIS)
1000.0000 mL | Freq: Once | INTRAVENOUS | Status: AC
  Administered 2023-04-02: 1000 mL via INTRAVENOUS

## 2023-04-02 MED ORDER — PREDNISONE 5 MG PO TABS
5.0000 mg | ORAL_TABLET | Freq: Every morning | ORAL | Status: DC
  Administered 2023-04-03 – 2023-04-06 (×4): 5 mg via ORAL
  Filled 2023-04-02 (×4): qty 1

## 2023-04-02 MED ORDER — MEMANTINE HCL 10 MG PO TABS
10.0000 mg | ORAL_TABLET | Freq: Two times a day (BID) | ORAL | Status: DC
  Administered 2023-04-02 – 2023-04-06 (×8): 10 mg via ORAL
  Filled 2023-04-02 (×7): qty 1
  Filled 2023-04-02: qty 2

## 2023-04-02 MED ORDER — LEVOTHYROXINE SODIUM 125 MCG PO TABS
125.0000 ug | ORAL_TABLET | Freq: Every day | ORAL | Status: DC
  Administered 2023-04-03 – 2023-04-06 (×4): 125 ug via ORAL
  Filled 2023-04-02 (×4): qty 1

## 2023-04-02 NOTE — ED Notes (Signed)
 Attempted to call pts daughter with an update, went to voicemail.

## 2023-04-02 NOTE — ED Notes (Signed)
Pt tolerating oral fluids 

## 2023-04-02 NOTE — ED Notes (Signed)
 Patient transported to CT

## 2023-04-02 NOTE — ED Triage Notes (Signed)
 Pt from facility, being treated for uti and bronchitis, confusion started yesterday, today pt very lethargic, hx of only having one kidney, on transplant list. Pt lethargic, able to be aroused with sternal rub, not answering questions  2L HOT

## 2023-04-02 NOTE — H&P (Signed)
 History and Physical    Emma Maldonado Emma Maldonado DOB: July 14, 1950 DOA: 04/02/2023  PCP: Pcp, No  Patient coming from: ALF.  Patient lives in [address] [place]  I have personally briefly reviewed patient's old medical records available.   Chief Complaint: Lethargic.  Excessively sleepy for 2 days.  HPI: Emma Maldonado is a 73 y.o. female with medical history significant of dementia, recurrent UTI, type 2 diabetes, kidney transplant on Prograf and prednisone who recently suffered from UTI and treated with 3 days of Rocephin 2 weeks ago sent to the hospital from ALF with being more lethargic for last few days.  Patient reportedly treated with Rocephin and also she had suffered some kind of bronchitis.  Transfer report indicates more confusion beginning yesterday. Patient's point of contact is her daughter, ER physician stated that he was able to talk to her daughter who reported advanced dementia but baseline able to have conversation and recognize family members.  Family also reported that patient has episodes where she does not want to talk but never been persistent like this. Called the number on the chart, she did not pick up the phone.  ED Course: Patient is hemodynamically stable.  She is on room air.  She is comfortably sleepy and difficult to arouse.  She responds to verbal,, follows simple commands but goes back to sleep. Urinalysis is normal.  Foley catheter was exchanged in the ER. Electrolytes and white cell counts are normal. Chest x-ray is normal.  CT head is nonacute. Due to patient's persistent lethargy, admission requested for monitoring.  Review of Systems: all systems are reviewed and pertinent positive as per HPI otherwise rest are negative.    Past Medical History:  Diagnosis Date   Dementia (HCC)    Diabetes mellitus without complication (HCC)    Hypertension    Renal disorder    Renal insufficiency     Past Surgical History:  Procedure Laterality  Date   ABDOMINAL HYSTERECTOMY     BIOPSY  12/28/2020   Procedure: BIOPSY;  Surgeon: Kathi Der, MD;  Location: [facility];  Service: Gastroenterology;;   CHOLECYSTECTOMY     FLEXIBLE SIGMOIDOSCOPY N/A 12/28/2020   Procedure: FLEXIBLE SIGMOIDOSCOPY;  Surgeon: Kathi Der, MD;  Location: [facility];  Service: Gastroenterology;  Laterality: N/A;   NEPHRECTOMY TRANSPLANTED ORGAN      Social history   reports that she has never smoked. She has never used smokeless tobacco. She reports that she does not drink alcohol and does not use drugs.  No Known Allergies  Family History  Problem Relation Age of Onset   Diabetes Mellitus II Father    Colon cancer Father    Colon cancer Brother      Prior to Admission medications   Medication Sig Start Date End Date Taking? Authorizing Provider  acetaminophen (TYLENOL) 500 MG tablet Take 500 mg by mouth every 4 (four) hours as needed for fever (Temp > 100.5).    [provider]  bethanechol (URECHOLINE) 25 MG tablet Take 25 mg by mouth 3 (three) times daily.    [provider]  Cholecalciferol (VITAMIN D3) 125 MCG (5000 UT) TABS Take 5,000 Units by mouth daily.    [provider]  cholestyramine (QUESTRAN) 4 g packet Take 4 g by mouth See admin instructions. Mix 4 grams as directed and take 2 times a day- should be taken two hours before or after any other medications    [provider]  fludrocortisone (FLORINEF) 0.1 MG tablet  Take 0.2 mg by mouth at bedtime.    [provider]  HUMALOG 100 UNIT/ML injection Inject 0-10 Units into the skin See admin instructions. Inject 0-10 units into the skin three times a day before meals AND at bedtime, per sliding scale: BGL 0-149 = give nothing; 150-200 = 2 units; 201-250 = 4 units; 251-300 = 6 units; 301-350 = 8 units; 351-400 = 10 units; 400-1,000 = 10 units and CALL MD!    [provider]  insulin glargine (LANTUS) 100 UNIT/ML injection  Inject 0.05 mLs (5 Units total) into the skin daily. 10/28/22   Lorayne Bender, MD  levothyroxine (SYNTHROID) 125 MCG tablet Take 125 mcg by mouth daily before breakfast.    [provider]  loperamide (IMODIUM) 2 MG capsule Take 2 mg by mouth every 4 (four) hours as needed (for diarrhea). 02/20/20   [provider]  losartan (COZAAR) 100 MG tablet Take 1 tablet (100 mg total) by mouth daily. 10/30/22   Dameron, Nolberto Hanlon, DO  magnesium hydroxide (MILK OF MAGNESIA) 400 MG/5ML suspension Take 30 mLs by mouth daily as needed (for no B/M after 3 days).    [provider]  memantine (NAMENDA) 10 MG tablet Take 10 mg by mouth every 12 (twelve) hours.    [provider]  Multiple Vitamin (MULTIVITAMIN WITH MINERALS) TABS tablet Take 1 tablet by mouth daily. 08/26/20   [name], MD  Pollen Extracts (PROSTAT PO) Take 30 mLs by mouth 2 (two) times daily.    [provider]  potassium chloride SA (KLOR-CON M) 20 MEQ tablet Take 20 mEq by mouth 2 (two) times daily.    [provider]  predniSONE (DELTASONE) 5 MG tablet Take 5 mg by mouth every morning.    [provider]  rosuvastatin (CRESTOR) 5 MG tablet Take 5 mg by mouth at bedtime. 12/01/19   [provider]  tacrolimus (PROGRAF) 0.5 MG capsule Take 1 capsule (0.5 mg total) by mouth daily. 03/28/12   Eddie North, MD    Physical Exam: Vitals:   04/02/23 1630 04/02/23 1820 04/02/23 1826 04/02/23 1851  BP: 136/62 (!) 102/50    Pulse: 78 73    Resp: 18 17    Temp:    98.7 F (37.1 C)  TempSrc:    Oral  SpO2: 97% 100%    Weight:   70.3 kg   Height:   5\' 3"  (1.6 m)     Constitutional: Patient is comfortably sleeping.  She responds with her name for every questions.  Answers "Emma Maldonado".  Patient sounds congested.  Vitals:   04/02/23 1630 04/02/23 1820 04/02/23 1826 04/02/23 1851  BP: 136/62 (!) 102/50    Pulse: 78 73    Resp: 18 17    Temp:    98.7 F (37.1 C)  TempSrc:     Oral  SpO2: 97% 100%    Weight:   70.3 kg   Height:   5\' 3"  (1.6 m)    Eyes: PERRL, lids and conjunctivae normal ENMT: Mucous membranes are moist. Posterior pharynx clear of any exudate or lesions. Neck: normal, supple, no masses, no thyromegaly.  No rigidity. Respiratory: clear to auscultation bilaterally, no wheezing, no crackles. Normal respiratory effort. No accessory muscle use.  Patient has some upper airway noise.  She is on room air. Cardiovascular: Regular rate and rhythm, no murmurs / rubs / gallops. No extremity edema. 2+ pedal pulses. No carotid bruits.  Abdomen: no tenderness, no masses palpated. No  hepatosplenomegaly. Bowel sounds positive.  Musculoskeletal: no clubbing / cyanosis. No joint deformity upper and lower extremities. Good ROM, no contractures. Normal muscle tone.  Skin: no rashes, lesions, ulcers. No induration Neurologic: Patient is mostly sleepy.  She follows simple commands.  Uses all extremities equally.  Pleasantly confused.  Strength 5/5 in all 4.  Psychiatric: Compromised judgment and insight. Alert on stimulation but not oriented.    Labs on Admission: I have personally reviewed following labs and imaging studies  CBC: Recent Labs  Lab 04/02/23 1534  WBC 4.1  NEUTROABS 2.1  HGB 10.0*  HCT 32.9*  MCV 80.2  PLT 155   Basic Metabolic Panel: Recent Labs  Lab 04/02/23 1534  NA 136  K 4.2  CL 107  CO2 24  GLUCOSE 94  BUN 32*  CREATININE 1.06*  CALCIUM 8.5*   GFR: Estimated Creatinine Clearance: 44.5 mL/min (A) (by C-G formula based on SCr of 1.06 mg/dL (H)). Liver Function Tests: Recent Labs  Lab 04/02/23 1534  AST 22  ALT 21  ALKPHOS 47  BILITOT 0.6  PROT 5.6*  ALBUMIN 2.3*   No results for input(s): "LIPASE", "AMYLASE" in the last 168 hours. No results for input(s): "AMMONIA" in the last 168 hours. Coagulation Profile: No results for input(s): "INR", "PROTIME" in the last 168 hours. Cardiac Enzymes: No results for input(s):  "CKTOTAL", "CKMB", "CKMBINDEX", "TROPONINI" in the last 168 hours. BNP (last 3 results) No results for input(s): "PROBNP" in the last 8760 hours. HbA1C: No results for input(s): "HGBA1C" in the last 72 hours. CBG: No results for input(s): "GLUCAP" in the last 168 hours. Lipid Profile: No results for input(s): "CHOL", "HDL", "LDLCALC", "TRIG", "CHOLHDL", "LDLDIRECT" in the last 72 hours. Thyroid Function Tests: No results for input(s): "TSH", "T4TOTAL", "FREET4", "T3FREE", "THYROIDAB" in the last 72 hours. Anemia Panel: No results for input(s): "VITAMINB12", "FOLATE", "FERRITIN", "TIBC", "IRON", "RETICCTPCT" in the last 72 hours. Urine analysis:    Component Value Date/Time   COLORURINE YELLOW 04/02/2023 1644   APPEARANCEUR CLEAR 04/02/2023 1644   LABSPEC 1.012 04/02/2023 1644   PHURINE 5.0 04/02/2023 1644   GLUCOSEU NEGATIVE 04/02/2023 1644   HGBUR NEGATIVE 04/02/2023 1644   BILIRUBINUR NEGATIVE 04/02/2023 1644   KETONESUR NEGATIVE 04/02/2023 1644   PROTEINUR NEGATIVE 04/02/2023 1644   UROBILINOGEN 1.0 08/14/2012 1022   NITRITE NEGATIVE 04/02/2023 1644   LEUKOCYTESUR NEGATIVE 04/02/2023 1644    Radiological Exams on Admission: DG Chest Port 1 View Result Date: 04/02/2023 CLINICAL DATA:  Altered mental status.  Suspected sepsis. EXAM: PORTABLE CHEST 1 VIEW COMPARISON:  10/24/2022 FINDINGS: The heart size and mediastinal contours are within normal limits. Both lungs are clear. The visualized skeletal structures are unremarkable. IMPRESSION: No active disease. Electronically Signed   By: Danae Orleans M.D.   On: 04/02/2023 17:13   CT Head Wo Contrast Result Date: 04/02/2023 CLINICAL DATA:  Mental status change, unknown cause.  Lethargy. EXAM: CT HEAD WITHOUT CONTRAST TECHNIQUE: Contiguous axial images were obtained from the base of the skull through the vertex without intravenous contrast. RADIATION DOSE REDUCTION: This exam was performed according to the departmental dose-optimization  program which includes automated exposure control, adjustment of the mA and/or kV according to patient size and/or use of iterative reconstruction technique. COMPARISON:  Head CT 03/15/2023 FINDINGS: Brain: There is no evidence of an acute infarct, intracranial hemorrhage, mass, midline shift, or extra-axial fluid collection. Mild cerebral atrophy is within normal limits for age. Vascular: Calcified atherosclerosis at the skull base. No hyperdense vessel.  Skull: No acute fracture or suspicious lesion. Sinuses/Orbits: Moderately extensive opacification of the ethmoid sinuses and left greater than right sphenoid sinus fluid. Clear mastoid air cells. No acute finding in the included portions of the orbits. Other: None. IMPRESSION: 1. No evidence of acute intracranial abnormality. 2. Ethmoid and sphenoid sinus inflammation. Electronically Signed   By: Sebastian Ache M.D.   On: 04/02/2023 16:45    EKG: Independently reviewed.  Normal sinus rhythm.  Assessment/Plan Active Problems:   History of renal transplant   Diabetes mellitus (HCC)   Hypertension   Dementia with behavioral disturbance (HCC)   Hypothyroidism   Urinary retention   Acute metabolic encephalopathy     1.  Somnolence, acute metabolic encephalopathy in a patient with advanced dementia: Patient without evidence of infection.  Without evidence of focal neurological deficit.  This is likely her dementia and behavior disturbances.  Patient currently remains quite stable and able to respond without focal deficits.  Recently treated for UTI. CT head without any acute findings. Will keep on maintenance IV fluids, monitor overnight.  Speech, PT and OT in the morning to determine appropriateness to go back to ALF. Will check TSH, B12 and folic acid levels. Continue with Namenda.  Fall precautions.  Delirium precautions.  2.  CKD stage IIIa, solitary kidney status post transplant: Her renal functions are stable.  Electrolytes are adequate.   Patient is on Prograf, prednisone and Florinef that will be continued.  3.  Hypothyroidism: Resume Synthroid when she is able to use safe to take by mouth.  4.  Hypertension: Blood pressure is stable.  5.  Chronic urinary retention: With long-term Foley catheter use.  It was exchanged today.  6.  Type 2 diabetes: Well-controlled.  On sliding scale and 5 units of Lantus at nursing home.  She is without any reliable oral intake.  Will keep on low-dose sliding scale every 4 hours.   DVT prophylaxis: Lovenox subcu Code Status: DNR with limited intervention, documentation present in the chart.  Patient comes from the nursing home with gold DNR paper. Family Communication: None. Disposition Plan: Back to nursing home Consults called: None Admission status: Observation.  Telemetry.   Dorcas Carrow MD [facility]

## 2023-04-02 NOTE — ED Provider Notes (Signed)
 [place] EMERGENCY DEPARTMENT AT [facility] Provider Note   CSN: [id number] Arrival date & time: 04/02/23  1443     History  Chief Complaint  Patient presents with   Altered Mental Status    Emma Maldonado is a 73 y.o. female with a history of dementia, urinary tract infections, type 2 diabetes, chronic urinary retention, presented to the ED with concern for lethargy.  Patient is a level 5 caveat due to dementia cannot provide further information.  Per facility and EMS report, the patient did been treated for UTI recently, as well as bronchitis, and reportedly had "confusion" beginning yesterday, reportedly quite lethargic.  Patient would not answer questions to my exam.  Per reviewing her facility listed.  She was treated with Rocephin for 3 days for UTI about 2 weeks ago.  Update, the patient's daughter is not present at the bedside.  She reports that the patient does have advanced dementia but at baseline is able to have a conversation with her, recognizes her, and is conversant.  They report the patient sometimes has "episodes" where she does not want to talk, but has never had a persistent episode of sleeping like this and not responding. They do feel this is a change sudden from her baseline  She arrives with signed DNR form at bedside  HPI     Home Medications Prior to Admission medications   Medication Sig Start Date End Date Taking? Authorizing Provider  acetaminophen (TYLENOL) 500 MG tablet Take 500 mg by mouth every 4 (four) hours as needed for fever (Temp > 100.5).    [provider]  bethanechol (URECHOLINE) 25 MG tablet Take 25 mg by mouth 3 (three) times daily.    [provider]  Cholecalciferol (VITAMIN D3) 125 MCG (5000 UT) TABS Take 5,000 Units by mouth daily.    [provider]  cholestyramine (QUESTRAN) 4 g packet Take 4 g by mouth See admin instructions. Mix 4 grams as directed and take 2 times a day- should be  taken two hours before or after any other medications    [provider]  fludrocortisone (FLORINEF) 0.1 MG tablet Take 0.2 mg by mouth at bedtime.    [provider]  HUMALOG 100 UNIT/ML injection Inject 0-10 Units into the skin See admin instructions. Inject 0-10 units into the skin three times a day before meals AND at bedtime, per sliding scale: BGL 0-149 = give nothing; 150-200 = 2 units; 201-250 = 4 units; 251-300 = 6 units; 301-350 = 8 units; 351-400 = 10 units; 400-1,000 = 10 units and CALL MD!    [provider]  insulin glargine (LANTUS) 100 UNIT/ML injection Inject 0.05 mLs (5 Units total) into the skin daily. 10/28/22   Lorayne Bender, MD  levothyroxine (SYNTHROID) 125 MCG tablet Take 125 mcg by mouth daily before breakfast.    [provider]  loperamide (IMODIUM) 2 MG capsule Take 2 mg by mouth every 4 (four) hours as needed (for diarrhea). 02/20/20   [provider]  losartan (COZAAR) 100 MG tablet Take 1 tablet (100 mg total) by mouth daily. 10/30/22   Dameron, Nolberto Hanlon, DO  magnesium hydroxide (MILK OF MAGNESIA) 400 MG/5ML suspension Take 30 mLs by mouth daily as needed (for no B/M after 3 days).    [provider]  memantine (NAMENDA) 10 MG tablet Take 10 mg by mouth every 12 (twelve) hours.    [provider]  Multiple Vitamin (MULTIVITAMIN WITH MINERALS) TABS tablet  Take 1 tablet by mouth daily. 08/26/20   [name], MD  Pollen Extracts (PROSTAT PO) Take 30 mLs by mouth 2 (two) times daily.    [provider]  potassium chloride SA (KLOR-CON M) 20 MEQ tablet Take 20 mEq by mouth 2 (two) times daily.    [provider]  predniSONE (DELTASONE) 5 MG tablet Take 5 mg by mouth every morning.    [provider]  rosuvastatin (CRESTOR) 5 MG tablet Take 5 mg by mouth at bedtime. 12/01/19   [provider]  tacrolimus (PROGRAF) 0.5 MG capsule Take 1 capsule (0.5 mg total) by mouth daily. 03/28/12    Dhungel, Theda Belfast, MD      Allergies    Patient has no known allergies.    Review of Systems   Review of Systems  Physical Exam Updated Vital Signs BP (!) 102/50   Pulse 73   Temp 98.7 F (37.1 C) (Oral)   Resp 17   Ht 5\' 3"  (1.6 m)   Wt 70.3 kg   SpO2 100%   BMI 27.46 kg/m  Physical Exam Constitutional:      Comments: Sleeping, somnolent, awakens to sternal rub, will not speak to me (attempted in [place] and [place])  HENT:     Head: Normocephalic and atraumatic.  Eyes:     Conjunctiva/sclera: Conjunctivae normal.     Pupils: Pupils are equal, round, and reactive to light.  Cardiovascular:     Rate and Rhythm: Normal rate and regular rhythm.  Pulmonary:     Effort: Pulmonary effort is normal. No respiratory distress.  Abdominal:     General: There is no distension.     Tenderness: There is no abdominal tenderness.  Skin:    General: Skin is warm and dry.  Neurological:     Mental Status: She is alert.     Comments: Patient will move all extremities     ED Results / Procedures / Treatments   Labs (all labs ordered are listed, but only abnormal results are displayed) Labs Reviewed  COMPREHENSIVE METABOLIC PANEL - Abnormal; Notable for the following components:      Result Value   BUN 32 (*)    Creatinine, Ser 1.06 (*)    Calcium 8.5 (*)    Total Protein 5.6 (*)    Albumin 2.3 (*)    GFR, Estimated 55 (*)    All other components within normal limits  CBC WITH DIFFERENTIAL/PLATELET - Abnormal; Notable for the following components:   Hemoglobin 10.0 (*)    HCT 32.9 (*)    MCH 24.4 (*)    RDW 17.0 (*)    All other components within normal limits  RESP PANEL BY RT-PCR (RSV, FLU A&B, COVID)  RVPGX2  CULTURE, BLOOD (ROUTINE X 2)  CULTURE, BLOOD (ROUTINE X 2)  URINALYSIS, W/ REFLEX TO CULTURE (INFECTION SUSPECTED)  I-STAT CG4 LACTIC ACID, ED    EKG EKG Interpretation Date/Time:  Friday April 02 2023 16:36:27 EDT Ventricular Rate:  78 PR  Interval:  118 QRS Duration:  89 QT Interval:  391 QTC Calculation: 446 R Axis:   74  Text Interpretation: Sinus rhythm Borderline short PR interval Confirmed by Alvester Chou [phone]) on 04/02/2023 4:48:34 PM  Radiology DG Chest Port 1 View Result Date: 04/02/2023 CLINICAL DATA:  Altered mental status.  Suspected sepsis. EXAM: PORTABLE CHEST 1 VIEW COMPARISON:  10/24/2022 FINDINGS: The heart size and mediastinal contours are within normal limits. Both lungs are clear. The visualized skeletal  structures are unremarkable. IMPRESSION: No active disease. Electronically Signed   By: Danae Orleans M.D.   On: 04/02/2023 17:13   CT Head Wo Contrast Result Date: 04/02/2023 CLINICAL DATA:  Mental status change, unknown cause.  Lethargy. EXAM: CT HEAD WITHOUT CONTRAST TECHNIQUE: Contiguous axial images were obtained from the base of the skull through the vertex without intravenous contrast. RADIATION DOSE REDUCTION: This exam was performed according to the departmental dose-optimization program which includes automated exposure control, adjustment of the mA and/or kV according to patient size and/or use of iterative reconstruction technique. COMPARISON:  Head CT 03/15/2023 FINDINGS: Brain: There is no evidence of an acute infarct, intracranial hemorrhage, mass, midline shift, or extra-axial fluid collection. Mild cerebral atrophy is within normal limits for age. Vascular: Calcified atherosclerosis at the skull base. No hyperdense vessel. Skull: No acute fracture or suspicious lesion. Sinuses/Orbits: Moderately extensive opacification of the ethmoid sinuses and left greater than right sphenoid sinus fluid. Clear mastoid air cells. No acute finding in the included portions of the orbits. Other: None. IMPRESSION: 1. No evidence of acute intracranial abnormality. 2. Ethmoid and sphenoid sinus inflammation. Electronically Signed   By: Sebastian Ache M.D.   On: 04/02/2023 16:45    Procedures Procedures     Medications Ordered in ED Medications  lactated ringers bolus 1,000 mL (0 mLs Intravenous Stopped 04/02/23 1601)  sodium chloride 0.9 % bolus 500 mL (500 mLs Intravenous New Bag/Given 04/02/23 1951)    ED Course/ Medical Decision Making/ A&P Clinical Course as of 04/02/23 2009  Fri Apr 02, 2023  1828 Daughter not answering phone. Her husband Renae Fickle by phone confirms he is estranged from the family and the daughter is the primary contact for all medical information [MT]    Clinical Course User Index [MT] Lourine Alberico, Kermit Balo, MD                                 Medical Decision Making Amount and/or Complexity of Data Reviewed Labs: ordered. Radiology: ordered.  Risk Decision regarding hospitalization.   This patient presents to the Emergency Department with complaint of altered mental status.  This involves an extensive number of treatment options, and is a complaint that carries with it a high risk of complications and morbidity.  The differential diagnosis includes hypoglycemia vs metabolic encephalopathy vs infection (including cystitis) vs ICH vs stroke vs polypharmacy vs other  I ordered, reviewed, and interpreted labs, including no emergent findings.  UA without evidence of infection.  CMP largely unremarkable, minor increase of BUN and creatinine I ordered medication fluid bolus for hypotension I ordered imaging studies which included CT head, dg chest  I independently visualized and interpreted imaging which showed no emergent findings and the monitor tracing which showed regular heart rate Additional history was obtained from EMS and patient's daughter at the bedside Previous records obtained and reviewed showing Hospitalization 10/2022 for altered mental status, which time she was noted to have urinary retention and an AKI, Foley placed due to urinary retention, was also hypokalemic, noted to have an L2 vertebral fracture.  She was reportedly status post renal transplant on  prednisone and fludrocortisone daily with tacrolimus as well.  She was treated for UTI at that time with blood culture sepsis rule out. I personally reviewed the patients ECG which showed no acute ischemic findings  After the interventions stated above, I reevaluated the patient and found patient remains equally somnolent, will arouse  to sternal rub, but then closes her eyes and goes back to sleep.  For my discussion with the family this appears to be persistent change in her baseline mental status.  Patient is not wanting or not willing to take food or water at this time.  I will plan for medical admission for altered mental status.         Final Clinical Impression(s) / ED Diagnoses Final diagnoses:  Altered mental status, unspecified altered mental status type    Rx / DC Orders ED Discharge Orders     None         Terald Sleeper, MD 04/02/23 2009

## 2023-04-03 DIAGNOSIS — Z79899 Other long term (current) drug therapy: Secondary | ICD-10-CM | POA: Diagnosis not present

## 2023-04-03 DIAGNOSIS — R4182 Altered mental status, unspecified: Secondary | ICD-10-CM | POA: Diagnosis present

## 2023-04-03 DIAGNOSIS — Z794 Long term (current) use of insulin: Secondary | ICD-10-CM

## 2023-04-03 DIAGNOSIS — E1165 Type 2 diabetes mellitus with hyperglycemia: Secondary | ICD-10-CM | POA: Diagnosis not present

## 2023-04-03 DIAGNOSIS — E039 Hypothyroidism, unspecified: Secondary | ICD-10-CM

## 2023-04-03 DIAGNOSIS — R197 Diarrhea, unspecified: Secondary | ICD-10-CM | POA: Diagnosis not present

## 2023-04-03 DIAGNOSIS — Z79621 Long term (current) use of calcineurin inhibitor: Secondary | ICD-10-CM | POA: Diagnosis not present

## 2023-04-03 DIAGNOSIS — I10 Essential (primary) hypertension: Secondary | ICD-10-CM

## 2023-04-03 DIAGNOSIS — Z7951 Long term (current) use of inhaled steroids: Secondary | ICD-10-CM | POA: Diagnosis not present

## 2023-04-03 DIAGNOSIS — E876 Hypokalemia: Secondary | ICD-10-CM | POA: Diagnosis not present

## 2023-04-03 DIAGNOSIS — Z66 Do not resuscitate: Secondary | ICD-10-CM | POA: Diagnosis present

## 2023-04-03 DIAGNOSIS — Z940 Kidney transplant status: Secondary | ICD-10-CM

## 2023-04-03 DIAGNOSIS — G9341 Metabolic encephalopathy: Secondary | ICD-10-CM | POA: Diagnosis present

## 2023-04-03 DIAGNOSIS — Z79890 Hormone replacement therapy (postmenopausal): Secondary | ICD-10-CM | POA: Diagnosis not present

## 2023-04-03 DIAGNOSIS — E16A1 Hypoglycemia level 1: Secondary | ICD-10-CM | POA: Diagnosis not present

## 2023-04-03 DIAGNOSIS — E1122 Type 2 diabetes mellitus with diabetic chronic kidney disease: Secondary | ICD-10-CM | POA: Diagnosis present

## 2023-04-03 DIAGNOSIS — N1831 Chronic kidney disease, stage 3a: Secondary | ICD-10-CM | POA: Diagnosis present

## 2023-04-03 DIAGNOSIS — F03918 Unspecified dementia, unspecified severity, with other behavioral disturbance: Secondary | ICD-10-CM | POA: Diagnosis present

## 2023-04-03 DIAGNOSIS — E11649 Type 2 diabetes mellitus with hypoglycemia without coma: Secondary | ICD-10-CM | POA: Diagnosis not present

## 2023-04-03 DIAGNOSIS — I129 Hypertensive chronic kidney disease with stage 1 through stage 4 chronic kidney disease, or unspecified chronic kidney disease: Secondary | ICD-10-CM | POA: Diagnosis present

## 2023-04-03 DIAGNOSIS — E860 Dehydration: Secondary | ICD-10-CM | POA: Diagnosis present

## 2023-04-03 DIAGNOSIS — R339 Retention of urine, unspecified: Secondary | ICD-10-CM | POA: Diagnosis present

## 2023-04-03 DIAGNOSIS — D631 Anemia in chronic kidney disease: Secondary | ICD-10-CM | POA: Diagnosis present

## 2023-04-03 DIAGNOSIS — Z1152 Encounter for screening for COVID-19: Secondary | ICD-10-CM | POA: Diagnosis not present

## 2023-04-03 DIAGNOSIS — Z87440 Personal history of urinary (tract) infections: Secondary | ICD-10-CM | POA: Diagnosis not present

## 2023-04-03 DIAGNOSIS — N179 Acute kidney failure, unspecified: Secondary | ICD-10-CM | POA: Diagnosis present

## 2023-04-03 DIAGNOSIS — Z90710 Acquired absence of both cervix and uterus: Secondary | ICD-10-CM | POA: Diagnosis not present

## 2023-04-03 LAB — GLUCOSE, CAPILLARY
Glucose-Capillary: 120 mg/dL — ABNORMAL HIGH (ref 70–99)
Glucose-Capillary: 44 mg/dL — CL (ref 70–99)
Glucose-Capillary: 155 mg/dL — ABNORMAL HIGH (ref 70–99)
Glucose-Capillary: 116 mg/dL — ABNORMAL HIGH (ref 70–99)
Glucose-Capillary: 107 mg/dL — ABNORMAL HIGH (ref 70–99)

## 2023-04-03 LAB — CBG MONITORING, ED
Glucose-Capillary: 103 mg/dL — ABNORMAL HIGH (ref 70–99)
Glucose-Capillary: 61 mg/dL — ABNORMAL LOW (ref 70–99)
Glucose-Capillary: 61 mg/dL — ABNORMAL LOW (ref 70–99)
Glucose-Capillary: 91 mg/dL (ref 70–99)
Glucose-Capillary: 99 mg/dL (ref 70–99)

## 2023-04-03 LAB — FOLATE: Folate: 10.2 ng/mL (ref >5.9)

## 2023-04-03 MED ORDER — CHLORHEXIDINE GLUCONATE CLOTH 2 % EX PADS
6.0000 | MEDICATED_PAD | Freq: Every day | CUTANEOUS | Status: DC
  Administered 2023-04-03 – 2023-04-06 (×3): 6 via TOPICAL

## 2023-04-03 MED ORDER — DEXTROSE 50 % IV SOLN
25.0000 mL | Freq: Once | INTRAVENOUS | Status: AC
  Administered 2023-04-03: 25 mL via INTRAVENOUS
  Filled 2023-04-03: qty 50

## 2023-04-03 MED ORDER — HYDRALAZINE HCL 20 MG/ML IJ SOLN
10.0000 mg | Freq: Four times a day (QID) | INTRAMUSCULAR | Status: DC | PRN
  Administered 2023-04-03: 10 mg via INTRAVENOUS
  Filled 2023-04-03: qty 1

## 2023-04-03 NOTE — ED Notes (Signed)
Pt drank 2 cups of orange juice. 

## 2023-04-03 NOTE — Progress Notes (Signed)
 [facility]                                                                               Emma Maldonado, is a 73 y.o. female, DOB - 02-10-1950, IRJ:[id number] Admit date - 04/02/2023    Outpatient Primary MD for the patient is Pcp, No  LOS - 0  days    Brief summary    Emma Maldonado is a 73 y.o. female with medical history significant of dementia, recurrent UTI, type 2 diabetes, kidney transplant on Prograf and prednisone who recently suffered from UTI and treated with 3 days of Rocephin 2 weeks ago sent to the hospital from ALF with being more lethargic for last few days. Foley catheter was exchanged in the ER.  Chest x-ray is normal.  CT head is nonacute. Due to patient's persistent lethargy, admission requested for monitoring.    Assessment & Plan    Assessment and Plan:   Acute metabolic encephalopathy in the setting of advanced dementia:  No focal deficits.  CT head negative.  Tsh wnl.  Resp panel is negative.  UA Is negative. Blood cultures done and pending.  Suspect from dehydration. Continue with IV fluids overnight.  Repeat BMP in am.    Hypothyroidism:  Resume synthroid.    H/o renal transplant on prograf and prednisone Continue the same.  Repeat renal parameters in am.    Dementia:  Continue with Namenda.    Therapy evaluations ordered.        Estimated body mass index is 27.46 kg/m as calculated from the following:   Height as of this encounter: 5\' 3"  (1.6 m).   Weight as of this encounter: 70.3 kg.  Code Status: DNR DVT Prophylaxis:  enoxaparin (LOVENOX) injection 40 mg Start: 04/02/23 2200   Level of Care: Level of care: Telemetry Family Communication:None at bedside.   Disposition Plan:     Remains inpatient appropriate:  pending clinical improvement.    Procedures:  None.   Consultants:   none.   Antimicrobials:   Anti-infectives (From admission, onward)    None        Medications  Scheduled  Meds:  bethanechol  25 mg Oral TID   Chlorhexidine Gluconate Cloth  6 each Topical Daily   cholecalciferol  5,000 Units Oral Daily   dextrose  25 g Intravenous STAT   enoxaparin (LOVENOX) injection  40 mg Subcutaneous Q24H   fludrocortisone  0.2 mg Oral QHS   insulin aspart  0-9 Units Subcutaneous Q4H   levothyroxine  125 mcg Oral Q0600   memantine  10 mg Oral Q12H   multivitamin with minerals  1 tablet Oral Daily   predniSONE  5 mg Oral q morning   tacrolimus  0.5 mg Oral Daily   Continuous Infusions:  sodium chloride 40 mL/hr at 04/03/23 0538   PRN Meds:.acetaminophen    Subjective:   Emma Maldonado was seen and examined today. Pt requesting to eat. No complaints.   Objective:   Vitals:   04/02/23 2317 04/03/23 0245 04/03/23 0601 04/03/23 1000  BP: 120/65 (!) 119/104 135/60 110/71  Pulse: 75 62 65 81  Resp: 16 (!) 22 18  Temp:  97.8 F (36.6 C) 97.7 F (36.5 C) 98.3 F (36.8 C)  TempSrc:   Oral Oral  SpO2: 98% 95% 92% 97%  Weight:      Height:        Intake/Output Summary (Last 24 hours) at 04/03/2023 1207 Last data filed at 04/03/2023 1100 Gross per 24 hour  Intake 558 ml  Output 2100 ml  Net -1542 ml   Filed Weights   04/02/23 1500 04/02/23 1826  Weight: 60.3 kg 70.3 kg     Exam General exam: Appears calm and comfortable  Respiratory system: Clear to auscultation. Respiratory effort normal. Cardiovascular system: S1 & S2 heard, RRR.  Gastrointestinal system: Abdomen is nondistended, soft and nontender. Central nervous system: Alert and oriented to person and place Extremities: Symmetric 5 x 5 power. Skin: No rashes,  Psychiatry: Mood & affect appropriate.     Data Reviewed:  I have personally reviewed following labs and imaging studies   CBC Lab Results  Component Value Date   WBC 4.1 04/02/2023   RBC 4.10 04/02/2023   HGB 10.0 (L) 04/02/2023   HCT 32.9 (L) 04/02/2023   MCV 80.2 04/02/2023   MCH 24.4 (L) 04/02/2023   PLT 155 04/02/2023    MCHC 30.4 04/02/2023   RDW 17.0 (H) 04/02/2023   LYMPHSABS 1.3 04/02/2023   MONOABS 0.5 04/02/2023   EOSABS 0.1 04/02/2023   BASOSABS 0.0 04/02/2023     Last metabolic panel Lab Results  Component Value Date   NA 136 04/02/2023   K 4.2 04/02/2023   CL 107 04/02/2023   CO2 24 04/02/2023   BUN 32 (H) 04/02/2023   CREATININE 1.06 (H) 04/02/2023   GLUCOSE 94 04/02/2023   GFRNONAA 55 (L) 04/02/2023   GFRAA >90 08/14/2012   CALCIUM 8.5 (L) 04/02/2023   PHOS 4.2 08/28/2020   PROT 5.6 (L) 04/02/2023   ALBUMIN 2.3 (L) 04/02/2023   BILITOT 0.6 04/02/2023   ALKPHOS 47 04/02/2023   AST 22 04/02/2023   ALT 21 04/02/2023   ANIONGAP 5 04/02/2023    CBG (last 3)  Recent Labs    04/03/23 0740 04/03/23 0820 04/03/23 1108  GLUCAP 44* 120* 155*      Coagulation Profile: No results for input(s): "INR", "PROTIME" in the last 168 hours.   Radiology Studies: DG Chest Port 1 View Result Date: 04/02/2023 CLINICAL DATA:  Altered mental status.  Suspected sepsis. EXAM: PORTABLE CHEST 1 VIEW COMPARISON:  10/24/2022 FINDINGS: The heart size and mediastinal contours are within normal limits. Both lungs are clear. The visualized skeletal structures are unremarkable. IMPRESSION: No active disease. Electronically Signed   By: Danae Orleans M.D.   On: 04/02/2023 17:13   CT Head Wo Contrast Result Date: 04/02/2023 CLINICAL DATA:  Mental status change, unknown cause.  Lethargy. EXAM: CT HEAD WITHOUT CONTRAST TECHNIQUE: Contiguous axial images were obtained from the base of the skull through the vertex without intravenous contrast. RADIATION DOSE REDUCTION: This exam was performed according to the departmental dose-optimization program which includes automated exposure control, adjustment of the mA and/or kV according to patient size and/or use of iterative reconstruction technique. COMPARISON:  Head CT 03/15/2023 FINDINGS: Brain: There is no evidence of an acute infarct, intracranial hemorrhage,  mass, midline shift, or extra-axial fluid collection. Mild cerebral atrophy is within normal limits for age. Vascular: Calcified atherosclerosis at the skull base. No hyperdense vessel. Skull: No acute fracture or suspicious lesion. Sinuses/Orbits: Moderately extensive opacification of the ethmoid sinuses and left greater than  right sphenoid sinus fluid. Clear mastoid air cells. No acute finding in the included portions of the orbits. Other: None. IMPRESSION: 1. No evidence of acute intracranial abnormality. 2. Ethmoid and sphenoid sinus inflammation. Electronically Signed   By: Sebastian Ache M.D.   On: 04/02/2023 16:45       Kathlen Mody M.D. [facility] 04/03/2023, 12:07 PM  Available via Epic secure chat 7am-7pm After 7 pm, please refer to night coverage provider listed on amion.

## 2023-04-03 NOTE — Evaluation (Signed)
 Clinical/Bedside Swallow Evaluation Patient Details  Name: Rozlyn Yerby MRN: [id number] Date of Birth: 12-09-1950  Today's Date: 04/03/2023 Time: SLP Start Time (ACUTE ONLY): 0850 SLP Stop Time (ACUTE ONLY): 0905 SLP Time Calculation (min) (ACUTE ONLY): 15 min  Past Medical History:  Past Medical History:  Diagnosis Date   Dementia (HCC)    Diabetes mellitus without complication (HCC)    Hypertension    Renal disorder    Renal insufficiency    Past Surgical History:  Past Surgical History:  Procedure Laterality Date   ABDOMINAL HYSTERECTOMY     BIOPSY  12/28/2020   Procedure: BIOPSY;  Surgeon: Kathi Der, MD;  Location: [facility];  Service: Gastroenterology;;   CHOLECYSTECTOMY     FLEXIBLE SIGMOIDOSCOPY N/A 12/28/2020   Procedure: FLEXIBLE SIGMOIDOSCOPY;  Surgeon: Kathi Der, MD;  Location: [facility];  Service: Gastroenterology;  Laterality: N/A;   NEPHRECTOMY TRANSPLANTED ORGAN     HPI:  Patient is a 73 y.o. female with PMH: dementia (advanced per daughter report), recurrent UTI, DM-2, kidney transplant. She was recently treated for a UTI. She presented to the hospital from ALF secondary to increased lethargy for past few days and increased confusion. In ED, she was hemodynamically stable; on room air; sleepy and difficult to rouse. UA normal, electrolytes and white cell counts both normal. CT head negative for acute intracranial abnormality. CXR reported that both lungs clear. She was made NPO and SLP swallow evaluation ordered.    Assessment / Plan / Recommendation  Clinical Impression  Patient presents with clinical s/s of dysphagia with SLP suspecting a cognitively-based oral dysphagia. She was receptive to PO's of thin liquids (juice), applesauce, graham cracker. Swallow initiation appeared timely and no overt s/s aspiration during or after any PO intake. Patient with prolonged but complete mastication of graham cracker. SLP recommending initiate PO  diet of Dys 3 (mechanical soft), thin liquids and will follow for toleration. Of note, she did exhibit s/s of nasal congestion which she was unable to clear. SLP Visit Diagnosis: Dysphagia, unspecified (R13.10)    Aspiration Risk  Mild aspiration risk    Diet Recommendation Dysphagia 3 (Mech soft);Thin liquid    Liquid Administration via: Cup;Straw Medication Administration: Other (Comment) (as tolerated) Supervision: Full supervision/cueing for compensatory strategies Compensations: Slow rate;Small sips/bites Postural Changes: Seated upright at 90 degrees    Other  Recommendations Oral Care Recommendations: Oral care BID    Recommendations for follow up therapy are one component of a multi-disciplinary discharge planning process, led by the attending physician.  Recommendations may be updated based on patient status, additional functional criteria and insurance authorization.  Follow up Recommendations Other (comment) (TBD)      Assistance Recommended at Discharge    Functional Status Assessment Patient has had a recent decline in their functional status and demonstrates the ability to make significant improvements in function in a reasonable and predictable amount of time.  Frequency and Duration min 1 x/week  1 week       Prognosis Prognosis for improved oropharyngeal function: Good      Swallow Study   General Date of Onset: 04/02/23 HPI: Patient is a 73 y.o. female with PMH: dementia (advanced per daughter report), recurrent UTI, DM-2, kidney transplant. She was recently treated for a UTI. She presented to the hospital from ALF secondary to increased lethargy for past few days and increased confusion. In ED, she was hemodynamically stable; on room air; sleepy and difficult to rouse. UA normal, electrolytes and white cell counts  both normal. CT head negative for acute intracranial abnormality. CXR reported that both lungs clear. She was made NPO and SLP swallow evaluation  ordered. Type of Study: Bedside Swallow Evaluation Previous Swallow Assessment: BSE 2024 Diet Prior to this Study: NPO Temperature Spikes Noted: No Respiratory Status: Room air History of Recent Intubation: No Behavior/Cognition: Cooperative;Pleasant mood;Lethargic/Drowsy Oral Cavity Assessment: Within Functional Limits Oral Care Completed by SLP: No Oral Cavity - Dentition: Adequate natural dentition Self-Feeding Abilities: Total assist Baseline Vocal Quality: Normal Volitional Cough: Cognitively unable to elicit Volitional Swallow: Unable to elicit    Oral/Motor/Sensory Function Overall Oral Motor/Sensory Function: Within functional limits   Ice Chips     Thin Liquid Thin Liquid: Within functional limits Presentation: Straw    Nectar Thick     Honey Thick     Puree Puree: Within functional limits Presentation: Spoon   Solid     Solid: Impaired Oral Phase Impairments: Impaired mastication Oral Phase Functional Implications: Impaired mastication     Angela Nevin, MA, CCC-SLP Speech Therapy

## 2023-04-04 DIAGNOSIS — G9341 Metabolic encephalopathy: Secondary | ICD-10-CM | POA: Diagnosis not present

## 2023-04-04 DIAGNOSIS — E039 Hypothyroidism, unspecified: Secondary | ICD-10-CM | POA: Diagnosis not present

## 2023-04-04 DIAGNOSIS — F03918 Unspecified dementia, unspecified severity, with other behavioral disturbance: Secondary | ICD-10-CM | POA: Diagnosis not present

## 2023-04-04 DIAGNOSIS — E1165 Type 2 diabetes mellitus with hyperglycemia: Secondary | ICD-10-CM | POA: Diagnosis not present

## 2023-04-04 LAB — GLUCOSE, CAPILLARY
Glucose-Capillary: 142 mg/dL — ABNORMAL HIGH (ref 70–99)
Glucose-Capillary: 262 mg/dL — ABNORMAL HIGH (ref 70–99)
Glucose-Capillary: 288 mg/dL — ABNORMAL HIGH (ref 70–99)
Glucose-Capillary: 68 mg/dL — ABNORMAL LOW (ref 70–99)
Glucose-Capillary: 92 mg/dL (ref 70–99)

## 2023-04-04 LAB — BASIC METABOLIC PANEL
Anion gap: 8 (ref 5–15)
BUN: 9 mg/dL (ref 8–23)
CO2: 21 mmol/L — ABNORMAL LOW (ref 22–32)
Calcium: 8.2 mg/dL — ABNORMAL LOW (ref 8.9–10.3)
Chloride: 109 mmol/L (ref 98–111)
Creatinine, Ser: 0.62 mg/dL (ref 0.44–1.00)
GFR, Estimated: >60 mL/min (ref >60)
Glucose, Bld: 142 mg/dL — ABNORMAL HIGH (ref 70–99)
Potassium: 4 mmol/L (ref 3.5–5.1)
Sodium: 138 mmol/L (ref 135–145)

## 2023-04-04 MED ORDER — SODIUM CHLORIDE 0.9 % IV BOLUS
250.0000 mL | Freq: Once | INTRAVENOUS | Status: AC
Start: 2023-04-04 — End: 2023-04-04
  Administered 2023-04-04: 250 mL via INTRAVENOUS

## 2023-04-04 NOTE — Plan of Care (Signed)

## 2023-04-04 NOTE — Progress Notes (Signed)
 [facility]                                                                               Emma Maldonado, is a 73 y.o. female, DOB - 01-29-1950, NUU:[id number] Admit date - 04/02/2023    Outpatient Primary MD for the patient is Pcp, No  LOS - 1  days    Brief summary    Emma Maldonado is a 73 y.o. female with medical history significant of dementia, recurrent UTI, type 2 diabetes, kidney transplant on Prograf and prednisone who recently suffered from UTI and treated with 3 days of Rocephin 2 weeks ago sent to the hospital from ALF with being more lethargic for last few days. Foley catheter was exchanged in the ER.  Chest x-ray is normal.  CT head is nonacute. Due to patient's persistent lethargy, admission requested for monitoring.    Assessment & Plan    Assessment and Plan:   Acute metabolic encephalopathy in the setting of advanced dementia:  ? From dehydration. No focal deficits.  CT head negative.  Tsh wnl. UDS Is negative.  Resp panel is negative.  UA Is negative. Blood cultures done and pending.  Suspect from dehydration. Continue with IV fluids overnight.  Repeat BMP shows improvement in creatinine.  She remains confused today, probably delirious.    Diarrhea upto 6 bowel movements today.  Some loose and some formed. GI panel ordered.  C diff pcr ordered.    Hypothyroidism:  Resume synthroid.  Tsh wnl.    H/o renal transplant on prograf and prednisone Continue the same.  Creatinine improved with IV fluids.    Dementia:  Continue with Namenda.    Therapy evaluations ordered.        Estimated body mass index is 27.46 kg/m as calculated from the following:   Height as of this encounter: 5\' 3"  (1.6 m).   Weight as of this encounter: 70.3 kg.  Code Status: DNR DVT Prophylaxis:  enoxaparin (LOVENOX) injection 40 mg Start: 04/02/23 2200   Level of Care: Level of care: Telemetry Family Communication:None at bedside.    Disposition Plan:     Remains inpatient appropriate:  pending clinical improvement.    Procedures:  None.   Consultants:   none.   Antimicrobials:   Anti-infectives (From admission, onward)    None        Medications  Scheduled Meds:  bethanechol  25 mg Oral TID   Chlorhexidine Gluconate Cloth  6 each Topical Daily   cholecalciferol  5,000 Units Oral Daily   enoxaparin (LOVENOX) injection  40 mg Subcutaneous Q24H   fludrocortisone  0.2 mg Oral QHS   insulin aspart  0-9 Units Subcutaneous Q4H   levothyroxine  125 mcg Oral Q0600   memantine  10 mg Oral Q12H   multivitamin with minerals  1 tablet Oral Daily   predniSONE  5 mg Oral q morning   tacrolimus  0.5 mg Oral Daily   Continuous Infusions:   PRN Meds:.acetaminophen    Subjective:   [name] was seen and examined today. No chest pain or sob. Some abdominal discomfort.   Objective:   Vitals:   04/04/23 0209  04/04/23 0211 04/04/23 0432 04/04/23 1230  BP: (!) 107/47 109/62 (!) 116/55 139/73  Pulse: 72 73 68 79  Resp:   18 14  Temp:   (!) 97.5 F (36.4 C) (!) 97.4 F (36.3 C)  TempSrc:   Oral Oral  SpO2:   97% 95%  Weight:      Height:        Intake/Output Summary (Last 24 hours) at 04/04/2023 1536 Last data filed at 04/04/2023 1317 Gross per 24 hour  Intake 360 ml  Output 2100 ml  Net -1740 ml   Filed Weights   04/02/23 1500 04/02/23 1826  Weight: 60.3 kg 70.3 kg     Exam General exam: Appears calm and comfortable  Respiratory system: Clear to auscultation. Respiratory effort normal. Cardiovascular system: S1 & S2 heard, RRR. Gastrointestinal system: Abdomen is nondistended, soft and nontender.  Central nervous system: Alert and oriented to self only.  Extremities: Symmetric 5 x 5 power. Skin: No rashes,  Psychiatry: flat affect.     Data Reviewed:  I have personally reviewed following labs and imaging studies   CBC Lab Results  Component Value Date   WBC 4.1 04/02/2023    RBC 4.10 04/02/2023   HGB 10.0 (L) 04/02/2023   HCT 32.9 (L) 04/02/2023   MCV 80.2 04/02/2023   MCH 24.4 (L) 04/02/2023   PLT 155 04/02/2023   MCHC 30.4 04/02/2023   RDW 17.0 (H) 04/02/2023   LYMPHSABS 1.3 04/02/2023   MONOABS 0.5 04/02/2023   EOSABS 0.1 04/02/2023   BASOSABS 0.0 04/02/2023     Last metabolic panel Lab Results  Component Value Date   NA 138 04/04/2023   K 4.0 04/04/2023   CL 109 04/04/2023   CO2 21 (L) 04/04/2023   BUN 9 04/04/2023   CREATININE 0.62 04/04/2023   GLUCOSE 142 (H) 04/04/2023   GFRNONAA >60 04/04/2023   GFRAA >90 08/14/2012   CALCIUM 8.2 (L) 04/04/2023   PHOS 4.2 08/28/2020   PROT 5.6 (L) 04/02/2023   ALBUMIN 2.3 (L) 04/02/2023   BILITOT 0.6 04/02/2023   ALKPHOS 47 04/02/2023   AST 22 04/02/2023   ALT 21 04/02/2023   ANIONGAP 8 04/04/2023    CBG (last 3)  Recent Labs    04/04/23 0012 04/04/23 0436 04/04/23 1147  GLUCAP 92 68* 142*      Coagulation Profile: No results for input(s): "INR", "PROTIME" in the last 168 hours.   Radiology Studies: DG Chest Port 1 View Result Date: 04/02/2023 CLINICAL DATA:  Altered mental status.  Suspected sepsis. EXAM: PORTABLE CHEST 1 VIEW COMPARISON:  10/24/2022 FINDINGS: The heart size and mediastinal contours are within normal limits. Both lungs are clear. The visualized skeletal structures are unremarkable. IMPRESSION: No active disease. Electronically Signed   By: Danae Orleans M.D.   On: 04/02/2023 17:13   CT Head Wo Contrast Result Date: 04/02/2023 CLINICAL DATA:  Mental status change, unknown cause.  Lethargy. EXAM: CT HEAD WITHOUT CONTRAST TECHNIQUE: Contiguous axial images were obtained from the base of the skull through the vertex without intravenous contrast. RADIATION DOSE REDUCTION: This exam was performed according to the departmental dose-optimization program which includes automated exposure control, adjustment of the mA and/or kV according to patient size and/or use of iterative  reconstruction technique. COMPARISON:  Head CT 03/15/2023 FINDINGS: Brain: There is no evidence of an acute infarct, intracranial hemorrhage, mass, midline shift, or extra-axial fluid collection. Mild cerebral atrophy is within normal limits for age. Vascular: Calcified atherosclerosis at the skull  base. No hyperdense vessel. Skull: No acute fracture or suspicious lesion. Sinuses/Orbits: Moderately extensive opacification of the ethmoid sinuses and left greater than right sphenoid sinus fluid. Clear mastoid air cells. No acute finding in the included portions of the orbits. Other: None. IMPRESSION: 1. No evidence of acute intracranial abnormality. 2. Ethmoid and sphenoid sinus inflammation. Electronically Signed   By: Sebastian Ache M.D.   On: 04/02/2023 16:45       Kathlen Mody M.D. [facility] 04/04/2023, 3:36 PM  Available via Epic secure chat 7am-7pm After 7 pm, please refer to night coverage provider listed on amion.

## 2023-04-04 NOTE — Evaluation (Signed)
 Occupational Therapy Evaluation Patient Details Name: Emma Maldonado MRN: [id number] DOB: 1950/12/21 Today's Date: 04/04/2023   History of Present Illness   73 y.o. female with medical history significant of dementia, recurrent UTI, type 2 diabetes, kidney transplant on Prograf and prednisone who recently suffered from UTI and treated with 3 days of Rocephin 2 weeks ago sent to the hospital from ALF with being more lethargic for last few days. Foley catheter was exchanged in the ER.     Clinical Impressions Pt admitted with the above. Pt currently with functional limitations due to the deficits listed below (see OT Problem List).  Pt will benefit from acute skilled OT to increase their safety and independence with ADL and functional mobility for ADL to facilitate discharge.       If plan is discharge home, recommend the following:   A little help with walking and/or transfers;A little help with bathing/dressing/bathroom       Precautions/Restrictions   Precautions Precautions: Fall     Mobility Bed Mobility Overal bed mobility: Needs Assistance Bed Mobility: Supine to Sit, Sit to Supine     Supine to sit: Supervision Sit to supine: Supervision        Transfers Overall transfer level: Needs assistance Equipment used: Rolling walker (2 wheels) Transfers: Sit to/from Stand Sit to Stand: Min assist                      ADL either performed or assessed with clinical judgement   ADL Overall ADL's : Needs assistance/impaired Eating/Feeding: Set up;Sitting   Grooming: Minimal assistance;Sitting   Upper Body Bathing: Minimal assistance;Sitting   Lower Body Bathing: Minimal assistance;Sit to/from stand;Cueing for safety;Cueing for sequencing   Upper Body Dressing : Minimal assistance;Sitting   Lower Body Dressing: Minimal assistance;Sit to/from stand;Cueing for safety;Cueing for sequencing   Toilet Transfer: Minimal assistance;BSC/3in1;Rolling  walker (2 wheels)   Toileting- Clothing Manipulation and Hygiene: Moderate assistance;Sit to/from stand;Cueing for safety;Cueing for sequencing       Functional mobility during ADLs: Minimal assistance;Cueing for safety;Rolling walker (2 wheels)       Vision   Vision Assessment?: No apparent visual deficits            Pertinent Vitals/Pain Pain Assessment Pain Assessment: No/denies pain     Extremity/Trunk Assessment Upper Extremity Assessment Upper Extremity Assessment: Generalized weakness   Lower Extremity Assessment Lower Extremity Assessment: Overall WFL for tasks assessed       Communication Communication Communication: Other (comment) Factors Affecting Communication: Non - [place] speaking, interpreter not available (Speaks Spanish)   Cognition Arousal: Alert Behavior During Therapy: WFL for tasks assessed/performed, Flat affect                                 Following commands: Intact         OT Problem List: Decreased strength;Decreased activity tolerance   OT Treatment/Interventions: Self-care/ADL training;Patient/family education;DME and/or AE instruction      OT Goals(Current goals can be found in the care plan section)   Acute Rehab OT Goals Patient Stated Goal: did not state OT Goal Formulation: With patient Time For Goal Achievement: 04/18/23 Potential to Achieve Goals: Good ADL Goals Pt Will Perform Grooming: with supervision;standing Pt Will Perform Lower Body Dressing: with supervision;with mod assist Pt Will Transfer to Toilet: with supervision;regular height toilet;ambulating Pt Will Perform Toileting - Clothing Manipulation and hygiene: with supervision;sit to/from stand  OT Frequency:  Min 2X/week       AM-PAC OT "6 Clicks" Daily Activity     Outcome Measure Help from another person eating meals?: None Help from another person taking care of personal grooming?: A Little Help from another person toileting,  which includes using toliet, bedpan, or urinal?: A Little Help from another person bathing (including washing, rinsing, drying)?: A Little Help from another person to put on and taking off regular upper body clothing?: A Little Help from another person to put on and taking off regular lower body clothing?: A Little 6 Click Score: 19   End of Session Equipment Utilized During Treatment: Rolling walker (2 wheels) Nurse Communication: Mobility status  Activity Tolerance: Patient tolerated treatment well Patient left: in bed;with call bell/phone within reach;with bed alarm set  OT Visit Diagnosis: Unsteadiness on feet (R26.81);Muscle weakness (generalized) (M62.81)                Time: 1610-9604 OT Time Calculation (min): 20 min Charges:  OT General Charges $OT Visit: 1 Visit OT Evaluation $OT Eval Low Complexity: 1 Low    Randell Teare D 04/04/2023, 3:00 PM

## 2023-04-04 NOTE — Evaluation (Signed)
 Physical Therapy One Time Evaluation Patient Details Name: Emma Maldonado MRN: [id number] DOB: Aug 23, 1950 Today's Date: 04/04/2023  History of Present Illness  73 y.o. female with medical history significant of dementia, recurrent UTI, type 2 diabetes, kidney transplant on Prograf and prednisone who recently suffered from UTI and treated with 3 days of Rocephin 2 weeks ago sent to the hospital from ALF with being more lethargic for last few days. Foley catheter was exchanged in the ER.  Clinical Impression  Patient evaluated by Physical Therapy with no further acute PT needs identified. All education has been completed and the patient has no further questions. Pt ambulated 200 ft in hallway with RW.  Pt poor historian, Spanish speaking and from ALF per chart.  Pt appears at her likely mobility baseline, no physical assist required. See below for any follow-up Physical Therapy or equipment needs. PT is signing off. Thank you for this referral.         If plan is discharge home, recommend the following: Supervision due to cognitive status   Can travel by private vehicle        Equipment Recommendations Rolling walker (2 wheels) (utilized RW today, uncertain of pt's baseline)  Recommendations for Other Services       Functional Status Assessment Patient has not had a recent decline in their functional status     Precautions / Restrictions Precautions Precautions: Fall      Mobility  Bed Mobility Overal bed mobility: Needs Assistance Bed Mobility: Supine to Sit     Supine to sit: Supervision          Transfers Overall transfer level: Needs assistance Equipment used: Rolling walker (2 wheels) Transfers: Sit to/from Stand Sit to Stand: Contact guard assist           General transfer comment: visual cues for hand placement, no physical assist required    Ambulation/Gait Ambulation/Gait assistance: Contact guard assist Gait Distance (Feet): 200 Feet Assistive  device: Rolling walker (2 wheels) Gait Pattern/deviations: Step-through pattern, Decreased stride length Gait velocity: decr     General Gait Details: steady with RW, reports some fatigue but no pain reported  Stairs            Wheelchair Mobility     Tilt Bed    Modified Rankin (Stroke Patients Only)       Balance                                             Pertinent Vitals/Pain Pain Assessment Pain Assessment: PAINAD Breathing: normal Negative Vocalization: none Facial Expression: smiling or inexpressive Body Language: relaxed Consolability: no need to console PAINAD Score: 0    Home Living                          Prior Function                       Extremity/Trunk Assessment        Lower Extremity Assessment Lower Extremity Assessment: Overall WFL for tasks assessed       Communication   Communication Communication: Other (comment) Factors Affecting Communication: Non - English speaking, interpreter not available (Speaks Spanish)    Cognition Arousal: Alert Behavior During Therapy: WFL for tasks assessed/performed, Flat affect   PT - Cognitive impairments: History of cognitive  impairments                       PT - Cognition Comments: hx adv dementia Following commands: Intact       Cueing       General Comments      Exercises     Assessment/Plan    PT Assessment Patient does not need any further PT services  PT Problem List         PT Treatment Interventions      PT Goals (Current goals can be found in the Care Plan section)  Acute Rehab PT Goals PT Goal Formulation: All assessment and education complete, DC therapy    Frequency       Co-evaluation               AM-PAC PT "6 Clicks" Mobility  Outcome Measure Help needed turning from your back to your side while in a flat bed without using bedrails?: None Help needed moving from lying on your back to sitting on  the side of a flat bed without using bedrails?: A Little Help needed moving to and from a bed to a chair (including a wheelchair)?: A Little Help needed standing up from a chair using your arms (e.g., wheelchair or bedside chair)?: A Little Help needed to walk in hospital room?: A Little Help needed climbing 3-5 steps with a railing? : A Little 6 Click Score: 19    End of Session Equipment Utilized During Treatment: Gait belt Activity Tolerance: Patient tolerated treatment well Patient left: in bed;with call bell/phone within reach;with bed alarm set   PT Visit Diagnosis: Difficulty in walking, not elsewhere classified (R26.2)    Time: 1324-4010 PT Time Calculation (min) (ACUTE ONLY): 10 min   Charges:   PT Evaluation $PT Eval Low Complexity: 1 Low   PT General Charges $$ ACUTE PT VISIT: 1 Visit        Thomasene Mohair PT, DPT Physical Therapist Acute Rehabilitation Services Office: [phone]   Janan Halter [place] 04/04/2023, 12:55 PM

## 2023-04-05 DIAGNOSIS — F03918 Unspecified dementia, unspecified severity, with other behavioral disturbance: Secondary | ICD-10-CM | POA: Diagnosis not present

## 2023-04-05 DIAGNOSIS — E039 Hypothyroidism, unspecified: Secondary | ICD-10-CM | POA: Diagnosis not present

## 2023-04-05 DIAGNOSIS — G9341 Metabolic encephalopathy: Secondary | ICD-10-CM | POA: Diagnosis not present

## 2023-04-05 DIAGNOSIS — E1165 Type 2 diabetes mellitus with hyperglycemia: Secondary | ICD-10-CM | POA: Diagnosis not present

## 2023-04-05 LAB — CBC WITH DIFFERENTIAL/PLATELET
Abs Immature Granulocytes: 0.02 10*3/uL (ref 0.00–0.07)
Basophils Absolute: 0 10*3/uL (ref 0.0–0.1)
Basophils Relative: 1 %
Eosinophils Absolute: 0.2 10*3/uL (ref 0.0–0.5)
Eosinophils Relative: 3 %
HCT: 32.7 % — ABNORMAL LOW (ref 36.0–46.0)
Hemoglobin: 10 g/dL — ABNORMAL LOW (ref 12.0–15.0)
Immature Granulocytes: 0 %
Lymphocytes Relative: 40 %
Lymphs Abs: 2.2 10*3/uL (ref 0.7–4.0)
MCH: 24.1 pg — ABNORMAL LOW (ref 26.0–34.0)
MCHC: 30.6 g/dL (ref 30.0–36.0)
MCV: 78.8 fL — ABNORMAL LOW (ref 80.0–100.0)
Monocytes Absolute: 0.4 10*3/uL (ref 0.1–1.0)
Monocytes Relative: 8 %
Neutro Abs: 2.7 10*3/uL (ref 1.7–7.7)
Neutrophils Relative %: 48 %
Platelets: 168 10*3/uL (ref 150–400)
RBC: 4.15 MIL/uL (ref 3.87–5.11)
RDW: 16.5 % — ABNORMAL HIGH (ref 11.5–15.5)
WBC: 5.5 10*3/uL (ref 4.0–10.5)
nRBC: 0 % (ref 0.0–0.2)

## 2023-04-05 LAB — BASIC METABOLIC PANEL
Anion gap: 11 (ref 5–15)
BUN: 7 mg/dL — ABNORMAL LOW (ref 8–23)
CO2: 21 mmol/L — ABNORMAL LOW (ref 22–32)
Calcium: 8.5 mg/dL — ABNORMAL LOW (ref 8.9–10.3)
Chloride: 111 mmol/L (ref 98–111)
Creatinine, Ser: 0.67 mg/dL (ref 0.44–1.00)
GFR, Estimated: >60 mL/min (ref >60)
Glucose, Bld: 166 mg/dL — ABNORMAL HIGH (ref 70–99)
Potassium: 3.4 mmol/L — ABNORMAL LOW (ref 3.5–5.1)
Sodium: 143 mmol/L (ref 135–145)

## 2023-04-05 LAB — C DIFFICILE QUICK SCREEN W PCR REFLEX
C Diff antigen: NEGATIVE
C Diff interpretation: NOT DETECTED
C Diff toxin: NEGATIVE

## 2023-04-05 LAB — GLUCOSE, CAPILLARY
Glucose-Capillary: 104 mg/dL — ABNORMAL HIGH (ref 70–99)
Glucose-Capillary: 140 mg/dL — ABNORMAL HIGH (ref 70–99)
Glucose-Capillary: 156 mg/dL — ABNORMAL HIGH (ref 70–99)
Glucose-Capillary: 198 mg/dL — ABNORMAL HIGH (ref 70–99)
Glucose-Capillary: 212 mg/dL — ABNORMAL HIGH (ref 70–99)
Glucose-Capillary: 60 mg/dL — ABNORMAL LOW (ref 70–99)
Glucose-Capillary: 73 mg/dL (ref 70–99)
Glucose-Capillary: 85 mg/dL (ref 70–99)
Glucose-Capillary: 86 mg/dL (ref 70–99)
Glucose-Capillary: 90 mg/dL (ref 70–99)

## 2023-04-05 MED ORDER — HYDROXYZINE HCL 25 MG PO TABS
25.0000 mg | ORAL_TABLET | Freq: Three times a day (TID) | ORAL | Status: DC | PRN
  Administered 2023-04-05: 25 mg via ORAL
  Filled 2023-04-05: qty 1

## 2023-04-05 MED ORDER — POTASSIUM CHLORIDE CRYS ER 20 MEQ PO TBCR
40.0000 meq | EXTENDED_RELEASE_TABLET | Freq: Once | ORAL | Status: AC
  Administered 2023-04-05: 40 meq via ORAL
  Filled 2023-04-05: qty 2

## 2023-04-05 MED ORDER — GUAIFENESIN-DM 100-10 MG/5ML PO SYRP
5.0000 mL | ORAL_SOLUTION | ORAL | Status: DC | PRN
Start: 2023-04-05 — End: 2023-04-06
  Administered 2023-04-05: 5 mL via ORAL
  Filled 2023-04-05: qty 5

## 2023-04-05 MED ORDER — HALOPERIDOL LACTATE 5 MG/ML IJ SOLN
0.5000 mg | Freq: Four times a day (QID) | INTRAMUSCULAR | Status: DC | PRN
  Administered 2023-04-05: 0.5 mg via INTRAVENOUS
  Filled 2023-04-05: qty 1

## 2023-04-05 NOTE — Significant Event (Signed)
 Hypoglycemic Event  CBG: 60  Treatment: 8 oz juice  Symptoms: None  Follow-up CBG: Time:0514 CBG Result:86  Possible Reasons for Event: Inadequate meal intake  Comments/MD notified: Ted Mcalpine

## 2023-04-05 NOTE — NC FL2 (Signed)
 [place] MEDICAID FL2 LEVEL OF CARE FORM     IDENTIFICATION  Patient Name: Emma Maldonado Birthdate: [birth year] Sex: female Admission Date (Current Location): 04/02/2023  [facility] and [place] Number:  Producer, television/film/video and Address:  [facility],  [address], [place] [address]      Provider Number: [id number]  Attending Physician Name and Address:  Kathlen Mody, MD  Relative Name and Phone Number:  [name] (Daughter)  [phone]    Current Level of Care: Hospital Recommended Level of Care: Nursing Facility Prior Approval Number:    Date Approved/Denied:   PASRR Number:    Discharge Plan: SNF    Current Diagnoses: Patient Active Problem List   Diagnosis Date Noted   Acute metabolic encephalopathy 04/02/2023   Urinary retention 10/27/2022   Closed fracture of second lumbar vertebra (HCC) 10/24/2022   Fracture of L2 vertebra (HCC) 10/24/2022   Sepsis due to urinary tract infection (HCC) 02/23/2022   Acute lower UTI    Chronic diarrhea 01/05/2021   Pressure injury of skin 01/03/2021   Sepsis (HCC) 01/03/2021   Acute urinary retention 01/03/2021   Bacteremia    Severe sepsis (HCC) 12/23/2020   Hypothyroidism 12/23/2020   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   Hypomagnesemia    Hypotension 08/22/2020   Hydronephrosis of right kidney 05/01/2020   Rib fractures 05/01/2020   UTI (urinary tract infection) 03/25/2020   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   Hypertension 03/25/2020   Dementia with behavioral disturbance (HCC) 03/25/2020   Altered mental status 03/25/2020   Type 2 diabetes mellitus with hyperglycemia (HCC) 03/25/2020   Accelerated hypertension 03/28/2012   Viral gastroenteritis 03/28/2012   History of renal transplant 03/26/2012   Diabetes mellitus (HCC) 03/26/2012   Hyperlipidemia 03/26/2012    Orientation RESPIRATION BLADDER Height & Weight     Self  Normal Incontinent, External catheter Weight: 155 lb  (70.3 kg) Height:  5\' 3"  (160 cm)  BEHAVIORAL SYMPTOMS/MOOD NEUROLOGICAL BOWEL NUTRITION STATUS      Continent Diet (See discharge summary)  AMBULATORY STATUS COMMUNICATION OF NEEDS Skin   Limited Assist Verbally Normal                       Personal Care Assistance Level of Assistance  Bathing, Feeding, Dressing Bathing Assistance: Limited assistance Feeding assistance: Limited assistance Dressing Assistance: Limited assistance     Functional Limitations Info  Sight, Hearing, Speech Sight Info: Adequate Hearing Info: Adequate Speech Info: Adequate    SPECIAL CARE FACTORS FREQUENCY                       Contractures Contractures Info: Not present    Additional Factors Info  Code Status, Allergies Code Status Info: DNR Allergies Info: No Known Allergies           Current Medications (04/05/2023):  This is the current hospital active medication list Current Facility-Administered Medications  Medication Dose Route Frequency Provider Last Rate Last Admin   acetaminophen (TYLENOL) tablet 500 mg  500 mg Oral Q4H PRN Dorcas Carrow, MD       bethanechol (URECHOLINE) tablet 25 mg  25 mg Oral TID Dorcas Carrow, MD   25 mg at 04/04/23 2224   Chlorhexidine Gluconate Cloth 2 % PADS 6 each  6 each Topical Daily Kathlen Mody, MD   6 each at 04/05/23 0933   cholecalciferol (VITAMIN D3) 25 MCG (1000 UNIT) tablet 5,000 Units  5,000 Units Oral  Daily Dorcas Carrow, MD   5,000 Units at 04/04/23 1012   enoxaparin (LOVENOX) injection 40 mg  40 mg Subcutaneous Q24H Dorcas Carrow, MD   40 mg at 04/04/23 2224   fludrocortisone (FLORINEF) tablet 0.2 mg  0.2 mg Oral QHS Dorcas Carrow, MD   0.2 mg at 04/04/23 2224   guaiFENesin-dextromethorphan (ROBITUSSIN DM) 100-10 MG/5ML syrup 5 mL  5 mL Oral Q4H PRN Luiz Iron, NP   5 mL at 04/05/23 0147   insulin aspart (novoLOG) injection 0-9 Units  0-9 Units Subcutaneous Q4H Dorcas Carrow, MD   2 Units at 04/05/23 0130   levothyroxine  (SYNTHROID) tablet 125 mcg  125 mcg Oral Q0600 Dorcas Carrow, MD   125 mcg at 04/05/23 0513   memantine (NAMENDA) tablet 10 mg  10 mg Oral Q12H Dorcas Carrow, MD   10 mg at 04/04/23 2224   multivitamin with minerals tablet 1 tablet  1 tablet Oral Daily Dorcas Carrow, MD   1 tablet at 04/04/23 1014   predniSONE (DELTASONE) tablet 5 mg  5 mg Oral q morning Dorcas Carrow, MD   5 mg at 04/04/23 1013   tacrolimus (PROGRAF) capsule 0.5 mg  0.5 mg Oral Daily Dorcas Carrow, MD   0.5 mg at 04/04/23 1014     Discharge Medications: Please see discharge summary for a list of discharge medications.  Relevant Imaging Results:  Relevant Lab Results:   Additional Information SSN: [id number]  Otelia Santee, LCSW

## 2023-04-05 NOTE — Plan of Care (Signed)

## 2023-04-05 NOTE — Progress Notes (Signed)
 Mobility Specialist - Progress Note   04/05/23 1502  Mobility  Activity Ambulated with assistance in hallway  Level of Assistance Contact guard assist, steadying assist  Assistive Device Front wheel walker  Distance Ambulated (ft) 200 ft  Range of Motion/Exercises Active  Activity Response Tolerated well  Mobility Referral Yes  Mobility visit 1 Mobility  Mobility Specialist Start Time (ACUTE ONLY) 1450  Mobility Specialist Stop Time (ACUTE ONLY) 1502  Mobility Specialist Time Calculation (min) (ACUTE ONLY) 12 min   Pt was found in bed and agreeable to ambulate. No complaints with session. At EOS returned to bed with all needs met. Call bell in reach and bed alarm on.  Billey Chang Mobility Specialist

## 2023-04-05 NOTE — Progress Notes (Signed)
 Speech Language Pathology Treatment: Dysphagia  Patient Details Name: Emma Maldonado MRN: [id number] DOB: 02/06/1950 Today's Date: 04/05/2023 Time: 4782-9562 SLP Time Calculation (min) (ACUTE ONLY): 10 min  Assessment / Plan / Recommendation Clinical Impression  Patient seen by SLP for skilled treatment focused on dysphagia goals. When SLP first arrived, patient's son in room and patient was awake, alert but declining to have any PO's. Son told SLP that his sister is the family member who knows the most about patient, but he did deny her having any h/o difficulty with PO intake. SLP returned later as RN was giving her medications, which she swallowed without difficulty. RN reported that patient was able to feed herself this morning. While SLP in room, patient's lunch meal tray arrived. SLP provided setup assistance and patient then able to feed self. No overt s/s aspiration and SLP recommending advanced to regular solids, continue with thin liquids and no further skilled intervention warranted at this time.    HPI HPI: Patient is a 73 y.o. female with PMH: dementia (advanced per daughter report), recurrent UTI, DM-2, kidney transplant. She was recently treated for a UTI. She presented to the hospital from ALF secondary to increased lethargy for past few days and increased confusion. In ED, she was hemodynamically stable; on room air; sleepy and difficult to rouse. UA normal, electrolytes and white cell counts both normal. CT head negative for acute intracranial abnormality. CXR reported that both lungs clear. She was made NPO and SLP swallow evaluation ordered.      SLP Plan  Discharge SLP treatment due to (comment);All goals met      Recommendations for follow up therapy are one component of a multi-disciplinary discharge planning process, led by the attending physician.  Recommendations may be updated based on patient status, additional functional criteria and insurance authorization.     Recommendations  Diet recommendations: Regular;Thin liquid Liquids provided via: Straw;Cup Medication Administration: Whole meds with liquid Supervision: Patient able to self feed Compensations: Slow rate;Small sips/bites Postural Changes and/or Swallow Maneuvers: Seated upright 90 degrees                  Oral care BID   Set up Supervision/Assistance Dysphagia, unspecified (R13.10)     Discharge SLP treatment due to (comment);All goals met     Angela Nevin, MA, CCC-SLP Speech Therapy

## 2023-04-05 NOTE — TOC Initial Note (Signed)
 Transition of Care [facility]) - Initial/Assessment Note    Patient Details  Name: Emma Maldonado MRN: [id number] Date of Birth: [birth year]  Transition of Care [facility]) CM/SW Contact:    Otelia Santee, LCSW Phone Number: 04/05/2023, 10:45 AM  Clinical Narrative:                 Pt coming from [facility] in [place] where she is a LTC resident at [facility]. Confirmed pt is able to return at discharge. Pt only oriented x 1. Left VM for pt's daughter, [name]. Awaiting return call.   Expected Discharge Plan: Long Term Nursing Home Barriers to Discharge: No Barriers Identified   Patient Goals and CMS Choice Patient states their goals for this hospitalization and ongoing recovery are:: For pt to return to [facility]          Expected Discharge Plan and Services In-house Referral: Clinical Social Work Discharge Planning Services: NA Post Acute Care Choice: Resumption of Svcs/PTA Provider, Nursing Home Living arrangements for the past 2 months: Skilled Nursing Facility                 DME Arranged: N/A DME Agency: NA                  Prior Living Arrangements/Services Living arrangements for the past 2 months: Skilled Nursing Facility Lives with:: Facility Resident Patient language and need for interpreter reviewed:: Yes Do you feel safe going back to the place where you live?: Yes      Need for Family Participation in Patient Care: Yes (Comment) Care giver support system in place?: Yes (comment) Current home services: DME Criminal Activity/Legal Involvement Pertinent to Current Situation/Hospitalization: No - Comment as needed  Activities of Daily Living   ADL Screening (condition at time of admission) Independently performs ADLs?: No Does the patient have a NEW difficulty with bathing/dressing/toileting/self-feeding that is expected to last >3 days?: No Does the patient have a NEW difficulty with getting in/out of bed, walking, or climbing stairs that is expected to last  >3 days?: No Does the patient have a NEW difficulty with communication that is expected to last >3 days?: No Is the patient deaf or have difficulty hearing?: No Does the patient have difficulty seeing, even when wearing glasses/contacts?: No Does the patient have difficulty concentrating, remembering, or making decisions?: Yes  Permission Sought/Granted Permission sought to share information with : Facility Medical sales representative, Family Supports Permission granted to share information with : Yes, Verbal Permission Granted  Share Information with NAME: Small,Lillian  Permission granted to share info w AGENCY: [facility]  Permission granted to share info w Relationship: Daughter  Permission granted to share info w Contact Information: [phone]  Emotional Assessment   Attitude/Demeanor/Rapport: Unable to Assess Affect (typically observed): Unable to Assess Orientation: : Oriented to Self Alcohol / Substance Use: Not Applicable Psych Involvement: No (comment)  Admission diagnosis:  Altered mental status, unspecified altered mental status type [R41.82] Acute metabolic encephalopathy [G93.41] Patient Active Problem List   Diagnosis Date Noted   Acute metabolic encephalopathy 04/02/2023   Urinary retention 10/27/2022   Closed fracture of second lumbar vertebra (HCC) 10/24/2022   Fracture of L2 vertebra (HCC) 10/24/2022   Sepsis due to urinary tract infection (HCC) 02/23/2022   Acute lower UTI    Chronic diarrhea 01/05/2021   Pressure injury of skin 01/03/2021   Sepsis (HCC) 01/03/2021   Acute urinary retention 01/03/2021   Bacteremia    Severe sepsis (HCC) 12/23/2020   Hypothyroidism  12/23/2020   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   Hypomagnesemia    Hypotension 08/22/2020   Hydronephrosis of right kidney 05/01/2020   Rib fractures 05/01/2020   UTI (urinary tract infection) 03/25/2020   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 [place] [address] Phone: [phone] Fax: [phone]     Social Drivers of Health (SDOH) Social History: SDOH Screenings   Food Insecurity: No Food Insecurity (04/03/2023)  Housing: Low Risk  (04/03/2023)  Transportation Needs: No Transportation Needs (04/03/2023)  Utilities: Not At Risk (04/03/2023)  Social Connections: Patient Declined (04/03/2023)  Tobacco Use: Low Risk  (04/02/2023)   SDOH Interventions:     Readmission Risk Interventions    04/05/2023   10:42 AM 01/03/2021    2:24 PM 01/03/2021    2:16 PM  Readmission Risk Prevention Plan  Post Dischage Appt Complete    Medication Screening Complete    Transportation Screening Complete  Complete  HRI or Home Care Consult  Complete   Social Work Consult for Recovery Care Planning/Counseling  Complete   Palliative Care Screening  Not Applicable   Medication Review Oceanographer)   Complete

## 2023-04-05 NOTE — Plan of Care (Signed)
  Problem: Nutrition: Goal: Adequate nutrition will be maintained Outcome: Progressing   Problem: Safety: Goal: Ability to remain free from injury will improve Outcome: Progressing   

## 2023-04-05 NOTE — Plan of Care (Addendum)
 VSS. No c/o pain. Patient hypoglycemic, BG 60 this morning. Patient given 8 oz of juice with improvement (provider aware). No BM this shift. Foley remains in place draining yellow urine.   Problem: Metabolic: Goal: Ability to maintain appropriate glucose levels will improve Outcome: Progressing   Problem: Nutritional: Goal: Maintenance of adequate nutrition will improve Outcome: Progressing   Problem: Clinical Measurements: Goal: Ability to maintain clinical measurements within normal limits will improve Outcome: Progressing Goal: Will remain free from infection Outcome: Progressing Goal: Respiratory complications will improve Outcome: Progressing   Problem: Activity: Goal: Risk for activity intolerance will decrease Outcome: Progressing   Problem: Nutrition: Goal: Adequate nutrition will be maintained Outcome: Progressing   Problem: Safety: Goal: Ability to remain free from injury will improve Outcome: Progressing

## 2023-04-05 NOTE — Progress Notes (Signed)
 [facility]                                                                               Emma Maldonado, is a 73 y.o. female, DOB - [birth year], WUJ:[id number] Admit date - 04/02/2023    Outpatient Primary MD for the patient is Pcp, No  LOS - 2  days    Brief summary    Emma Maldonado is a 73 y.o. female with medical history significant of dementia, recurrent UTI, type 2 diabetes, kidney transplant on Prograf and prednisone who recently suffered from UTI and treated with 3 days of Rocephin 2 weeks ago sent to the hospital from ALF with being more lethargic for last few days. Foley catheter was exchanged in the ER.  Chest x-ray is normal.  CT head is nonacute. Due to patient's persistent lethargy, admission requested for monitoring.    Assessment & Plan    Assessment and Plan:   Acute metabolic encephalopathy in the setting of advanced dementia:  ? From dehydration. No focal deficits.  CT head negative.  Tsh wnl. UDS Is negative.  Resp panel is negative.  UA Is negative. Blood cultures done and pending. Negative so far.  Repeat BMP shows improvement in creatinine.  Slp eval    Diarrhea upto 6 bowel movements today.  Some loose and some formed. GI panel ordered.  C diff is negative. GI panel is pending.    Hypothyroidism:  Resume synthroid.  Tsh wnl.    H/o renal transplant on prograf and prednisone Continue the same.  Creatinine improved with IV fluids.    Dementia:  Continue with Namenda.    Therapy evaluations ordered.     Hypokalemia Replaced. Repeat in am.    Estimated body mass index is 27.46 kg/m as calculated from the following:   Height as of this encounter: 5\' 3"  (1.6 m).   Weight as of this encounter: 70.3 kg.  Code Status: DNR DVT Prophylaxis:  enoxaparin (LOVENOX) injection 40 mg Start: 04/02/23 2200   Level of Care: Level of care: Telemetry Family Communication:None at bedside.   Disposition Plan:     Remains  inpatient appropriate:  pending clinical improvement.    Procedures:  None.   Consultants:   none.   Antimicrobials:   Anti-infectives (From admission, onward)    None        Medications  Scheduled Meds:  bethanechol  25 mg Oral TID   Chlorhexidine Gluconate Cloth  6 each Topical Daily   cholecalciferol  5,000 Units Oral Daily   enoxaparin (LOVENOX) injection  40 mg Subcutaneous Q24H   fludrocortisone  0.2 mg Oral QHS   insulin aspart  0-9 Units Subcutaneous Q4H   levothyroxine  125 mcg Oral Q0600   memantine  10 mg Oral Q12H   multivitamin with minerals  1 tablet Oral Daily   potassium chloride  40 mEq Oral Once   predniSONE  5 mg Oral q morning   tacrolimus  0.5 mg Oral Daily   Continuous Infusions:   PRN Meds:.acetaminophen, guaiFENesin-dextromethorphan    Subjective:   Emma Maldonado was seen and examined today. Some diarrhea and loose stools yesterday.  Objective:  Vitals:   04/04/23 1914 04/04/23 2230 04/05/23 0437 04/05/23 1205  BP: (!) 182/73 (!) 164/65 (!) 151/64 (!) 157/71  Pulse: 79 79 64 69  Resp: 16  16   Temp: 98.5 F (36.9 C)  98.7 F (37.1 C) 98.3 F (36.8 C)  TempSrc: Oral  Oral Oral  SpO2: 97% 95% 98% 99%  Weight:      Height:        Intake/Output Summary (Last 24 hours) at 04/05/2023 1353 Last data filed at 04/05/2023 1300 Gross per 24 hour  Intake 480 ml  Output 3030 ml  Net -2550 ml   Filed Weights   04/02/23 1500 04/02/23 1826  Weight: 60.3 kg 70.3 kg     Exam General exam: Appears calm and comfortable  Respiratory system: Clear to auscultation. Respiratory effort normal. Cardiovascular system: S1 & S2 heard, RRR. No JVD, Gastrointestinal system: Abdomen is nondistended, soft and nontender.  Central nervous system: Alert and oriented to place and person.  Extremities: Symmetric 5 x 5 power. Skin: No rashes, Psychiatry: flat affect.     Data Reviewed:  I have personally reviewed following labs and imaging  studies   CBC Lab Results  Component Value Date   WBC 5.5 04/05/2023   RBC 4.15 04/05/2023   HGB 10.0 (L) 04/05/2023   HCT 32.7 (L) 04/05/2023   MCV 78.8 (L) 04/05/2023   MCH 24.1 (L) 04/05/2023   PLT 168 04/05/2023   MCHC 30.6 04/05/2023   RDW 16.5 (H) 04/05/2023   LYMPHSABS 2.2 04/05/2023   MONOABS 0.4 04/05/2023   EOSABS 0.2 04/05/2023   BASOSABS 0.0 04/05/2023     Last metabolic panel Lab Results  Component Value Date   NA 143 04/05/2023   K 3.4 (L) 04/05/2023   CL 111 04/05/2023   CO2 21 (L) 04/05/2023   BUN 7 (L) 04/05/2023   CREATININE 0.67 04/05/2023   GLUCOSE 166 (H) 04/05/2023   GFRNONAA >60 04/05/2023   GFRAA >90 08/14/2012   CALCIUM 8.5 (L) 04/05/2023   PHOS 4.2 08/28/2020   PROT 5.6 (L) 04/02/2023   ALBUMIN 2.3 (L) 04/02/2023   BILITOT 0.6 04/02/2023   ALKPHOS 47 04/02/2023   AST 22 04/02/2023   ALT 21 04/02/2023   ANIONGAP 11 04/05/2023    CBG (last 3)  Recent Labs    04/05/23 0514 04/05/23 0724 04/05/23 1203  GLUCAP 86 90 198*      Coagulation Profile: No results for input(s): "INR", "PROTIME" in the last 168 hours.   Radiology Studies: No results found.      Kathlen Mody M.D. [facility] 04/05/2023, 1:53 PM  Available via Epic secure chat 7am-7pm After 7 pm, please refer to night coverage provider listed on amion.

## 2023-04-06 DIAGNOSIS — E1165 Type 2 diabetes mellitus with hyperglycemia: Secondary | ICD-10-CM | POA: Diagnosis not present

## 2023-04-06 DIAGNOSIS — E039 Hypothyroidism, unspecified: Secondary | ICD-10-CM | POA: Diagnosis not present

## 2023-04-06 DIAGNOSIS — F03918 Unspecified dementia, unspecified severity, with other behavioral disturbance: Secondary | ICD-10-CM | POA: Diagnosis not present

## 2023-04-06 DIAGNOSIS — I10 Essential (primary) hypertension: Secondary | ICD-10-CM | POA: Diagnosis not present

## 2023-04-06 LAB — BASIC METABOLIC PANEL
Anion gap: 8 (ref 5–15)
BUN: 6 mg/dL — ABNORMAL LOW (ref 8–23)
CO2: 22 mmol/L (ref 22–32)
Calcium: 8.6 mg/dL — ABNORMAL LOW (ref 8.9–10.3)
Chloride: 115 mmol/L — ABNORMAL HIGH (ref 98–111)
Creatinine, Ser: 0.69 mg/dL (ref 0.44–1.00)
GFR, Estimated: >60 mL/min (ref >60)
Glucose, Bld: 125 mg/dL — ABNORMAL HIGH (ref 70–99)
Potassium: 3.7 mmol/L (ref 3.5–5.1)
Sodium: 145 mmol/L (ref 135–145)

## 2023-04-06 LAB — GASTROINTESTINAL PANEL BY PCR, STOOL (REPLACES STOOL CULTURE)

## 2023-04-06 LAB — GLUCOSE, CAPILLARY
Glucose-Capillary: 68 mg/dL — ABNORMAL LOW (ref 70–99)
Glucose-Capillary: 72 mg/dL (ref 70–99)
Glucose-Capillary: 88 mg/dL (ref 70–99)
Glucose-Capillary: 117 mg/dL — ABNORMAL HIGH (ref 70–99)
Glucose-Capillary: 183 mg/dL — ABNORMAL HIGH (ref 70–99)
Glucose-Capillary: 142 mg/dL — ABNORMAL HIGH (ref 70–99)

## 2023-04-06 LAB — HEMOGLOBIN A1C
Hgb A1c MFr Bld: 7.9 % — ABNORMAL HIGH (ref 4.8–5.6)
Mean Plasma Glucose: 180.03 mg/dL

## 2023-04-06 MED ORDER — HYDROXYZINE HCL 25 MG PO TABS: 25.0000 mg | ORAL_TABLET | Freq: Three times a day (TID) | ORAL | 0 refills | Status: AC | PRN

## 2023-04-06 NOTE — Progress Notes (Signed)
 Report called to Leotis Shames, RN at the facility. All of her questions and concerns were addressed to her satisfaction. Patient pending PTAR for transport.

## 2023-04-06 NOTE — TOC Transition Note (Signed)
 Transition of Care [facility]) - Discharge Note   Patient Details  Name: Emma Maldonado MRN: [id number] Date of Birth: 1950/08/09  Transition of Care [facility]) CM/SW Contact:  Otelia Santee, LCSW Phone Number: 04/06/2023, 2:33 PM   Clinical Narrative:    Pt to return to [facility] [place] room 304b. RN to call report to [phone] ext [phone]. DC packet with signed DNR placed at RN station. Pt's daughter aware and agreeable to DC plans. PTAR called at 2:25pm for transportation.    Final next level of care: Long Term Nursing Home Barriers to Discharge: No Barriers Identified   Patient Goals and CMS Choice Patient states their goals for this hospitalization and ongoing recovery are:: For pt to return to [facility] CMS Medicare.gov Compare Post Acute Care list provided to:: Patient Represenative (must comment) Choice offered to / list presented to : [facility] POA / Guardian [place] ownership interest in [facility].provided to::  (NA)    Discharge Placement                Patient to be transferred to facility by: PTAR Name of family member notified: Daughter Patient and family notified of of transfer: 04/06/23  Discharge Plan and Services Additional resources added to the After Visit Summary for   In-house Referral: Clinical Social Work Discharge Planning Services: NA Post Acute Care Choice: Resumption of Svcs/PTA Provider, Nursing Home          DME Arranged: N/A DME Agency: NA                  Social Drivers of Health (SDOH) Interventions SDOH Screenings   Food Insecurity: No Food Insecurity (04/03/2023)  Housing: Low Risk  (04/03/2023)  Transportation Needs: No Transportation Needs (04/03/2023)  Utilities: Not At Risk (04/03/2023)  Social Connections: Patient Declined (04/03/2023)  Tobacco Use: Low Risk  (04/02/2023)     Readmission Risk Interventions    04/06/2023    2:31 PM 04/05/2023   10:42 AM 01/03/2021    2:24 PM  Readmission Risk Prevention Plan  Post  Dischage Appt  Complete   Medication Screening  Complete   Transportation Screening Complete Complete   PCP or Specialist Appt within 5-7 Days Complete    Home Care Screening Complete    Medication Review (RN CM) Complete    HRI or Home Care Consult   Complete  Social Work Consult for Recovery Care Planning/Counseling   Complete  Palliative Care Screening   Not Applicable

## 2023-04-06 NOTE — Progress Notes (Signed)
 Occupational Therapy Treatment Patient Details Name: Emma Maldonado MRN: [id number] DOB: [birth year] Today's Date: 04/06/2023   History of present illness 73 y.o. female with medical history significant of dementia, recurrent UTI, type 2 diabetes, kidney transplant on Prograf and prednisone who recently suffered from UTI and treated with 3 days of Rocephin 2 weeks ago sent to the hospital from ALF with being more lethargic for last few days. Foley catheter was exchanged in the ER.   OT comments  Patient making steady gains in skilled OT. Patient now on regular diet with thin liquids and able to self feed with set up this session and tolerate OOB to recliner for BADL routine. Patient continues to present with mild genralized weakness, decreased overall cognition./safety, activity tolerance and higher level balance impacting transfers, mobility and ADL's. Pt requires ongoing OT within the Acute setting to maximize safety and function prior to discharging back to LTC residence with skilled OT assessment if able.       If plan is discharge home, recommend the following:  A little help with walking and/or transfers;A little help with bathing/dressing/bathroom   Equipment Recommendations  None recommended by OT       Precautions / Restrictions Precautions Precautions: Fall Restrictions Weight Bearing Restrictions Per Provider Order: No       Mobility Bed Mobility Overal bed mobility: Needs Assistance Bed Mobility: Supine to Sit, Sit to Supine     Supine to sit: Supervision Sit to supine: Supervision        Transfers Overall transfer level: Needs assistance Equipment used: Rolling walker (2 wheels) Transfers: Sit to/from Stand, Bed to chair/wheelchair/BSC Sit to Stand: Contact guard assist (cues)           General transfer comment: visual cues for hand placement, no physical assist required     Balance Overall balance assessment: Mild deficits observed, not formally  tested                                         ADL either performed or assessed with clinical judgement   ADL Overall ADL's : Needs assistance/impaired Eating/Feeding: Set up;Sitting   Grooming: Wash/dry hands;Wash/dry face;Oral care;Brushing hair;Supervision/safety;Sitting   Upper Body Bathing: Contact guard assist;Sitting   Lower Body Bathing: Minimal assistance;Sitting/lateral leans;Sit to/from stand   Upper Body Dressing : Contact guard assist;Sitting   Lower Body Dressing: Contact guard assist;Minimal assistance;Sit to/from stand;Sitting/lateral leans   Toilet Transfer: Contact guard assist;Rolling walker (2 wheels);BSC/3in1   Toileting- Clothing Manipulation and Hygiene: Minimal assistance (has Foley for voiding)       Functional mobility during ADLs: Contact guard assist;Rolling walker (2 wheels) General ADL Comments: cues for sequencing and safety    Extremity/Trunk Assessment Upper Extremity Assessment Upper Extremity Assessment: Generalized weakness   Lower Extremity Assessment Lower Extremity Assessment: Overall WFL for tasks assessed        Vision   Vision Assessment?: No apparent visual deficits         Communication Communication Communication: Other (comment) Factors Affecting Communication: Non - English speaking, interpreter not available (able to understand fluently and express needs in English this visit)   Cognition Arousal: Alert Behavior During Therapy: [facility] for tasks assessed/performed, Flat affect                                 Following commands:  Intact        Cueing   Cueing Techniques: Verbal cues        General Comments no issues with skin    Pertinent Vitals/ Pain       Pain Assessment Pain Assessment: No/denies pain   Frequency  Min 2X/week        Progress Toward Goals  OT Goals(current goals can now be found in the care plan section)  Progress towards OT goals: Progressing toward  goals  Acute Rehab OT Goals Patient Stated Goal: to go home OT Goal Formulation: With patient Time For Goal Achievement: 04/18/23 Potential to Achieve Goals: Good ADL Goals Pt Will Perform Grooming: with supervision;standing Pt Will Perform Lower Body Dressing: with supervision;with mod assist Pt Will Transfer to Toilet: with supervision;regular height toilet;ambulating Pt Will Perform Toileting - Clothing Manipulation and hygiene: with supervision;sit to/from stand  Plan         AM-PAC OT "6 Clicks" Daily Activity     Outcome Measure   Help from another person eating meals?: None Help from another person taking care of personal grooming?: A Little Help from another person toileting, which includes using toliet, bedpan, or urinal?: A Little Help from another person bathing (including washing, rinsing, drying)?: A Little Help from another person to put on and taking off regular upper body clothing?: A Little Help from another person to put on and taking off regular lower body clothing?: A Little 6 Click Score: 19    End of Session Equipment Utilized During Treatment: Rolling walker (2 wheels);Gait belt  OT Visit Diagnosis: Unsteadiness on feet (R26.81);Muscle weakness (generalized) (M62.81)   Activity Tolerance Patient tolerated treatment well   Patient Left in chair;with chair alarm set;with call bell/phone within reach   Nurse Communication Mobility status        Time: 2841-3244 OT Time Calculation (min): 30 min  Charges: OT General Charges $OT Visit: 1 Visit OT Treatments $Self Care/Home Management : 23-37 mins  Emma Maldonado OT/L Acute Rehabilitation Department  [phone]  04/06/2023, 10:39 AM

## 2023-04-06 NOTE — TOC Progression Note (Addendum)
 Transition of Care [facility]) - Progression Note    Patient Details  Name: Emma Maldonado MRN: [id number] Date of Birth: 1951/01/09  Transition [facility]) CM/SW Contact  Otelia Santee, LCSW Phone Number: 04/06/2023, 10:24 AM  Clinical Narrative:    Left another voicemail for pt's daughter requesting a return call.   ADDENDUM: Spoke with pt's daughter anf confirmed she is pt's legal guardian. Received LG paperwork from daughter via email. Paperwork has been placed on pt's shadow chart.   Expected Discharge Plan: Long Term Nursing Home Barriers to Discharge: No Barriers Identified  Expected Discharge Plan and Services In-house Referral: Clinical Social Work Discharge Planning Services: NA Post Acute Care Choice: Resumption of Svcs/PTA Provider, Nursing Home Living arrangements for the past 2 months: Skilled Nursing Facility                 DME Arranged: N/A DME Agency: NA                   Social Determinants of Health (SDOH) Interventions SDOH Screenings   Food Insecurity: No Food Insecurity (04/03/2023)  Housing: Low Risk  (04/03/2023)  Transportation Needs: No Transportation Needs (04/03/2023)  Utilities: Not At Risk (04/03/2023)  Social Connections: Patient Declined (04/03/2023)  Tobacco Use: Low Risk  (04/02/2023)    Readmission Risk Interventions    04/05/2023   10:42 AM 01/03/2021    2:24 PM 01/03/2021    2:16 PM  Readmission Risk Prevention Plan  Post Dischage Appt Complete    Medication Screening Complete    Transportation Screening Complete  Complete  HRI or Home Care Consult  Complete   Social Work Consult for Recovery Care Planning/Counseling  Complete   Palliative Care Screening  Not Applicable   Medication Review Oceanographer)   Complete

## 2023-04-06 NOTE — Discharge Summary (Signed)
 Physician Discharge Summary   Patient: Emma Maldonado MRN: [id number] DOB: 11/10/1950  Admit date:     04/02/2023  Discharge date: 04/06/23  Discharge Physician: Kathlen Mody   PCP: Pcp, No   Recommendations at discharge:  Please follow up with PCP in one week.  We have held the [place] for hypoglycemia episodes, restart at a lower dose after discussing with PCP.   Discharge Diagnoses: Active Problems:   History of renal transplant   Diabetes mellitus (HCC)   Hypertension   Dementia with behavioral disturbance (HCC)   Hypothyroidism   Urinary retention   Acute metabolic encephalopathy  Resolved Problems:   * No resolved hospital problems. [facility] Course: No notes on file  Assessment and Plan:  Acute metabolic encephalopathy in the setting of advanced dementia:  ? From dehydration. No focal deficits.  CT head negative. Patient appears to be back to baseline.  Tsh wnl. UDS Is negative.  Resp panel is negative.  UA Is negative. Blood cultures done and pending. Negative so far.  Repeat BMP shows improvement in creatinine.  Slp eval recommended regular diet with thin liquids.   foley was replaced in ER.    Diarrhea  Improved.  C diff is negative. GI panel is negative.       Hypothyroidism:  Resume synthroid.  Tsh wnl.      H/o renal transplant on prograf and prednisone Continue the same.  Creatinine improved with IV fluids.      Dementia:  Continue with Namenda.      Therapy evaluations ordered.      Hypokalemia Replaced.    Insulin dependent DM;  CBG (last 3)  Recent Labs    04/06/23 0526 04/06/23 0734 04/06/23 1213  GLUCAP 88 117* 183*   Continue with SSI.Holding [place] on discharge.  Get A1c and follow up.   Anemia of chronic disease Hemoglobin stable around 10.         Consultants: none.  Procedures performed: none.   Disposition: Skilled nursing facility Diet recommendation:  Discharge Diet Orders (From admission,  onward)     Start     Ordered   04/06/23 0000  Diet - low sodium heart healthy        04/06/23 1342           Carb modified diet DISCHARGE MEDICATION: Allergies as of 04/06/2023   No Known Allergies      Medication List     PAUSE taking these medications    Tresiba FlexTouch 100 UNIT/ML FlexTouch Pen Wait to take this until your doctor or other care provider tells you to start again. Generic drug: insulin degludec Inject 45 Units into the skin in the morning.       STOP taking these medications    insulin glargine 100 UNIT/ML injection Commonly known as: Lantus   levofloxacin 750 MG tablet Commonly known as: LEVAQUIN       TAKE these medications    acetaminophen 500 MG tablet Commonly known as: TYLENOL Take 500 mg by mouth every 4 (four) hours as needed for fever (Temp > 100.5).   bethanechol 25 MG tablet Commonly known as: URECHOLINE Take 25 mg by mouth 3 (three) times daily.   cholestyramine 4 g packet Commonly known as: QUESTRAN Take 4 g by mouth See admin instructions. Mix 4 grams as directed and drink by mouth at 6 AM and 4:30 PM   Debrox 6.5 % OTIC solution Generic drug: carbamide peroxide Place 5 drops into both  ears every Monday, Wednesday, and Friday at 8 PM.   feeding supplement (PRO-STAT SUGAR FREE 64) Liqd Take 30 mLs by mouth in the morning and at bedtime.   fludrocortisone 0.1 MG tablet Commonly known as: FLORINEF Take 0.2 mg by mouth at bedtime.   HumaLOG 100 UNIT/ML injection Generic drug: insulin lispro Inject 0-10 Units into the skin See admin instructions. Inject 0-10 units into the skin three times a day before meals AND at bedtime, per sliding scale: BGL 0-149 = give nothing; 150-200 = 2 units; 201-250 = 4 units; 251-300 = 6 units; 301-350 = 8 units; 351-400 = 10 units; 400-1,000 = 10 units and CALL MD!   hydrOXYzine 25 MG tablet Commonly known as: ATARAX Take 1 tablet (25 mg total) by mouth 3 (three) times daily as needed  for anxiety.   ipratropium-albuterol 0.5-2.5 (3) MG/3ML Soln Commonly known as: DUONEB Take 3 mLs by nebulization every 8 (eight) hours.   levothyroxine 150 MCG tablet Commonly known as: SYNTHROID Take 150 mcg by mouth daily before breakfast.   loperamide 2 MG capsule Commonly known as: IMODIUM Take 2 mg by mouth every 4 (four) hours as needed (for diarrhea).   losartan 50 MG tablet Commonly known as: COZAAR Take 50 mg by mouth daily. What changed: Another medication with the same name was removed. Continue taking this medication, and follow the directions you see here.   magnesium hydroxide 400 MG/5ML suspension Commonly known as: MILK OF MAGNESIA Take 30 mLs by mouth daily as needed (for no B/M after 3 days).   memantine 10 MG tablet Commonly known as: NAMENDA Take 10 mg by mouth every 12 (twelve) hours.   multivitamin with minerals Tabs tablet Take 1 tablet by mouth daily.   potassium chloride SA 20 MEQ tablet Commonly known as: KLOR-CON M Take 20 mEq by mouth 2 (two) times daily.   predniSONE 5 MG tablet Commonly known as: DELTASONE Take 5 mg by mouth daily with breakfast.   rosuvastatin 5 MG tablet Commonly known as: CRESTOR Take 5 mg by mouth at bedtime.   tacrolimus 0.5 MG capsule Commonly known as: PROGRAF Take 1 capsule (0.5 mg total) by mouth daily.   Vitamin D3 125 MCG (5000 UT) Tabs Take 5,000 Units by mouth daily.        Discharge Exam: Filed Weights   04/02/23 1500 04/02/23 1826  Weight: 60.3 kg 70.3 kg   General exam: Appears calm and comfortable  Respiratory system: Clear to auscultation. Respiratory effort normal. Cardiovascular system: S1 & S2 heard, RRR.  Gastrointestinal system: Abdomen is nondistended, soft and nontender. Central nervous system: Alert and oriented.  Extremities: Symmetric 5 x 5 power. Skin: No rashes, lesions or ulcers Psychiatry: Judgement and insight appear normal. Mood & affect appropriate.    Condition at  discharge: fair  The results of significant diagnostics from this hospitalization (including imaging, microbiology, ancillary and laboratory) are listed below for reference.   Imaging Studies: DG Chest Port 1 View Result Date: 04/02/2023 CLINICAL DATA:  Altered mental status.  Suspected sepsis. EXAM: PORTABLE CHEST 1 VIEW COMPARISON:  10/24/2022 FINDINGS: The heart size and mediastinal contours are within normal limits. Both lungs are clear. The visualized skeletal structures are unremarkable. IMPRESSION: No active disease. Electronically Signed   By: Danae Orleans M.D.   On: 04/02/2023 17:13   CT Head Wo Contrast Result Date: 04/02/2023 CLINICAL DATA:  Mental status change, unknown cause.  Lethargy. EXAM: CT HEAD WITHOUT CONTRAST TECHNIQUE: Contiguous axial images were  obtained from the base of the skull through the vertex without intravenous contrast. RADIATION DOSE REDUCTION: This exam was performed according to the departmental dose-optimization program which includes automated exposure control, adjustment of the mA and/or kV according to patient size and/or use of iterative reconstruction technique. COMPARISON:  Head CT 03/15/2023 FINDINGS: Brain: There is no evidence of an acute infarct, intracranial hemorrhage, mass, midline shift, or extra-axial fluid collection. Mild cerebral atrophy is within normal limits for age. Vascular: Calcified atherosclerosis at the skull base. No hyperdense vessel. Skull: No acute fracture or suspicious lesion. Sinuses/Orbits: Moderately extensive opacification of the ethmoid sinuses and left greater than right sphenoid sinus fluid. Clear mastoid air cells. No acute finding in the included portions of the orbits. Other: None. IMPRESSION: 1. No evidence of acute intracranial abnormality. 2. Ethmoid and sphenoid sinus inflammation. Electronically Signed   By: Sebastian Ache M.D.   On: 04/02/2023 16:45    Microbiology: Results for orders placed or performed during the  hospital encounter of 04/02/23  Blood Culture (routine x 2)     Status: None (Preliminary result)   Collection Time: 04/02/23  3:34 PM   Specimen: BLOOD  Result Value Ref Range Status   Specimen Description   Final    BLOOD RIGHT ANTECUBITAL Performed at [facility], [address] [place], [place] [address]    Special Requests   Final    BOTTLES DRAWN AEROBIC AND ANAEROBIC Blood Culture results may not be optimal due to an inadequate volume of blood received in culture bottles Performed at [facility], [address] [place], [place] [address]    Culture   Final    NO GROWTH 4 DAYS Performed at [facility], [address], [place], [place] [address]    Report Status PENDING  Incomplete  Resp panel by RT-PCR (RSV, Flu A&B, Covid) Anterior Nasal Swab     Status: None   Collection Time: 04/02/23  3:43 PM   Specimen: Anterior Nasal Swab  Result Value Ref Range Status   SARS Coronavirus 2 by RT PCR NEGATIVE NEGATIVE Final    Comment: (NOTE) SARS-CoV-2 target nucleic acids are NOT DETECTED.  The SARS-CoV-2 RNA is generally detectable in upper respiratory specimens during the acute phase of infection. The lowest concentration of SARS-CoV-2 viral copies this assay can detect is 138 copies/mL. A negative result does not preclude SARS-Cov-2 infection and should not be used as the sole basis for treatment or other patient management decisions. A negative result may occur with  improper specimen collection/handling, submission of specimen other than nasopharyngeal swab, presence of viral mutation(s) within the areas targeted by this assay, and inadequate number of viral copies(<138 copies/mL). A negative result must be combined with clinical observations, patient history, and epidemiological information. The expected result is Negative.  Fact Sheet for Patients:  BloggerCourse.com  Fact Sheet for Healthcare  Providers:  SeriousBroker.it  This test is no t yet approved or cleared by the [place] FDA and  has been authorized for detection and/or diagnosis of SARS-CoV-2 by FDA under an Emergency Use Authorization (EUA). This EUA will remain  in effect (meaning this test can be used) for the duration of the COVID-19 declaration under Section 564(b)(1) of the Act, 21 U.S.C.section 360bbb-3(b)(1), unless the authorization is terminated  or revoked sooner.       Influenza A by PCR NEGATIVE NEGATIVE Final   Influenza B by PCR NEGATIVE NEGATIVE Final    Comment: (NOTE) The  Xpert Xpress SARS-CoV-2/FLU/RSV plus assay is intended as an aid in the diagnosis of influenza from Nasopharyngeal swab specimens and should not be used as a sole basis for treatment. Nasal washings and aspirates are unacceptable for Xpert Xpress SARS-CoV-2/FLU/RSV testing.  Fact Sheet for Patients: BloggerCourse.com  Fact Sheet for Healthcare Providers: SeriousBroker.it  This test is not yet approved or cleared by the [place] FDA and has been authorized for detection and/or diagnosis of SARS-CoV-2 by FDA under an Emergency Use Authorization (EUA). This EUA will remain in effect (meaning this test can be used) for the duration of the COVID-19 declaration under Section 564(b)(1) of the Act, 21 U.S.C. section 360bbb-3(b)(1), unless the authorization is terminated or revoked.     Resp Syncytial Virus by PCR NEGATIVE NEGATIVE Final    Comment: (NOTE) Fact Sheet for Patients: BloggerCourse.com  Fact Sheet for Healthcare Providers: SeriousBroker.it  This test is not yet approved or cleared by the [place] FDA and has been authorized for detection and/or diagnosis of SARS-CoV-2 by FDA under an Emergency Use Authorization (EUA). This EUA will remain in effect (meaning this test can be  used) for the duration of the COVID-19 declaration under Section 564(b)(1) of the Act, 21 U.S.C. section 360bbb-3(b)(1), unless the authorization is terminated or revoked.  Performed at [facility], [address], [place], [place] [address]   Blood Culture (routine x 2)     Status: None (Preliminary result)   Collection Time: 04/03/23  6:52 AM   Specimen: BLOOD LEFT ARM  Result Value Ref Range Status   Specimen Description   Final    BLOOD LEFT ARM Performed at [facility], [address], [place], [place] [address]    Special Requests   Final    BOTTLES DRAWN AEROBIC AND ANAEROBIC Blood Culture results may not be optimal due to an inadequate volume of blood received in culture bottles Performed at [facility], [address] [place], [place] [address]    Culture   Final    NO GROWTH 3 DAYS Performed at [facility], [address] [place], [place] [address]    Report Status PENDING  Incomplete  Gastrointestinal Panel by PCR , Stool     Status: None   Collection Time: 04/05/23 10:00 AM   Specimen: Stool  Result Value Ref Range Status   Campylobacter species NOT DETECTED NOT DETECTED Final   Plesimonas shigelloides NOT DETECTED NOT DETECTED Final   Salmonella species NOT DETECTED NOT DETECTED Final   Yersinia enterocolitica NOT DETECTED NOT DETECTED Final   Vibrio species NOT DETECTED NOT DETECTED Final   Vibrio cholerae NOT DETECTED NOT DETECTED Final   Enteroaggregative E coli (EAEC) NOT DETECTED NOT DETECTED Final   Enteropathogenic E coli (EPEC) NOT DETECTED NOT DETECTED Final   Enterotoxigenic E coli (ETEC) NOT DETECTED NOT DETECTED Final   Shiga like toxin producing E coli (STEC) NOT DETECTED NOT DETECTED Final   Shigella/Enteroinvasive E coli (EIEC) NOT DETECTED NOT DETECTED Final   Cryptosporidium NOT DETECTED NOT DETECTED Final   Cyclospora cayetanensis NOT DETECTED NOT DETECTED Final   Entamoeba histolytica NOT  DETECTED NOT DETECTED Final   Giardia lamblia NOT DETECTED NOT DETECTED Final   Adenovirus F40/41 NOT DETECTED NOT DETECTED Final   Astrovirus NOT DETECTED NOT DETECTED Final   Norovirus GI/GII NOT DETECTED NOT DETECTED Final   Rotavirus A NOT DETECTED NOT DETECTED Final   Sapovirus (I, II, IV, and V) NOT DETECTED  NOT DETECTED Final    Comment: Performed at [facility], [address] [place], [place] [address]  C Difficile Quick Screen w PCR reflex     Status: None   Collection Time: 04/05/23 10:00 AM   Specimen: Stool  Result Value Ref Range Status   C Diff antigen NEGATIVE NEGATIVE Final   C Diff toxin NEGATIVE NEGATIVE Final   C Diff interpretation No C. difficile detected.  Final    Comment: Performed at [facility], [address], [place], [place] [address]    Labs: CBC: Recent Labs  Lab 04/02/23 1534 04/05/23 0952  WBC 4.1 5.5  NEUTROABS 2.1 2.7  HGB 10.0* 10.0*  HCT 32.9* 32.7*  MCV 80.2 78.8*  PLT 155 168   Basic Metabolic Panel: Recent Labs  Lab 04/02/23 1534 04/04/23 1056 04/05/23 0952 04/06/23 0612  NA 136 138 143 145  K 4.2 4.0 3.4* 3.7  CL 107 109 111 115*  CO2 24 21* 21* 22  GLUCOSE 94 142* 166* 125*  BUN 32* 9 7* 6*  CREATININE 1.06* 0.62 0.67 0.69  CALCIUM 8.5* 8.2* 8.5* 8.6*   Liver Function Tests: Recent Labs  Lab 04/02/23 1534  AST 22  ALT 21  ALKPHOS 47  BILITOT 0.6  PROT 5.6*  ALBUMIN 2.3*   CBG: Recent Labs  Lab 04/06/23 0419 04/06/23 0448 04/06/23 0526 04/06/23 0734 04/06/23 1213  GLUCAP 68* 72 88 117* 183*    Discharge time spent: 38 minutes  Signed: Kathlen Mody, MD [facility] 04/06/2023

## 2023-04-07 LAB — CULTURE, BLOOD (ROUTINE X 2): Culture: NO GROWTH

## 2023-04-08 LAB — CULTURE, BLOOD (ROUTINE X 2)
